# Patient Record
Sex: Female | Born: 1980 | Race: Black or African American | Hispanic: No | Marital: Married | Smoking: Current some day smoker
Health system: Southern US, Community
[De-identification: ages and names within clinical notes are randomized; demographics above are authoritative.]

## PROBLEM LIST (undated history)

## (undated) ENCOUNTER — Inpatient Hospital Stay (HOSPITAL_COMMUNITY): Payer: Self-pay

## (undated) DIAGNOSIS — F32A Depression, unspecified: Secondary | ICD-10-CM

## (undated) DIAGNOSIS — E0590 Thyrotoxicosis, unspecified without thyrotoxic crisis or storm: Secondary | ICD-10-CM

## (undated) DIAGNOSIS — D649 Anemia, unspecified: Secondary | ICD-10-CM

## (undated) DIAGNOSIS — E079 Disorder of thyroid, unspecified: Secondary | ICD-10-CM

## (undated) DIAGNOSIS — F329 Major depressive disorder, single episode, unspecified: Secondary | ICD-10-CM

## (undated) DIAGNOSIS — R519 Headache, unspecified: Secondary | ICD-10-CM

## (undated) DIAGNOSIS — E039 Hypothyroidism, unspecified: Secondary | ICD-10-CM

## (undated) DIAGNOSIS — F4310 Post-traumatic stress disorder, unspecified: Secondary | ICD-10-CM

## (undated) DIAGNOSIS — E119 Type 2 diabetes mellitus without complications: Secondary | ICD-10-CM

## (undated) DIAGNOSIS — Z87442 Personal history of urinary calculi: Secondary | ICD-10-CM

## (undated) DIAGNOSIS — O24419 Gestational diabetes mellitus in pregnancy, unspecified control: Secondary | ICD-10-CM

## (undated) HISTORY — PX: HAND SURGERY: SHX662

## (undated) HISTORY — DX: Post-traumatic stress disorder, unspecified: F43.10

## (undated) HISTORY — PX: OOPHORECTOMY: SHX86

## (undated) HISTORY — PX: DILATION AND CURETTAGE OF UTERUS: SHX78

## (undated) HISTORY — PX: SALPINGECTOMY: SHX328

---

## 2015-07-20 ENCOUNTER — Encounter (HOSPITAL_COMMUNITY): Payer: Self-pay

## 2015-07-20 ENCOUNTER — Emergency Department (HOSPITAL_COMMUNITY): Payer: Self-pay

## 2015-07-20 DIAGNOSIS — R110 Nausea: Secondary | ICD-10-CM | POA: Insufficient documentation

## 2015-07-20 DIAGNOSIS — Z8639 Personal history of other endocrine, nutritional and metabolic disease: Secondary | ICD-10-CM | POA: Insufficient documentation

## 2015-07-20 DIAGNOSIS — R0789 Other chest pain: Secondary | ICD-10-CM | POA: Insufficient documentation

## 2015-07-20 DIAGNOSIS — F17200 Nicotine dependence, unspecified, uncomplicated: Secondary | ICD-10-CM | POA: Insufficient documentation

## 2015-07-20 DIAGNOSIS — Z862 Personal history of diseases of the blood and blood-forming organs and certain disorders involving the immune mechanism: Secondary | ICD-10-CM | POA: Insufficient documentation

## 2015-07-20 LAB — BASIC METABOLIC PANEL
Anion gap: 5 (ref 5–15)
BUN: 5 mg/dL — AB (ref 6–20)
CHLORIDE: 112 mmol/L — AB (ref 101–111)
CO2: 25 mmol/L (ref 22–32)
Calcium: 11.2 mg/dL — ABNORMAL HIGH (ref 8.9–10.3)
Creatinine, Ser: 0.85 mg/dL (ref 0.44–1.00)
GFR calc Af Amer: >60 mL/min (ref >60)
GFR calc non Af Amer: >60 mL/min (ref >60)
GLUCOSE: 111 mg/dL — AB (ref 65–99)
POTASSIUM: 3.7 mmol/L (ref 3.5–5.1)
Sodium: 142 mmol/L (ref 135–145)

## 2015-07-20 LAB — CBC
HEMATOCRIT: 35.4 % — AB (ref 36.0–46.0)
HEMOGLOBIN: 11.8 g/dL — AB (ref 12.0–15.0)
MCH: 31.1 pg (ref 26.0–34.0)
MCHC: 33.3 g/dL (ref 30.0–36.0)
MCV: 93.2 fL (ref 78.0–100.0)
Platelets: 275 10*3/uL (ref 150–400)
RBC: 3.8 MIL/uL — ABNORMAL LOW (ref 3.87–5.11)
RDW: 12.5 % (ref 11.5–15.5)
WBC: 6.8 10*3/uL (ref 4.0–10.5)

## 2015-07-20 LAB — I-STAT TROPONIN, ED: Troponin i, poc: 0 ng/mL (ref 0.00–0.08)

## 2015-07-20 NOTE — ED Notes (Signed)
Pt here for central chest pain onset tonight 6pm while she was at work "packing personal items"; pain radiates to back and abdomen; pain worse with movement. Pt endorses nausea but denies vomiting. Denies SOB.

## 2015-07-20 NOTE — ED Notes (Signed)
Called for vital sign reassessment. No answer.

## 2015-07-21 ENCOUNTER — Emergency Department (HOSPITAL_COMMUNITY)
Admission: EM | Admit: 2015-07-21 | Discharge: 2015-07-21 | Disposition: A | Discharge status: Home / Self Care | Payer: Self-pay | Attending: Emergency Medicine | Admitting: Emergency Medicine

## 2015-07-21 DIAGNOSIS — M791 Myalgia, unspecified site: Secondary | ICD-10-CM

## 2015-07-21 DIAGNOSIS — R0789 Other chest pain: Secondary | ICD-10-CM

## 2015-07-21 HISTORY — DX: Anemia, unspecified: D64.9

## 2015-07-21 HISTORY — DX: Disorder of thyroid, unspecified: E07.9

## 2015-07-21 HISTORY — DX: Thyrotoxicosis, unspecified without thyrotoxic crisis or storm: E05.90

## 2015-07-21 MED ORDER — CYCLOBENZAPRINE HCL 5 MG PO TABS: 5.0000 mg | ORAL_TABLET | Freq: Three times a day (TID) | ORAL | Status: DC | PRN

## 2015-07-21 MED ORDER — CYCLOBENZAPRINE HCL 10 MG PO TABS
5.0000 mg | ORAL_TABLET | Freq: Once | ORAL | Status: AC
  Administered 2015-07-21: 5 mg via ORAL
  Filled 2015-07-21: qty 1

## 2015-07-21 MED ORDER — NAPROXEN 500 MG PO TABS: ORAL_TABLET | ORAL | Status: DC

## 2015-07-21 MED ORDER — KETOROLAC TROMETHAMINE 30 MG/ML IJ SOLN
30.0000 mg | Freq: Once | INTRAMUSCULAR | Status: AC
  Administered 2015-07-21: 30 mg via INTRAVENOUS
  Filled 2015-07-21: qty 1

## 2015-07-21 NOTE — Discharge Instructions (Signed)
Use ice and heat to the sore places. Take the medications as prescribed. You need to try to get a local doctor.  Recheck if you get a fever, vomiting or seem worse.     Emergency Department Resource Guide 1) Find a Doctor and Pay Out of Pocket Although you won't have to find out who is covered by your insurance plan, it is a good idea to ask around and get recommendations. You will then need to call the office and see if the doctor you have chosen will accept you as a new patient and what types of options they offer for patients who are self-pay. Some doctors offer discounts or will set up payment plans for their patients who do not have insurance, but you will need to ask so you aren't surprised when you get to your appointment.  2) Contact Your Local Health Department Not all health departments have doctors that can see patients for sick visits, but many do, so it is worth a call to see if yours does. If you don't know where your local health department is, you can check in your phone book. The CDC also has a tool to help you locate your state's health department, and many state websites also have listings of all of their local health departments.  3) Find a [facility] If your illness is not likely to be very severe or complicated, you may want to try a walk in clinic. These are popping up all over the country in pharmacies, drugstores, and shopping centers. They're usually staffed by nurse practitioners or physician assistants that have been trained to treat common illnesses and complaints. They're usually fairly quick and inexpensive. However, if you have serious medical issues or chronic medical problems, these are probably not your best option.  No Primary Care Doctor: - Call Health Connect at  [phone] - they can help you locate a primary care doctor that  accepts your insurance, provides certain services, etc. - Physician Referral Service- [phone]  Chronic Pain  Problems: Organization         Address  Phone   Notes  [facility]  [phone] Patients need to be referred by their primary care doctor.   Medication Assistance: Organization         Address  Phone   Notes  [facility] Medication [facility] [address] [place], [place] [address] [phone] --Must be a resident of [facility] -- Must have NO insurance coverage whatsoever (no Medicaid/ Medicare, etc.) -- The pt. MUST have a primary care doctor that directs their care regularly and follows them in the community   [facility]  [phone]   [employer]  [phone]    Agencies that provide inexpensive medical care: Organization         Address  Phone   Notes  [place]  [phone]   [facility]    [phone]   [facility] [place], [place] [address] [phone]   [place] [address] [place] [phone]   [facility]anned Parenthood    [phone]   [facility]    [phone]   [place] and [address], [place] Phone:  [phone], Fax:  [phone] Hours of Operation:  9 am - 6 pm, M-F.  Also accepts Medicaid/Medicare and self-pay.  [facility]  [address], [place] Phone: [phone], Fax: [phone]. Hours of Operation:  8:30 am - 5:30 pm, M-F.  Also accepts Medicaid and self-pay.  [facility] [address] [place] Phone: [phone]   [place], [place], [place] [phone], Ext. [phone] Mondays & Thursdays: 7-9 AM.  First 15 patients are seen on a first come, first serve basis.    [place] Providers:  Organization         Address  Phone   Notes  [facility] [address], [place] [phone] Also  accepts self-pay patients.  [facility] [address], [place]  [phone]   [place], [address], [place] [phone]   [facility] [address] [place] [phone]   Lucianne Lei [address], [place]   [phone] Only accepts [place] Access [place] patients after they have their name applied to their card.   Self-Pay (no insurance) in [facility]:  Organization         Address  Phone   Notes  Sickle Cell Patients, [facility] [place] [phone]   [facility] [place] [phone]   [facility] [place]  [place], [place], [place] [phone]   [facility]/Dr. Osei-Bonsu  [address], [place] or [address], [place] [phone] Phone number for both [place] and [place] locations is the same.  [facility] [address] [place] [phone]   [facility] [address] [place] or [address] [phone]   [facility] [address] [place] [phone], phone; [phone], fax Sees patients 1st and 3rd Saturday of every month.  Must not qualify for public or private insurance (i.e. Medicaid, Medicare, [place] Health Choice, Veterans' Benefits)  Household income should be no more than 200% of the poverty level The clinic cannot treat you if you are pregnant or think you are pregnant  Sexually transmitted diseases are not treated at the clinic.    Dental Care: Organization         Address  Phone  Notes  [facility] Department of [facility] [phone] Accepts children up to age 64 who are enrolled in [place] or [place]; pregnant  women with a Medicaid card; and children who have applied for Medicaid or [place] Health Choice, but were declined, whose parents can pay a reduced fee at time of service.  [facility] Department of [facility]  [address], [place] [phone] Accepts children up to age 74 who are enrolled in [place] or [place]; pregnant women with a Medicaid card; and children who have applied for Medicaid or [place] Health Choice, but were declined, whose parents can pay a reduced fee at time of service.  [place] [facility]  [place] [phone] Patients are seen by appointment only. Walk-ins are not accepted. [place] will see patients 14 years of age and older. Monday - Tuesday (8am-5pm) Most Wednesdays (8:30-5pm) $30 per visit, cash only  [place] [facility]  [address], [facility] [phone] Patients are seen by appointment only. Walk-ins are not accepted. [place] will see patients 26 years of age and older. One Wednesday Evening (Monthly: Volunteer Based).  $30 per visit, cash only  [place]  [phone] for adults; Children under age 34, call Graduate Pediatric Dentistry at [phone]. Children aged 82-14, please call [phone] to request a pediatric application.  Dental services are provided in all areas of dental care including fillings, crowns and bridges, complete and partial dentures, implants, gum treatment, root canals, and extractions. Preventive care is also provided. Treatment is provided to both adults and children. Patients are selected via a lottery and there is often a waiting list.   [facility] [address], [place]  [phone] [email or web address]   [facility] [address] [place], [place] [phone], Ext. [phone] Second and Fourth Thursday of each month, opens at 6:30 AM; Clinic ends at 9 AM.  Patients are  seen on a first-come first-served basis, and a limited number are seen during each clinic.   [facility]  [address] [place], [place] [phone]   Eligibility Requirements You must have lived in [place], [place], or [place] counties for at least the last three months.   You cannot be eligible for state or federal sponsored [employer], including [employer], [place], or [employer].   You generally cannot be eligible for healthcare insurance through your employer.    How to apply: Eligibility screenings are held every Tuesday and Wednesday afternoon from 1:00 pm until 4:00 pm. You do not need an appointment for the interview!  [facility] [address] [place], [place]   [place]  [place]  [place]  [phone]

## 2015-07-21 NOTE — ED Provider Notes (Signed)
CSN: [id number]     Arrival date & time 07/20/15  2006 History  By signing my name below, I, Randa Evens, attest that this documentation has been prepared under the direction and in the presence of Rolland Porter, MD at [place] [facility]. Electronically Signed: Randa Evens, ED Scribe. 07/21/2015. 2:15 AM.    Chief Complaint  Patient presents with  . Chest Pain    The history is provided by the patient. No language interpreter was used.   HPI Comments: Theresa Mccoy is a 35 y.o. female who presents to the Emergency Department complaining of constant sharp CP onset the evening of the 13th at 6:10PM that began after eating. She states she ate french fries with jalapeno peppers which is something she's eaten before although she states she normally does not eat during her break at work which is what she did tonight. Pt states that the pain radiates straight to her back and down into her abdomen and involves her whole abdomen. She states that any type of movement make the pain worse. She states that rest provides some relief. Pt reports some nausea. Pt denies any medications PTA. She denies vomiting, diarrhea, constipation, SOB or cough. She reports Hx of similar pain when having a miscarriage. She denies chance of pregnancy today. Patient is G4P2A2 (1 ectopic, one miscarriage). LMP around the first week of January. Pt reports smoking 1 pack of cigarettes each week.   Pt states that she she was prescribed, Remeron, lasix and tylenol # 5 but has not been complaint with the medications with them since October 2016. 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 She states the Lasix with for swelling. She states his medications were prescribed by her physician prior to moving to [facility] in May.  PCP none  Past Medical History  Diagnosis Date  . Thyroid disease   . Hyperthyroidism   . Anemia    Past Surgical History  Procedure Laterality Date  . Cesarean section     . Hand surgery Left    No family history on file. Social History  Substance Use Topics  . Smoking status: Current Every Day Smoker  . Smokeless tobacco: None  . Alcohol Use: Yes  employed Patient smokes 1 pack per week  OB History    No data available      Review of Systems  Respiratory: Negative for cough and shortness of breath.   Cardiovascular: Positive for chest pain.  Gastrointestinal: Positive for nausea. Negative for vomiting, diarrhea and constipation.  All other systems reviewed and are negative.    Allergies  Codeine  Home Medications   Prior to Admission medications   Medication Sig Start Date End Date Taking? Authorizing Provider  cyclobenzaprine (FLEXERIL) 5 MG tablet Take 1 tablet (5 mg total) by mouth 3 (three) times daily as needed for muscle spasms. 07/21/15   Rolland Porter, MD  naproxen (NAPROSYN) 500 MG tablet Take 1 po BID with food prn pain 07/21/15   Rolland Porter, MD   ED Triage Vitals  Enc Vitals Group     BP 07/20/15 2018 148/108 mmHg     Pulse Rate 07/20/15 2018 69     Resp 07/20/15 2018 16     Temp 07/20/15 2018 98.1 F (36.7 C)     Temp Source 07/20/15 2018 Oral     SpO2 07/20/15 2018 99 %     Weight --      Height --  Head Cir --      Peak Flow --      Pain Score 07/20/15 2014 10     Pain Loc --      Pain Edu? --      Excl. in [place]? --      Vital signs normal except for diastolic hypertension    Physical Exam  Constitutional: She is oriented to person, place, and time. She appears well-developed and well-nourished.  Non-toxic appearance. She does not appear ill. No distress.  HENT:  Head: Normocephalic and atraumatic.  Right Ear: External ear normal.  Left Ear: External ear normal.  Nose: Nose normal. No mucosal edema or rhinorrhea.  Mouth/Throat: Oropharynx is clear and moist and mucous membranes are normal. No dental abscesses or uvula swelling.  Eyes: Conjunctivae and EOM are normal. Pupils are equal, round, and reactive to  light.  Neck: Normal range of motion and full passive range of motion without pain. Neck supple.  Cardiovascular: Normal rate, regular rhythm and normal heart sounds.  Exam reveals no gallop and no friction rub.   No murmur heard. Pulmonary/Chest: Effort normal and breath sounds normal. No respiratory distress. She has no wheezes. She has no rhonchi. She has no rales. She exhibits no tenderness and no crepitus.  Abdominal: Soft. Normal appearance and bowel sounds are normal. She exhibits no distension. There is no tenderness. There is no rebound and no guarding.  Musculoskeletal: Normal range of motion. She exhibits no edema or tenderness.  Moves all extremities well.   Neurological: She is alert and oriented to person, place, and time. She has normal strength. No cranial nerve deficit.  Skin: Skin is warm, dry and intact. No rash noted. No erythema. No pallor.  Psychiatric: She has a normal mood and affect. Her speech is normal and behavior is normal. Her mood appears not anxious.  Nursing note and vitals reviewed.   ED Course  Procedures (including critical care time)  Medications  ketorolac (TORADOL) 30 MG/ML injection 30 mg (30 mg Intravenous Given 07/21/15 0228)  cyclobenzaprine (FLEXERIL) tablet 5 mg (5 mg Oral Given 07/21/15 0228)    DIAGNOSTIC STUDIES: Oxygen Saturation is 99% on RA, normal by my interpretation.    COORDINATION OF CARE: 2:14 AM-Discussed treatment plan with pt at bedside and pt agreed to plan.   Patient was given IV Toradol and oral Flexeril for her pain. She slept during the rest of her ED visit.  I waking the patient at time of discharge. She has no pain to palpation of her abdomen. Her chest is mildly tender. She states she only has pain when she moves. She has no pain when she's lying still.  Labs Review Results for orders placed or performed during the hospital encounter of [id number]  Basic metabolic panel  Result Value Ref Range   Sodium 142 135 - 145  mmol/L   Potassium 3.7 3.5 - 5.1 mmol/L   Chloride 112 (H) 101 - 111 mmol/L   CO2 25 22 - 32 mmol/L   Glucose, Bld 111 (H) 65 - 99 mg/dL   BUN 5 (L) 6 - 20 mg/dL   Creatinine, Ser 0.85 0.44 - 1.00 mg/dL   Calcium 11.2 (H) 8.9 - 10.3 mg/dL   GFR calc non Af Amer >60 >60 mL/min   GFR calc Af Amer >60 >60 mL/min   Anion gap 5 5 - 15  CBC  Result Value Ref Range   WBC 6.8 4.0 - 10.5 K/uL   RBC 3.80 (L)  3.87 - 5.11 MIL/uL   Hemoglobin 11.8 (L) 12.0 - 15.0 g/dL   HCT 35.4 (L) 36.0 - 46.0 %   MCV 93.2 78.0 - 100.0 fL   MCH 31.1 26.0 - 34.0 pg   MCHC 33.3 30.0 - 36.0 g/dL   RDW 12.5 11.5 - 15.5 %   Platelets 275 150 - 400 K/uL  I-stat troponin, ED (not at [facility], [facility])  Result Value Ref Range   Troponin i, poc 0.00 0.00 - 0.08 ng/mL   Comment 3           Laboratory interpretation all normal except mild anemia     Imaging Review Dg Chest 2 View  07/20/2015  CLINICAL DATA:  Chest pain and nausea EXAM: CHEST  2 VIEW COMPARISON:  None. FINDINGS: Normal mediastinum and cardiac silhouette. Normal pulmonary vasculature. No evidence of effusion, infiltrate, or pneumothorax. No acute bony abnormality. IMPRESSION: Normal chest radiograph Electronically Signed   By: Suzy Bouchard M.D.   On: 07/20/2015 21:22   I have personally reviewed and evaluated these images and lab results as part of my medical decision-making.   EKG Interpretation   Date/Time:  Friday July 20 2015 20:19:28 EST Ventricular Rate:  70 PR Interval:  150 QRS Duration: 80 QT Interval:  368 QTC Calculation: 397 R Axis:   66 Text Interpretation:  Normal sinus rhythm with sinus arrhythmia Normal ECG  No old tracing to compare Confirmed by Kemi Gell  MD-I, Abdulkarim Eberlin ([id number]) on  07/21/2015 2:16:12 AM      MDM   Final diagnoses:  Muscle pain  Musculoskeletal chest pain    Plan discharge  Rolland Porter, MD, FACEP   I personally performed the services described in this documentation, which was scribed in my presence. The  recorded information has been reviewed and considered.  Rolland Porter, MD, Barbette Or, MD 07/21/15 [phone]

## 2015-07-21 NOTE — ED Notes (Signed)
The pt reports no pain at present

## 2015-07-21 NOTE — ED Notes (Signed)
Pt sleeping. 

## 2015-07-21 NOTE — ED Notes (Signed)
Pt still sleeping...

## 2016-11-09 ENCOUNTER — Encounter (HOSPITAL_COMMUNITY): Payer: Self-pay

## 2016-11-09 ENCOUNTER — Emergency Department (HOSPITAL_COMMUNITY)
Admission: EM | Admit: 2016-11-09 | Discharge: 2016-11-09 | Disposition: A | Discharge status: Home / Self Care | Payer: Self-pay | Attending: Emergency Medicine | Admitting: Emergency Medicine

## 2016-11-09 DIAGNOSIS — F17210 Nicotine dependence, cigarettes, uncomplicated: Secondary | ICD-10-CM | POA: Insufficient documentation

## 2016-11-09 DIAGNOSIS — Z79899 Other long term (current) drug therapy: Secondary | ICD-10-CM | POA: Insufficient documentation

## 2016-11-09 DIAGNOSIS — R21 Rash and other nonspecific skin eruption: Secondary | ICD-10-CM | POA: Insufficient documentation

## 2016-11-09 LAB — POC URINE PREG, ED: Preg Test, Ur: NEGATIVE

## 2016-11-09 MED ORDER — DIPHENHYDRAMINE HCL 25 MG PO CAPS
50.0000 mg | ORAL_CAPSULE | Freq: Once | ORAL | Status: AC
  Administered 2016-11-09: 50 mg via ORAL
  Filled 2016-11-09: qty 2

## 2016-11-09 MED ORDER — FLUCONAZOLE 100 MG PO TABS
300.0000 mg | ORAL_TABLET | Freq: Once | ORAL | Status: AC
  Administered 2016-11-09: 300 mg via ORAL
  Filled 2016-11-09: qty 3

## 2016-11-09 MED ORDER — FLUCONAZOLE 200 MG PO TABS: 300.0000 mg | ORAL_TABLET | Freq: Once | ORAL | 0 refills | Status: AC

## 2016-11-09 MED ORDER — IBUPROFEN 400 MG PO TABS
600.0000 mg | ORAL_TABLET | Freq: Once | ORAL | Status: AC
Start: 2016-11-09 — End: 2016-11-09
  Administered 2016-11-09: 600 mg via ORAL
  Filled 2016-11-09: qty 1

## 2016-11-09 NOTE — ED Triage Notes (Signed)
Onset 5 months abd, leg, fet pain.  Legs will lock up sometimes while pt is walking.  Onset "years" widespread itchy rash, brown spots and when pt scratches skin will peel.

## 2016-11-09 NOTE — ED Notes (Signed)
Patient urinated right before preg test was ordered.  Drinking water at this time.

## 2016-11-10 NOTE — ED Provider Notes (Signed)
[facility] DEPT Provider Note   CSN: [id number] Arrival date & time: 11/09/16  9449     History   Chief Complaint Chief Complaint  Patient presents with  . generalized pain  . Rash    HPI Theresa Mccoy is a 36 y.o. female.  The history is provided by the patient.  Rash   This is a chronic problem. Episode onset: worsening over the past year. The problem has been gradually worsening. The problem is associated with nothing. There has been no fever. The rash is present on the torso (legs and arms). The patient is experiencing no pain. The pain has been constant since onset. Associated symptoms include itching. She has tried steriods for the symptoms. The treatment provided no relief.    Past Medical History:  Diagnosis Date  . Anemia   . Hyperthyroidism   . Thyroid disease     There are no active problems to display for this patient.   Past Surgical History:  Procedure Laterality Date  . CESAREAN SECTION    . HAND SURGERY Left     OB History    No data available       Home Medications    Prior to Admission medications   Medication Sig Start Date End Date Taking? Authorizing Provider  acetaminophen (TYLENOL) 500 MG tablet Take 500 mg by mouth 2 (two) times daily.   Yes [provider]  atenolol (TENORMIN) 50 MG tablet Take 50 mg by mouth daily.   Yes [provider]  Calcium Polycarbophil (FIBER) 625 MG TABS Take 625 mg by mouth daily.   Yes [provider]  loratadine (CLARITIN) 10 MG tablet Take 10 mg by mouth daily.   Yes [provider]  meloxicam (MOBIC) 7.5 MG tablet Take 7.5 mg by mouth 2 (two) times daily.   Yes [provider]  senna (SENOKOT) 8.6 MG TABS tablet Take 1 tablet by mouth daily.   Yes [provider]  cyclobenzaprine (FLEXERIL) 5 MG tablet Take 1 tablet (5 mg total) by mouth 3 (three) times daily as needed for muscle spasms. Patient not taking: Reported on 11/09/2016 07/21/15   Rolland Porter, MD  fluconazole (DIFLUCAN) 200 MG tablet Take 1.5 tablets (300 mg total) by mouth once. Take 1 week after first dose if not improved 11/16/16 11/16/16  Heriberto Antigua, MD  naproxen (NAPROSYN) 500 MG tablet Take 1 po BID with food prn pain Patient not taking: Reported on 11/09/2016 07/21/15   Rolland Porter, MD    Family History History reviewed. No pertinent family history.  Social History Social History  Substance Use Topics  . Smoking status: Current Every Day Smoker    Packs/day: 0.10    Types: Cigarettes  . Smokeless tobacco: Never Used  . 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  HENT: Negative.   Respiratory: Negative for cough and shortness of breath.   Cardiovascular: Negative for chest pain and leg swelling.  Gastrointestinal: Negative for diarrhea, nausea and vomiting.       Diffuse abdominal pain, leg pain, and feet pain for about 5 months  Genitourinary: Negative.   Musculoskeletal: Positive for back pain.  Skin: Positive for itching and rash.  Neurological: Negative.   All other systems reviewed and are negative.    Physical Exam Updated Vital Signs BP (!) 145/95   Pulse 67  Temp 98.7 F (37.1 C) (Oral)   Resp (!) 23   LMP 10/12/2016   SpO2 100%   Physical Exam  Constitutional: She is oriented to person, place, and time. She appears well-developed and well-nourished. No distress.  HENT:  Head: Normocephalic and atraumatic.  Eyes: Conjunctivae and EOM are normal.  Neck: Neck supple.  Cardiovascular: Normal rate and regular rhythm.   No murmur heard. Pulmonary/Chest: Effort normal and breath sounds normal. No respiratory distress.  Abdominal: Soft. She exhibits no distension. There is no tenderness. There is no rebound and no guarding.  Musculoskeletal: Normal range of motion. She exhibits no edema.  Neurological: She is alert and oriented to  person, place, and time. No sensory deficit. Coordination normal.  Skin: Skin is warm and dry. Rash (diffuse rash with some peeling and color change patches) noted.  Psychiatric: She has a normal mood and affect.  Nursing note and vitals reviewed.    ED Treatments / Results  Labs (all labs ordered are listed, but only abnormal results are displayed) Labs Reviewed  POC URINE PREG, ED    EKG  EKG Interpretation None       Radiology No results found.  Procedures Procedures (including critical care time)  Medications Ordered in ED Medications  ibuprofen (ADVIL,MOTRIN) tablet 600 mg (600 mg Oral Given 11/09/16 2029)  diphenhydrAMINE (BENADRYL) capsule 50 mg (50 mg Oral Given 11/09/16 2029)  fluconazole (DIFLUCAN) tablet 300 mg (300 mg Oral Given 11/09/16 2029)     Initial Impression / Assessment and Plan / ED Course  I have reviewed the triage vital signs and the nursing notes.  Pertinent labs & imaging results that were available during my care of the patient were reviewed by me and considered in my medical decision making (see chart for details).     Patient is a 36 year old female with no significant past medical history presents with skin burning, itching, rash that has been worsening over the past year as well as generalized pain. Further history and exam as above with reassuring vital signs and rash diffusely which appears to be consistent with tinea versicolor. No significant traumatic findings or signs of trauma. Pregnancy test negative. Given a dose of fluconazole here so symptomatic management.  Patient stable for discharge home.  I have reviewed all results with the patient. Advised to f/u with pcp for further management. Will rx fluconazole for 1 week if symptoms not improved. Patient agrees to stated plan. All questions answered. Advised to call or return to have any questions, new symptoms, change in symptoms, or symptoms that they do not understand.   Final Clinical  Impressions(s) / ED Diagnoses   Final diagnoses:  Rash    New Prescriptions Discharge Medication List as of 11/09/2016  9:19 PM    START taking these medications   Details  fluconazole (DIFLUCAN) 200 MG tablet Take 1.5 tablets (300 mg total) by mouth once. Take 1 week after first dose if not improved, Starting Sun 11/16/2016, Print         Heriberto Antigua, MD 11/10/16 1603    Elnora Morrison, MD 11/16/16 [phone]

## 2016-12-08 ENCOUNTER — Inpatient Hospital Stay (INDEPENDENT_AMBULATORY_CARE_PROVIDER_SITE_OTHER): Payer: Self-pay | Admitting: Physician Assistant

## 2017-01-08 ENCOUNTER — Emergency Department (HOSPITAL_COMMUNITY)
Admission: EM | Admit: 2017-01-08 | Discharge: 2017-01-08 | Disposition: A | Discharge status: Home / Self Care | Payer: Medicaid Other | Attending: Emergency Medicine | Admitting: Emergency Medicine

## 2017-01-08 DIAGNOSIS — R110 Nausea: Secondary | ICD-10-CM | POA: Insufficient documentation

## 2017-01-08 DIAGNOSIS — E039 Hypothyroidism, unspecified: Secondary | ICD-10-CM | POA: Diagnosis not present

## 2017-01-08 DIAGNOSIS — N941 Unspecified dyspareunia: Secondary | ICD-10-CM | POA: Insufficient documentation

## 2017-01-08 DIAGNOSIS — Z79899 Other long term (current) drug therapy: Secondary | ICD-10-CM | POA: Diagnosis not present

## 2017-01-08 DIAGNOSIS — R109 Unspecified abdominal pain: Secondary | ICD-10-CM | POA: Insufficient documentation

## 2017-01-08 DIAGNOSIS — F17210 Nicotine dependence, cigarettes, uncomplicated: Secondary | ICD-10-CM | POA: Insufficient documentation

## 2017-01-08 DIAGNOSIS — R200 Anesthesia of skin: Secondary | ICD-10-CM | POA: Insufficient documentation

## 2017-01-08 DIAGNOSIS — I10 Essential (primary) hypertension: Secondary | ICD-10-CM | POA: Diagnosis not present

## 2017-01-08 DIAGNOSIS — G8929 Other chronic pain: Secondary | ICD-10-CM | POA: Diagnosis not present

## 2017-01-08 DIAGNOSIS — M545 Low back pain: Secondary | ICD-10-CM | POA: Diagnosis present

## 2017-01-08 LAB — COMPREHENSIVE METABOLIC PANEL
ALT: 10 U/L — ABNORMAL LOW (ref 14–54)
ANION GAP: 4 — AB (ref 5–15)
AST: 14 U/L — ABNORMAL LOW (ref 15–41)
Albumin: 4.6 g/dL (ref 3.5–5.0)
Alkaline Phosphatase: 88 U/L (ref 38–126)
BUN: 8 mg/dL (ref 6–20)
CALCIUM: 11.5 mg/dL — AB (ref 8.9–10.3)
CO2: 22 mmol/L (ref 22–32)
Chloride: 110 mmol/L (ref 101–111)
Creatinine, Ser: 0.7 mg/dL (ref 0.44–1.00)
Glucose, Bld: 102 mg/dL — ABNORMAL HIGH (ref 65–99)
POTASSIUM: 3.7 mmol/L (ref 3.5–5.1)
Sodium: 136 mmol/L (ref 135–145)
TOTAL PROTEIN: 7.7 g/dL (ref 6.5–8.1)
Total Bilirubin: 0.4 mg/dL (ref 0.3–1.2)

## 2017-01-08 LAB — URINALYSIS, ROUTINE W REFLEX MICROSCOPIC
BILIRUBIN URINE: NEGATIVE
GLUCOSE, UA: NEGATIVE mg/dL
Ketones, ur: NEGATIVE mg/dL
LEUKOCYTES UA: NEGATIVE
NITRITE: NEGATIVE
PROTEIN: NEGATIVE mg/dL
Specific Gravity, Urine: 1.01 (ref 1.005–1.030)
pH: 6 (ref 5.0–8.0)

## 2017-01-08 LAB — CBC
HEMATOCRIT: 36.9 % (ref 36.0–46.0)
HEMOGLOBIN: 12.6 g/dL (ref 12.0–15.0)
MCH: 31.3 pg (ref 26.0–34.0)
MCHC: 34.1 g/dL (ref 30.0–36.0)
MCV: 91.6 fL (ref 78.0–100.0)
Platelets: 271 10*3/uL (ref 150–400)
RBC: 4.03 MIL/uL (ref 3.87–5.11)
RDW: 12.5 % (ref 11.5–15.5)
WBC: 10.2 10*3/uL (ref 4.0–10.5)

## 2017-01-08 LAB — WET PREP, GENITAL
CLUE CELLS WET PREP: NONE SEEN
SPERM: NONE SEEN
Trich, Wet Prep: NONE SEEN
WBC WET PREP: NONE SEEN
Yeast Wet Prep HPF POC: NONE SEEN

## 2017-01-08 LAB — I-STAT BETA HCG BLOOD, ED (MC, WL, AP ONLY): I-stat hCG, quantitative: <5 m[IU]/mL (ref <5)

## 2017-01-08 LAB — LIPASE, BLOOD: Lipase: 22 U/L (ref 11–51)

## 2017-01-08 NOTE — ED Provider Notes (Addendum)
[facility] DEPT Provider Note   CSN: [id number] Arrival date & time: 01/08/17  1155     History   Chief Complaint Chief Complaint  Patient presents with  . Abdominal Pain  . Back Pain    HPI Theresa Mccoy is a 36 y.o. female.Complaining of lower abdominal pain for 5 months, unchanged today. Pain is nonradiating. Also complains of low back pain. No treatment prior to coming here. Nothing makes symptoms better or worse. She reports irregular menses. Her last missed her period was "pink" she denies fever denies change in appetite. Nothing makes symptoms better or worse. No treatment prior to coming here. Denies nausea or vomiting. Other associated symptoms include tingling in her feet when she stands for long time  HPI  Past Medical History:  Diagnosis Date  . Anemia   . Hyperthyroidism   . Thyroid disease   Hypertension Acid reflux There are no active problems to display for this patient.   Past Surgical History:  Procedure Laterality Date  . CESAREAN SECTION    . HAND SURGERY Left     OB History    No data available       Home Medications    Prior to Admission medications   Medication Sig Start Date End Date Taking? Authorizing Provider  loratadine (CLARITIN) 10 MG tablet Take 10 mg by mouth daily.   Yes [provider]  atenolol (TENORMIN) 50 MG tablet Take 50 mg by mouth daily.    [provider]  cyclobenzaprine (FLEXERIL) 5 MG tablet Take 1 tablet (5 mg total) by mouth 3 (three) times daily as needed for muscle spasms. Patient not taking: Reported on 11/09/2016 07/21/15   Rolland Porter, MD  meloxicam (MOBIC) 7.5 MG tablet Take 7.5 mg by mouth 2 (two) times daily.    [provider]    Family History No family history on file.  Social History Social History  Substance Use Topics  . Smoking status: Current Every Day Smoker    Packs/day: 0.10    Types: Cigarettes  . Smokeless tobacco: Never Used  . Alcohol use No     Comment:  sober since 02-2016     Allergies   Codeine   Review of Systems Review of Systems  Constitutional: Negative.   HENT: Negative.   Respiratory: Negative.   Cardiovascular: Negative.   Gastrointestinal: Positive for abdominal pain.  Genitourinary: Positive for dyspareunia. Negative for vaginal discharge.  Musculoskeletal: Positive for back pain.  Skin: Negative.   Neurological: Negative.          parasthsias in feet  Psychiatric/Behavioral: Negative.   All other systems reviewed and are negative.    Physical Exam Updated Vital Signs BP 112/81 (BP Location: Right Arm)   Pulse 74   Temp 98.1 F (36.7 C) (Oral)   Resp 16   LMP 12/31/2016   SpO2 100%   Physical Exam  Constitutional: She appears well-developed and well-nourished.  HENT:  Head: Normocephalic and atraumatic.  Eyes: Conjunctivae are normal. Pupils are equal, round, and reactive to light.  Neck: Neck supple. No tracheal deviation present. No thyromegaly present.  Cardiovascular: Normal rate and regular rhythm.   No murmur heard. Pulmonary/Chest: Effort normal and breath sounds normal.  Abdominal: Soft. Bowel sounds are normal. She exhibits no distension. There is no tenderness.  Genitourinary:  Genitourinary Comments: No external lesion no discharge in vault Pelvic exam no cervical motion tenderness. Cervical os closed. No adnexal tenderness or masses. She is mildly tenderat uterine fundus  Musculoskeletal: Normal range of motion. She exhibits no edema or tenderness.  Neurological: She is alert. Coordination normal.  Skin: Skin is warm and dry. No rash noted.  Psychiatric: She has a normal mood and affect.  Nursing note and vitals reviewed.    ED Treatments / Results  Labs (all labs ordered are listed, but only abnormal results are displayed) Labs Reviewed  COMPREHENSIVE METABOLIC PANEL - Abnormal; Notable for the following:       Result Value   Glucose, Bld 102 (*)    Calcium 11.5 (*)    AST 14 (*)     ALT 10 (*)    Anion gap 4 (*)    All other components within normal limits  URINALYSIS, ROUTINE W REFLEX MICROSCOPIC - Abnormal; Notable for the following:    Hgb urine dipstick SMALL (*)    Bacteria, UA FEW (*)    Squamous Epithelial / LPF 0-5 (*)    All other components within normal limits  WET PREP, GENITAL  LIPASE, BLOOD  CBC  RPR  HIV ANTIBODY (ROUTINE TESTING)  I-STAT BETA HCG BLOOD, ED ([facility], [facility], [facility] ONLY)  GC/CHLAMYDIA PROBE AMP ([facility]) NOT AT [facility]    EKG  EKG Interpretation None      Results for orders placed or performed during the hospital encounter of 01/08/17  Wet prep, genital  Result Value Ref Range   Yeast Wet Prep HPF POC NONE SEEN NONE SEEN   Trich, Wet Prep NONE SEEN NONE SEEN   Clue Cells Wet Prep HPF POC NONE SEEN NONE SEEN   WBC, Wet Prep HPF POC NONE SEEN NONE SEEN   Sperm NONE SEEN   Lipase, blood  Result Value Ref Range   Lipase 22 11 - 51 U/L  Comprehensive metabolic panel  Result Value Ref Range   Sodium 136 135 - 145 mmol/L   Potassium 3.7 3.5 - 5.1 mmol/L   Chloride 110 101 - 111 mmol/L   CO2 22 22 - 32 mmol/L   Glucose, Bld 102 (H) 65 - 99 mg/dL   BUN 8 6 - 20 mg/dL   Creatinine, Ser 0.70 0.44 - 1.00 mg/dL   Calcium 11.5 (H) 8.9 - 10.3 mg/dL   Total Protein 7.7 6.5 - 8.1 g/dL   Albumin 4.6 3.5 - 5.0 g/dL   AST 14 (L) 15 - 41 U/L   ALT 10 (L) 14 - 54 U/L   Alkaline Phosphatase 88 38 - 126 U/L   Total Bilirubin 0.4 0.3 - 1.2 mg/dL   GFR calc non Af Amer >60 >60 mL/min   GFR calc Af Amer >60 >60 mL/min   Anion gap 4 (L) 5 - 15  CBC  Result Value Ref Range   WBC 10.2 4.0 - 10.5 K/uL   RBC 4.03 3.87 - 5.11 MIL/uL   Hemoglobin 12.6 12.0 - 15.0 g/dL   HCT 36.9 36.0 - 46.0 %   MCV 91.6 78.0 - 100.0 fL   MCH 31.3 26.0 - 34.0 pg   MCHC 34.1 30.0 - 36.0 g/dL   RDW 12.5 11.5 - 15.5 %   Platelets 271 150 - 400 K/uL  Urinalysis, Routine w reflex microscopic  Result Value Ref Range   Color, Urine YELLOW YELLOW   APPearance CLEAR  CLEAR   Specific Gravity, Urine 1.010 1.005 - 1.030   pH 6.0 5.0 - 8.0   Glucose, UA NEGATIVE NEGATIVE mg/dL   Hgb urine dipstick SMALL (A) NEGATIVE   Bilirubin Urine NEGATIVE NEGATIVE  Ketones, ur NEGATIVE NEGATIVE mg/dL   Protein, ur NEGATIVE NEGATIVE mg/dL   Nitrite NEGATIVE NEGATIVE   Leukocytes, UA NEGATIVE NEGATIVE   RBC / HPF 0-5 0 - 5 RBC/hpf   WBC, UA 0-5 0 - 5 WBC/hpf   Bacteria, UA FEW (A) NONE SEEN   Squamous Epithelial / LPF 0-5 (A) NONE SEEN   Mucous PRESENT   I-Stat beta hCG blood, ED  Result Value Ref Range   I-stat hCG, quantitative <5.0 <5 mIU/mL   Comment 3           No results found.  Radiology No results found.  Procedures Procedures (including critical care time)  Medications Ordered in ED Medications - No data to display   Initial Impression / Assessment and Plan / ED Course  I have reviewed the triage vital signs and the nursing notes.  Pertinent labs & imaging results that were available during my care of the patient were reviewed by me and considered in my medical decision making (see chart for details).     415  Resting comfortably. No distress. Plan Tylenol or Advil for pain.STD panel pending Referral women's outpatient clinic. Safe sex encouraged Final Clinical Impressions(s) / ED Diagnoses  Diagnosis #1 chronic abdominal pain #2 chronic back pain Final diagnoses:  None    New Prescriptions New Prescriptions   No medications on file     Orlie Dakin, MD 01/08/17 1635    Orlie Dakin, MD 01/08/17 [phone]

## 2017-01-08 NOTE — ED Triage Notes (Signed)
Pt c/o diffuse abdominal discomfort, low back pain, intermitted nausea x 1 month, not improving. Pt adds feet get numb and tingly when pt stands for long period of time x 5 months.  Pt had unusually light period with mild light pink bleeding in June, menstrual period prior to that in April, lasted 1 day. Irregular periods since August 2017 Last normal menstrual period August 2017, pt's normal menstruation involves heavy bleeding with clots, lasting 10 days, occurring every 14 days.

## 2017-01-08 NOTE — Discharge Instructions (Signed)
Call the [facility] to schedule the next available appointment. Take Tylenol or Advil for pain. They will be able to check you for cancer, and help determine the cause of your abnormal periods. Use a condom each time that you have sex. You have been tested for HIV and other sexually transmitted diseases. You will be called if those tests are abnormal

## 2017-01-09 LAB — RPR: RPR Ser Ql: NONREACTIVE

## 2017-01-09 LAB — GC/CHLAMYDIA PROBE AMP (~~LOC~~) NOT AT ARMC
Chlamydia: NEGATIVE
NEISSERIA GONORRHEA: NEGATIVE

## 2017-01-09 LAB — HIV ANTIBODY (ROUTINE TESTING W REFLEX): HIV Screen 4th Generation wRfx: NONREACTIVE

## 2017-01-28 ENCOUNTER — Inpatient Hospital Stay (INDEPENDENT_AMBULATORY_CARE_PROVIDER_SITE_OTHER): Payer: Self-pay | Admitting: Physician Assistant

## 2017-02-02 ENCOUNTER — Encounter (INDEPENDENT_AMBULATORY_CARE_PROVIDER_SITE_OTHER): Payer: Self-pay | Admitting: Internal Medicine

## 2017-02-02 ENCOUNTER — Ambulatory Visit (INDEPENDENT_AMBULATORY_CARE_PROVIDER_SITE_OTHER): Payer: Self-pay | Admitting: Internal Medicine

## 2017-02-02 VITALS — BP 130/86 | HR 68 | Temp 98.2°F | Ht 60.63 in | Wt 155.2 lb

## 2017-02-02 DIAGNOSIS — N342 Other urethritis: Secondary | ICD-10-CM

## 2017-02-02 DIAGNOSIS — I10 Essential (primary) hypertension: Secondary | ICD-10-CM

## 2017-02-02 DIAGNOSIS — R1084 Generalized abdominal pain: Secondary | ICD-10-CM | POA: Insufficient documentation

## 2017-02-02 DIAGNOSIS — R252 Cramp and spasm: Secondary | ICD-10-CM | POA: Insufficient documentation

## 2017-02-02 DIAGNOSIS — F3162 Bipolar disorder, current episode mixed, moderate: Secondary | ICD-10-CM

## 2017-02-02 DIAGNOSIS — F411 Generalized anxiety disorder: Secondary | ICD-10-CM

## 2017-02-02 HISTORY — DX: Essential (primary) hypertension: I10

## 2017-02-02 LAB — POCT GLYCOSYLATED HEMOGLOBIN (HGB A1C): HEMOGLOBIN A1C: 5.3

## 2017-02-02 MED ORDER — MIRTAZAPINE 30 MG PO TABS: 30.0000 mg | ORAL_TABLET | Freq: Every day | ORAL | 3 refills | Status: DC

## 2017-02-02 MED ORDER — CIPROFLOXACIN HCL 500 MG PO TABS: 500.0000 mg | ORAL_TABLET | Freq: Two times a day (BID) | ORAL | 0 refills | Status: DC

## 2017-02-02 MED ORDER — CYCLOBENZAPRINE HCL 5 MG PO TABS: 5.0000 mg | ORAL_TABLET | Freq: Three times a day (TID) | ORAL | 2 refills | Status: DC | PRN

## 2017-02-02 MED ORDER — CYCLOBENZAPRINE HCL 5 MG PO TABS
5.0000 mg | ORAL_TABLET | Freq: Three times a day (TID) | ORAL | 2 refills | Status: DC | PRN
Start: 2017-02-02 — End: 2017-08-07

## 2017-02-02 MED ORDER — HYDROXYZINE HCL 25 MG PO TABS: 25.0000 mg | ORAL_TABLET | Freq: Three times a day (TID) | ORAL | 3 refills | Status: DC | PRN

## 2017-02-02 MED FILL — ?MIRTAZAPINE 30 MG TABLET: 30 | 30 days supply | Qty: 30 | Fill #0

## 2017-02-02 MED FILL — CIPROFLOXACIN HCL 500 MG TA: 500 | 7 days supply | Qty: 14 | Fill #0

## 2017-02-02 MED FILL — hydrOXYzine HCL 25 MG TABS: 25 | 30 days supply | Qty: 90 | Fill #0

## 2017-02-02 MED FILL — CYCLOBENZAPRINE 5 MG TABLET: 5 | 10 days supply | Qty: 30 | Fill #0

## 2017-02-02 NOTE — Progress Notes (Signed)
Theresa Mccoy, is a 36 y.o. female  JEH:[id number]  [id number]:[id number]  DOB - [birth year]  CC:  Chief Complaint  Patient presents with  . Hospitalization Follow-up    abdominal pain       HPI: Theresa Mccoy is a 36 y.o. female here today to establish medical care. She has history of anxiety and depression. She used to live in [place], recently relocated to [place], yet to establish care with psychiatrist or mental health dept here. She claims she use to see multiple medical specialists in [place] but we have no record at the moment. Patient does not know what medications she takes and has none at this time. She was seen in the ED recently for lower abdominal pain which she said was ongoing for about 5 months. She also complained of ongoing back pain. She claims her abdomen gets big and hard when she stands up but soft when lying down. She has no problem eating, she has no change in bowel habit, she agrees to some change in urine color and odor, but denies dysuria or frequency. Denies nausea or vomiting. She claims her depression is better now but she is anxious most of the time, no panic attack. She denies any suicidal ideation or thoughts. Patient has No headache, No chest pain, No new weakness tingling or numbness, No Cough - SOB.   Allergies  Allergen Reactions  . Codeine     "it makes me not be able to go to the bathroom on my own"   Past Medical History:  Diagnosis Date  . Anemia   . Hyperthyroidism   . Thyroid disease    Current Outpatient Prescriptions on File Prior to Visit  Medication Sig Dispense Refill  . atenolol (TENORMIN) 50 MG tablet Take 50 mg by mouth daily.    Marland Kitchen loratadine (CLARITIN) 10 MG tablet Take 10 mg by mouth daily.    . meloxicam (MOBIC) 7.5 MG tablet Take 7.5 mg by mouth 2 (two) times daily.     No current facility-administered medications on file prior to visit.    No family history on file. Social History   Social History  . Marital status:  Single    Spouse name: N/A  . Number of children: N/A  . Years of education: N/A   Occupational History  . Not on file.   Social History Main Topics  . Smoking status: Current Every Day Smoker    Packs/day: 0.10    Types: Cigarettes  . Smokeless tobacco: Never Used  . Alcohol use No     Comment: sober since 02-2016  . Drug use: Yes    Types: Marijuana  . Sexual activity: No   Other Topics Concern  . Not on file   Social History Narrative  . No narrative on file    Review of Systems: Constitutional: Negative for fever, chills, diaphoresis, activity change, appetite change and fatigue. HENT: Negative for ear pain, nosebleeds, congestion, facial swelling, rhinorrhea, neck pain, neck stiffness and ear discharge.  Eyes: Negative for pain, discharge, redness, itching and visual disturbance. Respiratory: Negative for cough, choking, chest tightness, shortness of breath, wheezing and stridor.  Cardiovascular: Negative for chest pain, palpitations and leg swelling. Gastrointestinal: Negative for abdominal distention. Genitourinary: Negative for dysuria, urgency, frequency, hematuria, flank pain, decreased urine volume, difficulty urinating and dyspareunia.  Musculoskeletal: Negative for back pain, joint swelling, arthralgia and gait problem. Neurological: Negative for dizziness, tremors, seizures, syncope, facial asymmetry, speech difficulty, weakness, light-headedness, numbness and headaches.  Hematological:  Negative for adenopathy. Does not bruise/bleed easily. Psychiatric/Behavioral: Negative for hallucinations, behavioral problems, confusion, dysphoric mood, decreased concentration and agitation.    Objective:   Vitals:   02/02/17 1404  BP: 130/86  Pulse: 68  Temp: 98.2 F (36.8 C)    Physical Exam: Constitutional: Patient appears well-developed and well-nourished. No distress. Poor Dentition HENT: Normocephalic, atraumatic, External right and left ear normal. Oropharynx  is clear and moist.  Eyes: Conjunctivae and EOM are normal. PERRLA, no scleral icterus. Neck: Normal ROM. Neck supple. No JVD. No tracheal deviation. No thyromegaly. CVS: RRR, S1/S2 +, no murmurs, no gallops, no carotid bruit.  Pulmonary: Effort and breath sounds normal, no stridor, rhonchi, wheezes, rales.  Abdominal: Soft. BS +, no distension, tenderness, rebound or guarding.  Musculoskeletal: Normal range of motion. No edema and no tenderness.  Lymphadenopathy: No lymphadenopathy noted, cervical, inguinal or axillary Neuro: Alert. Normal reflexes, muscle tone coordination. No cranial nerve deficit. Skin: Skin is warm and dry. No rash noted. Not diaphoretic. No erythema. No pallor. Psychiatric: Normal mood and affect. Behavior, judgment, thought content normal.  Lab Results  Component Value Date   WBC 10.2 01/08/2017   HGB 12.6 01/08/2017   HCT 36.9 01/08/2017   MCV 91.6 01/08/2017   PLT 271 01/08/2017   Lab Results  Component Value Date   CREATININE 0.70 01/08/2017   BUN 8 01/08/2017   NA 136 01/08/2017   K 3.7 01/08/2017   CL 110 01/08/2017   CO2 22 01/08/2017    No results found for: HGBA1C Lipid Panel  No results found for: CHOL, TRIG, HDL, CHOLHDL, VLDL, LDLCALC      Assessment and plan:   1. Muscle cramps  - TSH - cyclobenzaprine (FLEXERIL) 5 MG tablet; Take 1 tablet (5 mg total) by mouth 3 (three) times daily as needed for muscle spasms.  Dispense: 30 tablet; Refill: 2  2. Bipolar disorder, current episode mixed, moderate (HCC)  - TSH - Urinalysis, Complete - VITAMIN D 25 Hydroxy (Vit-D Deficiency, Fractures) - T4, Free Start - mirtazapine (REMERON) 30 MG tablet; Take 1 tablet (30 mg total) by mouth at bedtime.  Dispense: 30 tablet; Refill: 3  3. Generalized abdominal pain  - TSH - Urinalysis, Complete Start - hydrOXYzine (ATARAX/VISTARIL) 25 MG tablet; Take 1 tablet (25 mg total) by mouth 3 (three) times daily as needed.  Dispense: 90 tablet; Refill:  3  4. Generalized anxiety disorder  - TSH - POCT glycosylated hemoglobin (Hb A1C)  5. Essential hypertension  We have discussed target BP range and blood pressure goal. I have advised patient to check BP regularly and to call us back or report to clinic if the numbers are consistently higher than 140/90. We discussed the importance of compliance with medical therapy and DASH diet recommended, consequences of uncontrolled hypertension discussed.  - continue current BP medications  5. Infective Urethritis  - Ciprofloxacin 500 mg PO BID x 5 Days  Return in about 4 weeks (around 03/02/2017) for Depression and Anxiety, Abdominal Pain.  The patient was given clear instructions to go to ER or return to medical center if symptoms don't improve, worsen or new problems develop. The patient verbalized understanding. The patient was told to call to get lab results if they haven't heard anything in the next week.     This note has been created with Surveyor, quantity. Any transcriptional errors are unintentional.    Broderick Fonseca, MD, MHA, FACP, [place], [place]  [place], [place]   02/02/2017, 3:07 PM

## 2017-02-02 NOTE — Patient Instructions (Signed)
DASH Eating Plan DASH stands for "Dietary Approaches to Stop Hypertension." The DASH eating plan is a healthy eating plan that has been shown to reduce high blood pressure (hypertension). It may also reduce your risk for type 2 diabetes, heart disease, and stroke. The DASH eating plan may also help with weight loss. What are tips for following this plan? General guidelines  Avoid eating more than 2,300 mg (milligrams) of salt (sodium) a day. If you have hypertension, you may need to reduce your sodium intake to 1,500 mg a day.  Limit alcohol intake to no more than 1 drink a day for nonpregnant women and 2 drinks a day for men. One drink equals 12 oz of beer, 5 oz of wine, or 1 oz of hard liquor.  Work with your health care provider to maintain a healthy body weight or to lose weight. Ask what an ideal weight is for you.  Get at least 30 minutes of exercise that causes your heart to beat faster (aerobic exercise) most days of the week. Activities may include walking, swimming, or biking.  Work with your health care provider or diet and nutrition specialist (dietitian) to adjust your eating plan to your individual calorie needs. Reading food labels  Check food labels for the amount of sodium per serving. Choose foods with less than 5 percent of the Daily Value of sodium. Generally, foods with less than 300 mg of sodium per serving fit into this eating plan.  To find whole grains, look for the word "whole" as the first word in the ingredient list. Shopping  Buy products labeled as "low-sodium" or "no salt added."  Buy fresh foods. Avoid canned foods and premade or frozen meals. Cooking  Avoid adding salt when cooking. Use salt-free seasonings or herbs instead of table salt or sea salt. Check with your health care provider or pharmacist before using salt substitutes.  Do not fry foods. Cook foods using healthy methods such as baking, boiling, grilling, and broiling instead.  Cook with  heart-healthy oils, such as olive, canola, soybean, or sunflower oil. Meal planning   Eat a balanced diet that includes: ? 5 or more servings of fruits and vegetables each day. At each meal, try to fill half of your plate with fruits and vegetables. ? Up to 6-8 servings of whole grains each day. ? Less than 6 oz of lean meat, poultry, or fish each day. A 3-oz serving of meat is about the same size as a deck of cards. One egg equals 1 oz. ? 2 servings of low-fat dairy each day. ? A serving of nuts, seeds, or beans 5 times each week. ? Heart-healthy fats. Healthy fats called Omega-3 fatty acids are found in foods such as flaxseeds and coldwater fish, like sardines, salmon, and mackerel.  Limit how much you eat of the following: ? Canned or prepackaged foods. ? Food that is high in trans fat, such as fried foods. ? Food that is high in saturated fat, such as fatty meat. ? Sweets, desserts, sugary drinks, and other foods with added sugar. ? Full-fat dairy products.  Do not salt foods before eating.  Try to eat at least 2 vegetarian meals each week.  Eat more home-cooked food and less restaurant, buffet, and fast food.  When eating at a restaurant, ask that your food be prepared with less salt or no salt, if possible. What foods are recommended? The items listed may not be a complete list. Talk with your dietitian about what   dietary choices are best for you. Grains Whole-grain or whole-wheat bread. Whole-grain or whole-wheat pasta. Brown rice. Oatmeal. Quinoa. Bulgur. Whole-grain and low-sodium cereals. Pita bread. Low-fat, low-sodium crackers. Whole-wheat flour tortillas. Vegetables Fresh or frozen vegetables (raw, steamed, roasted, or grilled). Low-sodium or reduced-sodium tomato and vegetable juice. Low-sodium or reduced-sodium tomato sauce and tomato paste. Low-sodium or reduced-sodium canned vegetables. Fruits All fresh, dried, or frozen fruit. Canned fruit in natural juice (without  added sugar). Meat and other protein foods Skinless chicken or turkey. Ground chicken or turkey. Pork with fat trimmed off. Fish and seafood. Egg whites. Dried beans, peas, or lentils. Unsalted nuts, nut butters, and seeds. Unsalted canned beans. Lean cuts of beef with fat trimmed off. Low-sodium, lean deli meat. Dairy Low-fat (1%) or fat-free (skim) milk. Fat-free, low-fat, or reduced-fat cheeses. Nonfat, low-sodium ricotta or cottage cheese. Low-fat or nonfat yogurt. Low-fat, low-sodium cheese. Fats and oils Soft margarine without trans fats. Vegetable oil. Low-fat, reduced-fat, or light mayonnaise and salad dressings (reduced-sodium). Canola, safflower, olive, soybean, and sunflower oils. Avocado. Seasoning and other foods Herbs. Spices. Seasoning mixes without salt. Unsalted popcorn and pretzels. Fat-free sweets. What foods are not recommended? The items listed may not be a complete list. Talk with your dietitian about what dietary choices are best for you. Grains Baked goods made with fat, such as croissants, muffins, or some breads. Dry pasta or rice meal packs. Vegetables Creamed or fried vegetables. Vegetables in a cheese sauce. Regular canned vegetables (not low-sodium or reduced-sodium). Regular canned tomato sauce and paste (not low-sodium or reduced-sodium). Regular tomato and vegetable juice (not low-sodium or reduced-sodium). Pickles. Olives. Fruits Canned fruit in a light or heavy syrup. Fried fruit. Fruit in cream or butter sauce. Meat and other protein foods Fatty cuts of meat. Ribs. Fried meat. Bacon. Sausage. Bologna and other processed lunch meats. Salami. Fatback. Hotdogs. Bratwurst. Salted nuts and seeds. Canned beans with added salt. Canned or smoked fish. Whole eggs or egg yolks. Chicken or turkey with skin. Dairy Whole or 2% milk, cream, and half-and-half. Whole or full-fat cream cheese. Whole-fat or sweetened yogurt. Full-fat cheese. Nondairy creamers. Whipped toppings.  Processed cheese and cheese spreads. Fats and oils Butter. Stick margarine. Lard. Shortening. Ghee. Bacon fat. Tropical oils, such as coconut, palm kernel, or palm oil. Seasoning and other foods Salted popcorn and pretzels. Onion salt, garlic salt, seasoned salt, table salt, and sea salt. Worcestershire sauce. Tartar sauce. Barbecue sauce. Teriyaki sauce. Soy sauce, including reduced-sodium. Steak sauce. Canned and packaged gravies. Fish sauce. Oyster sauce. Cocktail sauce. Horseradish that you find on the shelf. Ketchup. Mustard. Meat flavorings and tenderizers. Bouillon cubes. Hot sauce and Tabasco sauce. Premade or packaged marinades. Premade or packaged taco seasonings. Relishes. Regular salad dressings. Where to find more information:  National Heart, Lung, and Blood Institute: www.nhlbi.nih.gov  American Heart Association: www.heart.org Summary  The DASH eating plan is a healthy eating plan that has been shown to reduce high blood pressure (hypertension). It may also reduce your risk for type 2 diabetes, heart disease, and stroke.  With the DASH eating plan, you should limit salt (sodium) intake to 2,300 mg a day. If you have hypertension, you may need to reduce your sodium intake to 1,500 mg a day.  When on the DASH eating plan, aim to eat more fresh fruits and vegetables, whole grains, lean proteins, low-fat dairy, and heart-healthy fats.  Work with your health care provider or diet and nutrition specialist (dietitian) to adjust your eating plan to your individual   calorie needs. This information is not intended to replace advice given to you by your health care provider. Make sure you discuss any questions you have with your health care provider. Document Released: 06/12/2011 Document Revised: 06/16/2016 Document Reviewed: 06/16/2016 Elsevier Interactive Patient Education  2017 [employer] Hypertension Hypertension, commonly called high blood pressure, is when the force of blood  pumping through the arteries is too strong. The arteries are the blood vessels that carry blood from the heart throughout the body. Hypertension forces the heart to work harder to pump blood and may cause arteries to become narrow or stiff. Having untreated or uncontrolled hypertension can cause heart attacks, strokes, kidney disease, and other problems. A blood pressure reading consists of a higher number over a lower number. Ideally, your blood pressure should be below 120/80. The first ("top") number is called the systolic pressure. It is a measure of the pressure in your arteries as your heart beats. The second ("bottom") number is called the diastolic pressure. It is a measure of the pressure in your arteries as the heart relaxes. What are the causes? The cause of this condition is not known. What increases the risk? Some risk factors for high blood pressure are under your control. Others are not. Factors you can change  Smoking.  Having type 2 diabetes mellitus, high cholesterol, or both.  Not getting enough exercise or physical activity.  Being overweight.  Having too much fat, sugar, calories, or salt (sodium) in your diet.  Drinking too much alcohol. Factors that are difficult or impossible to change  Having chronic kidney disease.  Having a family history of high blood pressure.  Age. Risk increases with age.  Race. You may be at higher risk if you are African-American.  Gender. Men are at higher risk than women before age 45. After age 65, women are at higher risk than men.  Having obstructive sleep apnea.  Stress. What are the signs or symptoms? Extremely high blood pressure (hypertensive crisis) may cause:  Headache.  Anxiety.  Shortness of breath.  Nosebleed.  Nausea and vomiting.  Severe chest pain.  Jerky movements you cannot control (seizures).  How is this diagnosed? This condition is diagnosed by measuring your blood pressure while you are  seated, with your arm resting on a surface. The cuff of the blood pressure monitor will be placed directly against the skin of your upper arm at the level of your heart. It should be measured at least twice using the same arm. Certain conditions can cause a difference in blood pressure between your right and left arms. Certain factors can cause blood pressure readings to be lower or higher than normal (elevated) for a short period of time:  When your blood pressure is higher when you are in a health care provider's office than when you are at home, this is called white coat hypertension. Most people with this condition do not need medicines.  When your blood pressure is higher at home than when you are in a health care provider's office, this is called masked hypertension. Most people with this condition may need medicines to control blood pressure.  If you have a high blood pressure reading during one visit or you have normal blood pressure with other risk factors:  You may be asked to return on a different day to have your blood pressure checked again.  You may be asked to monitor your blood pressure at home for 1 week or longer.  If you are diagnosed   with hypertension, you may have other blood or imaging tests to help your health care provider understand your overall risk for other conditions. How is this treated? This condition is treated by making healthy lifestyle changes, such as eating healthy foods, exercising more, and reducing your alcohol intake. Your health care provider may prescribe medicine if lifestyle changes are not enough to get your blood pressure under control, and if:  Your systolic blood pressure is above 130.  Your diastolic blood pressure is above 80.  Your personal target blood pressure may vary depending on your medical conditions, your age, and other factors. Follow these instructions at home: Eating and drinking  Eat a diet that is high in fiber and potassium,  and low in sodium, added sugar, and fat. An example eating plan is called the DASH (Dietary Approaches to Stop Hypertension) diet. To eat this way: ? Eat plenty of fresh fruits and vegetables. Try to fill half of your plate at each meal with fruits and vegetables. ? Eat whole grains, such as whole wheat pasta, brown rice, or whole grain bread. Fill about one quarter of your plate with whole grains. ? Eat or drink low-fat dairy products, such as skim milk or low-fat yogurt. ? Avoid fatty cuts of meat, processed or cured meats, and poultry with skin. Fill about one quarter of your plate with lean proteins, such as fish, chicken without skin, beans, eggs, and tofu. ? Avoid premade and processed foods. These tend to be higher in sodium, added sugar, and fat.  Reduce your daily sodium intake. Most people with hypertension should eat less than 1,500 mg of sodium a day.  Limit alcohol intake to no more than 1 drink a day for nonpregnant women and 2 drinks a day for men. One drink equals 12 oz of beer, 5 oz of wine, or 1 oz of hard liquor. Lifestyle  Work with your health care provider to maintain a healthy body weight or to lose weight. Ask what an ideal weight is for you.  Get at least 30 minutes of exercise that causes your heart to beat faster (aerobic exercise) most days of the week. Activities may include walking, swimming, or biking.  Include exercise to strengthen your muscles (resistance exercise), such as pilates or lifting weights, as part of your weekly exercise routine. Try to do these types of exercises for 30 minutes at least 3 days a week.  Do not use any products that contain nicotine or tobacco, such as cigarettes and e-cigarettes. If you need help quitting, ask your health care provider.  Monitor your blood pressure at home as told by your health care provider.  Keep all follow-up visits as told by your health care provider. This is important. Medicines  Take over-the-counter and  prescription medicines only as told by your health care provider. Follow directions carefully. Blood pressure medicines must be taken as prescribed.  Do not skip doses of blood pressure medicine. Doing this puts you at risk for problems and can make the medicine less effective.  Ask your health care provider about side effects or reactions to medicines that you should watch for. Contact a health care provider if:  You think you are having a reaction to a medicine you are taking.  You have headaches that keep coming back (recurring).  You feel dizzy.  You have swelling in your ankles.  You have trouble with your vision. Get help right away if:  You develop a severe headache or confusion.  You   have unusual weakness or numbness.  You feel faint.  You have severe pain in your chest or abdomen.  You vomit repeatedly.  You have trouble breathing. Summary  Hypertension is when the force of blood pumping through your arteries is too strong. If this condition is not controlled, it may put you at risk for serious complications.  Your personal target blood pressure may vary depending on your medical conditions, your age, and other factors. For most people, a normal blood pressure is less than 120/80.  Hypertension is treated with lifestyle changes, medicines, or a combination of both. Lifestyle changes include weight loss, eating a healthy, low-sodium diet, exercising more, and limiting alcohol. This information is not intended to replace advice given to you by your health care provider. Make sure you discuss any questions you have with your health care provider. Document Released: 06/23/2005 Document Revised: 05/21/2016 Document Reviewed: 05/21/2016 Elsevier Interactive Patient Education  2018 [employer]. Bipolar 1 Disorder Bipolar 1 disorder is a mental health disorder in which a person has episodes of emotional highs (mania), and may also have episodes of emotional lows  (depression) in addition to highs. Bipolar 1 disorder is different from other bipolar disorders because it involves extreme manic episodes. These episodes last at least one week or involve symptoms that are so severe that hospitalization is needed to keep the person safe. What increases the risk? The cause of this condition is not known. However, certain factors make you more likely to have bipolar disorder, such as:  Having a family member with the disorder.  An imbalance of certain chemicals in the brain (neurotransmitters).  Stress, such as illness, financial problems, or a death.  Certain conditions that affect the brain or spinal cord (neurologic conditions).  Brain injury (trauma).  Having another mental health disorder, such as: ? Obsessive compulsive disorder. ? Schizophrenia.  What are the signs or symptoms? Symptoms of mania include:  Very high self-esteem or self-confidence.  Decreased need for sleep.  Unusual talkativeness or feeling a need to keep talking. Speech may be very fast. It may seem like you cannot stop talking.  Racing thoughts or constant talking, with quick shifts between topics that may or may not be related (flight of ideas).  Decreased ability to focus or concentrate.  Increased purposeful activity, such as work, studies, or social activity.  Increased nonproductive activity. This could be pacing, squirming and fidgeting, or finger and toe tapping.  Impulsive behavior and poor judgment. This may result in high-risk activities, such as having unprotected sex or spending a lot of money.  Symptoms of depression include:  Feeling sad, hopeless, or helpless.  Frequent or uncontrollable crying.  Lack of feeling or caring about anything.  Sleeping too much.  Moving more slowly than usual.  Not being able to enjoy things you used to enjoy.  Wanting to be alone all the time.  Feeling guilty or worthless.  Lack of energy or  motivation.  Trouble concentrating or remembering.  Trouble making decisions.  Increased appetite.  Thoughts of death, or the desire to harm yourself.  Sometimes, you may have a mixed mood. This means having symptoms of depression and mania. Stress can make symptoms worse. How is this diagnosed? To diagnose bipolar disorder, your health care provider may ask about your:  Emotional episodes.  Medical history.  Alcohol and drug use. This includes prescription medicines. Certain medical conditions and substances can cause symptoms that seem like bipolar disorder (secondary bipolar disorder).  How is this  treated? Bipolar disorder is a long-term (chronic) illness. It is best controlled with ongoing (continuous) treatment rather than treatment only when symptoms occur. Treatment may include:  Medicine. Medicine can be prescribed by a provider who specializes in treating mental disorders (psychiatrist). ? Medicines called mood stabilizers are usually prescribed. ? If symptoms occur even while taking a mood stabilizer, other medicines may be added.  Psychotherapy. Some forms of talk therapy, such as cognitive-behavioral therapy (CBT), can provide support, education, and guidance.  Coping methods, such as journaling or relaxation exercises. These may include: ? Yoga. ? Meditation. ? Deep breathing.  Lifestyle changes, such as: ? Limiting alcohol and drug use. ? Exercising regularly. ? Getting plenty of sleep. ? Making healthy eating choices.  A combination of medicine, talk therapy, and coping methods is best. A procedure in which electricity is applied to the brain through the scalp (electroconvulsive therapy) may be used in cases of severe mania when medicine and psychotherapy work too slowly or do not work. Follow these instructions at home: Activity   Return to your normal activities as told by your health care provider.  Find activities that you enjoy, and make time to do  them.  Exercise regularly as told by your health care provider. Lifestyle  Limit alcohol intake to no more than 1 drink a day for nonpregnant women and 2 drinks a day for men. One drink equals 12 oz of beer, 5 oz of wine, or 1 oz of hard liquor.  Follow a set schedule for eating and sleeping.  Eat a balanced diet that includes fresh fruits and vegetables, whole grains, low-fat dairy, and lean meat.  Get 7-8 hours of sleep each night. General instructions  Take over-the-counter and prescription medicines only as told by your health care provider.  Think about joining a support group. Your health care provider may be able to recommend a support group.  Talk with your family and loved ones about your treatment goals and how they can help.  Keep all follow-up visits as told by your health care provider. This is important. Where to find more information: For more information about bipolar disorder, visit the following websites:  [employer] on Mental Illness: www.nami.[place]: https://carter.com/  Contact a health care provider if:  Your symptoms get worse.  You have side effects from your medicine, and they get worse.  You have trouble sleeping.  You have trouble doing daily activities.  You feel unsafe in your surroundings.  You are dealing with substance abuse. Get help right away if:  You have new symptoms.  You have thoughts about harming yourself.  You self-harm. This information is not intended to replace advice given to you by your health care provider. Make sure you discuss any questions you have with your health care provider. Document Released: 09/29/2000 Document Revised: 02/17/2016 Document Reviewed: 02/21/2016 Elsevier Interactive Patient Education  2018 [place]. Generalized Anxiety Disorder, Adult Generalized anxiety disorder (GAD) is a mental health disorder. People with this condition constantly worry  about everyday events. Unlike normal anxiety, worry related to GAD is not triggered by a specific event. These worries also do not fade or get better with time. GAD interferes with life functions, including relationships, work, and school. GAD can vary from mild to severe. People with severe GAD can have intense waves of anxiety with physical symptoms (panic attacks). What are the causes? The exact cause of GAD is not known. What increases the risk? This  condition is more likely to develop in:  Women.  People who have a family history of anxiety disorders.  People who are very shy.  People who experience very stressful life events, such as the death of a loved one.  People who have a very stressful family environment.  What are the signs or symptoms? People with GAD often worry excessively about many things in their lives, such as their health and family. They may also be overly concerned about:  Doing well at work.  Being on time.  Natural disasters.  Friendships.  Physical symptoms of GAD include:  Fatigue.  Muscle tension or having muscle twitches.  Trembling or feeling shaky.  Being easily startled.  Feeling like your heart is pounding or racing.  Feeling out of breath or like you cannot take a deep breath.  Having trouble falling asleep or staying asleep.  Sweating.  Nausea, diarrhea, or irritable bowel syndrome (IBS).  Headaches.  Trouble concentrating or remembering facts.  Restlessness.  Irritability.  How is this diagnosed? Your health care provider can diagnose GAD based on your symptoms and medical history. You will also have a physical exam. The health care provider will ask specific questions about your symptoms, including how severe they are, when they started, and if they come and go. Your health care provider may ask you about your use of alcohol or drugs, including prescription medicines. Your health care provider may refer you to a mental  health specialist for further evaluation. Your health care provider will do a thorough examination and may perform additional tests to rule out other possible causes of your symptoms. To be diagnosed with GAD, a person must have anxiety that:  Is out of his or her control.  Affects several different aspects of his or her life, such as work and relationships.  Causes distress that makes him or her unable to take part in normal activities.  Includes at least three physical symptoms of GAD, such as restlessness, fatigue, trouble concentrating, irritability, muscle tension, or sleep problems.  Before your health care provider can confirm a diagnosis of GAD, these symptoms must be present more days than they are not, and they must last for six months or longer. How is this treated? The following therapies are usually used to treat GAD:  Medicine. Antidepressant medicine is usually prescribed for long-term daily control. Antianxiety medicines may be added in severe cases, especially when panic attacks occur.  Talk therapy (psychotherapy). Certain types of talk therapy can be helpful in treating GAD by providing support, education, and guidance. Options include: ? Cognitive behavioral therapy (CBT). People learn coping skills and techniques to ease their anxiety. They learn to identify unrealistic or negative thoughts and behaviors and to replace them with positive ones. ? Acceptance and commitment therapy (ACT). This treatment teaches people how to be mindful as a way to cope with unwanted thoughts and feelings. ? Biofeedback. This process trains you to manage your body's response (physiological response) through breathing techniques and relaxation methods. You will work with a therapist while machines are used to monitor your physical symptoms.  Stress management techniques. These include yoga, meditation, and exercise.  A mental health specialist can help determine which treatment is best for  you. Some people see improvement with one type of therapy. However, other people require a combination of therapies. Follow these instructions at home:  Take over-the-counter and prescription medicines only as told by your health care provider.  Try to maintain a normal routine.  Try to anticipate stressful situations and allow extra time to manage them.  Practice any stress management or self-calming techniques as taught by your health care provider.  Do not punish yourself for setbacks or for not making progress.  Try to recognize your accomplishments, even if they are small.  Keep all follow-up visits as told by your health care provider. This is important. Contact a health care provider if:  Your symptoms do not get better.  Your symptoms get worse.  You have signs of depression, such as: ? A persistently sad, cranky, or irritable mood. ? Loss of enjoyment in activities that used to bring you joy. ? Change in weight or eating. ? Changes in sleeping habits. ? Avoiding friends or family members. ? Loss of energy for normal tasks. ? Feelings of guilt or worthlessness. Get help right away if:  You have serious thoughts about hurting yourself or others. If you ever feel like you may hurt yourself or others, or have thoughts about taking your own life, get help right away. You can go to your nearest emergency department or call:  Your local emergency services (911 in the [place]  A suicide crisis helpline, such as the [facility] at 754-273-0600. This is open 24 hours a day.  Summary  Generalized anxiety disorder (GAD) is a mental health disorder that involves worry that is not triggered by a specific event.  People with GAD often worry excessively about many things in their lives, such as their health and family.  GAD may cause physical symptoms such as restlessness, trouble concentrating, sleep problems, frequent sweating, nausea, diarrhea,  headaches, and trembling or muscle twitching.  A mental health specialist can help determine which treatment is best for you. Some people see improvement with one type of therapy. However, other people require a combination of therapies. This information is not intended to replace advice given to you by your health care provider. Make sure you discuss any questions you have with your health care provider. Document Released: 10/18/2012 Document Revised: 05/13/2016 Document Reviewed: 05/13/2016 Elsevier Interactive Patient Education  [employer].

## 2017-02-03 ENCOUNTER — Other Ambulatory Visit: Payer: Self-pay | Admitting: Internal Medicine

## 2017-02-03 LAB — TSH: TSH: 0.483 u[IU]/mL (ref 0.450–4.500)

## 2017-02-03 LAB — URINALYSIS, COMPLETE
Bilirubin, UA: NEGATIVE
Glucose, UA: NEGATIVE
Leukocytes, UA: NEGATIVE
NITRITE UA: NEGATIVE
PH UA: 6 (ref 5.0–7.5)
Protein, UA: NEGATIVE
RBC, UA: NEGATIVE
Specific Gravity, UA: 1.017 (ref 1.005–1.030)
Urobilinogen, Ur: 0.2 mg/dL (ref 0.2–1.0)

## 2017-02-03 LAB — VITAMIN D 25 HYDROXY (VIT D DEFICIENCY, FRACTURES): VIT D 25 HYDROXY: 13.8 ng/mL — AB (ref 30.0–100.0)

## 2017-02-03 LAB — MICROSCOPIC EXAMINATION: Casts: NONE SEEN /lpf

## 2017-02-03 LAB — T4, FREE: FREE T4: 1.02 ng/dL (ref 0.82–1.77)

## 2017-02-03 MED ORDER — VITAMIN D (ERGOCALCIFEROL) 1.25 MG (50000 UNIT) PO CAPS: 50000.0000 [IU] | ORAL_CAPSULE | ORAL | 3 refills | Status: DC

## 2017-02-03 MED FILL — VIT D2 1.25 MG (50,000 UNIT: 1.25 MG | 84 days supply | Qty: 12 | Fill #0

## 2017-02-04 LAB — URINE CULTURE

## 2017-02-16 MED FILL — CYCLOBENZAPRINE 5 MG TABLET: 5 | 10 days supply | Qty: 30 | Fill #1

## 2017-02-18 ENCOUNTER — Other Ambulatory Visit (INDEPENDENT_AMBULATORY_CARE_PROVIDER_SITE_OTHER): Payer: Self-pay | Admitting: Internal Medicine

## 2017-02-18 DIAGNOSIS — N342 Other urethritis: Secondary | ICD-10-CM

## 2017-03-02 ENCOUNTER — Ambulatory Visit (INDEPENDENT_AMBULATORY_CARE_PROVIDER_SITE_OTHER): Payer: Self-pay | Admitting: Physician Assistant

## 2017-03-02 ENCOUNTER — Encounter (INDEPENDENT_AMBULATORY_CARE_PROVIDER_SITE_OTHER): Payer: Self-pay | Admitting: Physician Assistant

## 2017-03-02 VITALS — BP 136/96 | HR 87 | Temp 98.1°F | Wt 164.0 lb

## 2017-03-02 DIAGNOSIS — E559 Vitamin D deficiency, unspecified: Secondary | ICD-10-CM

## 2017-03-02 DIAGNOSIS — F319 Bipolar disorder, unspecified: Secondary | ICD-10-CM

## 2017-03-02 DIAGNOSIS — F3130 Bipolar disorder, current episode depressed, mild or moderate severity, unspecified: Secondary | ICD-10-CM

## 2017-03-02 DIAGNOSIS — F411 Generalized anxiety disorder: Secondary | ICD-10-CM

## 2017-03-02 DIAGNOSIS — R1084 Generalized abdominal pain: Secondary | ICD-10-CM

## 2017-03-02 MED ORDER — RANITIDINE HCL 150 MG PO TABS: 150.0000 mg | ORAL_TABLET | Freq: Two times a day (BID) | ORAL | 0 refills | Status: DC

## 2017-03-02 MED ORDER — VITAMIN D (ERGOCALCIFEROL) 1.25 MG (50000 UNIT) PO CAPS: 50000.0000 [IU] | ORAL_CAPSULE | ORAL | 1 refills | Status: DC

## 2017-03-02 MED ORDER — RISPERIDONE 0.5 MG PO TABS: 0.5000 mg | ORAL_TABLET | Freq: Every day | ORAL | 2 refills | Status: DC

## 2017-03-02 MED ORDER — HYDROXYZINE HCL 25 MG PO TABS: 25.0000 mg | ORAL_TABLET | Freq: Three times a day (TID) | ORAL | 3 refills | Status: DC | PRN

## 2017-03-02 MED FILL — raNITIdine HCL 150 MG TABS: 150 | 30 days supply | Qty: 60 | Fill #0

## 2017-03-02 MED FILL — risperiDONE 0.5 MG TABS: 0.5 | 30 days supply | Qty: 30 | Fill #0

## 2017-03-02 MED FILL — hydrOXYzine HCL 25 MG TABS: 25 | 30 days supply | Qty: 90 | Fill #0

## 2017-03-02 NOTE — Progress Notes (Signed)
Subjective:  Patient ID: Theresa Mccoy, female    DOB: [birth year]  Age: 36 y.o. MRN: [id number]  CC: f/u depression  HPI Viha Kriegel is a 36 y.o. female with a medical history of HTN, anemia, hyperthyroidism, bipolar disorder, and anxiety presents to f/u on anxiety and bipolar disorder. Says hydroxyzine and mirtazapine have been helpful with reducing anxiety. However, treatment has not been successful with depressive symptoms. Had a psychiatrist prescribe medicine for bipolar disorder in 2015 but she never filled medication. Has not returned for psychiatric care because she "did not like" her mental health providers. Pt reports she is mainly depressed and has never been manic.     Complains of occasional epigastric pain that becomes generalized to all the quadrants. Took Prilosec with no relief of symptoms. Previous labs noncontributory to diagnosis. Does not endorse any other symptoms.       Outpatient Medications Prior to Visit  Medication Sig Dispense Refill  . cyclobenzaprine (FLEXERIL) 5 MG tablet Take 1 tablet (5 mg total) by mouth 3 (three) times daily as needed for muscle spasms. 30 tablet 2  . hydrOXYzine (ATARAX/VISTARIL) 25 MG tablet Take 1 tablet (25 mg total) by mouth 3 (three) times daily as needed. 90 tablet 3  . loratadine (CLARITIN) 10 MG tablet Take 10 mg by mouth daily.    . meloxicam (MOBIC) 7.5 MG tablet Take 7.5 mg by mouth 2 (two) times daily.    . mirtazapine (REMERON) 30 MG tablet Take 1 tablet (30 mg total) by mouth at bedtime. 30 tablet 3  . atenolol (TENORMIN) 50 MG tablet Take 50 mg by mouth daily.    . Vitamin D, Ergocalciferol, (DRISDOL) 50000 units CAPS capsule Take 1 capsule (50,000 Units total) by mouth every 7 (seven) days. (Patient not taking: Reported on 03/02/2017) 12 capsule 3  . ciprofloxacin (CIPRO) 500 MG tablet Take 1 tablet (500 mg total) by mouth 2 (two) times daily. 14 tablet 0   No facility-administered medications prior to visit.       ROS Review of Systems  Constitutional: Negative for chills, fever and malaise/fatigue.  Eyes: Negative for blurred vision.  Respiratory: Negative for shortness of breath.   Cardiovascular: Negative for chest pain and palpitations.  Gastrointestinal: Negative for abdominal pain and nausea.  Genitourinary: Negative for dysuria and hematuria.  Musculoskeletal: Negative for joint pain and myalgias.  Skin: Negative for rash.  Neurological: Negative for tingling and headaches.  Psychiatric/Behavioral: Negative for depression. The patient is not nervous/anxious.     Objective:  BP (!) 136/96 (BP Location: Right Arm, Patient Position: Sitting, Cuff Size: Normal)   Pulse 87   Temp 98.1 F (36.7 C) (Oral)   Wt 164 lb (74.4 kg)   LMP 03/01/2017 (Exact Date)   SpO2 96%   BMI 31.37 kg/m   BP/Weight 03/02/2017 10/13/8117 07/11/7827  Systolic BP 562 130 865  Diastolic BP 96 86 81  Wt. (Lbs) 164 155.2 -  BMI 31.37 29.68 -      Physical Exam  Constitutional: She is oriented to person, place, and time.  Well developed, well nourished, NAD, polite  HENT:  Head: Normocephalic and atraumatic.  Eyes: No scleral icterus.  Neck: Normal range of motion. Neck supple. No thyromegaly present.  Cardiovascular: Normal rate, regular rhythm and normal heart sounds.   Pulmonary/Chest: Effort normal and breath sounds normal.  Abdominal: Soft. Bowel sounds are normal. There is no tenderness (mild TTP in all quadrants, moreso in the RUQ ).  Musculoskeletal: She exhibits  no edema.  Neurological: She is alert and oriented to person, place, and time. No cranial nerve deficit. Coordination normal.  Skin: Skin is warm and dry. No rash noted. No erythema. No pallor.  Psychiatric: She has a normal mood and affect. Her behavior is normal. Thought content normal.  Vitals reviewed.    Assessment & Plan:   1. Bipolar depression ([place]) - Begin risperiDONE (RISPERDAL) 0.5 MG tablet; Take 1 tablet (0.5 mg  total) by mouth at bedtime.  Dispense: 30 tablet; Refill: 2  2. Generalized anxiety disorder - Refill hydrOXYzine (ATARAX/VISTARIL) 25 MG tablet; Take 1 tablet (25 mg total) by mouth 3 (three) times daily as needed.  Dispense: 90 tablet; Refill: 3 - Stop Mirtazapine  3. Generalized abdominal pain - H. pylori antibody, IgG - Comprehensive metabolic panel - Begin ranitidine (ZANTAC) 150 MG tablet; Take 1 tablet (150 mg total) by mouth 2 (two) times daily.  Dispense: 60 tablet; Refill: 0  4. Hypercalcemia - PTH, intact and calcium   Meds ordered this encounter  Medications  . hydrOXYzine (ATARAX/VISTARIL) 25 MG tablet    Sig: Take 1 tablet (25 mg total) by mouth 3 (three) times daily as needed.    Dispense:  90 tablet    Refill:  3    Order Specific Question:   Supervising Provider    Answer:   Tresa Garter [id number]  . risperiDONE (RISPERDAL) 0.5 MG tablet    Sig: Take 1 tablet (0.5 mg total) by mouth at bedtime.    Dispense:  30 tablet    Refill:  2    Order Specific Question:   Supervising Provider    Answer:   Tresa Garter [id number]  . ranitidine (ZANTAC) 150 MG tablet    Sig: Take 1 tablet (150 mg total) by mouth 2 (two) times daily.    Dispense:  60 tablet    Refill:  0    Order Specific Question:   Supervising Provider    Answer:   Tresa Garter [id number]    Follow-up: Return in about 4 weeks (around 03/30/2017) for bipolar and anxiety.   Clent Demark PA

## 2017-03-02 NOTE — Patient Instructions (Signed)

## 2017-03-03 ENCOUNTER — Telehealth (HOSPITAL_COMMUNITY): Payer: Self-pay

## 2017-03-03 LAB — COMPREHENSIVE METABOLIC PANEL
A/G RATIO: 1.5 (ref 1.2–2.2)
ALT: 9 IU/L (ref 0–32)
AST: 9 IU/L (ref 0–40)
Albumin: 4 g/dL (ref 3.5–5.5)
Alkaline Phosphatase: 92 IU/L (ref 39–117)
BILIRUBIN TOTAL: 0.2 mg/dL (ref 0.0–1.2)
BUN / CREAT RATIO: 11 (ref 9–23)
BUN: 9 mg/dL (ref 6–20)
CHLORIDE: 111 mmol/L — AB (ref 96–106)
CO2: 22 mmol/L (ref 20–29)
Calcium: 11.4 mg/dL — ABNORMAL HIGH (ref 8.7–10.2)
Creatinine, Ser: 0.84 mg/dL (ref 0.57–1.00)
GFR, EST AFRICAN AMERICAN: 103 mL/min/{1.73_m2} (ref >59)
GFR, EST NON AFRICAN AMERICAN: 90 mL/min/{1.73_m2} (ref >59)
GLUCOSE: 90 mg/dL (ref 65–99)
Globulin, Total: 2.7 g/dL (ref 1.5–4.5)
POTASSIUM: 4.1 mmol/L (ref 3.5–5.2)
Sodium: 143 mmol/L (ref 134–144)
Total Protein: 6.7 g/dL (ref 6.0–8.5)

## 2017-03-03 LAB — H. PYLORI ANTIBODY, IGG

## 2017-03-03 LAB — PTH, INTACT AND CALCIUM

## 2017-03-04 ENCOUNTER — Encounter (INDEPENDENT_AMBULATORY_CARE_PROVIDER_SITE_OTHER): Payer: Self-pay

## 2017-03-06 ENCOUNTER — Other Ambulatory Visit (INDEPENDENT_AMBULATORY_CARE_PROVIDER_SITE_OTHER): Payer: Self-pay | Admitting: Internal Medicine

## 2017-03-06 DIAGNOSIS — R252 Cramp and spasm: Secondary | ICD-10-CM

## 2017-03-09 ENCOUNTER — Encounter (HOSPITAL_COMMUNITY): Payer: Self-pay | Admitting: Emergency Medicine

## 2017-03-09 ENCOUNTER — Ambulatory Visit (HOSPITAL_COMMUNITY)
Admission: EM | Admit: 2017-03-09 | Discharge: 2017-03-09 | Disposition: A | Discharge status: Home / Self Care | Payer: PRIVATE HEALTH INSURANCE | Attending: Internal Medicine | Admitting: Internal Medicine

## 2017-03-09 DIAGNOSIS — R1084 Generalized abdominal pain: Secondary | ICD-10-CM

## 2017-03-09 DIAGNOSIS — Z3202 Encounter for pregnancy test, result negative: Secondary | ICD-10-CM

## 2017-03-09 LAB — POCT URINALYSIS DIP (DEVICE)
Bilirubin Urine: NEGATIVE
Glucose, UA: NEGATIVE mg/dL
KETONES UR: NEGATIVE mg/dL
Leukocytes, UA: NEGATIVE
Nitrite: NEGATIVE
PH: 7 (ref 5.0–8.0)
PROTEIN: NEGATIVE mg/dL
UROBILINOGEN UA: 0.2 mg/dL (ref 0.0–1.0)

## 2017-03-09 LAB — POCT PREGNANCY, URINE: Preg Test, Ur: NEGATIVE

## 2017-03-09 NOTE — ED Provider Notes (Signed)
Theresa Mccoy    CSN: [id number] Arrival date & time: 03/09/17  1647     History   Chief Complaint Chief Complaint  Patient presents with  . Possible Pregnancy  . Palpitations    HPI Theresa Mccoy is a 36 y.o. female.   36 year old female with history of bipolar disorder, generalized anxiety disorder, hypertension, comes in for a possible pregnancy. Patient states she can "feel something moving" and thinks she is pregnant, these symptoms started 1 week ago. She has taken pregnancy tests at home with negative tests. LMP 03/01/2017. She states she self discontinued risperidone due to palpitations, and concerns for harming baby. She states palpitation has resolved since stopping risperidone. States she already experiences urinary frequency, and does not experience any changes to that. Denies dysuria, hematuria. Last bowel movement 4 days ago, without straining. She states she feels pain that is moving to different areas, including abdomen, breasts, lower back. Denies any aggravating and alleviating factors.      Past Medical History:  Diagnosis Date  . Anemia   . Hyperthyroidism   . Thyroid disease     Patient Active Problem List   Diagnosis Date Noted  . Muscle cramps 02/02/2017  . Bipolar disorder, current episode mixed, moderate ([place]) 02/02/2017  . Generalized abdominal pain 02/02/2017  . Generalized anxiety disorder 02/02/2017  . Essential hypertension 02/02/2017    Past Surgical History:  Procedure Laterality Date  . CESAREAN SECTION    . HAND SURGERY Left     OB History    No data available       Home Medications    Prior to Admission medications   Medication Sig Start Date End Date Taking? Authorizing Provider  cyclobenzaprine (FLEXERIL) 5 MG tablet Take 1 tablet (5 mg total) by mouth 3 (three) times daily as needed for muscle spasms. 02/02/17  Yes Tresa Garter, MD  hydrOXYzine (ATARAX/VISTARIL) 25 MG tablet Take 1 tablet (25 mg total) by  mouth 3 (three) times daily as needed. 03/02/17  Yes Clent Demark, PA-C  loratadine (CLARITIN) 10 MG tablet Take 10 mg by mouth daily.   Yes [provider]  meloxicam (MOBIC) 7.5 MG tablet Take 7.5 mg by mouth 2 (two) times daily.   Yes [provider]  mirtazapine (REMERON) 30 MG tablet Take 1 tablet (30 mg total) by mouth at bedtime. 02/02/17  Yes Tresa Garter, MD  ranitidine (ZANTAC) 150 MG tablet Take 1 tablet (150 mg total) by mouth 2 (two) times daily. 03/02/17  Yes Clent Demark, PA-C  Vitamin D, Ergocalciferol, (DRISDOL) 50000 units CAPS capsule Take 1 capsule (50,000 Units total) by mouth every 7 (seven) days. 03/02/17  Yes Clent Demark, PA-C  atenolol (TENORMIN) 50 MG tablet Take 50 mg by mouth daily.    [provider]  risperiDONE (RISPERDAL) 0.5 MG tablet Take 1 tablet (0.5 mg total) by mouth at bedtime. 03/02/17   Clent Demark, PA-C    Family History History reviewed. No pertinent family history.  Social History Social History  Substance Use Topics  . Smoking status: Current Every Day Smoker    Packs/day: 0.10    Types: Cigarettes  . Smokeless tobacco: Never Used  . 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     Physical Exam Triage Vital Signs ED  Triage Vitals  Enc Vitals Group     BP 03/09/17 1739 125/82     Pulse Rate 03/09/17 1739 (!) 107     Resp --      Temp 03/09/17 1739 98.8 F (37.1 C)     Temp Source 03/09/17 1739 Oral     SpO2 03/09/17 1739 99 %     Weight --      Height --      Head Circumference --      Peak Flow --      Pain Score 03/09/17 1741 0     Pain Loc --      Pain Edu? --      Excl. in [place]? --    No data found.   Updated Vital Signs BP 125/82 (BP Location: Left Arm)   Pulse (!) 107   Temp 98.8 F (37.1 C) (Oral)   LMP 03/01/2017   SpO2 99%   Visual  Acuity Right Eye Distance:   Left Eye Distance:   Bilateral Distance:    Right Eye Near:   Left Eye Near:    Bilateral Near:     Physical Exam  Constitutional: She is oriented to person, place, and time. She appears well-developed and well-nourished. No distress.  HENT:  Head: Normocephalic and atraumatic.  Eyes: Pupils are equal, round, and reactive to light. Conjunctivae are normal.  Cardiovascular: Normal rate, regular rhythm and normal heart sounds.  Exam reveals no gallop and no friction rub.   No murmur heard. Pulmonary/Chest: Effort normal and breath sounds normal. She has no wheezes. She has no rales.  Abdominal: Soft. Bowel sounds are normal. She exhibits no mass. There is tenderness (generalized.). There is no rebound, no guarding and no CVA tenderness.  Neurological: She is alert and oriented to person, place, and time.  Skin: Skin is warm and dry.  Psychiatric: She has a normal mood and affect. Her behavior is normal. Judgment normal.     UC Treatments / Results  Labs (all labs ordered are listed, but only abnormal results are displayed) Labs Reviewed  POCT URINALYSIS DIP (DEVICE) - Abnormal; Notable for the following:       Result Value   Hgb urine dipstick TRACE (*)    All other components within normal limits  POCT PREGNANCY, URINE    EKG  EKG Interpretation None       Radiology No results found.  Procedures Procedures (including critical care time)  Medications Ordered in UC Medications - No data to display   Initial Impression / Assessment and Plan / UC Course  I have reviewed the triage vital signs and the nursing notes.  Pertinent labs & imaging results that were available during my care of the patient were reviewed by me and considered in my medical decision making (see chart for details).    Discussed urine results with patient, negative pregnancy test. Discussed possible movement caused by constipation given last BM 4 days ago. Patient  stating "I know I'm pregnant", and that "I have had four kids even though they told me I couldn't get pregnant". Patient left prior to finishing discussion, she is in stable condition.   Final Clinical Impressions(s) / UC Diagnoses   Final diagnoses:  Generalized abdominal pain    New Prescriptions Discharge Medication List as of 03/09/2017  6:28 PM         Ok Edwards, PA-C 03/09/17 1834

## 2017-03-09 NOTE — ED Triage Notes (Signed)
Pt states she is having movement in her right and left abdomen, and right and left lower back.  Pt also reports palpitations with her medications.  She states since she was put on new medication she does not feel the heart racing anymore.  Pt took pregnancy test at home last week and it was negative.

## 2017-03-12 ENCOUNTER — Encounter (INDEPENDENT_AMBULATORY_CARE_PROVIDER_SITE_OTHER): Payer: Self-pay | Admitting: Physician Assistant

## 2017-03-12 ENCOUNTER — Ambulatory Visit (INDEPENDENT_AMBULATORY_CARE_PROVIDER_SITE_OTHER): Payer: Self-pay | Admitting: Physician Assistant

## 2017-03-12 VITALS — BP 127/83 | HR 84 | Temp 98.3°F | Resp 18 | Ht 64.0 in | Wt 165.0 lb

## 2017-03-12 DIAGNOSIS — Z3200 Encounter for pregnancy test, result unknown: Secondary | ICD-10-CM

## 2017-03-12 NOTE — Progress Notes (Signed)
Subjective:  Patient ID: Theresa Mccoy, female    DOB: Dec 12, 1980  Age: 36 y.o. MRN: [id number]  CC: possible pregnancy  HPI  Theresa Mccoy is a 36 y.o. female with a medical history of HTN, anemia, hyperthyroidism, bipolar disorder, and anxiety presents to f/u on anxiety and bipolar disorder. She was started on risperidone 0.5 mg qhs on 03/02/17, hydroxyzine 25 mg TID PRN. Took risperidone until she felt something moving in her uterus. Went to the ED to have testing done and was found to have a negative urine HCG. ED provider reported patient stating "I know I'm pregnant", and that "I have had four kids even though they told me I couldn't get pregnant".  Patient elaborates further that two pregnancies had negative urine HCG and positive serum HCG. Patient also endorses nausea, vomiting x1 episode, and breast tenderness. Says family has felt the baby kicking.    Has not heard from psychiatry referral yet. Upon investigation, patient had been called twice. However, patient states she has not receiving any messages. Number on file is reported as correct.    Outpatient Medications Prior to Visit  Medication Sig Dispense Refill  . atenolol (TENORMIN) 50 MG tablet Take 50 mg by mouth daily.    . cyclobenzaprine (FLEXERIL) 5 MG tablet Take 1 tablet (5 mg total) by mouth 3 (three) times daily as needed for muscle spasms. 30 tablet 2  . hydrOXYzine (ATARAX/VISTARIL) 25 MG tablet Take 1 tablet (25 mg total) by mouth 3 (three) times daily as needed. 90 tablet 3  . loratadine (CLARITIN) 10 MG tablet Take 10 mg by mouth daily.    . meloxicam (MOBIC) 7.5 MG tablet Take 7.5 mg by mouth 2 (two) times daily.    . mirtazapine (REMERON) 30 MG tablet Take 1 tablet (30 mg total) by mouth at bedtime. 30 tablet 3  . ranitidine (ZANTAC) 150 MG tablet Take 1 tablet (150 mg total) by mouth 2 (two) times daily. 60 tablet 0  . risperiDONE (RISPERDAL) 0.5 MG tablet Take 1 tablet (0.5 mg total) by mouth at bedtime. 30  tablet 2  . Vitamin D, Ergocalciferol, (DRISDOL) 50000 units CAPS capsule Take 1 capsule (50,000 Units total) by mouth every 7 (seven) days. 12 capsule 1   No facility-administered medications prior to visit.      ROS Review of Systems  Constitutional: Negative for chills, fever and malaise/fatigue.  Eyes: Negative for blurred vision.  Respiratory: Negative for shortness of breath.   Cardiovascular: Negative for chest pain and palpitations.  Gastrointestinal: Positive for nausea and vomiting. Negative for abdominal pain.  Genitourinary: Negative for dysuria and hematuria.  Musculoskeletal: Negative for joint pain and myalgias.  Skin: Negative for rash.  Neurological: Negative for tingling and headaches.  Psychiatric/Behavioral: Negative for depression. The patient is not nervous/anxious.     Objective:  BP 127/83 (BP Location: Left Arm, Patient Position: Sitting, Cuff Size: Normal)   Pulse 84   Temp 98.3 F (36.8 C) (Oral)   Resp 18   Ht 5\' 4"  (1.626 m)   Wt 165 lb (74.8 kg)   LMP 03/01/2017 Comment: 2017 full period  SpO2 99%   BMI 28.32 kg/m   BP/Weight 03/12/2017 03/09/2017 7/74/1287  Systolic BP 867 672 094  Diastolic BP 83 82 96  Wt. (Lbs) 165 - 164  BMI 28.32 - 31.37      Physical Exam  Constitutional: She is oriented to person, place, and time.  Well developed, centrally obese, NAD, polite  HENT:  Head: Normocephalic and atraumatic.  Eyes: No scleral icterus.  Neck: Normal range of motion. Neck supple. No thyromegaly present.  Cardiovascular: Normal rate, regular rhythm and normal heart sounds.   Pulmonary/Chest: Effort normal and breath sounds normal.  Abdominal: Soft. Bowel sounds are normal. There is no tenderness.  No "kicking" felt  Musculoskeletal: She exhibits no edema.  Neurological: She is alert and oriented to person, place, and time. No cranial nerve deficit. Coordination normal.  Skin: Skin is warm and dry. No rash noted. No erythema. No pallor.   Psychiatric: She has a normal mood and affect. Her behavior is normal. Thought content normal.  Vitals reviewed.    Assessment & Plan:   1. Possible pregnancy - Beta HCG, Quant - Stop Risperidone, Mirtazapine, and Hydroxyzine - Will refer to OB/GYN based on results.     Follow-up: Return in about 4 weeks (around 04/09/2017), or if symptoms worsen or fail to improve.   Clent Demark PA

## 2017-03-12 NOTE — Patient Instructions (Signed)
Human Chorionic Gonadotropin Test °Human chorionic gonadotropin (hCG) is a hormone produced during pregnancy by the cells that form the placenta. The placenta is the organ that grows inside your womb (uterus) to nourish a developing baby. When you are pregnant, hCG starts to appear in your blood about 11 days after conception. It continues to go up for the first 8-11 weeks of pregnancy. °Your hCG level can be measured with several different types of tests. You may have: °· A urine test. °? hCG is eliminated from your body by your kidneys, so a urine test is one way to check for this hormone. °? A urine test only shows whether there is hCG in your urine. It does not measure how much. °? You may have a urine test to find out whether you are pregnant. °? A home pregnancy test detects whether there is hCG in your urine. °· A qualitative blood test. °? Like the urine test, this blood test only shows whether there is hCG in your blood. It does not measure how much. °? You may have this type of blood test to find out whether you are pregnant. °· A quantitative blood test. °? This type of blood test measures the amount of hCG in your blood. °? You may have this type of test to diagnose an abnormal pregnancy or determine whether you are at risk of, or have had, a failed pregnancy (miscarriage). ° °How do I prepare for this test? °For the urine test: °· Limit your fluid intake before the urine test as directed by your health care provider. °· Collect the sample the first time you urinate in the morning. °· Let your health care provider know if you have blood in your urine. This may interfere with the test result. ° °Some medicines may interfere with the urine and blood tests. Let your health care provider know about all the medicines you are taking. No additional preparation is required for the blood test. °What do the results mean? °It is your responsibility to obtain your test results. Ask the lab or department performing  the test when and how you will get your results. Talk to your health care provider if you have any questions about your test results. °The results of the hCG urine test and the qualitative hCG blood test are either positive or negative. The results of the quantitative hCG blood test are reported as a number. hCG is measured in international units per liter (IU/L). °Meaning of Negative Test Results °A negative result on a urine or qualitative blood test could mean that you are not pregnant. It could also mean the test was done too early to detect hCG. If you still have other signs of pregnancy, the test should be repeated. °Meaning of Positive Test Results °A positive result on the urine or qualitative blood tests means you are most likely pregnant. Your health care provider may confirm your pregnancy with an imaging study of the inside of your uterus at 5-6 weeks (ultrasound). °Range of Normal Values °Ranges for normal values for the quantitative hCG blood test may vary among different labs and hospitals. You should always check with your health care provider after having lab work or other tests done to discuss whether your values are considered within normal limits. °· Less than 5 IU/L means it is most likely you are not pregnant. °· Greater than 25 IU/L means it is most likely you are pregnant. ° °Meaning of Results Outside Normal Value Ranges °If your hCG   level on the quantitative test is not what would be expected, you may have the test again. It may also be important for your health care provider to know whether your hCG level goes up or down over time. Common causes of results outside the normal range include: °· Being pregnant with twins (hCG level is higher than expected). °· Having an ectopic pregnancy (hCG rises more slowly than expected). °· Miscarriage (hCG level falls). °· Abnormal growths in the womb (hCG level is higher than expected). ° °Talk with your health care provider to discuss your results,  treatment options, and if necessary, the need for more tests. Talk with your health care provider if you have any questions about your results. °This information is not intended to replace advice given to you by your health care provider. Make sure you discuss any questions you have with your health care provider. °Document Released: 07/25/2004 Document Revised: 02/27/2016 Document Reviewed: 09/27/2013 °Elsevier Interactive Patient Education © 2018 [employer] ° °

## 2017-03-12 NOTE — Progress Notes (Signed)
/198  Subjective:  Patient ID: Theresa Mccoy, female    DOB: 1981/02/20  Age: 36 y.o. MRN: [id number]  CC: No chief complaint on file.   HPI A ED Discharged    03/09/2017 (53 minutes) [facility]    Treatment team  Providers   Generalized abdominal pain  Clinical impression   Possible Pregnancy, Palpitations  Chief complaint   ED Provider Notes  Arturo Morton (Physician Assistant Certified) . [facility]  Cosign Needed  Expand All Collapse All    [facility]    CSN: [id number] Arrival date & time: 03/09/17  1647     History              Chief Complaint    Chief Complaint  Patient presents with  . Possible Pregnancy  . Palpitations    HPI Theresa Mccoy is a 36 y.o. female.  36 year old female with history of bipolar disorder, generalized anxiety disorder, hypertension, comes in for a possible pregnancy. Patient states she can "feel something moving" and thinks she is pregnant, these symptoms started 1 week ago. She has taken pregnancy tests at home with negative tests. LMP 03/01/2017. She states she self discontinued risperidone due to palpitations, and concerns for harming baby. She states palpitation has resolved since stopping risperidone. States she already experiences urinary frequency, and does not experience any changes to that. Denies dysuria, hematuria. Last bowel movement 4 days ago, without straining. She states she feels pain that is moving to different areas, including abdomen, breasts, lower back. Denies any aggravating and alleviating factors.        Past Medical History:  Diagnosis Date  . Anemia   . Hyperthyroidism   . Thyroid disease         Patient Active Problem List   Diagnosis Date Noted  . Muscle cramps 02/02/2017  . Bipolar disorder, current episode mixed, moderate ([place]) 02/02/2017  . Generalized abdominal pain 02/02/2017  . Generalized anxiety disorder  02/02/2017  . Essential hypertension 02/02/2017         Past Surgical History:  Procedure Laterality Date  . CESAREAN SECTION    . HAND SURGERY Left        OB History    No data available       Home Medications            Prior to Admission medications   Medication Sig Start Date End Date Taking? Authorizing Provider  cyclobenzaprine (FLEXERIL) 5 MG tablet Take 1 tablet (5 mg total) by mouth 3 (three) times daily as needed for muscle spasms. 02/02/17  Yes Tresa Garter, MD  hydrOXYzine (ATARAX/VISTARIL) 25 MG tablet Take 1 tablet (25 mg total) by mouth 3 (three) times daily as needed. 03/02/17  Yes Clent Demark, PA-C  loratadine (CLARITIN) 10 MG tablet Take 10 mg by mouth daily.   Yes [provider]  meloxicam (MOBIC) 7.5 MG tablet Take 7.5 mg by mouth 2 (two) times daily.   Yes [provider]  mirtazapine (REMERON) 30 MG tablet Take 1 tablet (30 mg total) by mouth at bedtime. 02/02/17  Yes Tresa Garter, MD  ranitidine (ZANTAC) 150 MG tablet Take 1 tablet (150 mg total) by mouth 2 (two) times daily. 03/02/17  Yes Clent Demark, PA-C  Vitamin D, Ergocalciferol, (DRISDOL) 50000 units CAPS capsule Take 1 capsule (50,000 Units total) by mouth every 7 (seven) days. 03/02/17  Yes Clent Demark, PA-C  atenolol (  TENORMIN) 50 MG tablet Take 50 mg by mouth daily.    [provider]  risperiDONE (RISPERDAL) 0.5 MG tablet Take 1 tablet (0.5 mg total) by mouth at bedtime. 03/02/17   Clent Demark, PA-C    Family History History reviewed. No pertinent family history.  Social History       Social History  Substance Use Topics  . Smoking status: Current Every Day Smoker    Packs/day: 0.10    Types: Cigarettes  . Smokeless tobacco: Never Used  . Alcohol use No     Comment: sober since 02-2016     Allergies           Codeine   Review of Systems Review of Systems  Reason unable to  perform ROS: See HPI as above.     Physical Exam Triage Vital Signs     ED Triage Vitals  Enc Vitals Group     BP 03/09/17 1739 125/82     Pulse Rate 03/09/17 1739 (!) 107     Resp --      Temp 03/09/17 1739 98.8 F (37.1 C)     Temp Source 03/09/17 1739 Oral     SpO2 03/09/17 1739 99 %     Weight --      Height --      Head Circumference --      Peak Flow --      Pain Score 03/09/17 1741 0     Pain Loc --      Pain Edu? --      Excl. in [place]? --    No data found.   Updated Vital Signs BP 125/82 (BP Location: Left Arm)   Pulse (!) 107   Temp 98.8 F (37.1 C) (Oral)   LMP 03/01/2017   SpO2 99%   Visual Acuity Right Eye Distance:      Left Eye Distance:         Bilateral Distance:          Right Eye Near:            Left Eye Near:               Bilateral Near:                Physical Exam  Constitutional: She is oriented to person, place, and time. She appears well-developed and well-nourished. No distress.  HENT:  Head: Normocephalic and atraumatic.  Eyes: Pupils are equal, round, and reactive to light. Conjunctivae are normal.  Cardiovascular: Normal rate, regular rhythm and normal heart sounds.  Exam reveals no gallop and no friction rub.   No murmur heard. Pulmonary/Chest: Effort normal and breath sounds normal. She has no wheezes. She has no rales.  Abdominal: Soft. Bowel sounds are normal. She exhibits no mass. There is tenderness (generalized.). There is no rebound, no guarding and no CVA tenderness.  Neurological: She is alert and oriented to person, place, and time.  Skin: Skin is warm and dry.  Psychiatric: She has a normal mood and affect. Her behavior is normal. Judgment normal.     UC Treatments / Results  Labs (all labs ordered are listed, but only abnormal results are displayed)      Labs Reviewed  POCT URINALYSIS DIP (DEVICE) - Abnormal; Notable for the following:       Result Value    Hgb urine dipstick TRACE (*)      All other components within normal limits  POCT PREGNANCY,  URINE    EKG      EKG Interpretation None       Radiology ImagingResults(Last48hours)  No results found.    Procedures Procedures (including critical care time)  Medications Ordered in UC Medications - No data to display   Initial Impression / Assessment and Plan / UC Course  I have reviewed the triage vital signs and the nursing notes.  Pertinent labs & imaging results that were available during my care of the patient were reviewed by me and considered in my medical decision making (see chart for details).  Discussed urine results with patient, negative pregnancy test. Discussed possible movement caused by constipation given last BM 4 days ago. Patient stating "I know I'm pregnant", and that "I have had four kids even though they told me I couldn't get pregnant". Patient left prior to finishing discussion, she is in stable condition.   Final Clinical Impressions(s) / UC Diagnoses   Final diagnoses:  Generalized abdominal pain    New Prescriptions Discharge Medication List as of 03/09/2017  6:28 PM         Ok Edwards, PA-C 03/09/17 1834     Other Notes    All notes  Additional Orders and Documentation      Results        Meds        Orders        Flowsheets     Encounter Info:   History,   Allergies,   Detailed Report     Media   Electronic signature on 03/09/2017 5:10 PM   Clinical Impressions      Generalized abdominal pain  Disposition      Discharge   Medication Changes     None    Medication List at Discharge  Care Timeline   6269  Arrived  1756  POCT urinalysis dip (device)   1800  Pregnancy, urine POC  1828  Discharged     Laboratory analysis     Outpatient Medications Prior to Visit  Medication Sig Dispense Refill  . atenolol (TENORMIN) 50 MG tablet Take 50 mg by mouth daily.    . cyclobenzaprine (FLEXERIL) 5 MG  tablet Take 1 tablet (5 mg total) by mouth 3 (three) times daily as needed for muscle spasms. 30 tablet 2  . hydrOXYzine (ATARAX/VISTARIL) 25 MG tablet Take 1 tablet (25 mg total) by mouth 3 (three) times daily as needed. 90 tablet 3  . loratadine (CLARITIN) 10 MG tablet Take 10 mg by mouth daily.    . meloxicam (MOBIC) 7.5 MG tablet Take 7.5 mg by mouth 2 (two) times daily.    . mirtazapine (REMERON) 30 MG tablet Take 1 tablet (30 mg total) by mouth at bedtime. 30 tablet 3  . ranitidine (ZANTAC) 150 MG tablet Take 1 tablet (150 mg total) by mouth 2 (two) times daily. 60 tablet 0  . risperiDONE (RISPERDAL) 0.5 MG tablet Take 1 tablet (0.5 mg total) by mouth at bedtime. 30 tablet 2  . Vitamin D, Ergocalciferol, (DRISDOL) 50000 units CAPS capsule Take 1 capsule (50,000 Units total) by mouth every 7 (seven) days. 12 capsule 1   No facility-administered medications prior to visit.      ROS ROS  Objective:  LMP 03/01/2017   BP/Weight 03/09/2017 03/02/2017 4/85/4627  Systolic BP 035 009 381  Diastolic BP 82 96 86  Wt. (Lbs) - 164 155.2  BMI - 31.37 29.68      Physical Exam   Assessment &  Plan:

## 2017-03-13 LAB — BETA HCG QUANT (REF LAB): hCG Quant: <1 m[IU]/mL

## 2017-03-31 ENCOUNTER — Other Ambulatory Visit (INDEPENDENT_AMBULATORY_CARE_PROVIDER_SITE_OTHER): Payer: Self-pay | Admitting: Physician Assistant

## 2017-03-31 ENCOUNTER — Encounter (INDEPENDENT_AMBULATORY_CARE_PROVIDER_SITE_OTHER): Payer: Self-pay | Admitting: Physician Assistant

## 2017-03-31 ENCOUNTER — Ambulatory Visit (INDEPENDENT_AMBULATORY_CARE_PROVIDER_SITE_OTHER): Payer: PRIVATE HEALTH INSURANCE | Admitting: Physician Assistant

## 2017-03-31 VITALS — BP 114/83 | HR 86 | Temp 98.2°F | Wt 159.6 lb

## 2017-03-31 DIAGNOSIS — F319 Bipolar disorder, unspecified: Secondary | ICD-10-CM

## 2017-03-31 DIAGNOSIS — Z3200 Encounter for pregnancy test, result unknown: Secondary | ICD-10-CM | POA: Diagnosis not present

## 2017-03-31 NOTE — Patient Instructions (Signed)
Bipolar 1 Disorder Bipolar 1 disorder is a mental health disorder in which a person has episodes of emotional highs (mania), and may also have episodes of emotional lows (depression) in addition to highs. Bipolar 1 disorder is different from other bipolar disorders because it involves extreme manic episodes. These episodes last at least one week or involve symptoms that are so severe that hospitalization is needed to keep the person safe. What increases the risk? The cause of this condition is not known. However, certain factors make you more likely to have bipolar disorder, such as:  Having a family member with the disorder.  An imbalance of certain chemicals in the brain (neurotransmitters).  Stress, such as illness, financial problems, or a death.  Certain conditions that affect the brain or spinal cord (neurologic conditions).  Brain injury (trauma).  Having another mental health disorder, such as: ? Obsessive compulsive disorder. ? Schizophrenia.  What are the signs or symptoms? Symptoms of mania include:  Very high self-esteem or self-confidence.  Decreased need for sleep.  Unusual talkativeness or feeling a need to keep talking. Speech may be very fast. It may seem like you cannot stop talking.  Racing thoughts or constant talking, with quick shifts between topics that may or may not be related (flight of ideas).  Decreased ability to focus or concentrate.  Increased purposeful activity, such as work, studies, or social activity.  Increased nonproductive activity. This could be pacing, squirming and fidgeting, or finger and toe tapping.  Impulsive behavior and poor judgment. This may result in high-risk activities, such as having unprotected sex or spending a lot of money.  Symptoms of depression include:  Feeling sad, hopeless, or helpless.  Frequent or uncontrollable crying.  Lack of feeling or caring about anything.  Sleeping too much.  Moving more slowly  than usual.  Not being able to enjoy things you used to enjoy.  Wanting to be alone all the time.  Feeling guilty or worthless.  Lack of energy or motivation.  Trouble concentrating or remembering.  Trouble making decisions.  Increased appetite.  Thoughts of death, or the desire to harm yourself.  Sometimes, you may have a mixed mood. This means having symptoms of depression and mania. Stress can make symptoms worse. How is this diagnosed? To diagnose bipolar disorder, your health care provider may ask about your:  Emotional episodes.  Medical history.  Alcohol and drug use. This includes prescription medicines. Certain medical conditions and substances can cause symptoms that seem like bipolar disorder (secondary bipolar disorder).  How is this treated? Bipolar disorder is a long-term (chronic) illness. It is best controlled with ongoing (continuous) treatment rather than treatment only when symptoms occur. Treatment may include:  Medicine. Medicine can be prescribed by a provider who specializes in treating mental disorders (psychiatrist). ? Medicines called mood stabilizers are usually prescribed. ? If symptoms occur even while taking a mood stabilizer, other medicines may be added.  Psychotherapy. Some forms of talk therapy, such as cognitive-behavioral therapy (CBT), can provide support, education, and guidance.  Coping methods, such as journaling or relaxation exercises. These may include: ? Yoga. ? Meditation. ? Deep breathing.  Lifestyle changes, such as: ? Limiting alcohol and drug use. ? Exercising regularly. ? Getting plenty of sleep. ? Making healthy eating choices.  A combination of medicine, talk therapy, and coping methods is best. A procedure in which electricity is applied to the brain through the scalp (electroconvulsive therapy) may be used in cases of severe mania when medicine   and psychotherapy work too slowly or do not work. Follow these  instructions at home: Activity   Return to your normal activities as told by your health care provider.  Find activities that you enjoy, and make time to do them.  Exercise regularly as told by your health care provider. Lifestyle  Limit alcohol intake to no more than 1 drink a day for nonpregnant women and 2 drinks a day for men. One drink equals 12 oz of beer, 5 oz of wine, or 1 oz of hard liquor.  Follow a set schedule for eating and sleeping.  Eat a balanced diet that includes fresh fruits and vegetables, whole grains, low-fat dairy, and lean meat.  Get 7-8 hours of sleep each night. General instructions  Take over-the-counter and prescription medicines only as told by your health care provider.  Think about joining a support group. Your health care provider may be able to recommend a support group.  Talk with your family and loved ones about your treatment goals and how they can help.  Keep all follow-up visits as told by your health care provider. This is important. Where to find more information: For more information about bipolar disorder, visit the following websites:  National Alliance on Mental Illness: www.nami.org  U.S. National Institute of Mental Health: www.nimh.nih.gov  Contact a health care provider if:  Your symptoms get worse.  You have side effects from your medicine, and they get worse.  You have trouble sleeping.  You have trouble doing daily activities.  You feel unsafe in your surroundings.  You are dealing with substance abuse. Get help right away if:  You have new symptoms.  You have thoughts about harming yourself.  You self-harm. This information is not intended to replace advice given to you by your health care provider. Make sure you discuss any questions you have with your health care provider. Document Released: 09/29/2000 Document Revised: 02/17/2016 Document Reviewed: 02/21/2016 Elsevier Interactive Patient Education  2018  Elsevier Inc.  

## 2017-03-31 NOTE — Progress Notes (Signed)
Subjective:  Patient ID: Theresa Mccoy, female    DOB: [birth year]  Age: 36 y.o. MRN: [id number]  CC: f/u bipolar and anxiety  HPI  [place] a 36 y.o.femalewith a medical history of HTN, anemia, hyperthyroidism, bipolar disorder, and anxiety presents to f/u on anxiety and bipolar disorder. Not taking medications because pt is convinced that she is pregnant. Says the fetuses are moving and that there are four fetuses. Celesta Gentile also agrees to feeling the fetuses move. Patient has been referred to OB/GYN but patient has made an appointment elsewhere. Will be seen by OB/GYN in the last week of October. No other complaints or symptoms to report.    Outpatient Medications Prior to Visit  Medication Sig Dispense Refill  . loratadine (CLARITIN) 10 MG tablet Take 10 mg by mouth daily.    . meloxicam (MOBIC) 7.5 MG tablet Take 7.5 mg by mouth 2 (two) times daily.    . ranitidine (ZANTAC) 150 MG tablet Take 1 tablet (150 mg total) by mouth 2 (two) times daily. 60 tablet 0  . atenolol (TENORMIN) 50 MG tablet Take 50 mg by mouth daily.    . cyclobenzaprine (FLEXERIL) 5 MG tablet Take 1 tablet (5 mg total) by mouth 3 (three) times daily as needed for muscle spasms. (Patient not taking: Reported on 03/31/2017) 30 tablet 2  . hydrOXYzine (ATARAX/VISTARIL) 25 MG tablet Take 1 tablet (25 mg total) by mouth 3 (three) times daily as needed. (Patient not taking: Reported on 03/31/2017) 90 tablet 3  . mirtazapine (REMERON) 30 MG tablet Take 1 tablet (30 mg total) by mouth at bedtime. (Patient not taking: Reported on 03/31/2017) 30 tablet 3  . risperiDONE (RISPERDAL) 0.5 MG tablet Take 1 tablet (0.5 mg total) by mouth at bedtime. (Patient not taking: Reported on 03/31/2017) 30 tablet 2  . Vitamin D, Ergocalciferol, (DRISDOL) 50000 units CAPS capsule Take 1 capsule (50,000 Units total) by mouth every 7 (seven) days. (Patient not taking: Reported on 03/31/2017) 12 capsule 1   No facility-administered medications  prior to visit.      ROS Review of Systems  Constitutional: Negative for chills, fever and malaise/fatigue.  Eyes: Negative for blurred vision.  Respiratory: Negative for shortness of breath.   Cardiovascular: Negative for chest pain and palpitations.  Gastrointestinal: Negative for abdominal pain and nausea.  Genitourinary: Negative for dysuria and hematuria.  Musculoskeletal: Negative for joint pain and myalgias.  Skin: Negative for rash.  Neurological: Negative for tingling and headaches.  Psychiatric/Behavioral: Negative for depression. The patient is not nervous/anxious.     Objective:  BP 114/83 (BP Location: Right Arm, Patient Position: Sitting, Cuff Size: Large)   Pulse 86   Temp 98.2 F (36.8 C) (Oral)   Wt 159 lb 9.6 oz (72.4 kg)   LMP 03/27/2017 (Exact Date) Comment: 2017 full period  SpO2 96%   BMI 27.40 kg/m   BP/Weight 03/31/2017 0/0/9381 02/06/9936  Systolic BP 169 678 938  Diastolic BP 83 83 82  Wt. (Lbs) 159.6 165 -  BMI 27.4 28.32 -      Physical Exam  Constitutional: She is oriented to person, place, and time.  Well developed, well nourished, NAD, polite  HENT:  Head: Normocephalic and atraumatic.  Eyes: No scleral icterus.  Neck: Normal range of motion. Neck supple. No thyromegaly present.  Cardiovascular: Normal rate, regular rhythm and normal heart sounds.   Pulmonary/Chest: Effort normal and breath sounds normal.  Abdominal: Soft. Bowel sounds are normal. There is no tenderness.  Musculoskeletal:  She exhibits no edema.  Neurological: She is alert and oriented to person, place, and time.  Skin: Skin is warm and dry. No rash noted. No erythema. No pallor.  Psychiatric: She has a normal mood and affect. Her behavior is normal. Thought content normal.  Vitals reviewed.    Assessment & Plan:    1. Bipolar affective disorder, remission status unspecified ([place]) - Not taking medications because she believes she is pregnant. - uHCG negative on  03/09/17 and bHCG negative on 03/12/17. - US OB Transvaginal; Future - US OB Comp Less 14 weeks  2. Possible pregnancy - uHCG negative on 03/09/17 and bHCG negative on 03/12/17. - US OB Transvaginal; Future - US OB Comp Less 14 weeks - I have placed my hand were the "baby" was "kicking" several times during the exam. Patient stated babies stopped kicking when I placed my hand on pt's abdomen.    Follow-up: Return if symptoms worsen or fail to improve.   Clent Demark PA

## 2017-04-07 ENCOUNTER — Ambulatory Visit (HOSPITAL_COMMUNITY)
Admission: RE | Admit: 2017-04-07 | Discharge: 2017-04-07 | Disposition: A | Discharge status: Home / Self Care | Payer: PRIVATE HEALTH INSURANCE | Source: Ambulatory Visit | Attending: Physician Assistant | Admitting: Physician Assistant

## 2017-04-07 ENCOUNTER — Telehealth (INDEPENDENT_AMBULATORY_CARE_PROVIDER_SITE_OTHER): Payer: Self-pay

## 2017-04-07 ENCOUNTER — Other Ambulatory Visit (INDEPENDENT_AMBULATORY_CARE_PROVIDER_SITE_OTHER): Payer: Self-pay | Admitting: Physician Assistant

## 2017-04-07 DIAGNOSIS — F319 Bipolar disorder, unspecified: Secondary | ICD-10-CM | POA: Insufficient documentation

## 2017-04-07 DIAGNOSIS — N83202 Unspecified ovarian cyst, left side: Secondary | ICD-10-CM | POA: Insufficient documentation

## 2017-04-07 DIAGNOSIS — Z3200 Encounter for pregnancy test, result unknown: Secondary | ICD-10-CM

## 2017-04-07 NOTE — Telephone Encounter (Signed)
Patient aware of results but is not happy because the results do not explain the movement she is feeling. Assured patient that the movement is not coming from a fetus, but have no other solid explanation as to what is causing her to feel movement. Patient expressed understanding of results. Nat Christen, CMA

## 2017-04-07 NOTE — Telephone Encounter (Signed)
-----   Message from Clent Demark, PA-C sent at 04/07/2017  4:54 PM EDT ----- 4 cm benign appearing hemorrhagic cyst of the left ovary. No evidence of hemoperitoneum (blood collecting in the abdomen) or other significant abnormality.

## 2017-04-30 ENCOUNTER — Encounter (HOSPITAL_COMMUNITY): Payer: Self-pay | Admitting: Emergency Medicine

## 2017-04-30 ENCOUNTER — Emergency Department (HOSPITAL_COMMUNITY)
Admission: EM | Admit: 2017-04-30 | Discharge: 2017-05-01 | Disposition: A | Discharge status: Home / Self Care | Payer: PRIVATE HEALTH INSURANCE | Attending: Emergency Medicine | Admitting: Emergency Medicine

## 2017-04-30 DIAGNOSIS — F17210 Nicotine dependence, cigarettes, uncomplicated: Secondary | ICD-10-CM | POA: Diagnosis not present

## 2017-04-30 DIAGNOSIS — Z79899 Other long term (current) drug therapy: Secondary | ICD-10-CM | POA: Insufficient documentation

## 2017-04-30 DIAGNOSIS — E349 Endocrine disorder, unspecified: Secondary | ICD-10-CM | POA: Diagnosis not present

## 2017-04-30 DIAGNOSIS — R05 Cough: Secondary | ICD-10-CM

## 2017-04-30 DIAGNOSIS — I10 Essential (primary) hypertension: Secondary | ICD-10-CM | POA: Insufficient documentation

## 2017-04-30 DIAGNOSIS — R059 Cough, unspecified: Secondary | ICD-10-CM

## 2017-04-30 NOTE — ED Triage Notes (Signed)
Pt reports coughing and having pain in back and head for the last 2 days pt reports having productive cough. Pt also reporting vomiting.

## 2017-05-01 ENCOUNTER — Encounter (INDEPENDENT_AMBULATORY_CARE_PROVIDER_SITE_OTHER): Payer: Self-pay | Admitting: Physician Assistant

## 2017-05-01 ENCOUNTER — Emergency Department (HOSPITAL_COMMUNITY): Payer: PRIVATE HEALTH INSURANCE

## 2017-05-01 LAB — I-STAT BETA HCG BLOOD, ED (MC, WL, AP ONLY): I-stat hCG, quantitative: 7.7 m[IU]/mL — ABNORMAL HIGH (ref <5)

## 2017-05-01 LAB — I-STAT TROPONIN, ED: TROPONIN I, POC: 0 ng/mL (ref 0.00–0.08)

## 2017-05-01 NOTE — ED Notes (Signed)
Pt refused xrays

## 2017-05-01 NOTE — ED Notes (Signed)
Pt asleep in room.

## 2017-05-01 NOTE — ED Provider Notes (Signed)
[facility] DEPT Provider Note   CSN: [id number] Arrival date & time: 04/30/17  2156     History   Chief Complaint Chief Complaint  Patient presents with  . Cough  . Back Pain  . Emesis    HPI Theresa Mccoy is a 36 y.o. female.  Patient presents to the emergency department with multiple complaints. She reports cough with production of mucus. She has had sinus congestion with frontal headache but no sore throat and no fever. Symptoms started over the last couple of days. She also reports generalized abdominal pain, back pain and chest pain for "months" and "I feel movement". When asked to clarify she states she feels she is pregnant and feels the baby moving inside her. She has had negative pregnancy tests at home and with primary care. She reports she does not believe these tests because they were negative until the 6th or 7th month of her previous pregnancies. She is a Actor. No vaginal bleeding or discharge. She has nausea with one episode vomiting yesterday.    The history is provided by the patient. No language interpreter was used.  Cough  Associated symptoms include chest pain and headaches (Frontal). Pertinent negatives include no chills, no sore throat and no shortness of breath.  Back Pain   Associated symptoms include chest pain, headaches (Frontal) and abdominal pain. Pertinent negatives include no fever, no dysuria and no weakness.  Emesis   Associated symptoms include abdominal pain, cough and headaches (Frontal). Pertinent negatives include no chills and no fever.    Past Medical History:  Diagnosis Date  . Anemia   . Hyperthyroidism   . Thyroid disease     Patient Active Problem List   Diagnosis Date Noted  . Muscle cramps 02/02/2017  . Bipolar disorder, current episode mixed, moderate ([place]) 02/02/2017  . Generalized abdominal pain 02/02/2017  . Generalized anxiety disorder 02/02/2017  . Essential hypertension 02/02/2017     Past Surgical History:  Procedure Laterality Date  . CESAREAN SECTION    . HAND SURGERY Left     OB History    No data available       Home Medications    Prior to Admission medications   Medication Sig Start Date End Date Taking? Authorizing Provider  atenolol (TENORMIN) 50 MG tablet Take 50 mg by mouth daily.    [provider]  cyclobenzaprine (FLEXERIL) 5 MG tablet Take 1 tablet (5 mg total) by mouth 3 (three) times daily as needed for muscle spasms. Patient not taking: Reported on 03/31/2017 02/02/17   Tresa Garter, MD  hydrOXYzine (ATARAX/VISTARIL) 25 MG tablet Take 1 tablet (25 mg total) by mouth 3 (three) times daily as needed. Patient not taking: Reported on 03/31/2017 03/02/17   Clent Demark, PA-C  loratadine (CLARITIN) 10 MG tablet Take 10 mg by mouth daily.    [provider]  meloxicam (MOBIC) 7.5 MG tablet Take 7.5 mg by mouth 2 (two) times daily.    [provider]  mirtazapine (REMERON) 30 MG tablet Take 1 tablet (30 mg total) by mouth at bedtime. Patient not taking: Reported on 03/31/2017 02/02/17   Tresa Garter, MD  ranitidine (ZANTAC) 150 MG tablet Take 1 tablet (150 mg total) by mouth 2 (two) times daily. 03/02/17   Clent Demark, PA-C  risperiDONE (RISPERDAL) 0.5 MG tablet Take 1 tablet (0.5 mg total) by mouth at bedtime. Patient not taking: Reported on 03/31/2017 03/02/17   Clent Demark, PA-C  Vitamin D, Ergocalciferol, (DRISDOL) 50000 units CAPS capsule Take 1 capsule (50,000 Units total) by mouth every 7 (seven) days. Patient not taking: Reported on 03/31/2017 03/02/17   Clent Demark, PA-C    Family History History reviewed. No pertinent family history.  Social History Social History  Substance Use Topics  . Smoking status: Current Every Day Smoker    Packs/day: 0.25    Types: Cigarettes  . Smokeless tobacco: Never Used  . 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  HENT: Positive for congestion and sinus pain. Negative for sore throat.   Respiratory: Positive for cough. Negative for shortness of breath.   Cardiovascular: Positive for chest pain.  Gastrointestinal: Positive for abdominal pain, nausea and vomiting.  Genitourinary: Negative.  Negative for dysuria, vaginal bleeding and vaginal discharge.  Musculoskeletal: Positive for back pain.  Skin: Negative.   Neurological: Positive for headaches (Frontal). Negative for syncope and weakness.     Physical Exam Updated Vital Signs BP 100/86 (BP Location: Left Arm)   Pulse 71   Temp 98.3 F (36.8 C) (Oral)   Resp 16   Ht 5' (1.524 m)   Wt 76.7 kg (169 lb)   LMP 04/22/2017   SpO2 100%   BMI 33.01 kg/m   Physical Exam  Constitutional: She is oriented to person, place, and time. She appears well-developed and well-nourished.  HENT:  Head: Normocephalic.  Nose: Nose normal.  Mouth/Throat: Oropharynx is clear and moist.  Neck: Normal range of motion. Neck supple.  Cardiovascular: Normal rate and regular rhythm.   No murmur heard. Pulmonary/Chest: Effort normal and breath sounds normal. She has no wheezes. She has no rales.  Abdominal: Soft. Bowel sounds are normal. She exhibits no distension. There is no tenderness. There is no rebound and no guarding.  Musculoskeletal: Normal range of motion. She exhibits no edema.  Neurological: She is alert and oriented to person, place, and time.  Skin: Skin is warm and dry. No rash noted.  Psychiatric: She has a normal mood and affect.     ED Treatments / Results  Labs (all labs ordered are listed, but only abnormal results are displayed) Labs Reviewed  I-STAT BETA HCG BLOOD, ED ([facility], [facility], [facility] ONLY)    EKG  EKG Interpretation None       Radiology No results found.  Procedures Procedures (including critical care time)  Medications  Ordered in ED Medications - No data to display   Initial Impression / Assessment and Plan / ED Course  I have reviewed the triage vital signs and the nursing notes.  Pertinent labs & imaging results that were available during my care of the patient were reviewed by me and considered in my medical decision making (see chart for details).     Patient presents with chronic chest, back and abdominal pain, unchanged today.   She is convinced she is pregnant despite multiple negative tests because she feels movement like when she was carrying her children.  She is requesting a pregnancy test which was ordered. No vaginal symptoms and abdominal exam is benign. No further work up needed here.  Cough will be evaluated with CXR which is pending.   3:05 - On follow up as to CXR being done, I was informed the patient declined/refused the x-ray.   3:50 - On recheck, the patient is  sleeping soundly. VSS. I-stat beta Hcg 7.7. This was discussed with husband and follow up for further testing was encouraged to determine its accuracy. She can be discharged home.   Final Clinical Impressions(s) / ED Diagnoses   Final diagnoses:  None   1. Cough 2. Positive Hcg  New Prescriptions New Prescriptions   No medications on file     Charlann Lange, Hershal Coria 05/01/17 [place], Delice Bison, DO 05/01/17 [phone]

## 2017-05-01 NOTE — ED Notes (Signed)
Pt is c/o headache, back ache, abd pain   Pt states pain worse today

## 2017-05-01 NOTE — Discharge Instructions (Signed)
Follow up with your doctor for recheck of elevated Hcg test to determine if this represents a positive pregnancy test. You can use plain saline nasal sprays for relief of draining sinuses that is likely causing your cough.

## 2017-05-05 ENCOUNTER — Other Ambulatory Visit: Payer: Self-pay | Admitting: Obstetrics and Gynecology

## 2017-05-05 ENCOUNTER — Other Ambulatory Visit (HOSPITAL_COMMUNITY)
Admission: RE | Admit: 2017-05-05 | Discharge: 2017-05-05 | Disposition: A | Discharge status: Home / Self Care | Payer: PRIVATE HEALTH INSURANCE | Source: Ambulatory Visit | Attending: Obstetrics and Gynecology | Admitting: Obstetrics and Gynecology

## 2017-05-05 DIAGNOSIS — Z124 Encounter for screening for malignant neoplasm of cervix: Secondary | ICD-10-CM | POA: Insufficient documentation

## 2017-05-06 LAB — CYTOLOGY - PAP
DIAGNOSIS: NEGATIVE
HPV: NOT DETECTED

## 2017-08-06 ENCOUNTER — Encounter (HOSPITAL_COMMUNITY): Payer: Self-pay | Admitting: Emergency Medicine

## 2017-08-06 ENCOUNTER — Other Ambulatory Visit: Payer: Self-pay

## 2017-08-06 DIAGNOSIS — J019 Acute sinusitis, unspecified: Secondary | ICD-10-CM | POA: Insufficient documentation

## 2017-08-06 DIAGNOSIS — J069 Acute upper respiratory infection, unspecified: Secondary | ICD-10-CM | POA: Insufficient documentation

## 2017-08-06 DIAGNOSIS — I10 Essential (primary) hypertension: Secondary | ICD-10-CM | POA: Diagnosis not present

## 2017-08-06 DIAGNOSIS — F17210 Nicotine dependence, cigarettes, uncomplicated: Secondary | ICD-10-CM | POA: Insufficient documentation

## 2017-08-06 DIAGNOSIS — E039 Hypothyroidism, unspecified: Secondary | ICD-10-CM | POA: Diagnosis not present

## 2017-08-06 DIAGNOSIS — J029 Acute pharyngitis, unspecified: Secondary | ICD-10-CM | POA: Diagnosis present

## 2017-08-06 NOTE — ED Triage Notes (Signed)
Pt arriving from home with cold symptoms x3 weeks. Pt reports productive cough with yellow sputum. Pt reports she has not taken any medications at home.

## 2017-08-07 ENCOUNTER — Emergency Department (HOSPITAL_COMMUNITY): Payer: Medicaid Other

## 2017-08-07 ENCOUNTER — Emergency Department (HOSPITAL_COMMUNITY)
Admission: EM | Admit: 2017-08-07 | Discharge: 2017-08-07 | Disposition: A | Discharge status: Home / Self Care | Payer: Medicaid Other | Attending: Emergency Medicine | Admitting: Emergency Medicine

## 2017-08-07 DIAGNOSIS — R05 Cough: Secondary | ICD-10-CM

## 2017-08-07 DIAGNOSIS — J069 Acute upper respiratory infection, unspecified: Secondary | ICD-10-CM

## 2017-08-07 DIAGNOSIS — R059 Cough, unspecified: Secondary | ICD-10-CM

## 2017-08-07 DIAGNOSIS — J0100 Acute maxillary sinusitis, unspecified: Secondary | ICD-10-CM

## 2017-08-07 MED ORDER — AEROCHAMBER PLUS W/MASK MISC: 2 refills | Status: DC

## 2017-08-07 MED ORDER — AMOXICILLIN-POT CLAVULANATE 875-125 MG PO TABS: 1.0000 | ORAL_TABLET | Freq: Two times a day (BID) | ORAL | 0 refills | Status: DC

## 2017-08-07 MED ORDER — ALBUTEROL SULFATE HFA 108 (90 BASE) MCG/ACT IN AERS: 2.0000 | INHALATION_SPRAY | RESPIRATORY_TRACT | 3 refills | Status: DC | PRN

## 2017-08-07 NOTE — ED Notes (Signed)
Pt called x2 for room placement

## 2017-08-07 NOTE — ED Notes (Signed)
Bed: [facility] Expected date:  Expected time:  Means of arrival:  Comments: 

## 2017-08-07 NOTE — ED Provider Notes (Signed)
[facility] DEPT Provider Note   CSN: [id number] Arrival date & time: 08/06/17  2156     History   Chief Complaint Chief Complaint  Patient presents with  . Sore Throat  . Cough    HPI Theresa Mccoy is a 37 y.o. female with a hx of anemia, thyroid disease presents to the Emergency Department complaining of gradual, persistent, progressively worsening URI symptoms onset approximately 10 days ago. Associated symptoms include congestion, rhinorrhea, postnasal drip, sore throat, cough.  Patient reports in the last 3 days she has developed a severe frontal headache and sinus tenderness.  She reports that the sputum from her productive cough has changed from clear to brown.  She denies fevers or chills, neck pain or neck stiffness, chest pain, shortness of breath, abdominal pain, nausea, vomiting, diarrhea, weakness, dizziness, syncope.  No treatments prior to arrival.  Nothing seems to make her symptoms better or worse.  The history is provided by the patient and medical records. No language interpreter was used.    Past Medical History:  Diagnosis Date  . Anemia   . Hyperthyroidism   . Thyroid disease     Patient Active Problem List   Diagnosis Date Noted  . Muscle cramps 02/02/2017  . Bipolar disorder, current episode mixed, moderate ([place]) 02/02/2017  . Generalized abdominal pain 02/02/2017  . Generalized anxiety disorder 02/02/2017  . Essential hypertension 02/02/2017    Past Surgical History:  Procedure Laterality Date  . CESAREAN SECTION    . HAND SURGERY Left     OB History    No data available       Home Medications    Prior to Admission medications   Medication Sig Start Date End Date Taking? Authorizing Provider  albuterol (PROVENTIL HFA;VENTOLIN HFA) 108 (90 Base) MCG/ACT inhaler Inhale 2 puffs into the lungs every 4 (four) hours as needed for wheezing or shortness of breath. 08/07/17   Wells Gerdeman, Jarrett Soho, PA-C    amoxicillin-clavulanate (AUGMENTIN) 875-125 MG tablet Take 1 tablet by mouth 2 (two) times daily. One po bid x 7 days 08/07/17   Hartlyn Reigel, Jarrett Soho, PA-C  Spacer/Aero-Holding Chambers (AEROCHAMBER PLUS WITH MASK) inhaler Use as instructed 08/07/17   Sharlee Rufino, Jarrett Soho, PA-C    Family History No family history on file.  Social History Social History   Tobacco Use  . Smoking status: Current Every Day Smoker    Packs/day: 0.25    Types: Cigarettes  . Smokeless tobacco: Never Used  Substance Use Topics  . 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 Drug use: Yes    Types: Marijuana     Allergies   Codeine   Review of Systems Review of Systems  Constitutional: Positive for fatigue. Negative for appetite change, chills and fever.  HENT: Positive for congestion, ear pain, postnasal drip, rhinorrhea, sinus pressure, sinus pain and sore throat. Negative for ear discharge and mouth sores.   Eyes: Negative for visual disturbance.  Respiratory: Positive for cough. Negative for chest tightness, shortness of breath, wheezing and stridor.   Cardiovascular: Negative for chest pain, palpitations and leg swelling.  Gastrointestinal: Negative for abdominal pain, diarrhea, nausea and vomiting.  Genitourinary: Negative for dysuria, frequency, hematuria and urgency.  Musculoskeletal: Negative for arthralgias, back pain, myalgias and neck stiffness.  Skin: Negative for rash.  Neurological: Positive for headaches. Negative for syncope, light-headedness and numbness.  Hematological: Negative for adenopathy.  Psychiatric/Behavioral: The patient is not nervous/anxious.   All other systems reviewed and  are negative.    Physical Exam Updated Vital Signs BP (!) 115/92 (BP Location: Left Arm)   Pulse 95   Temp 99.6 F (37.6 C) (Oral)   Resp 20   Ht 5' (1.524 m)   Wt 68 kg (150 lb)   LMP 08/04/2017   SpO2 98%   BMI 29.29 kg/m   Physical Exam  Constitutional: She appears  well-developed and well-nourished. No distress.  HENT:  Head: Normocephalic and atraumatic.  Right Ear: Tympanic membrane, external ear and ear canal normal.  Left Ear: Tympanic membrane, external ear and ear canal normal.  Nose: Mucosal edema and rhinorrhea present. No epistaxis. Right sinus exhibits maxillary sinus tenderness and frontal sinus tenderness. Left sinus exhibits maxillary sinus tenderness and frontal sinus tenderness.  Mouth/Throat: Uvula is midline and mucous membranes are normal. Mucous membranes are not pale and not cyanotic. No oropharyngeal exudate, posterior oropharyngeal edema, posterior oropharyngeal erythema or tonsillar abscesses.  Eyes: Conjunctivae are normal. Pupils are equal, round, and reactive to light.  Neck: Normal range of motion and full passive range of motion without pain.  Cardiovascular: Normal rate and intact distal pulses.  Pulmonary/Chest: Effort normal. No stridor. She has decreased breath sounds.  Generally coarse and slightly diminished but equal breath sounds without focal wheezes, rhonchi, rales  Abdominal: Soft. There is no tenderness.  Musculoskeletal: Normal range of motion.  Lymphadenopathy:    She has no cervical adenopathy.  Neurological: She is alert.  Skin: Skin is warm and dry. No rash noted. She is not diaphoretic.  Psychiatric: She has a normal mood and affect.  Nursing note and vitals reviewed.    ED Treatments / Results   Radiology Dg Chest 2 View  Result Date: 08/07/2017 CLINICAL DATA:  37 y/o F; cough, chest tightness, sinus pain for 1 week. EXAM: CHEST  2 VIEW COMPARISON:  07/20/2015 chest radiograph FINDINGS: Stable heart size and mediastinal contours are within normal limits. Both lungs are clear. The visualized skeletal structures are unremarkable. IMPRESSION: No active cardiopulmonary disease. Electronically Signed   By: Kristine Garbe M.D.   On: 08/07/2017 05:53    Procedures Procedures (including critical  care time)  Medications Ordered in ED Medications - No data to display   Initial Impression / Assessment and Plan / ED Course  I have reviewed the triage vital signs and the nursing notes.  Pertinent labs & imaging results that were available during my care of the patient were reviewed by me and considered in my medical decision making (see chart for details).  Clinical Course as of Aug 07 602  Fri Aug 07, 2017  0559 No acute abnormalities including no evidence of pneumonia, pneumothorax or pulmonary edema.  I have personally evaluated the images. DG Chest 2 View [HM]    Clinical Course User Index [HM] Deeana Atwater, Jarrett Soho, [place]    Patient complaining of symptoms of sinusitis.    Symptoms have been present for greater than 10 days with purulent nasal discharge and maxillary sinus pain.  Concern for acute bacterial rhinosinusitis.  Patient discharged with Augmentin.  Patient is also a smoker.  Will give albuterol for coarse breath sounds and cough.  Instructions given for warm saline nasal wash and recommendations for follow-up with primary care physician.     Final Clinical Impressions(s) / ED Diagnoses   Final diagnoses:  Viral upper respiratory tract infection  Cough  Acute non-recurrent maxillary sinusitis    ED Discharge Orders        Ordered  albuterol (PROVENTIL HFA;VENTOLIN HFA) 108 (90 Base) MCG/ACT inhaler  Every 4 hours PRN     08/07/17 0601    Spacer/Aero-Holding Chambers (AEROCHAMBER PLUS WITH MASK) inhaler     08/07/17 0601    amoxicillin-clavulanate (AUGMENTIN) 875-125 MG tablet  2 times daily     08/07/17 0601       Carmeron Heady, Gwenlyn Perking 08/07/17 5053    Molpus, Jenny Reichmann, MD 08/07/17 [phone]

## 2017-08-07 NOTE — Discharge Instructions (Signed)
1. Medications: Albuterol, Augmentin, usual home medications 2. Treatment: rest, drink plenty of fluids, take tylenol or ibuprofen for fever control 3. Follow Up: Please followup with your primary doctor in 3 days for discussion of your diagnoses and further evaluation after today's visit; if you do not have a primary care doctor use the resource guide provided to find one; Return to the ER for high fevers, difficulty breathing or other concerning symptoms

## 2018-02-17 ENCOUNTER — Inpatient Hospital Stay (HOSPITAL_COMMUNITY)
Admission: AD | Admit: 2018-02-17 | Discharge: 2018-02-18 | Disposition: A | Discharge status: Home / Self Care | Payer: Medicaid Other | Source: Ambulatory Visit | Attending: Obstetrics & Gynecology | Admitting: Obstetrics & Gynecology

## 2018-02-17 DIAGNOSIS — F17210 Nicotine dependence, cigarettes, uncomplicated: Secondary | ICD-10-CM | POA: Insufficient documentation

## 2018-02-17 DIAGNOSIS — O3680X0 Pregnancy with inconclusive fetal viability, not applicable or unspecified: Secondary | ICD-10-CM

## 2018-02-17 DIAGNOSIS — O209 Hemorrhage in early pregnancy, unspecified: Secondary | ICD-10-CM | POA: Insufficient documentation

## 2018-02-17 DIAGNOSIS — R109 Unspecified abdominal pain: Secondary | ICD-10-CM | POA: Insufficient documentation

## 2018-02-17 DIAGNOSIS — O4690 Antepartum hemorrhage, unspecified, unspecified trimester: Secondary | ICD-10-CM

## 2018-02-17 DIAGNOSIS — Z3A01 Less than 8 weeks gestation of pregnancy: Secondary | ICD-10-CM | POA: Insufficient documentation

## 2018-02-17 DIAGNOSIS — O99331 Smoking (tobacco) complicating pregnancy, first trimester: Secondary | ICD-10-CM | POA: Insufficient documentation

## 2018-02-17 DIAGNOSIS — O26891 Other specified pregnancy related conditions, first trimester: Secondary | ICD-10-CM

## 2018-02-17 HISTORY — DX: Depression, unspecified: F32.A

## 2018-02-17 HISTORY — DX: Major depressive disorder, single episode, unspecified: F32.9

## 2018-02-18 ENCOUNTER — Encounter (HOSPITAL_COMMUNITY): Payer: Self-pay | Admitting: *Deleted

## 2018-02-18 ENCOUNTER — Other Ambulatory Visit: Payer: Self-pay

## 2018-02-18 ENCOUNTER — Inpatient Hospital Stay (HOSPITAL_COMMUNITY): Payer: Medicaid Other

## 2018-02-18 DIAGNOSIS — R109 Unspecified abdominal pain: Secondary | ICD-10-CM

## 2018-02-18 DIAGNOSIS — O4691 Antepartum hemorrhage, unspecified, first trimester: Secondary | ICD-10-CM

## 2018-02-18 DIAGNOSIS — F17210 Nicotine dependence, cigarettes, uncomplicated: Secondary | ICD-10-CM | POA: Diagnosis not present

## 2018-02-18 DIAGNOSIS — O209 Hemorrhage in early pregnancy, unspecified: Secondary | ICD-10-CM | POA: Diagnosis not present

## 2018-02-18 DIAGNOSIS — Z3A01 Less than 8 weeks gestation of pregnancy: Secondary | ICD-10-CM | POA: Diagnosis not present

## 2018-02-18 DIAGNOSIS — O26891 Other specified pregnancy related conditions, first trimester: Secondary | ICD-10-CM

## 2018-02-18 DIAGNOSIS — O99331 Smoking (tobacco) complicating pregnancy, first trimester: Secondary | ICD-10-CM | POA: Diagnosis not present

## 2018-02-18 LAB — CBC
HCT: 34 % — ABNORMAL LOW (ref 36.0–46.0)
Hemoglobin: 11.7 g/dL — ABNORMAL LOW (ref 12.0–15.0)
MCH: 32.8 pg (ref 26.0–34.0)
MCHC: 34.4 g/dL (ref 30.0–36.0)
MCV: 95.2 fL (ref 78.0–100.0)
Platelets: 223 10*3/uL (ref 150–400)
RBC: 3.57 MIL/uL — ABNORMAL LOW (ref 3.87–5.11)
RDW: 11.8 % (ref 11.5–15.5)
WBC: 8.4 10*3/uL (ref 4.0–10.5)

## 2018-02-18 LAB — URINALYSIS, ROUTINE W REFLEX MICROSCOPIC
Bilirubin Urine: NEGATIVE
Glucose, UA: NEGATIVE mg/dL
Ketones, ur: 20 mg/dL — AB
Leukocytes, UA: NEGATIVE
Nitrite: NEGATIVE
Protein, ur: 30 mg/dL — AB
Specific Gravity, Urine: 1.028 (ref 1.005–1.030)
pH: 5 (ref 5.0–8.0)

## 2018-02-18 LAB — WET PREP, GENITAL
Sperm: NONE SEEN
Trich, Wet Prep: NONE SEEN
Yeast Wet Prep HPF POC: NONE SEEN

## 2018-02-18 LAB — ABO/RH: ABO/RH(D): O POS

## 2018-02-18 LAB — HCG, QUANTITATIVE, PREGNANCY: hCG, Beta Chain, Quant, S: 38 m[IU]/mL — ABNORMAL HIGH (ref <5)

## 2018-02-18 LAB — POCT PREGNANCY, URINE: PREG TEST UR: POSITIVE — AB

## 2018-02-18 LAB — GC/CHLAMYDIA PROBE AMP (~~LOC~~) NOT AT ARMC
Chlamydia: NEGATIVE
Neisseria Gonorrhea: NEGATIVE

## 2018-02-18 NOTE — MAU Provider Note (Signed)
Chief Complaint: Abdominal Pain   First Provider Initiated Contact with Patient 02/18/18 0036      SUBJECTIVE HPI: Theresa Mccoy is a 37 y.o. [id number] at [redacted]w[redacted]d by LMP who presents to maternity admissions via EMS reporting abdominal pain and vaginal bleeding. She reports abdominal pain that started yesterday, describes the abdominal pain as intermittent cramping. She reports abdominal cramping radiates to her back- rates pain 5/10- has not taken any medication for pain. She reports abdominal pain is associated with vaginal bleeding that started last night. She describes the vaginal bleeding as pink spotting- she reports vaginal bleeding is resolved at this time. She denies vaginal itching/burning, urinary symptoms, h/a, dizziness, n/v, or fever/chills.  She reports unprotected IC but does not remember date of IC. Patient currently lives in hotel with boyfriend.   Past Medical History:  Diagnosis Date  . Anemia   . Depression   . Hyperthyroidism   . Thyroid disease    Past Surgical History:  Procedure Laterality Date  . CESAREAN SECTION    . HAND SURGERY Left   . SALPINGECTOMY     Social History   Socioeconomic History  . Marital status: Single    Spouse name: Not on file  . Number of children: Not on file  . Years of education: Not on file  . Highest education level: Not on file  Occupational History  . Not on file  Social Needs  . Financial resource strain: Not on file  . Food insecurity:    Worry: Not on file    Inability: Not on file  . Transportation needs:    Medical: Not on file    Non-medical: Not on file  Tobacco Use  . Smoking status: Current Every Day Smoker    Packs/day: 0.25    Types: Cigarettes  . Smokeless tobacco: Never Used  Substance and Sexual Activity  . 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 Drug use: Yes    Types: Marijuana  . Sexual activity: Never  Lifestyle  . Physical activity:    Days per week: Not on file    Minutes per session:  Not on file  . Stress: Not on file  Relationships  . Social connections:    Talks on phone: Not on file    Gets together: Not on file    Attends religious service: Not on file    Active member of club or organization: Not on file    Attends meetings of clubs or organizations: Not on file    Relationship status: Not on file  . Intimate partner violence:    Fear of current or ex partner: Not on file    Emotionally abused: Not on file    Physically abused: Not on file    Forced sexual activity: Not on file  Other Topics Concern  . Not on file  Social History Narrative  . Not on file   No current facility-administered medications on file prior to encounter.    Current Outpatient Medications on File Prior to Encounter  Medication Sig Dispense Refill  . albuterol (PROVENTIL HFA;VENTOLIN HFA) 108 (90 Base) MCG/ACT inhaler Inhale 2 puffs into the lungs every 4 (four) hours as needed for wheezing or shortness of breath. 1 Inhaler 3  . amoxicillin-clavulanate (AUGMENTIN) 875-125 MG tablet Take 1 tablet by mouth 2 (two) times daily. One po bid x 7 days 14 tablet 0  . Spacer/Aero-Holding Chambers (AEROCHAMBER PLUS WITH MASK) inhaler Use as instructed 1 each  2   Allergies  Allergen Reactions  . Codeine     "it makes me not be able to go to the bathroom on my own"    ROS:  Review of Systems  Constitutional: Negative.   Respiratory: Negative.   Cardiovascular: Negative.   Gastrointestinal: Positive for abdominal pain. Negative for constipation, diarrhea, nausea and vomiting.  Genitourinary: Positive for vaginal bleeding. Negative for difficulty urinating, dysuria, frequency, pelvic pain and urgency.   I have reviewed patient's Past Medical Hx, Surgical Hx, Family Hx, Social Hx, medications and allergies.   Physical Exam   Patient Vitals for the past 24 hrs:  BP Temp Temp src Pulse Resp SpO2 Height Weight  02/18/18 0424 (!) 96/58 98.4 F (36.9 C) Oral 60 18 100 % - -  02/18/18 0009  113/77 97.9 F (36.6 C) Oral 79 16 100 % 5' (1.524 m) 57.6 kg   Constitutional: well-nourished female in no acute distress.  Cardiovascular: normal rate Respiratory: normal effort GI: Abd soft, non-tender. Pos BS x 4 MS: Extremities nontender, no edema, normal ROM Neurologic: Alert and oriented x 4.  GU: Neg CVAT.  PELVIC EXAM: Cervix pink, visually closed, without lesion, scant pink discharge noted with no odor, vaginal walls and external genitalia normal Bimanual exam: Cervix 0/long/high, firm, anterior, neg CMT, uterus nontender, nonenlarged, adnexa without tenderness, enlargement, or mass  LAB RESULTS Results for orders placed or performed during the hospital encounter of 02/17/18 (from the past 24 hour(s))  Urinalysis, Routine w reflex microscopic     Status: Abnormal   Collection Time: 02/18/18 12:27 AM  Result Value Ref Range   Color, Urine AMBER (A) YELLOW   APPearance HAZY (A) CLEAR   Specific Gravity, Urine 1.028 1.005 - 1.030   pH 5.0 5.0 - 8.0   Glucose, UA NEGATIVE NEGATIVE mg/dL   Hgb urine dipstick SMALL (A) NEGATIVE   Bilirubin Urine NEGATIVE NEGATIVE   Ketones, ur 20 (A) NEGATIVE mg/dL   Protein, ur 30 (A) NEGATIVE mg/dL   Nitrite NEGATIVE NEGATIVE   Leukocytes, UA NEGATIVE NEGATIVE   RBC / HPF 0-5 0 - 5 RBC/hpf   WBC, UA 11-20 0 - 5 WBC/hpf   Bacteria, UA RARE (A) NONE SEEN   Squamous Epithelial / LPF 0-5 0 - 5   Mucus PRESENT    Ca Oxalate Crys, UA PRESENT   Pregnancy, urine POC     Status: Abnormal   Collection Time: 02/18/18 12:33 AM  Result Value Ref Range   Preg Test, Ur POSITIVE (A) NEGATIVE  Wet prep, genital     Status: Abnormal   Collection Time: 02/18/18 12:47 AM  Result Value Ref Range   Yeast Wet Prep HPF POC NONE SEEN NONE SEEN   Trich, Wet Prep NONE SEEN NONE SEEN   Clue Cells Wet Prep HPF POC PRESENT (A) NONE SEEN   WBC, Wet Prep HPF POC MODERATE (A) NONE SEEN   Sperm NONE SEEN   CBC     Status: Abnormal   Collection Time: 02/18/18   1:14 AM  Result Value Ref Range   WBC 8.4 4.0 - 10.5 K/uL   RBC 3.57 (L) 3.87 - 5.11 MIL/uL   Hemoglobin 11.7 (L) 12.0 - 15.0 g/dL   HCT 34.0 (L) 36.0 - 46.0 %   MCV 95.2 78.0 - 100.0 fL   MCH 32.8 26.0 - 34.0 pg   MCHC 34.4 30.0 - 36.0 g/dL   RDW 11.8 11.5 - 15.5 %   Platelets 223 150 -  400 K/uL  ABO/Rh     Status: None   Collection Time: 02/18/18  1:14 AM  Result Value Ref Range   ABO/RH(D)      O POS Performed at [facility], [address], [place], [place] [address]   hCG, quantitative, pregnancy     Status: Abnormal   Collection Time: 02/18/18  1:14 AM  Result Value Ref Range   hCG, Beta Chain, Quant, S 38 (H) <5 mIU/mL    --/--/O POS Performed at [facility], [address] [place], [place] [address]  [phone])  IMAGING US Ob Less Than 14 Weeks With Ob Transvaginal  Result Date: 02/18/2018 CLINICAL DATA:  Vaginal bleeding since yesterday. Gestational age by last menstrual period 3 weeks and 6 days. Beta hCG 38. EXAM: OBSTETRIC <14 WK [place] AND TRANSVAGINAL OB US TECHNIQUE: Both transabdominal and transvaginal ultrasound examinations were performed for complete evaluation of the gestation as well as the maternal uterus, adnexal regions, and pelvic cul-de-sac. Transvaginal technique was performed to assess early pregnancy. COMPARISON:  None. FINDINGS: Intrauterine gestation not present.  Al sac: Present. Yolk sac:  Not present. Embryo:  Not present. Cardiac Activity: Not applicable. Subchorionic hemorrhage:  None visualized. Maternal uterus/adnexae: RIGHT ovary not sonographically identified. Intra-ovarian LEFT corpus luteal cysts measuring to 16 mm. IMPRESSION: 1. Pregnancy of unknown anatomic location (no intrauterine gestational sac or adnexal mass identified, however RIGHT ovary not sonographically identified). Differential diagnosis includes recent spontaneous abortion, IUP too early to visualize, and non-visualized ectopic pregnancy. Recommend correlation with  serial beta-hCG levels, and follow up US if warranted clinically. Please note, with beta HCG of less than 3,000, pregnancy may not be sonographically evident. Electronically Signed   By: Elon Alas M.D.   On: 02/18/2018 02:55    MAU Management/MDM: Orders Placed This Encounter  Procedures  . Wet prep, genital  . US OB LESS THAN 14 WEEKS WITH OB TRANSVAGINAL  . Urinalysis, Routine w reflex microscopic  . CBC  . hCG, quantitative, pregnancy  . Pregnancy, urine POC  . ABO/Rh  . Discharge patient Discharge disposition: 01-Home or Self Care; Discharge patient date: 02/18/2018   Wet prep- positive for clue cells otherwise negative  CBC- WNL  GC/C- pending  [place] and lab results reviewed with patient. Educated on ectopic pregnancy and precautions. Discussed importance of returning to MAU on Saturday morning for repeat hCG. Patient verbalizes understanding. She reports she will find someone to bring her.  Patient was able to be discharged with a taxi voucher. Pt stable at time of discharge.    ASSESSMENT 1. Pregnancy of unknown anatomic location   2. Abdominal pain during pregnancy in first trimester   3. Vaginal bleeding during pregnancy     PLAN Discharge home Return to MAU on Saturday at 11am for repeat hCG  Discussed reasons to return to MAU prior to lab work   [place]. Go on 02/20/2018.   Why:  Return to MAU on Saturday AM at [facility] for repeat lab work  Contact information: [address] [id number] [place] [phone]          Allergies as of 02/18/2018      Reactions   Codeine    "it makes me not be able to go to the bathroom on my own"      Medication List    STOP taking these medications   amoxicillin-clavulanate 875-125 MG tablet Commonly known as:  AUGMENTIN     TAKE these medications   aerochamber plus with mask inhaler Use as instructed    albuterol 108 (90 Base) MCG/ACT inhaler Commonly known as:  PROVENTIL HFA;VENTOLIN HFA Inhale 2 puffs into the lungs every 4 (four) hours as needed for wheezing or shortness of breath.       Darrol Poke  Certified Nurse-Midwife 02/18/2018  3:04 AM

## 2018-02-18 NOTE — MAU Note (Signed)
Pt reports pain in her abdomen and back that started yesterday. Pt describes the pain as cramping. Pt had some pink bleeding this evening. LMP 01/22/2018. Pt took a pregnancy test yesterday morning that had a faint line.

## 2018-04-11 ENCOUNTER — Emergency Department (HOSPITAL_COMMUNITY): Payer: Medicaid Other

## 2018-04-11 ENCOUNTER — Encounter (HOSPITAL_COMMUNITY): Payer: Self-pay | Admitting: Emergency Medicine

## 2018-04-11 ENCOUNTER — Emergency Department (HOSPITAL_COMMUNITY)
Admission: EM | Admit: 2018-04-11 | Discharge: 2018-04-12 | Disposition: A | Discharge status: Home / Self Care | Payer: Medicaid Other | Attending: Emergency Medicine | Admitting: Emergency Medicine

## 2018-04-11 DIAGNOSIS — N760 Acute vaginitis: Secondary | ICD-10-CM | POA: Insufficient documentation

## 2018-04-11 DIAGNOSIS — R1030 Lower abdominal pain, unspecified: Secondary | ICD-10-CM

## 2018-04-11 DIAGNOSIS — N390 Urinary tract infection, site not specified: Secondary | ICD-10-CM | POA: Diagnosis not present

## 2018-04-11 DIAGNOSIS — B9689 Other specified bacterial agents as the cause of diseases classified elsewhere: Secondary | ICD-10-CM | POA: Insufficient documentation

## 2018-04-11 DIAGNOSIS — R1032 Left lower quadrant pain: Secondary | ICD-10-CM | POA: Diagnosis not present

## 2018-04-11 DIAGNOSIS — R102 Pelvic and perineal pain: Secondary | ICD-10-CM

## 2018-04-11 DIAGNOSIS — F17210 Nicotine dependence, cigarettes, uncomplicated: Secondary | ICD-10-CM | POA: Diagnosis not present

## 2018-04-11 DIAGNOSIS — Z79899 Other long term (current) drug therapy: Secondary | ICD-10-CM | POA: Insufficient documentation

## 2018-04-11 LAB — COMPREHENSIVE METABOLIC PANEL
ALT: 15 U/L (ref 0–44)
ANION GAP: 3 — AB (ref 5–15)
AST: 17 U/L (ref 15–41)
Albumin: 4.1 g/dL (ref 3.5–5.0)
Alkaline Phosphatase: 85 U/L (ref 38–126)
BUN: 10 mg/dL (ref 6–20)
CHLORIDE: 108 mmol/L (ref 98–111)
CO2: 24 mmol/L (ref 22–32)
Calcium: 11.9 mg/dL — ABNORMAL HIGH (ref 8.9–10.3)
Creatinine, Ser: 0.81 mg/dL (ref 0.44–1.00)
GFR calc Af Amer: >60 mL/min (ref >60)
GFR calc non Af Amer: >60 mL/min (ref >60)
Glucose, Bld: 103 mg/dL — ABNORMAL HIGH (ref 70–99)
POTASSIUM: 3.5 mmol/L (ref 3.5–5.1)
Sodium: 135 mmol/L (ref 135–145)
Total Bilirubin: 0.7 mg/dL (ref 0.3–1.2)
Total Protein: 6.9 g/dL (ref 6.5–8.1)

## 2018-04-11 LAB — CBC
HEMATOCRIT: 35.6 % — AB (ref 36.0–46.0)
HEMOGLOBIN: 11.6 g/dL — AB (ref 12.0–15.0)
MCH: 31.3 pg (ref 26.0–34.0)
MCHC: 32.6 g/dL (ref 30.0–36.0)
MCV: 96 fL (ref 78.0–100.0)
Platelets: 267 10*3/uL (ref 150–400)
RBC: 3.71 MIL/uL — AB (ref 3.87–5.11)
RDW: 11.9 % (ref 11.5–15.5)
WBC: 6.3 10*3/uL (ref 4.0–10.5)

## 2018-04-11 LAB — WET PREP, GENITAL
SPERM: NONE SEEN
Trich, Wet Prep: NONE SEEN
Yeast Wet Prep HPF POC: NONE SEEN

## 2018-04-11 LAB — DIFFERENTIAL
ABS IMMATURE GRANULOCYTES: 0 10*3/uL (ref 0.0–0.1)
Basophils Absolute: 0 10*3/uL (ref 0.0–0.1)
Basophils Relative: 1 %
Eosinophils Absolute: 0.2 10*3/uL (ref 0.0–0.7)
Eosinophils Relative: 3 %
IMMATURE GRANULOCYTES: 0 %
Lymphocytes Relative: 47 %
Lymphs Abs: 2.9 10*3/uL (ref 0.7–4.0)
MONOS PCT: 8 %
Monocytes Absolute: 0.5 10*3/uL (ref 0.1–1.0)
NEUTROS ABS: 2.6 10*3/uL (ref 1.7–7.7)
Neutrophils Relative %: 41 %

## 2018-04-11 LAB — LIPASE, BLOOD: LIPASE: 27 U/L (ref 11–51)

## 2018-04-11 LAB — HCG, QUANTITATIVE, PREGNANCY: hCG, Beta Chain, Quant, S: <1 m[IU]/mL (ref <5)

## 2018-04-11 MED ORDER — METRONIDAZOLE 500 MG PO TABS: 500.0000 mg | ORAL_TABLET | Freq: Two times a day (BID) | ORAL | 0 refills | Status: DC

## 2018-04-11 MED ORDER — IBUPROFEN 400 MG PO TABS
600.0000 mg | ORAL_TABLET | Freq: Once | ORAL | Status: AC
  Administered 2018-04-11: 600 mg via ORAL
  Filled 2018-04-11: qty 1

## 2018-04-11 NOTE — ED Notes (Signed)
Pt is approx [redacted] weeks pregnant, EDD 10/29/2018.

## 2018-04-11 NOTE — Discharge Instructions (Addendum)
°  There is evidence of bacterial vaginosis on the wet prep today.  Please fill the prescription for Flagyl and take this medication, as directed, until finished.  There is also evidence for a urinary tract infection on your urinalysis.  Fill the prescription for Cipro you have been given today.  Follow-up with OB/GYN for any further management of pelvic pain or vaginal bleeding. Return to the ED for worsening pain, fever, persistent vomiting, or any other major concerns.

## 2018-04-11 NOTE — ED Provider Notes (Signed)
[facility] EMERGENCY DEPARTMENT Provider Note   CSN: [id number] Arrival date & time: 04/11/18  1950     History   Chief Complaint Chief Complaint  Patient presents with  . Abdominal Pain    HPI Theresa Mccoy is a 37 y.o. female.  HPI   Theresa Mccoy is a 37 y.o. female, with a history of hyperthyroidism and anemia, presenting to the ED with abdominal pain for the last 3 days.  Pain is across the lower abdomen, cramping, 8/10.  Was intermittent for the last 2 days, constant today.  Combined with vaginal bleeding.  Has changed the pad 3 times over the last 24 hours.  She cannot tell me when her last normal menstrual cycle was. She states, "This might be my period. I don't know. Maybe it's period cramps."  Patient presented to [facility] August 15 with abdominal cramping and vaginal bleeding.  She had a hCG of 38.  Ultrasound performed at that time was read as pregnancy of unknown anatomical location.  Patient was told to return in 2 days for repeat hCG, but patient failed to do so.  Denies fever, N/V/C/D, syncope, abdominal distention, abnormal vaginal discharge, trauma, chest pain, shortness of breath, urinary complaints, or any other complaints.      Past Medical History:  Diagnosis Date  . Anemia   . Depression   . Hyperthyroidism   . Thyroid disease     Patient Active Problem List   Diagnosis Date Noted  . Muscle cramps 02/02/2017  . Bipolar disorder, current episode mixed, moderate ([place]) 02/02/2017  . Generalized abdominal pain 02/02/2017  . Generalized anxiety disorder 02/02/2017  . Essential hypertension 02/02/2017    Past Surgical History:  Procedure Laterality Date  . CESAREAN SECTION    . HAND SURGERY Left   . SALPINGECTOMY       OB History    Gravida  5   Para  2   Term  2   Preterm      AB  2   Living  2     SAB  1   TAB      Ectopic  1   Multiple      Live Births               Home  Medications    Prior to Admission medications   Medication Sig Start Date End Date Taking? Authorizing Provider  Prenatal Vit-Fe Fumarate-FA (PRENATAL MULTIVITAMIN) TABS tablet Take 1 tablet by mouth daily at 12 noon.   Yes [provider]  albuterol (PROVENTIL HFA;VENTOLIN HFA) 108 (90 Base) MCG/ACT inhaler Inhale 2 puffs into the lungs every 4 (four) hours as needed for wheezing or shortness of breath. Patient not taking: Reported on 04/11/2018 08/07/17   Muthersbaugh, Jarrett Soho, PA-C  metroNIDAZOLE (FLAGYL) 500 MG tablet Take 1 tablet (500 mg total) by mouth 2 (two) times daily. 04/11/18   Joy, Helane Gunther, PA-C  Spacer/Aero-Holding Chambers (AEROCHAMBER PLUS WITH MASK) inhaler Use as instructed 08/07/17   Muthersbaugh, Jarrett Soho, PA-C    Family History No family history on file.  Social History Social History   Tobacco Use  . Smoking status: Current Every Day Smoker    Packs/day: 0.25    Types: Cigarettes  . Smokeless tobacco: Never Used  Substance Use Topics  . 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 Drug use: Yes    Types: Marijuana     Allergies   Codeine  Review of Systems Review of Systems  Constitutional: Negative for chills, diaphoresis and fever.  Respiratory: Negative for shortness of breath.   Cardiovascular: Negative for chest pain.  Gastrointestinal: Positive for abdominal pain. Negative for blood in stool, constipation, diarrhea, nausea and vomiting.  Genitourinary: Positive for vaginal bleeding. Negative for dysuria and vaginal discharge.  Musculoskeletal: Negative for back pain.  All other systems reviewed and are negative.    Physical Exam Updated Vital Signs BP 109/76 (BP Location: Right Arm)   Pulse 98   Temp 99.1 F (37.3 C) (Oral)   Resp 16   Ht 5' (1.524 m)   Wt 57.6 kg   LMP 01/22/2018 (Exact Date)   SpO2 100%   BMI 24.80 kg/m   Physical Exam  Constitutional: She appears well-developed and well-nourished. No distress.  HENT:    Head: Normocephalic and atraumatic.  Eyes: Conjunctivae are normal.  Neck: Neck supple.  Cardiovascular: Normal rate, regular rhythm, normal heart sounds and intact distal pulses.  Pulmonary/Chest: Effort normal and breath sounds normal. No respiratory distress.  Abdominal: Soft. There is tenderness. There is no guarding.    Abdominal exam is inconsistent.  Sometimes patient states she has pain, other times when the lower abdomen is palpated, she continues to talk with no reaction.  Genitourinary:  Genitourinary Comments: External genitalia normal Vagina with discharge - scant bright red blood Cervix  Normal - cervical os appears closed negative for cervical motion tenderness Adnexa palpated, no masses, positive for tenderness noted on the right "maybe it hurts." Tenderness in this region is questionable at most. Bladder palpated negative for tenderness Uterus palpated no masses, negative for tenderness  No inguinal lymphadenopathy. Otherwise normal female genitalia. RN, Crystal, served as Producer, television/film/video during exam.  Musculoskeletal: She exhibits no edema.  Lymphadenopathy:    She has no cervical adenopathy.  Neurological: She is alert.  Skin: Skin is warm and dry. She is not diaphoretic.  Psychiatric: She has a normal mood and affect. Her behavior is normal.  Nursing note and vitals reviewed.    ED Treatments / Results  Labs (all labs ordered are listed, but only abnormal results are displayed) Labs Reviewed  WET PREP, GENITAL - Abnormal; Notable for the following components:      Result Value   Clue Cells Wet Prep HPF POC PRESENT (*)    WBC, Wet Prep HPF POC FEW (*)    All other components within normal limits  COMPREHENSIVE METABOLIC PANEL - Abnormal; Notable for the following components:   Glucose, Bld 103 (*)    Calcium 11.9 (*)    Anion gap 3 (*)    All other components within normal limits  CBC - Abnormal; Notable for the following components:   RBC 3.71 (*)     Hemoglobin 11.6 (*)    HCT 35.6 (*)    All other components within normal limits  LIPASE, BLOOD  HCG, QUANTITATIVE, PREGNANCY  DIFFERENTIAL  URINALYSIS, ROUTINE W REFLEX MICROSCOPIC  GC/CHLAMYDIA PROBE AMP ([place]) NOT AT [facility]    EKG None  Radiology No results found.  Procedures Pelvic exam Date/Time: 04/11/2018 9:32 PM Performed by: Lorayne Bender, PA-C Authorized by: Lorayne Bender, PA-C  Consent: Verbal consent obtained. Risks and benefits: risks, benefits and alternatives were discussed Consent given by: patient Patient identity confirmed: verbally with patient Local anesthesia used: no  Anesthesia: Local anesthesia used: no  Sedation: Patient sedated: no  Patient tolerance: Patient tolerated the procedure well with no immediate complications    (  including critical care time)  Medications Ordered in ED Medications  ibuprofen (ADVIL,MOTRIN) tablet 600 mg (600 mg Oral Given 04/11/18 2230)     Initial Impression / Assessment and Plan / ED Course  I have reviewed the triage vital signs and the nursing notes.  Pertinent labs & imaging results that were available during my care of the patient were reviewed by me and considered in my medical decision making (see chart for details).       Patient is nontoxic appearing, afebrile, not tachycardic, not tachypneic, not hypotensive, maintains excellent SPO2 on room air, and is in no apparent distress.  My suspicion for surgical abdomen, including appendicitis, is quite low.  Serial exams are inconsistent with one another, she has no leukocytosis, she turns over in the bed without noted hesitation or difficulty.  End of shift patient care handoff report given to Trish Mage, MD. Plan: Pelvic ultrasound pending.  Review results, repeat abdominal exam, and if benign, discharge.   Final Clinical Impressions(s) / ED Diagnoses   Final diagnoses:  Lower abdominal pain  BV (bacterial vaginosis)    ED Discharge Orders           Ordered    metroNIDAZOLE (FLAGYL) 500 MG tablet  2 times daily     04/11/18 2229           Layla Maw 04/11/18 2242    Valarie Merino, MD 04/14/18 [phone]

## 2018-04-11 NOTE — ED Triage Notes (Signed)
Pt reports lower abd pain X3 days. Cramping in nature, 8/10. Denies N/V/D.

## 2018-04-12 LAB — URINALYSIS, ROUTINE W REFLEX MICROSCOPIC
BILIRUBIN URINE: NEGATIVE
Glucose, UA: NEGATIVE mg/dL
Ketones, ur: NEGATIVE mg/dL
NITRITE: POSITIVE — AB
Protein, ur: 100 mg/dL — AB
RBC / HPF: >50 RBC/hpf — ABNORMAL HIGH (ref 0–5)
Specific Gravity, Urine: 1.016 (ref 1.005–1.030)
pH: 6 (ref 5.0–8.0)

## 2018-04-12 LAB — GC/CHLAMYDIA PROBE AMP (~~LOC~~) NOT AT ARMC
CHLAMYDIA, DNA PROBE: NEGATIVE
Neisseria Gonorrhea: NEGATIVE

## 2018-04-12 MED ORDER — CIPROFLOXACIN HCL 500 MG PO TABS: 500.0000 mg | ORAL_TABLET | Freq: Two times a day (BID) | ORAL | 0 refills | Status: DC

## 2018-04-12 MED ORDER — METRONIDAZOLE 500 MG PO TABS: 500.0000 mg | ORAL_TABLET | Freq: Two times a day (BID) | ORAL | 0 refills | Status: DC

## 2018-08-04 DIAGNOSIS — R7303 Prediabetes: Secondary | ICD-10-CM | POA: Diagnosis not present

## 2018-10-06 ENCOUNTER — Emergency Department (HOSPITAL_COMMUNITY)
Admission: EM | Admit: 2018-10-06 | Discharge: 2018-10-06 | Disposition: A | Discharge status: Home / Self Care | Payer: Medicaid Other | Attending: Emergency Medicine | Admitting: Emergency Medicine

## 2018-10-06 ENCOUNTER — Inpatient Hospital Stay (HOSPITAL_COMMUNITY)
Admission: AD | Admit: 2018-10-06 | Discharge: 2018-10-11 | DRG: 885 | Disposition: A | Discharge status: Home / Self Care | Payer: Medicaid Other | Attending: Psychiatry | Admitting: Psychiatry

## 2018-10-06 ENCOUNTER — Encounter (HOSPITAL_COMMUNITY): Payer: Self-pay

## 2018-10-06 ENCOUNTER — Other Ambulatory Visit: Payer: Self-pay | Admitting: Registered Nurse

## 2018-10-06 ENCOUNTER — Other Ambulatory Visit: Payer: Self-pay

## 2018-10-06 ENCOUNTER — Encounter (HOSPITAL_COMMUNITY): Payer: Self-pay | Admitting: *Deleted

## 2018-10-06 DIAGNOSIS — E0590 Thyrotoxicosis, unspecified without thyrotoxic crisis or storm: Secondary | ICD-10-CM | POA: Diagnosis present

## 2018-10-06 DIAGNOSIS — Z79899 Other long term (current) drug therapy: Secondary | ICD-10-CM | POA: Diagnosis not present

## 2018-10-06 DIAGNOSIS — Z62810 Personal history of physical and sexual abuse in childhood: Secondary | ICD-10-CM | POA: Diagnosis present

## 2018-10-06 DIAGNOSIS — F22 Delusional disorders: Secondary | ICD-10-CM

## 2018-10-06 DIAGNOSIS — Z915 Personal history of self-harm: Secondary | ICD-10-CM | POA: Diagnosis not present

## 2018-10-06 DIAGNOSIS — M797 Fibromyalgia: Secondary | ICD-10-CM | POA: Diagnosis present

## 2018-10-06 DIAGNOSIS — Z3202 Encounter for pregnancy test, result negative: Secondary | ICD-10-CM | POA: Diagnosis present

## 2018-10-06 DIAGNOSIS — F333 Major depressive disorder, recurrent, severe with psychotic symptoms: Principal | ICD-10-CM | POA: Diagnosis present

## 2018-10-06 DIAGNOSIS — Z62811 Personal history of psychological abuse in childhood: Secondary | ICD-10-CM | POA: Diagnosis present

## 2018-10-06 DIAGNOSIS — R45851 Suicidal ideations: Secondary | ICD-10-CM | POA: Diagnosis present

## 2018-10-06 DIAGNOSIS — I10 Essential (primary) hypertension: Secondary | ICD-10-CM | POA: Insufficient documentation

## 2018-10-06 DIAGNOSIS — F17210 Nicotine dependence, cigarettes, uncomplicated: Secondary | ICD-10-CM | POA: Diagnosis present

## 2018-10-06 DIAGNOSIS — F4310 Post-traumatic stress disorder, unspecified: Secondary | ICD-10-CM | POA: Diagnosis present

## 2018-10-06 DIAGNOSIS — F332 Major depressive disorder, recurrent severe without psychotic features: Secondary | ICD-10-CM | POA: Insufficient documentation

## 2018-10-06 LAB — CBC WITH DIFFERENTIAL/PLATELET
Abs Immature Granulocytes: 0.03 10*3/uL (ref 0.00–0.07)
Basophils Absolute: 0 10*3/uL (ref 0.0–0.1)
Basophils Relative: 0 %
Eosinophils Absolute: 0 10*3/uL (ref 0.0–0.5)
Eosinophils Relative: 0 %
HCT: 40.1 % (ref 36.0–46.0)
Hemoglobin: 13.5 g/dL (ref 12.0–15.0)
Immature Granulocytes: 0 %
Lymphocytes Relative: 15 %
Lymphs Abs: 1.5 10*3/uL (ref 0.7–4.0)
MCH: 32.1 pg (ref 26.0–34.0)
MCHC: 33.7 g/dL (ref 30.0–36.0)
MCV: 95.5 fL (ref 80.0–100.0)
Monocytes Absolute: 0.4 10*3/uL (ref 0.1–1.0)
Monocytes Relative: 4 %
Neutro Abs: 7.7 10*3/uL (ref 1.7–7.7)
Neutrophils Relative %: 81 %
Platelets: 231 10*3/uL (ref 150–400)
RBC: 4.2 MIL/uL (ref 3.87–5.11)
RDW: 11.9 % (ref 11.5–15.5)
WBC: 9.7 10*3/uL (ref 4.0–10.5)
nRBC: 0 % (ref 0.0–0.2)

## 2018-10-06 LAB — URINALYSIS, ROUTINE W REFLEX MICROSCOPIC
Bilirubin Urine: NEGATIVE
Glucose, UA: NEGATIVE mg/dL
Ketones, ur: NEGATIVE mg/dL
Leukocytes,Ua: NEGATIVE
Nitrite: NEGATIVE
Protein, ur: NEGATIVE mg/dL
Specific Gravity, Urine: 1.009 (ref 1.005–1.030)
pH: 7 (ref 5.0–8.0)

## 2018-10-06 LAB — COMPREHENSIVE METABOLIC PANEL
ALT: 19 U/L (ref 0–44)
AST: 21 U/L (ref 15–41)
Albumin: 3.8 g/dL (ref 3.5–5.0)
Alkaline Phosphatase: 81 U/L (ref 38–126)
Anion gap: 6 (ref 5–15)
BUN: 9 mg/dL (ref 6–20)
CO2: 22 mmol/L (ref 22–32)
Calcium: 11.8 mg/dL — ABNORMAL HIGH (ref 8.9–10.3)
Chloride: 110 mmol/L (ref 98–111)
Creatinine, Ser: 0.82 mg/dL (ref 0.44–1.00)
GFR calc Af Amer: >60 mL/min (ref >60)
GFR calc non Af Amer: >60 mL/min (ref >60)
Glucose, Bld: 119 mg/dL — ABNORMAL HIGH (ref 70–99)
Potassium: 3.7 mmol/L (ref 3.5–5.1)
Sodium: 138 mmol/L (ref 135–145)
Total Bilirubin: 0.7 mg/dL (ref 0.3–1.2)
Total Protein: 6.5 g/dL (ref 6.5–8.1)

## 2018-10-06 LAB — RAPID URINE DRUG SCREEN, HOSP PERFORMED
Amphetamines: NOT DETECTED
Barbiturates: NOT DETECTED
Benzodiazepines: NOT DETECTED
Cocaine: NOT DETECTED
Opiates: NOT DETECTED
Tetrahydrocannabinol: POSITIVE — AB

## 2018-10-06 LAB — LIPASE, BLOOD: Lipase: 31 U/L (ref 11–51)

## 2018-10-06 LAB — I-STAT BETA HCG BLOOD, ED (MC, WL, AP ONLY): I-stat hCG, quantitative: <5 m[IU]/mL (ref <5)

## 2018-10-06 MED ORDER — SODIUM CHLORIDE 0.9 % IV BOLUS
1000.0000 mL | Freq: Once | INTRAVENOUS | Status: AC
  Administered 2018-10-06: 1000 mL via INTRAVENOUS

## 2018-10-06 MED ORDER — ZIPRASIDONE MESYLATE 20 MG IM SOLR
10.0000 mg | INTRAMUSCULAR | Status: DC | PRN
  Administered 2018-10-06: 10 mg via INTRAMUSCULAR
  Filled 2018-10-06: qty 20

## 2018-10-06 MED ORDER — OLANZAPINE 5 MG PO TBDP
5.0000 mg | ORAL_TABLET | Freq: Three times a day (TID) | ORAL | Status: DC | PRN
  Administered 2018-10-08: 5 mg via ORAL
  Filled 2018-10-06: qty 1

## 2018-10-06 MED ORDER — LORAZEPAM 1 MG PO TABS
1.0000 mg | ORAL_TABLET | ORAL | Status: AC | PRN
  Administered 2018-10-09: 10:00:00 1 mg via ORAL
  Filled 2018-10-06: qty 1

## 2018-10-06 MED ORDER — STERILE WATER FOR INJECTION IJ SOLN
INTRAMUSCULAR | Status: AC
  Administered 2018-10-06: 11:00:00
  Filled 2018-10-06: qty 10

## 2018-10-06 MED ORDER — ZIPRASIDONE MESYLATE 20 MG IM SOLR: 10.0000 mg | INTRAMUSCULAR | Status: DC | PRN

## 2018-10-06 NOTE — ED Notes (Signed)
Pt kept saying to tech "I just don't know when ill go off. When I go off I go off. I cant control it." Pt also kept saying she feels very weak. Pt stated, "usually I can make a fist and I cant even do that, i'm just weak."

## 2018-10-06 NOTE — ED Provider Notes (Signed)
Patient has been accepted to behavioral health for admission.   Theresa Rasmussen, MD 10/06/18 1228

## 2018-10-06 NOTE — Progress Notes (Signed)
Pt accepted to [facility], Bed 508-1  Shuvon Rankin, NP is the accepting provider.  Johnn Hai, MD is the attending provider.  Call report to [facility]  ED notified.   Pt is IVC .  Pt may be transported by [employer] Pt scheduled  to arrive at [place] 14:00  Areatha Keas. Judi Cong, MSW, [place] Disposition Clinical Social Work [phone] (cell) [phone] (office)
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
## 2018-10-06 NOTE — ED Triage Notes (Signed)
Pt arrived via [facility]; pt with c/o abd pain, HA, n/v/d x 1 hr; pt states that she is 36 wks preg; pt has anxiety, PTSD, thyroid issues, balance issues; 114/70;84; 16; 98.6

## 2018-10-06 NOTE — Progress Notes (Signed)
Theresa Mccoy is a 38 year old female pt admitted on involuntary basis. On admission, she is very adamant about how she does not need to be here and spoke about how she is gonna kill the bitch that put me here. She spoke about how she was having abdominal pain and back pain and was feeling weak and went to the hospital because of this. She also reports that she is pregnant and gets irritated when told that she's not. She spoke about how she has been pregnant seven times and knows what it's like to be pregnant and she reports that she feels that way currently. She reports that she is not on any medications currently. She does endorse marijuana usage but denies any other substance use. She reports that she lives with her boyfriend and reports that she will go back once she is discharged. Qunisha was able to complete admission without incident, she was escorted to the unit, oriented to the milieu and safety maintained.

## 2018-10-06 NOTE — ED Notes (Signed)
Resting comfortably, watching TV

## 2018-10-06 NOTE — ED Notes (Signed)
Sleeping at present,

## 2018-10-06 NOTE — ED Notes (Signed)
Pt changed into maroon scrubs. Belongings placed in bags. Security wanded pt.

## 2018-10-06 NOTE — Tx Team (Signed)
Initial Treatment Plan 10/06/2018 5:26 PM Theresa Mccoy [id number]    PATIENT STRESSORS: Health problems   PATIENT STRENGTHS: Ability for insight Average or above average intelligence Capable of independent living   PATIENT IDENTIFIED PROBLEMS: Delusional "I'm gonna kill the bitch that put me here" "I'm pregnant, I know I'm pregnant and they keep telling me I'm not"                     DISCHARGE CRITERIA:  Ability to meet basic life and health needs Improved stabilization in mood, thinking, and/or behavior Verbal commitment to aftercare and medication compliance  PRELIMINARY DISCHARGE PLAN: Attend aftercare/continuing care group Return to previous living arrangement  PATIENT/FAMILY INVOLVEMENT: This treatment plan has been presented to and reviewed with the patient, [name], and/or family member, .  The patient and family have been given the opportunity to ask questions and make suggestions.  Takeyah Wieman, [place], [place] 10/06/2018, 5:26 PM

## 2018-10-06 NOTE — ED Provider Notes (Signed)
[facility] EMERGENCY DEPARTMENT Provider Note   CSN: [id number] Arrival date & time: 10/06/18  0354    History   Chief Complaint Chief Complaint  Patient presents with  . Abdominal Pain    N/V/D x 1 hr;  36wks preg    HPI Barrett Holthaus is a 38 y.o. female.     Patient presents to the emergency department with chief complaint of Mccoy, Theresa Mccoy, Theresa Mccoy, and diarrhea that started approximately 1 hour ago.  Patient reports that she is [redacted] weeks pregnant.  She states that she has not seen by anyone, because no one believes that she is pregnant.  She denies any fever, chills, chest pain, shortness of breath, or cough.  Denies any known sick contacts.  Denies any medications prior to arrival.  The history is provided by the patient. No language interpreter was used.    Past Medical History:  Diagnosis Date  . Anemia   . Depression   . Hyperthyroidism   . Thyroid disease     Patient Active Problem List   Diagnosis Date Noted  . Muscle cramps 02/02/2017  . Bipolar disorder, current episode mixed, moderate ([place]) 02/02/2017  . Generalized abdominal pain 02/02/2017  . Generalized anxiety disorder 02/02/2017  . Essential hypertension 02/02/2017    Past Surgical History:  Procedure Laterality Date  . CESAREAN SECTION    . HAND SURGERY Left   . SALPINGECTOMY       OB History    Gravida  5   Para  2   Term  2   Preterm      AB  2   Living  2     SAB  1   TAB      Ectopic  1   Multiple      Live Births               Home Medications    Prior to Admission medications   Medication Sig Start Date End Date Taking? Authorizing Provider  albuterol (PROVENTIL HFA;VENTOLIN HFA) 108 (90 Base) MCG/ACT inhaler Inhale 2 puffs into the lungs every 4 (four) hours as needed for wheezing or shortness of breath. Patient not taking: Reported on 04/11/2018 08/07/17   Muthersbaugh, Jarrett Soho, PA-C  ciprofloxacin (CIPRO) 500 MG tablet Take 1 tablet (500  mg total) by mouth 2 (two) times daily. One po bid x 7 days 04/12/18   Veryl Speak, MD  metroNIDAZOLE (FLAGYL) 500 MG tablet Take 1 tablet (500 mg total) by mouth 2 (two) times daily. 04/12/18   Veryl Speak, MD  Prenatal Vit-Fe Fumarate-FA (PRENATAL MULTIVITAMIN) TABS tablet Take 1 tablet by mouth daily.     [provider]  Spacer/Aero-Holding Chambers (AEROCHAMBER PLUS WITH MASK) inhaler Use as instructed Patient not taking: Reported on 04/11/2018 08/07/17   Muthersbaugh, Jarrett Soho, PA-C    Family History History reviewed. No pertinent family history.  Social History Social History   Tobacco Use  . Smoking status: Current Every Day Smoker    Packs/day: 0.25    Types: Cigarettes  . Smokeless tobacco: Never Used  Substance Use Topics  . 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 Drug use: Yes    Types: Marijuana     Allergies   Codeine   Review of Systems Review of Systems  All other systems reviewed and are negative.    Physical Exam Updated Vital Signs BP (!) 125/93 (BP Location: Right Arm)   Pulse 72  Temp 97.8 F (36.6 C) (Oral)   Resp 18   Ht 5' (1.524 m)   Wt 68.9 kg   LMP 01/22/2018 (Exact Date)   SpO2 100%   Breastfeeding Unknown   BMI 29.69 kg/m   Physical Exam Vitals signs and nursing note reviewed.  Constitutional:      Appearance: She is well-developed.  HENT:     Head: Normocephalic and atraumatic.  Eyes:     Conjunctiva/sclera: Conjunctivae normal.     Pupils: Pupils are equal, round, and reactive to light.  Neck:     Musculoskeletal: Normal range of motion and neck supple.  Cardiovascular:     Rate and Rhythm: Normal rate and regular rhythm.     Heart sounds: No murmur. No friction rub. No gallop.   Pulmonary:     Effort: Pulmonary effort is normal. No respiratory distress.     Breath sounds: Normal breath sounds. No wheezing or rales.  Chest:     Chest wall: No tenderness.  Abdominal:     General: Bowel sounds are  normal. There is no distension.     Palpations: Abdomen is soft. There is no mass.     Tenderness: There is no abdominal tenderness. There is no guarding or rebound.     Comments: Abdomen is soft and nontender  Musculoskeletal: Normal range of motion.        General: No tenderness.  Skin:    General: Skin is warm and dry.  Neurological:     Mental Status: She is alert and oriented to person, place, and time.  Psychiatric:        Behavior: Behavior normal.        Thought Content: Thought content normal.        Judgment: Judgment normal.      ED Treatments / Results  Labs (all labs ordered are listed, but only abnormal results are displayed) Labs Reviewed  URINE CULTURE  CBC WITH DIFFERENTIAL/PLATELET  COMPREHENSIVE METABOLIC PANEL  URINALYSIS, ROUTINE W REFLEX MICROSCOPIC  LIPASE, BLOOD  RAPID URINE DRUG SCREEN, HOSP PERFORMED  I-STAT BETA HCG BLOOD, ED ([facility], [facility], [facility] ONLY)    EKG None  Radiology No results found.  Procedures Procedures (including critical care time)  Medications Ordered in ED Medications  sodium chloride 0.9 % bolus 1,000 mL (has no administration in time range)     Initial Impression / Assessment and Plan / ED Course  I have reviewed the triage vital signs and the nursing notes.  Pertinent labs & imaging results that were available during my care of the patient were reviewed by me and considered in my medical decision making (see chart for details).        Patient with complaints of Theresa Mccoy, Theresa Mccoy, diarrhea and Mccoy x1 hour.  She reports that she is 36 weeks.  It is noted that the patient had a positive hCG in August 2019, but there was no confirmed IUP.  She was advised to follow-up with [facility] for repeat hCG 2 days later, but never did so.  Today, abdomen is nongravid, nontender.  Will check labs and reassess.  HCG negative.    Patient persists in her delusions that she is pregnant.    Patient signed out to [facility], [place],  who will continue care.   Final Clinical Impressions(s) / ED Diagnoses   Final diagnoses:  None    ED Discharge Orders    None       Montine Circle, PA-C 10/06/18 6283  Ezequiel Essex, MD 10/06/18 [phone]

## 2018-10-06 NOTE — ED Notes (Signed)
Pt refused.

## 2018-10-07 DIAGNOSIS — F333 Major depressive disorder, recurrent, severe with psychotic symptoms: Principal | ICD-10-CM

## 2018-10-07 LAB — URINE CULTURE

## 2018-10-07 MED ORDER — HYDROXYZINE HCL 50 MG PO TABS
50.0000 mg | ORAL_TABLET | Freq: Once | ORAL | Status: AC
  Administered 2018-10-07: 50 mg via ORAL
  Filled 2018-10-07 (×2): qty 1

## 2018-10-07 MED ORDER — RISPERIDONE 2 MG PO TABS
2.0000 mg | ORAL_TABLET | Freq: Two times a day (BID) | ORAL | Status: DC
  Administered 2018-10-07 – 2018-10-08 (×2): 2 mg via ORAL
  Filled 2018-10-07 (×4): qty 1

## 2018-10-07 MED ORDER — TEMAZEPAM 15 MG PO CAPS
30.0000 mg | ORAL_CAPSULE | Freq: Every day | ORAL | Status: DC
  Administered 2018-10-08 – 2018-10-10 (×3): 30 mg via ORAL
  Filled 2018-10-07 (×3): qty 2

## 2018-10-07 MED ORDER — GABAPENTIN 300 MG PO CAPS
300.0000 mg | ORAL_CAPSULE | Freq: Three times a day (TID) | ORAL | Status: DC
  Administered 2018-10-07 – 2018-10-11 (×12): 300 mg via ORAL
  Filled 2018-10-07 (×17): qty 1

## 2018-10-07 MED ORDER — BENZTROPINE MESYLATE 0.5 MG PO TABS
0.5000 mg | ORAL_TABLET | Freq: Two times a day (BID) | ORAL | Status: DC
  Administered 2018-10-07 – 2018-10-11 (×8): 0.5 mg via ORAL
  Filled 2018-10-07 (×12): qty 1
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
## 2018-10-07 NOTE — BHH Group Notes (Signed)
[place] LCSW Group Therapy Note  Date/Time: 10/07/18, 1315  Type of Therapy/Topic:  Group Therapy:  Balance in Life  Participation Level:  Did not attend  Description of Group:    This group will address the concept of balance and how it feels and looks when one is unbalanced. Patients will be encouraged to process areas in their lives that are out of balance, and identify reasons for remaining unbalanced. Facilitators will guide patients utilizing problem- solving interventions to address and correct the stressor making their life unbalanced. Understanding and applying boundaries will be explored and addressed for obtaining  and maintaining a balanced life. Patients will be encouraged to explore ways to assertively make their unbalanced needs known to significant others in their lives, using other group members and facilitator for support and feedback.  Therapeutic Goals: 1. Patient will identify two or more emotions or situations they have that consume much of in their lives. 2. Patient will identify signs/triggers that life has become out of balance:  3. Patient will identify two ways to set boundaries in order to achieve balance in their lives:  4. Patient will demonstrate ability to communicate their needs through discussion and/or role plays  Summary of Patient Progress:          Therapeutic Modalities:   Cognitive Behavioral Therapy Solution-Focused Therapy Assertiveness Training  Lurline Idol, LCSW

## 2018-10-07 NOTE — BHH Suicide Risk Assessment (Signed)
[facility] Admission Suicide Risk Assessment   Nursing information obtained from:  Patient Demographic factors:  Low socioeconomic status Current Mental Status:  NA Loss Factors:  Financial problems / change in socioeconomic status Historical Factors:  Family history of mental illness or substance abuse Risk Reduction Factors:  Living with another person, especially a relative, Positive coping skills or problem solving skills  Total Time spent with patient: 45 minutes Principal Problem: Delusions of pregnancy Diagnosis:  Active Problems:   MDD (major depressive disorder), recurrent, severe, with psychosis ([place])  Subjective Data: Patient focused on her delusional believes believes she is going into labor this morning not reassured by negative test  Continued Clinical Symptoms:  Alcohol Use Disorder Identification Test Final Score (AUDIT): 0 The "Alcohol Use Disorders Identification Test", Guidelines for Use in Primary Care, Second Edition.  World Pharmacologist [facility]). Score between 0-7:  no or low risk or alcohol related problems. Score between 8-15:  moderate risk of alcohol related problems. Score between 16-19:  high risk of alcohol related problems. Score 20 or above:  warrants further diagnostic evaluation for alcohol dependence and treatment.   CLINICAL FACTORS:   Schizophrenia:   Paranoid or undifferentiated type   COGNITIVE FEATURES THAT CONTRIBUTE TO RISK:  Loss of executive function    SUICIDE RISK:   Minimal: No identifiable suicidal ideation.  Patients presenting with no risk factors but with morbid ruminations; may be classified as minimal risk based on the severity of the depressive symptoms  PLAN OF CARE: Admit for stabilization  I certify that inpatient services furnished can reasonably be expected to improve the patient's condition.   Johnn Hai, MD 10/07/2018, 12:57 PM

## 2018-10-07 NOTE — Progress Notes (Signed)
Recreation Therapy Notes  INPATIENT RECREATION THERAPY ASSESSMENT  Patient Details Name: Theresa Mccoy MRN: [id number] DOB: 07/27/1980 Today's Date: 10/07/2018       Information Obtained From: Patient  Able to Participate in Assessment/Interview: Yes  Patient Presentation: Alert  Reason for Admission (Per Patient): Other (Comments)(Pt stated she came to the hospital for a headache and abdominal pains.)  Patient Stressors: Other (Comment)(Not working)  Radiographer, therapeutic:   Building control surveyor, Write, TV, Aggression, Music, Deep Breathing, Substance Abuse, Impulsivity, Talk, Avoidance  Leisure Interests (2+):  Individual - Other (Comment), Individual - Phone, Music - Listen(Watch cartoons)  Frequency of Recreation/Participation: Other (Comment)(Daily)  Awareness of Community Resources:  Yes  Community Resources:  Engineer, drilling, [employer]  Current Use: No  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, 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

## 2018-10-07 NOTE — Progress Notes (Signed)
Recreation Therapy Notes  Date: 4.2.20 Time:  1000 Location: 500 Hall Dayroom  Group Topic: Communication, Team Building, Problem Solving  Goal Area(s) Addresses:  Patient will effectively work with peer towards shared goal.  Patient will identify skills used to make activity successful.  Patient will identify how skills used during activity can be used to reach post d/c goals.   Intervention: STEM Activity  Activity: Geophysicist/field seismologist. In teams patients were given 12 plastic drinking straws and a length of masking tape. Using the materials provided patients were asked to build a landing pad to catch a golf ball dropped from approximately 6 feet in the air.   Education: Education officer, community, Discharge Planning   Education Outcome: Acknowledges education/In group clarification offered/Needs additional education.   Clinical Observations/Feedback:  Pt did not attend group.    Victorino Sparrow, LRT/CTRS         Victorino Sparrow A 10/07/2018 11:26 AM

## 2018-10-07 NOTE — Progress Notes (Signed)
D:  Alishah was in her room most of the evening asleep in her bed.  She did not get up for evening wrap up group.  She did get up later because she stated she was hearing loud "footsteps and someone slamming the doors loudly upstairs."  Reassured her that there is no upstairs, staff had not been hearing those things and that we are regularly doing rounds for safety.  She was asked if those sounds were in her head and she stated "I don't know, it's crazy."  She denied visual hallucinations.  She denied SI/HI.  She continued to be focused on getting prenatal vitamins ordered and being pregnant.  She declined needing anything for sleep stating "I don't like to take things for sleep."  She was provided snack and she promptly returned to her room.  She is currently resting with her eyes closed and appears to be asleep. A:  1:1 with RN for support and encouragement.  Medications as ordered.  Q 15 minute checks maintained for safety.  Encouraged participation in group and unit activities.   R:  Betzy remains safe on the unit.  We will continue to monitor the progress towards her goals.

## 2018-10-07 NOTE — Progress Notes (Signed)
D:  Theresa Mccoy was in bed asleep at the beginning of the shift. She did get up early in the morning.  She was paranoid and irritable.  She was upset that she wasn't able to call her fiance at 2am.  She denied SI/HI or A/V hallucinations.  She did complain of itching and Vistaril one time order obtained.  She declined any other medication at that time.  She is currently resting with her eyes closed and appears to be asleep. A:  1:1 with RN for support and encouragement.  Medications as ordered.  Q 15 minute checks maintained for safety.  Encouraged participation in group and unit activities.   R:  Senetra remains safe on the unit.  We will continue to monitor the progress towards her goals.

## 2018-10-07 NOTE — BHH Group Notes (Signed)
[place] Group Notes:  (Nursing/MHT/Case Management/Adjunct)  Date:  10/07/2018  Time:  4:00 PM  Type of Therapy:  Nurse Education  Participation Level:  Active  Participation Quality:  Appropriate and Attentive  Affect:  Appropriate  Cognitive:  Alert and Appropriate  Insight:  Appropriate  Engagement in Group:  Engaged and Improving  Modes of Intervention:  Discussion, Education and Exploration  Summary of Progress/Problems:  pt's discussed coping mechanisms and ways to utilize these skills when they leave.  Otelia Limes Baylynn Shifflett 10/07/2018, 5:32 PM

## 2018-10-07 NOTE — Progress Notes (Signed)
Psychoeducational Group Note  Date:  10/07/2018 Time:  2102  Group Topic/Focus:  Wrap-Up Group:   The focus of this group is to help patients review their daily goal of treatment and discuss progress on daily workbooks.  Participation Level: Did Not Attend  Participation Quality:  Not Applicable  Affect:  Not Applicable  Cognitive:  Not Applicable  Insight:  Not Applicable  Engagement in Group: Not Applicable  Additional Comments:  Patient did not attend group this evening due to being asleep.   Remi Lopata S 10/07/2018, 9:02 PM

## 2018-10-08 MED ORDER — RISPERIDONE 2 MG PO TABS
2.0000 mg | ORAL_TABLET | Freq: Every day | ORAL | Status: DC
  Administered 2018-10-08 – 2018-10-11 (×4): 2 mg via ORAL
  Filled 2018-10-08 (×6): qty 1

## 2018-10-08 MED ORDER — RISPERIDONE 2 MG PO TABS
4.0000 mg | ORAL_TABLET | Freq: Every day | ORAL | Status: DC
  Administered 2018-10-09 – 2018-10-10 (×2): 4 mg via ORAL
  Filled 2018-10-08 (×4): qty 2

## 2018-10-08 NOTE — Tx Team (Signed)
Interdisciplinary Treatment and Diagnostic Plan Update  10/08/2018 Time of Session: 8[address] Pecola Haxton MRN: [id number]  Principal Diagnosis: <principal problem not specified>  Secondary Diagnoses: Active Problems:   MDD (major depressive disorder), recurrent, severe, with psychosis ([place])   Current Medications:  Current Facility-Administered Medications  Medication Dose Route Frequency Provider Last Rate Last Dose  . benztropine (COGENTIN) tablet 0.5 mg  0.5 mg Oral BID Johnn Hai, MD   0.5 mg at 10/08/18 0914  . gabapentin (NEURONTIN) capsule 300 mg  300 mg Oral TID Johnn Hai, MD   300 mg at 10/08/18 1156  . OLANZapine zydis (ZYPREXA) disintegrating tablet 5 mg  5 mg Oral Q8H PRN Cobos, Myer Peer, MD   5 mg at 10/08/18 0915   And  . LORazepam (ATIVAN) tablet 1 mg  1 mg Oral PRN Cobos, Myer Peer, MD       And  . ziprasidone (GEODON) injection 10 mg  10 mg Intramuscular PRN Cobos, Myer Peer, MD      . risperiDONE (RISPERDAL) tablet 2 mg  2 mg Oral Daily Johnn Hai, MD   2 mg at 10/08/18 1155  . [START ON 10/09/2018] risperiDONE (RISPERDAL) tablet 4 mg  4 mg Oral QHS Johnn Hai, MD      . temazepam (RESTORIL) capsule 30 mg  30 mg Oral QHS Johnn Hai, MD       PTA Medications: Medications Prior to Admission  Medication Sig Dispense Refill Last Dose  . albuterol (PROVENTIL HFA;VENTOLIN HFA) 108 (90 Base) MCG/ACT inhaler Inhale 2 puffs into the lungs every 4 (four) hours as needed for wheezing or shortness of breath. (Patient not taking: Reported on 04/11/2018) 1 Inhaler 3 Not Taking at Unknown time  . ciprofloxacin (CIPRO) 500 MG tablet Take 1 tablet (500 mg total) by mouth 2 (two) times daily. One po bid x 7 days (Patient not taking: Reported on 10/06/2018) 14 tablet 0 Not Taking at Unknown time  . metroNIDAZOLE (FLAGYL) 500 MG tablet Take 1 tablet (500 mg total) by mouth 2 (two) times daily. (Patient not taking: Reported on 10/06/2018) 14 tablet 0 Not Taking at Unknown time  .  Prenatal Vit-Fe Fumarate-FA (PRENATAL MULTIVITAMIN) TABS tablet Take 1 tablet by mouth daily.    10/05/2018 at Unknown time  . Spacer/Aero-Holding Chambers (AEROCHAMBER PLUS WITH MASK) inhaler Use as instructed (Patient not taking: Reported on 04/11/2018) 1 each 2 Not Taking at Unknown time    Patient Stressors: Health problems  Patient Strengths: Ability for insight Average or above average intelligence Capable of independent living  Treatment Modalities: Medication Management, Group therapy, Case management,  1 to 1 session with clinician, Psychoeducation, Recreational therapy.   Physician Treatment Plan for Primary Diagnosis: <principal problem not specified> Long Term Goal(s): Improvement in symptoms so as ready for discharge Improvement in symptoms so as ready for discharge   Short Term Goals: Ability to identify changes in lifestyle to reduce recurrence of condition will improve Ability to identify triggers associated with substance abuse/mental health issues will improve  Medication Management: Evaluate patient's response, side effects, and tolerance of medication regimen.  Therapeutic Interventions: 1 to 1 sessions, Unit Group sessions and Medication administration.  Evaluation of Outcomes: Not Met  Physician Treatment Plan for Secondary Diagnosis: Active Problems:   MDD (major depressive disorder), recurrent, severe, with psychosis ([place])  Long Term Goal(s): Improvement in symptoms so as ready for discharge Improvement in symptoms so as ready for discharge   Short Term Goals: Ability to identify changes in lifestyle  to reduce recurrence of condition will improve Ability to identify triggers associated with substance abuse/mental health issues will improve     Medication Management: Evaluate patient's response, side effects, and tolerance of medication regimen.  Therapeutic Interventions: 1 to 1 sessions, Unit Group sessions and Medication administration.  Evaluation of  Outcomes: Not Met   RN Treatment Plan for Primary Diagnosis: <principal problem not specified> Long Term Goal(s): Knowledge of disease and therapeutic regimen to maintain health will improve  Short Term Goals: Ability to identify and develop effective coping behaviors will improve and Compliance with prescribed medications will improve  Medication Management: RN will administer medications as ordered by provider, will assess and evaluate patient's response and provide education to patient for prescribed medication. RN will report any adverse and/or side effects to prescribing provider.  Therapeutic Interventions: 1 on 1 counseling sessions, Psychoeducation, Medication administration, Evaluate responses to treatment, Monitor vital signs and CBGs as ordered, Perform/monitor CIWA, COWS, AIMS and Fall Risk screenings as ordered, Perform wound care treatments as ordered.  Evaluation of Outcomes: Not Met   LCSW Treatment Plan for Primary Diagnosis: <principal problem not specified> Long Term Goal(s): Safe transition to appropriate next level of care at discharge, Engage patient in therapeutic group addressing interpersonal concerns.  Short Term Goals: Engage patient in aftercare planning with referrals and resources, Increase social support and Increase skills for wellness and recovery  Therapeutic Interventions: Assess for all discharge needs, 1 to 1 time with Social worker, Explore available resources and support systems, Assess for adequacy in community support network, Educate family and significant other(s) on suicide prevention, Complete Psychosocial Assessment, Interpersonal group therapy.  Evaluation of Outcomes: Not Met   Progress in Treatment: Attending groups: Yes. Participating in groups: No. Taking medication as prescribed: Yes. Toleration medication: Yes. Family/Significant other contact made: No, will contact:  fiancee Patient understands diagnosis: No. Discussing patient  identified problems/goals with staff: Yes. Medical problems stabilized or resolved: Yes. Denies suicidal/homicidal ideation: Yes. Issues/concerns per patient self-inventory: No. Other: none  New problem(s) identified: No, Describe:  none  New Short Term/Long Term Goal(s):  Patient Goals:  "Just discharge-no goals"  Discharge Plan or Barriers:   Reason for Continuation of Hospitalization: Delusions  Medication stabilization  Estimated Length of Stay: 3-5 days    Attendees: Patient: 10/08/2018 2:16 PM  Physician:  10/08/2018 2:16 PM  Nursing:  10/08/2018 2:16 PM  RN Care Manager: 10/08/2018 2:16 PM  Social Worker:  10/08/2018 2:16 PM Attendees: Patient: 10/08/2018   Physician: Dr. Jake Samples, MD 10/08/2018   Nursing: Boyce Medici, RN 10/08/2018   RN Care Manager: 10/08/2018   Social Worker: Lurline Idol, LCSW 10/08/2018   Recreational Therapist:  10/08/2018   Other:  10/08/2018   Other:  10/08/2018   Other: 10/08/2018     Recreational Therapist:  10/08/2018 2:16 PM  Other:  10/08/2018 2:16 PM  Other:  10/08/2018 2:16 PM  Other: 10/08/2018 2:16 PM    Scribe for Treatment Team: Joanne Chars, LCSW 10/08/2018 2:16 PM

## 2018-10-08 NOTE — Progress Notes (Signed)
Adult Psychoeducational Group Note  Date:  10/08/2018 Time:  8:49 PM  Group Topic/Focus:  Wrap-Up Group:   The focus of this group is to help patients review their daily goal of treatment and discuss progress on daily workbooks.  Participation Level:  Active  Participation Quality:  Appropriate  Affect:  Appropriate  Cognitive:  Oriented  Insight: Appropriate  Engagement in Group:  Engaged  Modes of Intervention:  Socialization  Additional Comments:  Patient attended and participated in group tonight. She reports that the day was OK. She did not do anything. She went to group, eat, and talk to family members  Debe Coder 10/08/2018, 8:49 PM

## 2018-10-08 NOTE — BHH Group Notes (Signed)
Date: 10/08/18, 1330  Type of Therapy and Topic: Chaplain group, "Hope is.." Chaplain engaged group in discussion about hope and what it looks like in each patients life and current situation.  Participation level:none  Modes of Intervention: Discussion, Education and Socialization  Summary of Progress/Problems:Pt came to group but curled up in a chair and slept through the entire session.     Joanne Chars, [place] LCSW Group Therapy Note

## 2018-10-08 NOTE — B[facility] Group Notes (Signed)
The focus of this group is to help patients establish daily goals to achieve during treatment and discuss how the patient can incorporate goal setting into their daily lives to aide in recovery.  Pt did not attend Goals group.

## 2018-10-08 NOTE — Progress Notes (Signed)
Writer spoke with patient 1:1 and she reported having had a good day. She reported that she ran out of her medicines and couldn't get them refilled. She reported that she was in a bad place and when police came they threw all of her medicines away. She has been up in the dayroom on the phone. She continues to state that she is pregnant. Safety maintained on unit with 15 min checks.

## 2018-10-08 NOTE — Plan of Care (Signed)
  Problem: Activity: Goal: Interest or engagement in activities will improve Outcome: Progressing Goal: Sleeping patterns will improve Outcome: Progressing   

## 2018-10-08 NOTE — Progress Notes (Signed)
Psychoeducational Group Note  Date:  10/08/2018 Time:  1650  Group Topic/Focus:  Recovery Goals:   The focus of this group is to identify appropriate goals for recovery and establish a plan to achieve them.  Participation Level: Did Not Attend  Participation Quality:  Not Applicable  Affect:  Not Applicable  Cognitive:  Not Applicable  Insight:  Not Applicable  Engagement in Group: Not Applicable  Additional Comments:  Pt was sitting in the day room at the start of the group session but left the day room as the group got underway.  Marilea Gwynne E 10/08/2018, 5:02 PM

## 2018-10-08 NOTE — BHH Suicide Risk Assessment (Signed)
[place] INPATIENT:  Family/Significant Other Suicide Prevention Education  Suicide Prevention Education:  Education Completed; Freddy Finner, [phone], has been identified by the patient as the family member/significant other with whom the patient will be residing, and identified as the person(s) who will aid the patient in the event of a mental health crisis (suicidal ideations/suicide attempt).  With written consent from the patient, the family member/significant other has been provided the following suicide prevention education, prior to the and/or following the discharge of the patient.  The suicide prevention education provided includes the following:  Suicide risk factors  Suicide prevention and interventions  National Suicide Hotline telephone number  [facility] assessment telephone number  [facility] Emergency Assistance [place] and/or Residential Mobile Crisis Unit telephone number  Request made of family/significant other to:  Remove weapons (e.g., guns, rifles, knives), all items previously/currently identified as safety concern.  No guns in the home, per fiancee.   Remove drugs/medications (over-the-counter, prescriptions, illicit drugs), all items previously/currently identified as a safety concern.  The family member/significant other verbalizes understanding of the suicide prevention education information provided.  The family member/significant other agrees to remove the items of safety concern listed above.  Celesta Gentile reports that with both of pt's current children, pt was told that she was not pregnant and then around 6 months, suddenly there is a baby.  Pt has been a little stressed lately but otherwise has been acting normally.  Husband wanted to go to the ED with pt prior to this admission but was not allowed to ride in the ambulance.  He thinks he could have prevented this admission.    Joanne Chars, LCSW 10/08/2018, 4:10  PM

## 2018-10-08 NOTE — Progress Notes (Signed)
[facility] MD Progress Note  10/08/2018 11:13 AM Theresa Mccoy  MRN:  [id number] Subjective:   Patient generally staying in bed, this is hospital day #2 she has assistant delusions that she is pregnant, and when I asked her she feels reassured that her pregnancy test is negative and show her the results she states "no" and continues to insist she is pregnant. On the phone with relatives she complains that we are "not doing anything for her" using a lot of profanity and arguing with family members but then becomes quickly contained in mood and behavior after the phone call. Poor insight and continued and persistent delusions and I believe these delusions a pregnancy may in fact be treatment resistant over time. Principal Problem: Chronic psychosis/treatment resistance Diagnosis: Active Problems:   MDD (major depressive disorder), recurrent, severe, with psychosis ([place])  Total Time spent with patient: 20 minutes  Past Medical History:  Past Medical History:  Diagnosis Date  . Anemia   . Depression   . Hyperthyroidism   . Thyroid disease     Past Surgical History:  Procedure Laterality Date  . CESAREAN SECTION    . HAND SURGERY Left   . SALPINGECTOMY     Family History: History reviewed. No pertinent family history.  Social History:  Social History   Substance and Sexual Activity  Alcohol Use No   Comment: sober since 02-2016     Social History   Substance and Sexual Activity  Drug Use Yes  . Types: Marijuana    Social History   Socioeconomic History  . Marital status: Single    Spouse name: Not on file  . Number of children: 2  . Years of education: Not on file  . Highest education level: Not on file  Occupational History  . Occupation: Unemployed  Social Needs  . Financial resource strain: Not on file  . Food insecurity:    Worry: Not on file    Inability: Not on file  . Transportation needs:    Medical: Not on file    Non-medical: Not on file  Tobacco Use  .  Smoking status: Current Every Day Smoker    Packs/day: 0.25    Types: Cigarettes  . Smokeless tobacco: Never Used  Substance and Sexual Activity  . 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 Drug use: Yes    Types: Marijuana  . Sexual activity: Yes  Lifestyle  . Physical activity:    Days per week: Not on file    Minutes per session: Not on file  . Stress: Not on file  Relationships  . Social connections:    Talks on phone: Not on file    Gets together: Not on file    Attends religious service: Not on file    Active member of club or organization: Not on file    Attends meetings of clubs or organizations: Not on file    Relationship status: Not on file  Other Topics Concern  . Not on file  Social History Narrative   Pt stated that she lives in [place] with her fiance and two children, that she recently moved from [place], and also that she is pregnant.  She also stated that she is unemployed.   Additional Social History:                         Sleep: Good  Appetite:  Good  Current Medications: Current Facility-Administered Medications  Medication Dose Route Frequency Provider Last Rate Last Dose  . benztropine (COGENTIN) tablet 0.5 mg  0.5 mg Oral BID Johnn Hai, MD   0.5 mg at 10/08/18 0914  . gabapentin (NEURONTIN) capsule 300 mg  300 mg Oral TID Johnn Hai, MD   300 mg at 10/08/18 0914  . OLANZapine zydis (ZYPREXA) disintegrating tablet 5 mg  5 mg Oral Q8H PRN Cobos, Myer Peer, MD   5 mg at 10/08/18 0915   And  . LORazepam (ATIVAN) tablet 1 mg  1 mg Oral PRN Cobos, Myer Peer, MD       And  . ziprasidone (GEODON) injection 10 mg  10 mg Intramuscular PRN Cobos, Myer Peer, MD      . risperiDONE (RISPERDAL) tablet 2 mg  2 mg Oral BID Johnn Hai, MD   2 mg at 10/08/18 0914  . temazepam (RESTORIL) capsule 30 mg  30 mg Oral QHS Johnn Hai, MD        Lab Results: No results found for this or any previous visit (from the past 48  hour(s)).  Blood Alcohol level:  No results found for: [facility]  Metabolic Disorder Labs: Lab Results  Component Value Date   HGBA1C 5.3 02/02/2017   No results found for: PROLACTIN No results found for: CHOL, TRIG, HDL, CHOLHDL, VLDL, LDLCALC  Physical Findings: AIMS: Facial and Oral Movements Muscles of Facial Expression: None, normal Lips and Perioral Area: None, normal Jaw: None, normal Tongue: None, normal,Extremity Movements Upper (arms, wrists, hands, fingers): None, normal Lower (legs, knees, ankles, toes): None, normal, Trunk Movements Neck, shoulders, hips: None, normal, Overall Severity Severity of abnormal movements (highest score from questions above): None, normal Incapacitation due to abnormal movements: None, normal Patient's awareness of abnormal movements (rate only patient's report): No Awareness, Dental Status Current problems with teeth and/or dentures?: No Does patient usually wear dentures?: No  CIWA:    COWS:     Musculoskeletal: Strength & Muscle Tone: within normal limits Gait & Station: normal Patient leans: N/A  Psychiatric Specialty Exam: Physical Exam  ROS  Blood pressure (!) 127/98, pulse (!) 107, temperature 98.1 F (36.7 C), temperature source Oral, resp. rate 16, height 5' (1.524 m), weight 55.8 kg, last menstrual period 01/22/2018, unknown if currently breastfeeding.Body mass index is 24.02 kg/m.  General Appearance: Casual  Eye Contact:  Minimal  Speech:  Normal Rate  Volume:  Increased  Mood:  Angry and Irritable  Affect:  Appropriate  Thought Process:  Irrelevant  Orientation:  Full (Time, Place, and Person)  Thought Content:  Delusions and Tangential  Suicidal Thoughts:  No  Homicidal Thoughts:  No  Memory:  Immediate;   Fair  Judgement:  Impaired  Insight:  Lacking  Psychomotor Activity:  Decreased  Concentration:  Concentration: Fair  Recall:  [employer] of Knowledge:  Fair  Language:  Fair  Akathisia:  Negative  Handed:   Right  AIMS (if indicated):     Assets:  Physical Health Resilience  ADL's:  Intact  Cognition:  WNL  Sleep:  Number of Hours: 4.75     Treatment Plan Summary: Daily contact with patient to assess and evaluate symptoms and progress in treatment, Medication management and Plan Continue current meds and precautions but escalate antipsychotic as tolerated discussed clozapine the patient would not commit to blood draws.  Johnn Hai, MD 10/08/2018, 11:13 AM

## 2018-10-08 NOTE — Plan of Care (Signed)
  Problem: Activity: Goal: Interest or engagement in activities will improve 10/08/2018 1608 by Harriet Masson, RN Outcome: Progressing   Problem: Coping: Goal: Ability to verbalize frustrations and anger appropriately will improve 10/08/2018 1608 by Harriet Masson, RN Outcome: Progressing  D: Pt alert and oriented on the unit. Pt denies SI/HI, A/VH. Pt's affect was flat when she interacted with RN staff. Pt denied any pain and participated during unit groups and activities. Pt is pleasant and cooperative. A: Education, support and encouragement provided, q15 minute safety checks remain in effect. Medications administered per MD orders. R: No reactions/side effects to medicine noted. Pt denies any concerns at this time, and verbally contracts for safety. Pt ambulating on the unit with no issues. Pt remains safe on the unit.

## 2018-10-09 NOTE — Plan of Care (Signed)
D: Patient presents delusional, anxious. She is somatically preoccupied and believes she has two heart beats. She also believes that she is pregnant, but Upreg negative. She slept well and declined medication to help with sleep. Her appetite is fair, energy normal and concentration good. She rates her depression 2/10, hopelessness 9/10, and anxiety 3/10. She denies withdrawal symptoms. Patient denies SI/HI/AVH. She has pain in her arms 9/10. Offered APAP, but patient declined.  A: Patient checked q15 min, and checks reviewed. Reviewed medication changes with patient and educated on side effects. Educated patient on importance of attending group therapy sessions and educated on several coping skills. Encouarged participation in milieu through recreation therapy and attending meals with peers. Support and encouragement provided. Fluids offered. R: Patient receptive to education on medications, and is medication compliant. Patient contracts for safety on the unit. Goal: "getting out of here is my only goal"  Problem: Education: Goal: Emotional status will improve Outcome: Not Progressing Goal: Mental status will improve Outcome: Not Progressing   Problem: Activity: Goal: Interest or engagement in activities will improve Outcome: Not Progressing Goal: Sleeping patterns will improve Outcome: Not Progressing

## 2018-10-09 NOTE — Progress Notes (Signed)
Writer walked to patients room and observed her sitting in the bathroom, lights off and heat on high. Writer thought she was using the restroom and she replied " no I'm just sitting in here." Writer asked that she come to nursing station. She reports having had a good day, attended groups and ate her meals. She reported talking to her boyfriend today and how she misses him. She also talked about her three babies that she is now pregnant with and how her hormones are dead because her babies make her tired.She is still delusional and becomes agitated if trying to explain to her that she is not. Safety maintained on unit with 15 min checks.

## 2018-10-09 NOTE — [facility] Group Notes (Signed)
  [facility] LCSW Group Therapy Note  Date/Time:  10/09/2018 11:15AM-12:00PM  Type of Therapy and Topic:  Group Therapy:  Feelings About Hospitalization  Participation Level:  Active   Description of Group This process group involved patients discussing their feelings related to being hospitalized, as well as the benefits they see to being in the hospital.  These feelings and benefits were itemized.  The group then brainstormed specific ways in which they could seek those same benefits when they discharge and return home.  A special emphasis was placed on the current pandemic issue and the need for social distancing in the midst of taking care of one's needs.  Therapeutic Goals 1. Patient will identify and describe positive and negative feelings related to hospitalization 2. Patient will verbalize benefits of hospitalization to themselves personally 3. Patients will brainstorm together ways they can obtain similar benefits in the outpatient setting, identify barriers to wellness and possible solutions  Summary of Patient Progress:  The patient expressed her primary feelings about being hospitalized are confused and negative.  She complained throughout group about the manner of her hospitalization from another location, saying nothing was explained to her, and she was "lied to multiple times and the nurse made me break down."  She said she does not feel safe, is "worse than I am" and is aggravated her fiance cannot visit her.  She expressed concern about riding the bus home because her fiance is the one who knows the bus system.  CSW told her that fiance can be in the lobby when she discharges and can accompany her home to ease the transition.  This made her happy.    Therapeutic Modalities Cognitive Behavioral Therapy Motivational Interviewing    Selmer Dominion, LCSW 10/09/2018, 5:30 PM

## 2018-10-09 NOTE — [facility] Group Notes (Signed)
[facility] Group Notes:  (Nursing/MHT/Case Management/Adjunct)  Date:  10/09/2018  Time:  4:00 PM  Type of Therapy:  Nurse Education  Participation Level:  Active  Participation Quality:  Appropriate and Attentive  Affect:  Appropriate  Cognitive:  Alert and Appropriate  Insight:  Appropriate  Engagement in Group:  Engaged  Modes of Intervention:  Discussion, Education and Exploration  Summary of Progress/Problems: pt's discussed anger and coping mechanisms to utilize in their plan of care.    Otelia Limes Scarlet Abad 10/09/2018, 5:38 PM

## 2018-10-09 NOTE — Progress Notes (Addendum)
[facility] MD Progress Note  10/09/2018 11:22 AM Theresa Mccoy  MRN:  [id number]  Evaluation: Theresa Mccoy observed sitting in day room.  Patient attended daily group sessions with active and engaged participation.  Reports dizziness and unsteady gait due to her pregnancy.  Patient reports some nausea after eating a peanut butter ad jelly after lunch.  she denies suicidal or homicidal ideations.  Denies auditory or visual hallucinations.  Chart review patient presented with delusions of pregnancy.  States she is missing her fianc and her roommate.  Reports taking and tolerating medications well.  Reports a good appetite.  States she is resting well throughout the night.  Support encouragement reassurance was provided.  History: Theresa Mccoy is 38 years of age and she suffers from the specific delusion that she is actually pregnant this is also in the context of being reassured repeatedly that her pregnancy test is negative, and in the context of a depressive disorder with recent suicidal thinking.  Her suicidal thinking is not involving any plans at the present time but she is able to contract for safety here.  Principal Problem: Chronic psychosis/treatment resistance Diagnosis: Active Problems:   MDD (major depressive disorder), recurrent, severe, with psychosis ([place])  Total Time spent with patient: 20 minutes  Past Medical History:  Past Medical History:  Diagnosis Date  . Anemia   . Depression   . Hyperthyroidism   . Thyroid disease     Past Surgical History:  Procedure Laterality Date  . CESAREAN SECTION    . HAND SURGERY Left   . SALPINGECTOMY     Family History: History reviewed. No pertinent family history.  Social History:  Social History   Substance and Sexual Activity  Alcohol Use No   Comment: sober since 02-2016     Social History   Substance and Sexual Activity  Drug Use Yes  . Types: Marijuana    Social History   Socioeconomic History  . Marital status: Single    Spouse  name: Not on file  . Number of children: 2  . Years of education: Not on file  . Highest education level: Not on file  Occupational History  . Occupation: Unemployed  Social Needs  . Financial resource strain: Not on file  . Food insecurity:    Worry: Not on file    Inability: Not on file  . Transportation needs:    Medical: Not on file    Non-medical: Not on file  Tobacco Use  . Smoking status: Current Every Day Smoker    Packs/day: 0.25    Types: Cigarettes  . Smokeless tobacco: Never Used  Substance and Sexual Activity  . 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 Drug use: Yes    Types: Marijuana  . Sexual activity: Yes  Lifestyle  . Physical activity:    Days per week: Not on file    Minutes per session: Not on file  . Stress: Not on file  Relationships  . Social connections:    Talks on phone: Not on file    Gets together: Not on file    Attends religious service: Not on file    Active member of club or organization: Not on file    Attends meetings of clubs or organizations: Not on file    Relationship status: Not on file  Other Topics Concern  . Not on file  Social History Narrative   Pt stated that she lives in [place] with her fiance and  two children, that she recently moved from [place], and also that she is pregnant.  She also stated that she is unemployed.   Additional Social History:                         Sleep: Good  Appetite:  Good  Current Medications: Current Facility-Administered Medications  Medication Dose Route Frequency Provider Last Rate Last Dose  . benztropine (COGENTIN) tablet 0.5 mg  0.5 mg Oral BID Johnn Hai, MD   0.5 mg at 10/09/18 0825  . gabapentin (NEURONTIN) capsule 300 mg  300 mg Oral TID Johnn Hai, MD   300 mg at 10/09/18 0825  . OLANZapine zydis (ZYPREXA) disintegrating tablet 5 mg  5 mg Oral Q8H PRN , Myer Peer, MD   5 mg at 10/08/18 0915   And  . ziprasidone (GEODON) injection 10 mg  10  mg Intramuscular PRN , Myer Peer, MD      . risperiDONE (RISPERDAL) tablet 2 mg  2 mg Oral Daily Johnn Hai, MD   2 mg at 10/09/18 0825  . risperiDONE (RISPERDAL) tablet 4 mg  4 mg Oral QHS Johnn Hai, MD      . temazepam (RESTORIL) capsule 30 mg  30 mg Oral QHS Johnn Hai, MD   30 mg at 10/08/18 2201    Lab Results: No results found for this or any previous visit (from the past 36 hour(s)).  Blood Alcohol level:  No results found for: [facility]  Metabolic Disorder Labs: Lab Results  Component Value Date   HGBA1C 5.3 02/02/2017   No results found for: PROLACTIN No results found for: CHOL, TRIG, HDL, CHOLHDL, VLDL, LDLCALC  Physical Findings: AIMS: Facial and Oral Movements Muscles of Facial Expression: None, normal Lips and Perioral Area: None, normal Jaw: None, normal Tongue: None, normal,Extremity Movements Upper (arms, wrists, hands, fingers): None, normal Lower (legs, knees, ankles, toes): None, normal, Trunk Movements Neck, shoulders, hips: None, normal, Overall Severity Severity of abnormal movements (highest score from questions above): None, normal Incapacitation due to abnormal movements: None, normal Patient's awareness of abnormal movements (rate only patient's report): No Awareness, Dental Status Current problems with teeth and/or dentures?: No Does patient usually wear dentures?: No  CIWA:  CIWA-Ar Total: 0 COWS:  COWS Total Score: 0  Musculoskeletal: Strength & Muscle Tone: within normal limits Gait & Station: normal Patient leans: N/A  Psychiatric Specialty Exam: Physical Exam  Vitals reviewed. Cardiovascular: Normal rate.  Neurological: She is alert.  Psychiatric: She has a normal mood and affect. Her behavior is normal.    Review of Systems  Psychiatric/Behavioral: Negative for depression. The patient is not nervous/anxious (stable ).   All other systems reviewed and are negative.   Blood pressure (!) 129/98, pulse (!) 128, temperature 98.1  F (36.7 C), temperature source Oral, resp. rate 16, height 5' (1.524 m), weight 55.8 kg, last menstrual period 01/22/2018, unknown if currently breastfeeding.Body mass index is 24.02 kg/m.  General Appearance: Casual  Eye Contact:  Minimal  Speech:  Normal Rate  Volume:  Normal  Mood:  Angry and Irritable  Affect:  Appropriate  Thought Process:  Irrelevant  Orientation:  Full (Time, Place, and Person)  Thought Content:  Delusions and Tangential  Suicidal Thoughts:  No  Homicidal Thoughts:  No  Memory:  Immediate;   Fair  Judgement:  Impaired  Insight:  Lacking  Psychomotor Activity:  Decreased  Concentration:  Concentration: Fair  Recall:  [employer]  of Knowledge:  Fair  Language:  Fair  Akathisia:  Negative  Handed:  Right  AIMS (if indicated):     Assets:  Communication Skills Desire for Improvement Physical Health Resilience  ADL's:  Intact  Cognition:  WNL  Sleep:  Number of Hours: 2.75     Treatment Plan Summary: Daily contact with patient to assess and evaluate symptoms and progress in treatment and Medication management   Continue with current treatment plan on 10/09/2018 as listed below except for noted   Delusional:  Continue Cogentin 0.5 mg p.o. twice daily Continue Resporal 2 mg p.o. daily and 4 mg p.o. nightly Continue Restoril 30 mg p.o. nightly  See chart for agitation protocol CSW to continue working on discharge disposition Patient encouraged to continue participating within the therapeutic milieu  Derrill [place], NP 10/09/2018, 11:22 AM   Attest to NP Progress Note

## 2018-10-10 MED ORDER — HYDROCERIN EX CREA
TOPICAL_CREAM | Freq: Two times a day (BID) | CUTANEOUS | Status: DC
  Administered 2018-10-10 – 2018-10-11 (×2): via TOPICAL
  Filled 2018-10-10 (×2): qty 113

## 2018-10-10 MED ORDER — DIPHENHYDRAMINE HCL 25 MG PO CAPS
25.0000 mg | ORAL_CAPSULE | Freq: Three times a day (TID) | ORAL | Status: DC | PRN
  Administered 2018-10-10: 19:00:00 25 mg via ORAL
  Filled 2018-10-10: qty 1

## 2018-10-10 NOTE — [facility] Group Notes (Signed)
[facility] LCSW Group Therapy Note  Date/Time:  10/10/2018  11:00AM-12:00PM  Type of Therapy and Topic:  Group Therapy:  Music and Mood  Participation Level:  Active   Description of Group: In this process group, members listened to a variety of genres of music and identified that different types of music evoke different responses.  Patients were encouraged to identify music that was soothing for them and music that was energizing for them.  Patients discussed how this knowledge can help with wellness and recovery in various ways including managing depression and anxiety as well as encouraging healthy sleep habits.    Therapeutic Goals: 1. Patients will explore the impact of different varieties of music on mood 2. Patients will verbalize the thoughts they have when listening to different types of music 3. Patients will identify music that is soothing to them as well as music that is energizing to them 4. Patients will discuss how to use this knowledge to assist in maintaining wellness and recovery 5. Patients will explore the use of music as a coping skill  Summary of Patient Progress:  At the beginning of group, patient expressed that she felt good and relaxed.  She participated fully throughout group, often on topic and sometimes muttering to herself throughout a song despite the fact that others were trying to listen to the music.  She made attempts to answer the question of how different songs made her feel, although her thought processes were at times disorganized and this was difficult.  At the end of group she said she felt "better."  Therapeutic Modalities: Solution Focused Brief Therapy Activity   Selmer Dominion, LCSW

## 2018-10-10 NOTE — Plan of Care (Signed)
D: Patient presents bizarre, confused. She came to the medication window and while receiving her meds, she began to urinate on the floor. She continues to believe that she is pregnant, now with triplets. Her speech is disorganized. She believes that she does not need to be here. Patient denies SI/HI/AVH. She slept well, and declined medication to help. Her appetite is fair, energy high and concentration poor. She rates her depression 8/10, hopelessness 9/10 and anxiety 5/10. She has numerous somatic complaints.  A: Patient checked q15 min, and checks reviewed. Reviewed medication changes with patient and educated on side effects. Educated patient on importance of attending group therapy sessions and educated on several coping skills. Encouarged participation in milieu through recreation therapy and attending meals with peers. Support and encouragement provided. Fluids offered. R: Patient receptive to education on medications, and is medication compliant. Patient contracts for safety on the unit. Her goal is "getting home."   Problem: Education: Goal: Emotional status will improve Outcome: Not Progressing Goal: Mental status will improve Outcome: Not Progressing   Problem: Activity: Goal: Interest or engagement in activities will improve Outcome: Not Progressing Goal: Sleeping patterns will improve Outcome: Not Progressing

## 2018-10-10 NOTE — Progress Notes (Addendum)
[facility] MD Progress Note  10/10/2018 10:40 AM Theresa Mccoy  MRN:  [id number]  Evaluation: [place] observed standing at nurses station completing daily self inventory sheet.  Continues to report delusions and confusion with current pregnancy.  Was reported patient urinated on herself during medication past.    Patient reports a weak bladder related to her third pregnancy states she is 34 weeks and reported " I peed on myself with my other 2 children." states her children are 55 and 68 years old. Theresa Mccoy is  denying suicidal or homicidal ideations.  Denies auditory or  visual hallucinations.  Reports paranoia and suspicious activity of staff members during daily checks.  Reports taking medications as prescribed without medication side effects.  Reports a good appetite. Patient reported her dizziness has resolved continues to report concerns with unsteady gait due to pregnancy.  States she is rested "okay" throughout the night, states she hears people above her while she is trying to rest at night.  Support encouragement reassurance was provided.  History: Theresa Mccoy is 38 years of age and she suffers from the specific delusion that she is actually pregnant this is also in the context of being reassured repeatedly that her pregnancy test is negative, and in the context of a depressive disorder with recent suicidal thinking.  Her suicidal thinking is not involving any plans at the present time but she is able to contract for safety here.  Principal Problem: Chronic psychosis/treatment resistance Diagnosis: Active Problems:   MDD (major depressive disorder), recurrent, severe, with psychosis ([place])  Total Time spent with patient: 20 minutes  Past Medical History:  Past Medical History:  Diagnosis Date  . Anemia   . Depression   . Hyperthyroidism   . Thyroid disease     Past Surgical History:  Procedure Laterality Date  . CESAREAN SECTION    . HAND SURGERY Left   . SALPINGECTOMY     Family  History: History reviewed. No pertinent family history.  Social History:  Social History   Substance and Sexual Activity  Alcohol Use No   Comment: sober since 02-2016     Social History   Substance and Sexual Activity  Drug Use Yes  . Types: Marijuana    Social History   Socioeconomic History  . Marital status: Single    Spouse name: Not on file  . Number of children: 2  . Years of education: Not on file  . Highest education level: Not on file  Occupational History  . Occupation: Unemployed  Social Needs  . Financial resource strain: Not on file  . Food insecurity:    Worry: Not on file    Inability: Not on file  . Transportation needs:    Medical: Not on file    Non-medical: Not on file  Tobacco Use  . Smoking status: Current Every Day Smoker    Packs/day: 0.25    Types: Cigarettes  . Smokeless tobacco: Never Used  Substance and Sexual Activity  . 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 Drug use: Yes    Types: Marijuana  . Sexual activity: Yes  Lifestyle  . Physical activity:    Days per week: Not on file    Minutes per session: Not on file  . Stress: Not on file  Relationships  . Social connections:    Talks on phone: Not on file    Gets together: Not on file    Attends religious service: Not on file  Active member of club or organization: Not on file    Attends meetings of clubs or organizations: Not on file    Relationship status: Not on file  Other Topics Concern  . Not on file  Social History Narrative   Pt stated that she lives in [place] with her fiance and two children, that she recently moved from [place], and also that she is pregnant.  She also stated that she is unemployed.   Additional Social History:                         Sleep: Good  Appetite:  Good  Current Medications: Current Facility-Administered Medications  Medication Dose Route Frequency Provider Last Rate Last Dose  . benztropine (COGENTIN)  tablet 0.5 mg  0.5 mg Oral BID Johnn Hai, MD   0.5 mg at 10/10/18 4193  . gabapentin (NEURONTIN) capsule 300 mg  300 mg Oral TID Johnn Hai, MD   300 mg at 10/10/18 7902  . OLANZapine zydis (ZYPREXA) disintegrating tablet 5 mg  5 mg Oral Q8H PRN Cobos, Myer Peer, MD   5 mg at 10/08/18 0915   And  . ziprasidone (GEODON) injection 10 mg  10 mg Intramuscular PRN Cobos, Myer Peer, MD      . risperiDONE (RISPERDAL) tablet 2 mg  2 mg Oral Daily Johnn Hai, MD   2 mg at 10/10/18 4097  . risperiDONE (RISPERDAL) tablet 4 mg  4 mg Oral QHS Johnn Hai, MD   4 mg at 10/09/18 2125  . temazepam (RESTORIL) capsule 30 mg  30 mg Oral QHS Johnn Hai, MD   30 mg at 10/09/18 2126    Lab Results: No results found for this or any previous visit (from the past 3 hour(s)).  Blood Alcohol level:  No results found for: [facility]  Metabolic Disorder Labs: Lab Results  Component Value Date   HGBA1C 5.3 02/02/2017   No results found for: PROLACTIN No results found for: CHOL, TRIG, HDL, CHOLHDL, VLDL, LDLCALC  Physical Findings: AIMS: Facial and Oral Movements Muscles of Facial Expression: None, normal Lips and Perioral Area: None, normal Jaw: None, normal Tongue: None, normal,Extremity Movements Upper (arms, wrists, hands, fingers): None, normal Lower (legs, knees, ankles, toes): None, normal, Trunk Movements Neck, shoulders, hips: None, normal, Overall Severity Severity of abnormal movements (highest score from questions above): None, normal Incapacitation due to abnormal movements: None, normal Patient's awareness of abnormal movements (rate only patient's report): No Awareness, Dental Status Current problems with teeth and/or dentures?: No Does patient usually wear dentures?: No  CIWA:  CIWA-Ar Total: 0 COWS:  COWS Total Score: 0  Musculoskeletal: Strength & Muscle Tone: within normal limits Gait & Station: slow and deliberate wide-based gait Patient leans: N/A  Psychiatric Specialty  Exam: Physical Exam  Vitals reviewed. Cardiovascular: Normal rate.  Neurological: She is alert.  Psychiatric: She has a normal mood and affect. Her behavior is normal.    Review of Systems  Psychiatric/Behavioral: Negative for depression. The patient is not nervous/anxious (stable ).   All other systems reviewed and are negative.   Blood pressure (!) 122/93, pulse (!) 140, temperature 98.8 F (37.1 C), temperature source Oral, resp. rate 16, height 5' (1.524 m), weight 55.8 kg, last menstrual period 01/22/2018, unknown if currently breastfeeding.Body mass index is 24.02 kg/m.  General Appearance: Casual  Eye Contact:  Minimal  Speech:  Normal Rate  Volume:  Normal  Mood:  Angry and Irritable  Affect:  Appropriate  Thought Process:  Irrelevant  Orientation:  Full (Time, Place, and Person)  Thought Content:  Delusions and Tangential  Suicidal Thoughts:  No  Homicidal Thoughts:  No  Memory:  Immediate;   Fair  Judgement:  Impaired  Insight:  Lacking  Psychomotor Activity:  Decreased  Concentration:  Concentration: Fair  Recall:  [employer] of Knowledge:  Fair  Language:  Fair  Akathisia:  Negative  Handed:  Right  AIMS (if indicated):     Assets:  Communication Skills Desire for Improvement Physical Health Resilience  ADL's:  Intact  Cognition:  WNL  Sleep:  Number of Hours: 2.75     Treatment Plan Summary: Daily contact with patient to assess and evaluate symptoms and progress in treatment and Medication management   Continue with current treatment plan on 10/10/2018 as listed below except for noted  Delusional:  Continue Cogentin 0.5 mg p.o. twice daily Continue Risperdal 2 mg p.o. daily and Risperdal  4 mg p.o. nightly Continue Restoril 30 mg p.o. nightly  See chart for agitation protocol CSW to continue working on discharge disposition Patient encouraged to continue participating within the therapeutic milieu  Derrill Center, NP 10/10/2018, 10:40 AM     4/5 /2020 at 5,20 PM Patient reports itching/ rash on back and forearms . Examined with female RN Public librarian) .  Skin appears dry and there are marks associated with scratching herself on back.  No macular or papular lesions noted, no blistering , does have slight  redness on forearms .  No mucosal involvement. No systemic symptoms or fever .  Will start Benadryl PRN for itching as needed and Eucerin Lotion PRN for dry skin.  Attest to NP Progress Note   F Cobos MD

## 2018-10-11 MED ORDER — GABAPENTIN 300 MG PO CAPS: 300.0000 mg | ORAL_CAPSULE | Freq: Three times a day (TID) | ORAL | 1 refills | Status: DC

## 2018-10-11 MED ORDER — RISPERIDONE MICROSPHERES ER 50 MG IM SRER
50.0000 mg | INTRAMUSCULAR | Status: DC
  Administered 2018-10-11: 12:00:00 50 mg via INTRAMUSCULAR
  Filled 2018-10-11: qty 2

## 2018-10-11 MED ORDER — RISPERIDONE 2 MG PO TABS: ORAL_TABLET | ORAL | 2 refills | Status: DC

## 2018-10-11 MED ORDER — BENZTROPINE MESYLATE 0.5 MG PO TABS: 0.5000 mg | ORAL_TABLET | Freq: Two times a day (BID) | ORAL | 1 refills | Status: DC

## 2018-10-11 MED ORDER — RISPERIDONE MICROSPHERES ER 50 MG IM SRER: 50.0000 mg | INTRAMUSCULAR | 11 refills | Status: DC

## 2018-10-11 MED FILL — risperiDONE 2 MG TABS: 2 | 30 days supply | Qty: 90 | Fill #0

## 2018-10-11 NOTE — Progress Notes (Signed)
Pt discharged to lobby. Pt was stable and appreciative at that time. All papers and prescriptions were given and valuables returned. Verbal understanding expressed. Denies SI/HI and A/VH. Pt given opportunity to express concerns and ask questions.  

## 2018-10-11 NOTE — Plan of Care (Signed)
Pt attended one group session.  Pt is being discharged.   Victorino Sparrow, LRT/CTRS

## 2018-10-11 NOTE — [facility] Suicide Risk Assessment (Signed)
[facility] Discharge Suicide Risk Assessment   Principal Problem: Exacerbation in psychotic disorder Discharge Diagnoses: Active Problems:   MDD (major depressive disorder), recurrent, severe, with psychosis ([place])   Total Time spent with patient: 45 minutes Lesions appear to be treatment resistant at baseline but no thoughts of harming self or others and contracting fully and understands the implications of contracting  Mental Status Per Nursing Assessment::   On Admission:  NA  Demographic Factors:  Low socioeconomic status  Loss Factors: NA  Historical Factors: Impulsivity  Risk Reduction Factors:   Responsible for children under 38 years of age  Continued Clinical Symptoms:  Schizophrenia:   Paranoid or undifferentiated type  Cognitive Features That Contribute To Risk:  Closed-mindedness    Suicide Risk:  Minimal: No identifiable suicidal ideation.  Patients presenting with no risk factors but with morbid ruminations; may be classified as minimal risk based on the severity of the depressive symptoms  Follow-up Information    [facility] Follow up on 10/13/2018.   Why:  Telephonic hospital follow up appointment is Wednesday, 4/8 at 9:00a.  At this time, your appointment will be conducted over the telephone. The provider will contact you.  Contact information: [address] [place] [address] [phone]           Plan Of Care/Follow-up recommendations:  Activity:  full  Deidra Spease, MD 10/11/2018, 10:35 AM

## 2018-10-11 NOTE — Progress Notes (Signed)
  [facility] Adult Case Management Discharge Plan :  Will you be returning to the same living situation after discharge:  Yes,  with fiancee At discharge, do you have transportation home?: Yes,  fiancee Do you have the ability to pay for your medications: Yes,  medicaid  Release of information consent forms completed and in the chart;  Patient's signature needed at discharge.  Patient to Follow up at: Follow-up Information    [facility] Follow up on 10/13/2018.   Why:  Telephonic hospital follow up appointment is Wednesday, 4/8 at 9:00a.  At this time, your appointment will be conducted over the telephone. The provider will contact you.  Contact information: [address] [place] [address] [phone]           Next level of care provider has access to [place] and Suicide Prevention discussed: Yes,  with fiancee  Have you used any form of tobacco in the last 30 days? (Cigarettes, Smokeless Tobacco, Cigars, and/or Pipes): Yes  Has patient been referred to the Quitline?: Patient refused referral  Patient has been referred for addiction treatment: Yes, Drue Stager, [place] 10/11/2018, 9:46 AM

## 2018-10-11 NOTE — Progress Notes (Signed)
Patient continues to ruminate about her being pregnant and reports she is due to deliver on 4/25. Writer asked if she did a pregnancy test done at home and she reported that she did and it was positive and her doctor gave her a note saying she was pregnant. She reports that is the reason she stopped taking her medicines because it will harm the baby. She was compliant with her medications. Safety maintained with 15 min checks.

## 2018-10-11 NOTE — Progress Notes (Signed)
Recreation Therapy Notes  Date: 4.6.20 Time: 1000 Location: [address] Dayroom  Group Topic: Wellness  Goal Area(s) Addresses:  Patient will define components of whole wellness. Patient will verbalize benefit of whole wellness.  Behavioral Response: Engaged  Intervention:  Music  Activity: Exercise.    Education: Wellness, Dentist.   Education Outcome: Acknowledges education/In group clarification offered/Needs additional education.   Clinical Observations/Feedback:  Pt was smiling and bright during group activity. Pt tried to keep up as best she could.  Pt was appropriate and able to remain on task.    Victorino Sparrow, LRT/CTRS         Ria Comment, [place] Lafontaine A 10/11/2018 11:00 AM
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
## 2018-10-11 NOTE — Progress Notes (Signed)
Recreation Therapy Notes  INPATIENT RECREATION TR PLAN  Patient Details Name: Theresa Mccoy MRN: [id number] DOB: [birth year] Today's Date: 10/11/2018  Rec Therapy Plan Is patient appropriate for Therapeutic Recreation?: Yes Treatment times per week: about 3 days Estimated Length of Stay: 5-7 days TR Treatment/Interventions: Group participation (Comment)  Discharge Criteria Pt will be discharged from therapy if:: Discharged Treatment plan/goals/alternatives discussed and agreed upon by:: Patient/family  Discharge Summary Short term goals set: See patient care plan Short term goals met: Not met Progress toward goals comments: Groups attended Which groups?: Wellness Reason goals not met: Pt attended one group session. Therapeutic equipment acquired: N/A Reason patient discharged from therapy: Discharge from hospital Pt/family agrees with progress & goals achieved: Yes Date patient discharged from therapy: 10/11/18   Victorino Sparrow, LRT/CTRS  Ria Comment, Zaquan Duffner A 10/11/2018, 11:31 AM

## 2018-10-11 NOTE — Progress Notes (Signed)
Pt did not attend wrap-up group   

## 2018-10-14 ENCOUNTER — Encounter (HOSPITAL_COMMUNITY): Payer: Self-pay | Admitting: *Deleted

## 2018-10-14 ENCOUNTER — Emergency Department (HOSPITAL_COMMUNITY)
Admission: EM | Admit: 2018-10-14 | Discharge: 2018-10-14 | Disposition: A | Discharge status: Home / Self Care | Payer: Medicaid Other | Attending: Emergency Medicine | Admitting: Emergency Medicine

## 2018-10-14 ENCOUNTER — Other Ambulatory Visit: Payer: Self-pay

## 2018-10-14 DIAGNOSIS — F17210 Nicotine dependence, cigarettes, uncomplicated: Secondary | ICD-10-CM | POA: Diagnosis not present

## 2018-10-14 DIAGNOSIS — R070 Pain in throat: Secondary | ICD-10-CM | POA: Diagnosis present

## 2018-10-14 DIAGNOSIS — F333 Major depressive disorder, recurrent, severe with psychotic symptoms: Secondary | ICD-10-CM | POA: Insufficient documentation

## 2018-10-14 DIAGNOSIS — B370 Candidal stomatitis: Secondary | ICD-10-CM

## 2018-10-14 DIAGNOSIS — I10 Essential (primary) hypertension: Secondary | ICD-10-CM | POA: Insufficient documentation

## 2018-10-14 DIAGNOSIS — Z79899 Other long term (current) drug therapy: Secondary | ICD-10-CM | POA: Diagnosis not present

## 2018-10-14 MED ORDER — NYSTATIN 100000 UNIT/ML MT SUSP: 500000.0000 [IU] | Freq: Four times a day (QID) | OROMUCOSAL | 0 refills | Status: DC

## 2018-10-14 NOTE — ED Provider Notes (Signed)
[facility] DEPT Provider Note   CSN: [id number] Arrival date & time: 10/14/18  1903    History   Chief Complaint Chief Complaint  Patient presents with   multiple complaints    HPI Theresa Mccoy is a 38 y.o. female.     Patient is a 38 year old female with a history of depression, bipolar disease, delusions of being pregnant who presents today with several complaints.  She states that she is had 2 days of a sore throat, nasal congestion but no fevers.  She states she has terrible allergies and takes an over-the-counter allergy medication but she is concerned about her throat pain.  She states she gets a lot of sore throats from her allergies.  She has not had cough or shortness of breath.   The history is provided by the patient.    Past Medical History:  Diagnosis Date   Anemia    Depression    Hyperthyroidism    Thyroid disease     Patient Active Problem List   Diagnosis Date Noted   MDD (major depressive disorder), recurrent, severe, with psychosis ([place]) 10/06/2018   Muscle cramps 02/02/2017   Bipolar disorder, current episode mixed, moderate ([place]) 02/02/2017   Generalized abdominal pain 02/02/2017   Generalized anxiety disorder 02/02/2017   Essential hypertension 02/02/2017    Past Surgical History:  Procedure Laterality Date   CESAREAN SECTION     HAND SURGERY Left    SALPINGECTOMY       OB History    Gravida  5   Para  2   Term  2   Preterm      AB  2   Living  2     SAB  1   TAB      Ectopic  1   Multiple      Live Births               Home Medications    Prior to Admission medications   Medication Sig Start Date End Date Taking? Authorizing Provider  albuterol (PROVENTIL HFA;VENTOLIN HFA) 108 (90 Base) MCG/ACT inhaler Inhale 2 puffs into the lungs every 4 (four) hours as needed for wheezing or shortness of breath. Patient not taking: Reported on 04/11/2018 08/07/17   Muthersbaugh,  Jarrett Soho, PA-C  benztropine (COGENTIN) 0.5 MG tablet Take 1 tablet (0.5 mg total) by mouth 2 (two) times daily. 10/11/18   Johnn Hai, MD  ciprofloxacin (CIPRO) 500 MG tablet Take 1 tablet (500 mg total) by mouth 2 (two) times daily. One po bid x 7 days Patient not taking: Reported on 10/06/2018 04/12/18   Veryl Speak, MD  gabapentin (NEURONTIN) 300 MG capsule Take 1 capsule (300 mg total) by mouth 3 (three) times daily. 10/11/18   Johnn Hai, MD  nystatin (MYCOSTATIN) 100000 UNIT/ML suspension Take 5 mLs (500,000 Units total) by mouth 4 (four) times daily. 10/14/18   Blanchie Dessert, MD  Prenatal Vit-Fe Fumarate-FA (PRENATAL MULTIVITAMIN) TABS tablet Take 1 tablet by mouth daily.     [provider]  risperiDONE (RISPERDAL) 2 MG tablet 1 in am 2 at hs 10/11/18   Johnn Hai, MD  risperiDONE microspheres (RISPERDAL CONSTA) 50 MG injection Inject 2 mLs (50 mg total) into the muscle every 21 ( twenty-one) days. Due 4/27 10/11/18   Johnn Hai, MD  Spacer/Aero-Holding Chambers (AEROCHAMBER PLUS WITH MASK) inhaler Use as instructed Patient not taking: Reported on 04/11/2018 08/07/17   Muthersbaugh, Jarrett Soho, PA-C    Family History No  family history on file.  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       Pt states that she is [redacted] weeks pregnant and needs an [place].    Musculoskeletal:       She gets tingling and numbness in the feet which is helped with gabapentin.  She says they are sending her the medicine in the mail soon.  Neurological: Negative for weakness.  All other systems reviewed and are negative.    Physical Exam Updated Vital Signs BP  129/84 (BP Location: Left Arm)    Pulse 88    Temp 98.7 F (37.1 C) (Oral)    Resp 16    Ht 5' (1.524 m)    Wt 68.9 kg    LMP 01/22/2018 (Exact Date)    SpO2 99%    BMI 29.69 kg/m   Physical Exam Vitals signs and nursing note reviewed.  Constitutional:      General: She is not in acute distress.    Appearance: She is well-developed.  HENT:     Head: Normocephalic and atraumatic.     Nose: Mucosal edema and congestion present.     Right Turbinates: Not enlarged.     Left Turbinates: Not enlarged.     Mouth/Throat:     Mouth: Mucous membranes are dry.     Tongue: Lesions present.     Pharynx: Uvula midline. Posterior oropharyngeal erythema present. No oropharyngeal exudate or uvula swelling.     Tonsils: No tonsillar exudate.     Comments: Poor dentition.  White plaques over the tongue with erythematous base.  Erythema of the pharynx without exudate Eyes:     Pupils: Pupils are equal, round, and reactive to light.  Cardiovascular:     Rate and Rhythm: Normal rate and regular rhythm.     Heart sounds: Normal heart sounds. No murmur. No friction rub.  Pulmonary:     Effort: Pulmonary effort is normal.     Breath sounds: Normal breath sounds. No wheezing or rales.  Abdominal:     General: Bowel sounds are normal. There is no distension.     Palpations: Abdomen is soft.     Tenderness: There is no abdominal tenderness. There is no guarding or rebound.  Musculoskeletal: Normal range of motion.        General: No tenderness.     Comments: No edema  Skin:    General: Skin is warm and dry.     Findings: No rash.  Neurological:     Mental Status: She is alert and oriented to person, place, and time.     Cranial Nerves: No cranial nerve deficit.  Psychiatric:        Behavior: Behavior normal.        Thought Content: Thought content is delusional.      ED Treatments / Results  Labs (all labs ordered are listed, but only abnormal results are displayed) Labs Reviewed - No data to  display  EKG None  Radiology No results found.  Procedures Procedures (including critical care time)  Medications Ordered in  ED Medications - No data to display   Initial Impression / Assessment and Plan / ED Course  I have reviewed the triage vital signs and the nursing notes.  Pertinent labs & imaging results that were available during my care of the patient were reviewed by me and considered in my medical decision making (see chart for details).       Patient is a 38 year old female with a history of mental illness and delusion of being pregnant who was recently admitted to behavioral health on 10/06/2018 and discharged on 10/11/2018.  Today she is presenting with multiple complaints.  Initially stating she has had throat pain for the last few days.  She attributes it to her allergies however on exam she appears to have some mild thrush which could also be causing some of the discomfort but also has signs of allergies.  There are no signs today concerning for strep pharyngitis. Patient states that she is not taking any medications right now but spoke with someone on the phone today who will be mailing her her psychiatric medications and gabapentin.  She had lab work done on 10/06/2018 which showed a blood sugar of 119, normal hemoglobin and chronically elevated calcium.  Low suspicion that thrush today is related to diabetes.  Patient states that she is [redacted] weeks pregnant and needs ultrasound.  There is no evidence of pregnancy at this time.  Last hCG done within the week was negative.  Patient was prescribed nystatin mouthwash and discharged home.   Final Clinical Impressions(s) / ED Diagnoses   Final diagnoses:  Thrush, oral    ED Discharge Orders         Ordered    nystatin (MYCOSTATIN) 100000 UNIT/ML suspension  4 times daily     10/14/18 1939           Blanchie Dessert, MD 10/14/18 1950

## 2018-10-14 NOTE — ED Triage Notes (Signed)
Pt seen @ [facility] 10/06/18.  Pt stated "they shipped me to a mental health hospital.  I'm pregnant.  My body doesn't produce hormones, my throat hurts and my legs, they're numb.  I called [facility] back in Feb but they wouldn't give me an appt.  I need an [place] to check the baby.  I'm [redacted] weeks pregnant."

## 2018-10-14 NOTE — Discharge Instructions (Signed)
Continue to take your allergy medication.  Use the liquid and swish and swallow it for the tongue and throat pain.

## 2018-11-01 ENCOUNTER — Other Ambulatory Visit: Payer: Self-pay

## 2018-11-01 ENCOUNTER — Emergency Department (HOSPITAL_COMMUNITY): Payer: Medicaid Other

## 2018-11-01 ENCOUNTER — Emergency Department (HOSPITAL_COMMUNITY)
Admission: EM | Admit: 2018-11-01 | Discharge: 2018-11-01 | Disposition: A | Discharge status: Home / Self Care | Payer: Medicaid Other | Attending: Emergency Medicine | Admitting: Emergency Medicine

## 2018-11-01 ENCOUNTER — Encounter (HOSPITAL_COMMUNITY): Payer: Self-pay | Admitting: Radiology

## 2018-11-01 DIAGNOSIS — R109 Unspecified abdominal pain: Secondary | ICD-10-CM | POA: Diagnosis present

## 2018-11-01 DIAGNOSIS — Z79899 Other long term (current) drug therapy: Secondary | ICD-10-CM | POA: Insufficient documentation

## 2018-11-01 DIAGNOSIS — N10 Acute tubulo-interstitial nephritis: Secondary | ICD-10-CM | POA: Insufficient documentation

## 2018-11-01 DIAGNOSIS — F17210 Nicotine dependence, cigarettes, uncomplicated: Secondary | ICD-10-CM | POA: Insufficient documentation

## 2018-11-01 DIAGNOSIS — I10 Essential (primary) hypertension: Secondary | ICD-10-CM | POA: Insufficient documentation

## 2018-11-01 DIAGNOSIS — N12 Tubulo-interstitial nephritis, not specified as acute or chronic: Secondary | ICD-10-CM

## 2018-11-01 DIAGNOSIS — N3000 Acute cystitis without hematuria: Secondary | ICD-10-CM | POA: Diagnosis not present

## 2018-11-01 LAB — URINALYSIS, ROUTINE W REFLEX MICROSCOPIC
Bilirubin Urine: NEGATIVE
Glucose, UA: NEGATIVE mg/dL
Ketones, ur: NEGATIVE mg/dL
Nitrite: POSITIVE — AB
Protein, ur: NEGATIVE mg/dL
Specific Gravity, Urine: 1.014 (ref 1.005–1.030)
pH: 6 (ref 5.0–8.0)

## 2018-11-01 LAB — CBC WITH DIFFERENTIAL/PLATELET
Abs Immature Granulocytes: 0.01 10*3/uL (ref 0.00–0.07)
Basophils Absolute: 0 10*3/uL (ref 0.0–0.1)
Basophils Relative: 1 %
Eosinophils Absolute: 0.2 10*3/uL (ref 0.0–0.5)
Eosinophils Relative: 3 %
HCT: 37.7 % (ref 36.0–46.0)
Hemoglobin: 12.4 g/dL (ref 12.0–15.0)
Immature Granulocytes: 0 %
Lymphocytes Relative: 37 %
Lymphs Abs: 2.2 10*3/uL (ref 0.7–4.0)
MCH: 32 pg (ref 26.0–34.0)
MCHC: 32.9 g/dL (ref 30.0–36.0)
MCV: 97.4 fL (ref 80.0–100.0)
Monocytes Absolute: 0.6 10*3/uL (ref 0.1–1.0)
Monocytes Relative: 10 %
Neutro Abs: 2.9 10*3/uL (ref 1.7–7.7)
Neutrophils Relative %: 49 %
Platelets: 242 10*3/uL (ref 150–400)
RBC: 3.87 MIL/uL (ref 3.87–5.11)
RDW: 11.9 % (ref 11.5–15.5)
WBC: 5.9 10*3/uL (ref 4.0–10.5)
nRBC: 0 % (ref 0.0–0.2)

## 2018-11-01 LAB — COMPREHENSIVE METABOLIC PANEL
ALT: 16 U/L (ref 0–44)
AST: 14 U/L — ABNORMAL LOW (ref 15–41)
Albumin: 3.7 g/dL (ref 3.5–5.0)
Alkaline Phosphatase: 87 U/L (ref 38–126)
Anion gap: 3 — ABNORMAL LOW (ref 5–15)
BUN: 8 mg/dL (ref 6–20)
CO2: 26 mmol/L (ref 22–32)
Calcium: 11.7 mg/dL — ABNORMAL HIGH (ref 8.9–10.3)
Chloride: 110 mmol/L (ref 98–111)
Creatinine, Ser: 0.82 mg/dL (ref 0.44–1.00)
GFR calc Af Amer: >60 mL/min (ref >60)
GFR calc non Af Amer: >60 mL/min (ref >60)
Glucose, Bld: 97 mg/dL (ref 70–99)
Potassium: 4 mmol/L (ref 3.5–5.1)
Sodium: 139 mmol/L (ref 135–145)
Total Bilirubin: 0.5 mg/dL (ref 0.3–1.2)
Total Protein: 6.6 g/dL (ref 6.5–8.1)

## 2018-11-01 LAB — I-STAT BETA HCG BLOOD, ED (MC, WL, AP ONLY): I-stat hCG, quantitative: <5 m[IU]/mL (ref <5)

## 2018-11-01 LAB — POC URINE PREG, ED: Preg Test, Ur: NEGATIVE

## 2018-11-01 LAB — LIPASE, BLOOD: Lipase: 29 U/L (ref 11–51)

## 2018-11-01 MED ORDER — ACETAMINOPHEN 500 MG PO TABS
1000.0000 mg | ORAL_TABLET | Freq: Once | ORAL | Status: AC
  Administered 2018-11-01: 1000 mg via ORAL
  Filled 2018-11-01: qty 2

## 2018-11-01 MED ORDER — CEPHALEXIN 500 MG PO CAPS: 500.0000 mg | ORAL_CAPSULE | Freq: Four times a day (QID) | ORAL | 0 refills | Status: AC

## 2018-11-01 NOTE — ED Notes (Addendum)
On assessment pt holding stomach, c/o abdominal pain over past 3 weeks, informs this nurse she is "pregnant with 3 babies, but they have not came to the world yet" Pt reports she is [redacted] weeks pregnant.  Denies N/V/D.
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
## 2018-11-01 NOTE — ED Notes (Signed)
Bed: HW80 Expected date:  Expected time:  Means of arrival:  Comments: EMS abd

## 2018-11-01 NOTE — ED Triage Notes (Signed)
Pt brought in by EMS. Per EMS pt reporting generalized abdominal paid and [redacted] weeks pregnant. Pt reporting abdominal pain for 2 weeks.  No N/V/D, no painful urination. Per EMS- roommates at home stating pt is not pregnant and has hx of mental health.

## 2018-11-01 NOTE — Discharge Instructions (Signed)
You were seen in the ED for abdominal and back pain, blood in urine.   You have a bladder and early kidney infection.   Your pregnancy urine and blood tests are negative. CT did not show any babies in your uterus. You are not pregnant.  Take antibiotics as prescribed for 10 days. Tylenol or motrin for pain.   Return for fever greater than 100, vomiting, worsening pain.

## 2018-11-01 NOTE — ED Notes (Signed)
Patient transported to CT scan . 

## 2018-11-01 NOTE — ED Notes (Signed)
Pt d/c home per MD order. Discharge summary reviewed with pt, pt verbalizes understanding. Ambulatory off unit.  

## 2018-11-03 LAB — URINE CULTURE: Culture: >=100000 — AB

## 2018-11-04 ENCOUNTER — Telehealth: Payer: Self-pay | Admitting: *Deleted

## 2018-11-04 IMAGING — US US OB < 14 WEEKS - US OB TV
1 series · 15 of 28 positions shown · non-contrast
Comparison: None.

CLINICAL DATA: Vaginal bleeding since yesterday. Gestational age by
last menstrual period 3 weeks and 6 days. Beta hCG 38.

EXAM:
OBSTETRIC <14 WK US AND TRANSVAGINAL OB US
TECHNIQUE: Both transabdominal and transvaginal ultrasound examinations were
performed for complete evaluation of the gestation as well as the
maternal uterus, adnexal regions, and pelvic cul-de-sac.
Transvaginal technique was performed to assess early pregnancy.

[Series 1: us ob < 14 weeks - us ob tv · 15 of 35 slices shown]
[im 1/35]
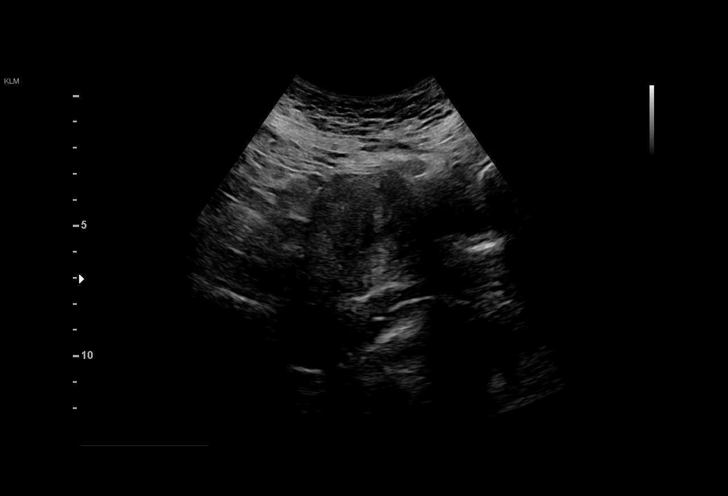
[im 3/35]
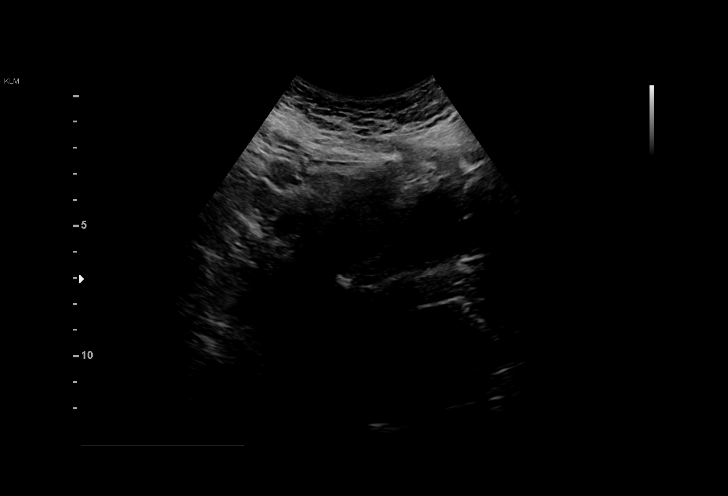
[im 6/35]
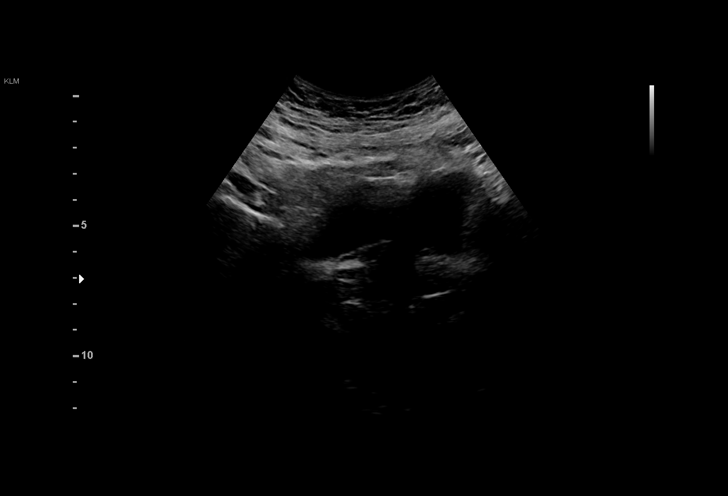
[im 8/35]
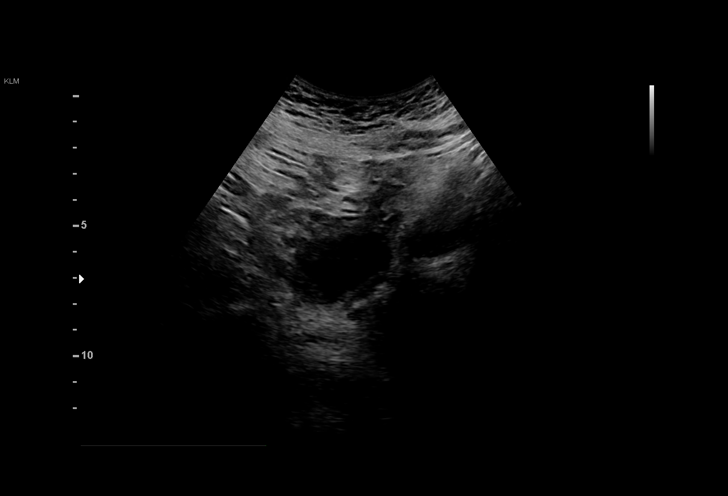
[im 11/35]
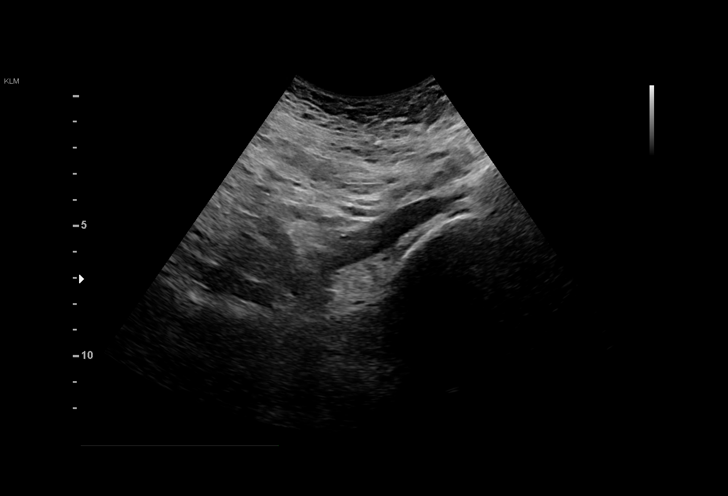
[im 13/35]
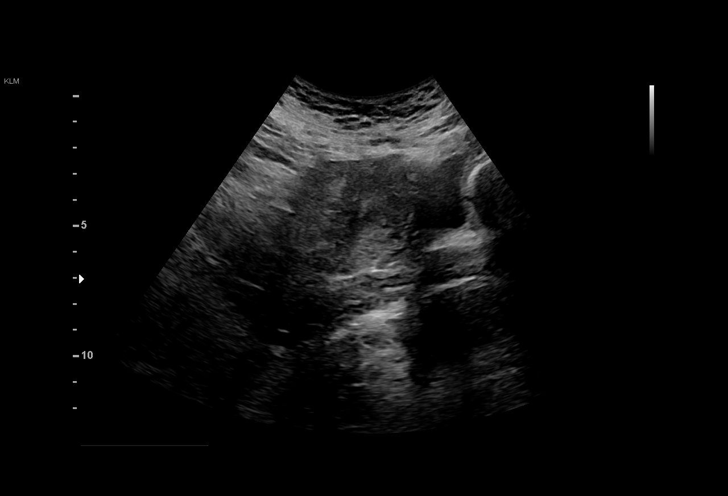
[im 16/35]
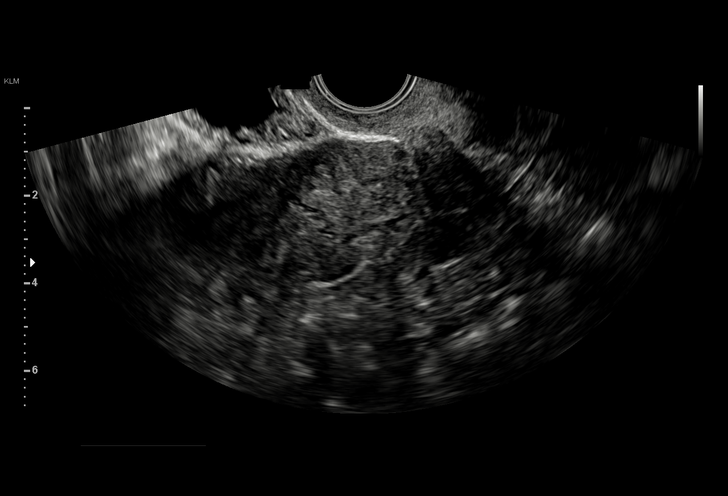
[im 18/35]
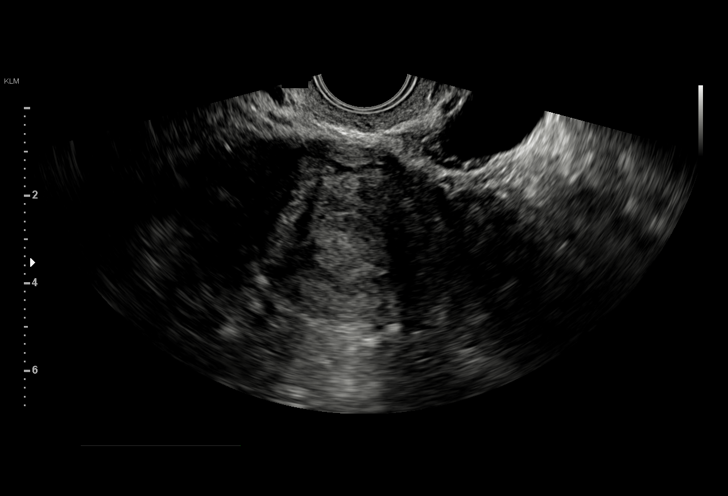
[im 19/35]
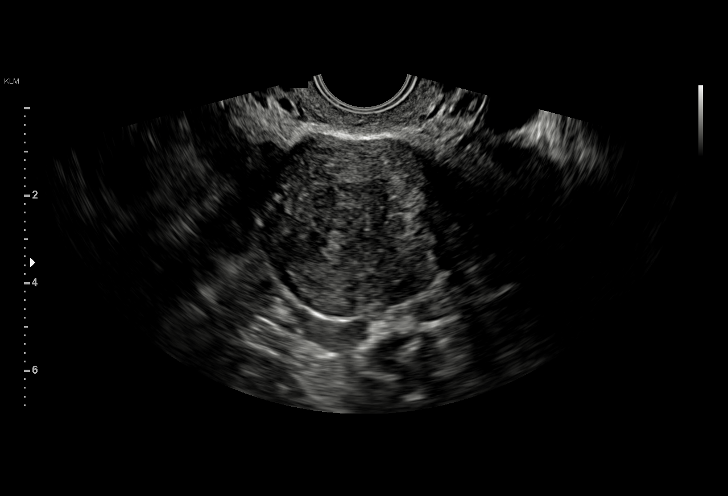
[im 22/35]
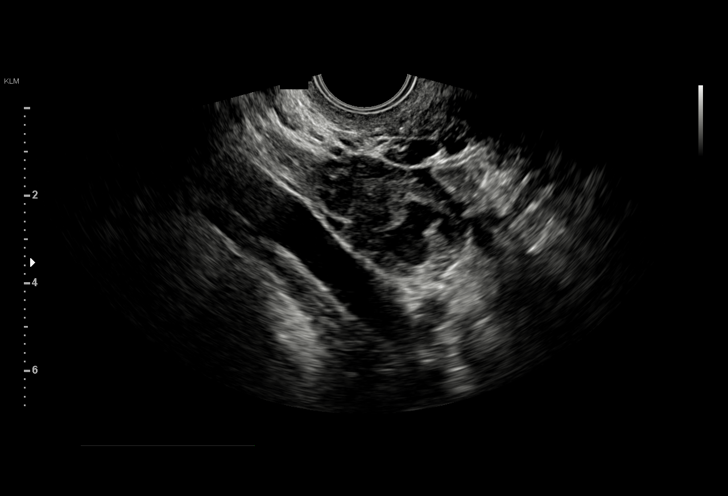
[im 24/35]
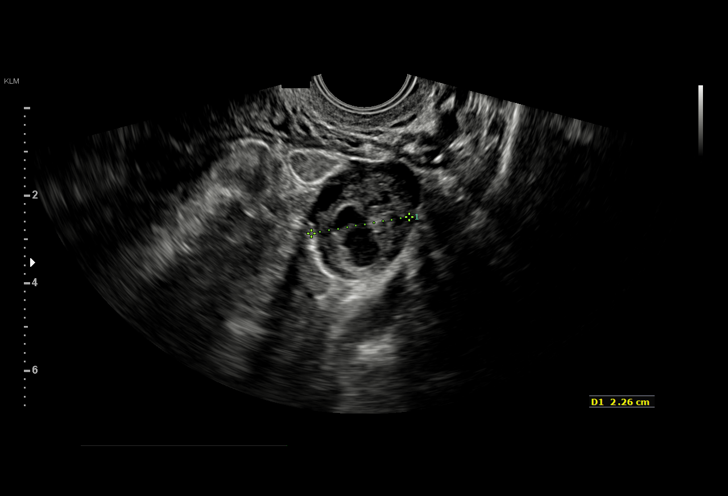
[im 27/35]
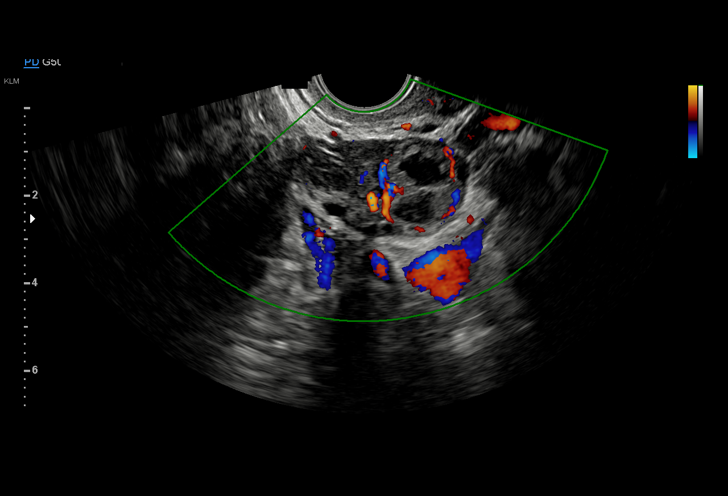
[im 29/35]
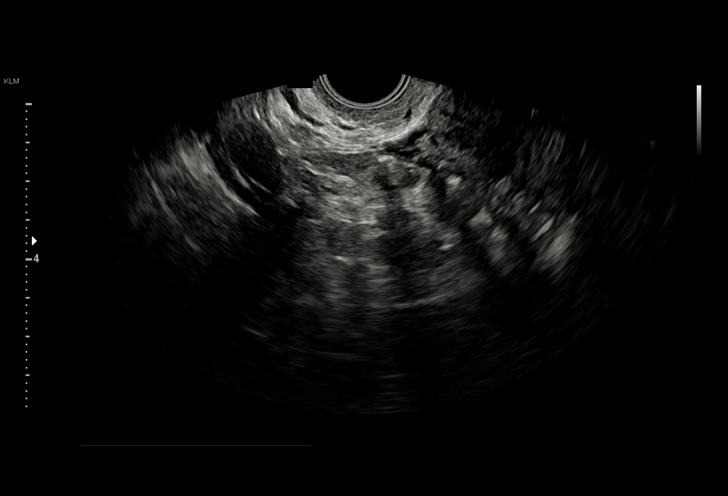
[im 32/35]
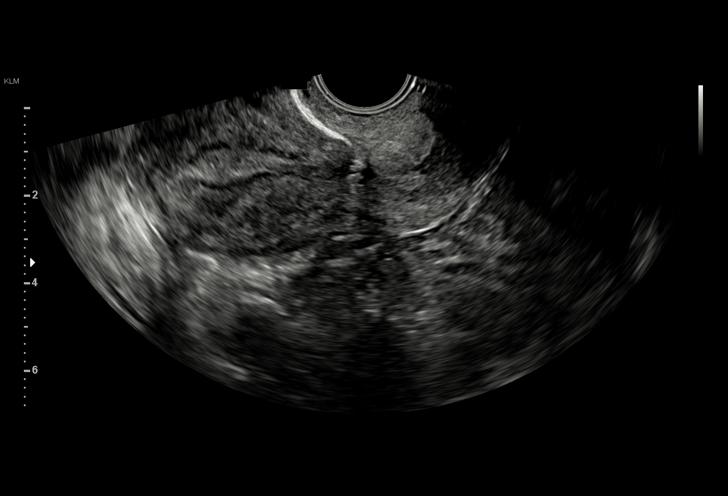
[im 35/35]
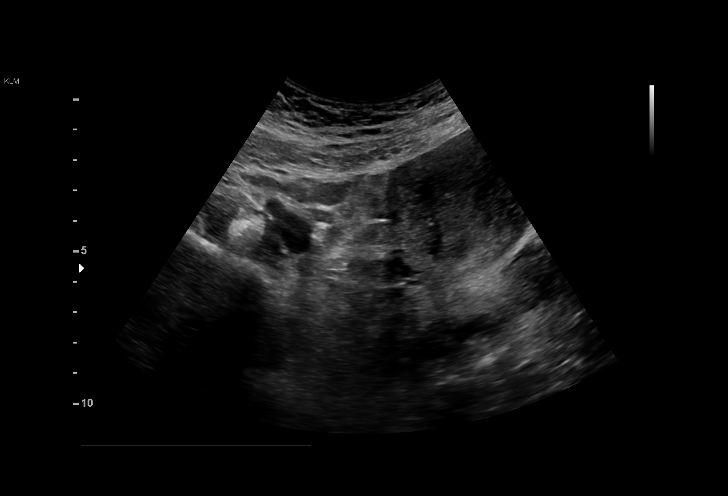

[15 of 28 positions shown; findings below may reference images not displayed]

FINDINGS: Intrauterine gestation not present.  Al sac: Present.

Yolk sac:  Not present.

Embryo:  Not present.

Cardiac Activity: Not applicable.

Subchorionic hemorrhage:  None visualized.

Maternal uterus/adnexae: RIGHT ovary not sonographically identified.
Intra-ovarian LEFT corpus luteal cysts measuring to 16 mm.
IMPRESSION: 1. Pregnancy of unknown anatomic location (no intrauterine
gestational sac or adnexal mass identified, however RIGHT ovary not
sonographically identified). Differential diagnosis includes recent
spontaneous abortion, IUP too early to visualize, and non-visualized
ectopic pregnancy. Recommend correlation with serial beta-hCG
levels, and follow up US if warranted clinically. Please note, with
evident.

## 2018-11-04 NOTE — Telephone Encounter (Signed)
Post ED Visit - Positive Culture Follow-up  Culture report reviewed by antimicrobial stewardship pharmacist: [place] Team []  Elenor Quinones, Pharm.D. []  Heide Guile, Pharm.D., BCPS AQ-ID []  Parks Neptune, Pharm.D., BCPS []  Alycia Rossetti, Pharm.D., BCPS []  [place], [place].D., BCPS, AAHIVP []  Legrand Como, Pharm.D., BCPS, AAHIVP []  Salome Arnt, PharmD, BCPS []  Johnnette Gourd, PharmD, BCPS []  Hughes Better, PharmD, BCPS []  Leeroy Cha, PharmD []  Laqueta Linden, PharmD, BCPS []  Albertina Parr, PharmD  [facility] Team []  Leodis Sias, PharmD []  Lindell Spar, PharmD []  Royetta Asal, PharmD []  Graylin Shiver, Rph []  Rema Fendt) Glennon Mac, PharmD []  Arlyn Dunning, PharmD [x]  Netta Cedars, PharmD []  Dia Sitter, PharmD []  Leone Haven, PharmD []  Gretta Arab, PharmD []  Theodis Shove, PharmD []  Peggyann Juba, PharmD []  Reuel Boom, PharmD   Positive urine culture Treated with Cephalexin, organism sensitive to the same and no further patient follow-up is required at this time.  Harlon Flor [facility] 11/04/2018, 12:35 PM

## 2019-04-11 ENCOUNTER — Ambulatory Visit (INDEPENDENT_AMBULATORY_CARE_PROVIDER_SITE_OTHER): Payer: Medicaid Other | Admitting: *Deleted

## 2019-04-11 ENCOUNTER — Other Ambulatory Visit: Payer: Self-pay

## 2019-04-11 VITALS — BP 115/79 | HR 99 | Temp 98.5°F | Ht 60.0 in | Wt 122.0 lb

## 2019-04-11 DIAGNOSIS — Z3201 Encounter for pregnancy test, result positive: Secondary | ICD-10-CM

## 2019-04-11 DIAGNOSIS — Z3200 Encounter for pregnancy test, result unknown: Secondary | ICD-10-CM

## 2019-04-11 LAB — POCT URINE PREGNANCY: Preg Test, Ur: POSITIVE — AB

## 2019-04-11 NOTE — Progress Notes (Signed)
   Ms. Stairs presents today for UPT. She has no unusual complaints. LMP: 03/01/2019???    OBJECTIVE: Appears well, in no apparent distress.  OB History    Gravida  6   Para  2   Term  2   Preterm      AB  2   Living  2     SAB  1   TAB      Ectopic  1   Multiple      Live Births             Home UPT Result: Positive In-Office UPT result: Positive I have reviewed the patient's medical, obstetrical, social, and family histories, and medications.   ASSESSMENT: Positive pregnancy test  PLAN Prenatal care to be completed at: [facility]  Derl Barrow, RN

## 2019-04-18 ENCOUNTER — Telehealth: Payer: Self-pay | Admitting: General Practice

## 2019-04-18 NOTE — Telephone Encounter (Signed)
Left message for patient to give our office a call back in regards to [place] appt scheduled for 05/16/2019 at 1:30pm.

## 2019-05-03 ENCOUNTER — Encounter: Payer: Self-pay | Admitting: General Practice

## 2019-05-16 ENCOUNTER — Telehealth: Payer: Self-pay | Admitting: General Practice

## 2019-05-16 NOTE — Telephone Encounter (Signed)
Pt was scheduled for [facility] Intake visit via phone.  Due to RN not available, pt is rescheduled for tomorrow, 05/17/2019 at 10:50am.  Left message for pt to give our office a call back to confirm appt.

## 2019-05-17 ENCOUNTER — Ambulatory Visit (INDEPENDENT_AMBULATORY_CARE_PROVIDER_SITE_OTHER): Payer: Medicaid Other | Admitting: *Deleted

## 2019-05-17 ENCOUNTER — Other Ambulatory Visit: Payer: Self-pay

## 2019-05-17 DIAGNOSIS — O0990 Supervision of high risk pregnancy, unspecified, unspecified trimester: Secondary | ICD-10-CM | POA: Insufficient documentation

## 2019-05-17 DIAGNOSIS — O0991 Supervision of high risk pregnancy, unspecified, first trimester: Secondary | ICD-10-CM

## 2019-05-17 MED ORDER — BLOOD PRESSURE MONITOR AUTOMAT DEVI: 1.0000 | Freq: Every day | 0 refills | Status: DC

## 2019-05-17 MED ORDER — PRENATAL MULTIVITAMIN CH: 1.0000 | ORAL_TABLET | Freq: Every day | ORAL | 12 refills | Status: DC

## 2019-05-17 NOTE — Progress Notes (Signed)
    Virtual Visit via Telephone Note  I connected with Theresa Mccoy on 05/17/19 at 10:50 AM EST by telephone and verified that I am speaking with the correct person using two identifiers.  Location: Patient: Theresa Mccoy MRN: [id number] Provider: Derl Barrow, RN   I discussed the limitations, risks, security and privacy concerns of performing an evaluation and management service by telephone and the availability of in person appointments. I also discussed with the patient that there may be a patient responsible charge related to this service. The patient expressed understanding and agreed to proceed.   History of Present Illness: PRENATAL INTAKE SUMMARY  [name] presents today New OB Nurse Interview.  OB History    Gravida  5   Para  2   Term  2   Preterm      AB  2   Living  2     SAB  2   TAB      Ectopic  0   Multiple      Live Births  2          I have reviewed the patient's medical, obstetrical, social, and family histories, medications, and available lab results.  SUBJECTIVE She has no unusual complaints   Observations/Objective: Initial nurse interview for history/labs (New OB)  EDD: 12/06/2019 by [facility] GA: [redacted]w[redacted]d G5P2022 FHT: non face to face interview  GENERAL APPEARANCE: non face to face interview  Assessment and Plan: High Risk Pregnancy to due history of HTN, Hyperthyroidism and mental health issues Prenatal care-[facility] Labs/Physical to be completed at next visit with provider Rx for PNV sent to pharmacy Rx for Blood pressure cuff sent to pharmacy  Follow Up Instructions:   I discussed the assessment and treatment plan with the patient. The patient was provided an opportunity to ask questions and all were answered. The patient agreed with the plan and demonstrated an understanding of the instructions.   The patient was advised to call back or seek an in-person evaluation if the symptoms worsen or if the condition fails to improve  as anticipated.  I provided 20 minutes of non-face-to-face time during this encounter.   Derl Barrow, RN

## 2019-06-09 ENCOUNTER — Other Ambulatory Visit: Payer: Self-pay

## 2019-06-09 ENCOUNTER — Encounter: Payer: Self-pay | Admitting: Obstetrics and Gynecology

## 2019-06-09 ENCOUNTER — Ambulatory Visit (INDEPENDENT_AMBULATORY_CARE_PROVIDER_SITE_OTHER): Payer: Medicaid Other | Admitting: Obstetrics and Gynecology

## 2019-06-09 VITALS — BP 116/81 | HR 101 | Temp 98.4°F | Wt 126.4 lb

## 2019-06-09 DIAGNOSIS — Z362 Encounter for other antenatal screening follow-up: Secondary | ICD-10-CM | POA: Diagnosis not present

## 2019-06-09 DIAGNOSIS — F3162 Bipolar disorder, current episode mixed, moderate: Secondary | ICD-10-CM | POA: Diagnosis not present

## 2019-06-09 DIAGNOSIS — F411 Generalized anxiety disorder: Secondary | ICD-10-CM

## 2019-06-09 DIAGNOSIS — Z3202 Encounter for pregnancy test, result negative: Secondary | ICD-10-CM | POA: Diagnosis not present

## 2019-06-09 LAB — POCT URINE PREGNANCY: Preg Test, Ur: NEGATIVE

## 2019-06-09 NOTE — Progress Notes (Deleted)
poct

## 2019-06-09 NOTE — Progress Notes (Signed)
  GYNECOLOGY PROGRESS NOTE  History:  [name] is a 38 y.o. [id number] presents to [facility] office today for NOB visit. She reports lower back pain and spotting last week, but none this week.  She denies h/a, dizziness, shortness of breath, n/v, or fever/chills.    The following portions of the patient's history were reviewed and updated as appropriate: allergies, current medications, past family history, past medical history, past social history, past surgical history and problem list. Last pap smear in 2018 was normal.  Review of Systems:  Pertinent items are noted in HPI.   Objective:  Physical Exam Blood pressure 116/81, pulse (!) 101, temperature 98.4 F (36.9 C), weight 126 lb 6.4 oz (57.3 kg), last menstrual period 03/01/2019. VS reviewed, nursing note reviewed,  Constitutional: well developed, well nourished, no distress HEENT: normocephalic CV: normal rate Pulm/chest wall: normal effort Breast Exam: deferred Abdomen: soft Neuro: alert and oriented x 3 Skin: warm, dry Psych: affect normal Pelvic exam: Deferred due to inability to auscultate FHTs by RN and CNM  Informal Bedside Ultrasound Patient informed that the ultrasound is considered a limited OB ultrasound and is not intended to be a complete ultrasound exam.  Patient also informed that the ultrasound is not being completed with the intent of assessing for fetal or placental anomalies or any pelvic abnormalities.  Explained that the purpose of today's ultrasound is to assess for viability. NO FETUS visualized on ultrasound.   Assessment & Plan:  1. Bipolar disorder, current episode mixed, moderate ([place]) - Patient trying to get scheduled for [facility] since before she thought she got pregnant -- "unable to get anyone on the phone"  2. Generalized anxiety disorder - Trying to get scheduled with [facility]  3. Pregnancy examination or test, negative result - POCT urine pregnancy - Informed of negative  pregnancy test and nothing seen on ultrasound. Patient very upset stating, "I don't know why this keeps happening! This is the 4th or 5th time this has happened to me. I find out I'm pregnant and when I go to the doctor, I'm not pregnant anymore. I'm done with this! I can't handle this mentally anymore!" - Offered HCG -- declined stating "that is just going to show the same thing, but not tell me why I'm not pregnant when I was before!"  No follow-up appointment scheduled  Laury Deep, CNM 2:39 PM

## 2019-07-01 ENCOUNTER — Inpatient Hospital Stay (HOSPITAL_COMMUNITY)
Admission: AD | Admit: 2019-07-01 | Discharge: 2019-07-18 | DRG: 329 | Disposition: A | Discharge status: Home Health | Payer: Medicaid Other | Attending: Surgery | Admitting: Surgery

## 2019-07-01 ENCOUNTER — Other Ambulatory Visit: Payer: Self-pay

## 2019-07-01 ENCOUNTER — Encounter (HOSPITAL_COMMUNITY): Payer: Self-pay | Admitting: Family Medicine

## 2019-07-01 DIAGNOSIS — O2340 Unspecified infection of urinary tract in pregnancy, unspecified trimester: Secondary | ICD-10-CM

## 2019-07-01 DIAGNOSIS — K651 Peritoneal abscess: Secondary | ICD-10-CM | POA: Diagnosis not present

## 2019-07-01 DIAGNOSIS — N179 Acute kidney failure, unspecified: Secondary | ICD-10-CM | POA: Diagnosis not present

## 2019-07-01 DIAGNOSIS — K658 Other peritonitis: Secondary | ICD-10-CM | POA: Diagnosis not present

## 2019-07-01 DIAGNOSIS — Z8719 Personal history of other diseases of the digestive system: Secondary | ICD-10-CM | POA: Diagnosis present

## 2019-07-01 DIAGNOSIS — E861 Hypovolemia: Secondary | ICD-10-CM | POA: Diagnosis not present

## 2019-07-01 DIAGNOSIS — K55029 Acute infarction of small intestine, extent unspecified: Principal | ICD-10-CM | POA: Diagnosis present

## 2019-07-01 DIAGNOSIS — Z885 Allergy status to narcotic agent status: Secondary | ICD-10-CM

## 2019-07-01 DIAGNOSIS — E039 Hypothyroidism, unspecified: Secondary | ICD-10-CM | POA: Diagnosis present

## 2019-07-01 DIAGNOSIS — F3161 Bipolar disorder, current episode mixed, mild: Secondary | ICD-10-CM | POA: Diagnosis present

## 2019-07-01 DIAGNOSIS — R52 Pain, unspecified: Secondary | ICD-10-CM

## 2019-07-01 DIAGNOSIS — B9620 Unspecified Escherichia coli [E. coli] as the cause of diseases classified elsewhere: Secondary | ICD-10-CM | POA: Diagnosis present

## 2019-07-01 DIAGNOSIS — Z20822 Contact with and (suspected) exposure to covid-19: Secondary | ICD-10-CM | POA: Diagnosis present

## 2019-07-01 DIAGNOSIS — E8809 Other disorders of plasma-protein metabolism, not elsewhere classified: Secondary | ICD-10-CM | POA: Diagnosis not present

## 2019-07-01 DIAGNOSIS — N390 Urinary tract infection, site not specified: Secondary | ICD-10-CM

## 2019-07-01 DIAGNOSIS — K56609 Unspecified intestinal obstruction, unspecified as to partial versus complete obstruction: Secondary | ICD-10-CM

## 2019-07-01 DIAGNOSIS — B360 Pityriasis versicolor: Secondary | ICD-10-CM | POA: Diagnosis present

## 2019-07-01 DIAGNOSIS — E876 Hypokalemia: Secondary | ICD-10-CM | POA: Diagnosis not present

## 2019-07-01 DIAGNOSIS — N3001 Acute cystitis with hematuria: Secondary | ICD-10-CM

## 2019-07-01 DIAGNOSIS — Z789 Other specified health status: Secondary | ICD-10-CM

## 2019-07-01 DIAGNOSIS — Z0189 Encounter for other specified special examinations: Secondary | ICD-10-CM

## 2019-07-01 DIAGNOSIS — F4310 Post-traumatic stress disorder, unspecified: Secondary | ICD-10-CM | POA: Diagnosis present

## 2019-07-01 DIAGNOSIS — F3162 Bipolar disorder, current episode mixed, moderate: Secondary | ICD-10-CM | POA: Diagnosis present

## 2019-07-01 DIAGNOSIS — R109 Unspecified abdominal pain: Secondary | ICD-10-CM

## 2019-07-01 DIAGNOSIS — N739 Female pelvic inflammatory disease, unspecified: Secondary | ICD-10-CM

## 2019-07-01 DIAGNOSIS — F17210 Nicotine dependence, cigarettes, uncomplicated: Secondary | ICD-10-CM | POA: Diagnosis present

## 2019-07-01 DIAGNOSIS — D351 Benign neoplasm of parathyroid gland: Secondary | ICD-10-CM | POA: Diagnosis present

## 2019-07-01 DIAGNOSIS — I10 Essential (primary) hypertension: Secondary | ICD-10-CM | POA: Diagnosis present

## 2019-07-01 DIAGNOSIS — D62 Acute posthemorrhagic anemia: Secondary | ICD-10-CM | POA: Diagnosis not present

## 2019-07-01 DIAGNOSIS — D638 Anemia in other chronic diseases classified elsewhere: Secondary | ICD-10-CM | POA: Diagnosis present

## 2019-07-01 DIAGNOSIS — E210 Primary hyperparathyroidism: Secondary | ICD-10-CM | POA: Diagnosis present

## 2019-07-01 DIAGNOSIS — K631 Perforation of intestine (nontraumatic): Secondary | ICD-10-CM | POA: Diagnosis not present

## 2019-07-01 DIAGNOSIS — Z4659 Encounter for fitting and adjustment of other gastrointestinal appliance and device: Secondary | ICD-10-CM

## 2019-07-01 DIAGNOSIS — K55021 Focal (segmental) acute infarction of small intestine: Secondary | ICD-10-CM | POA: Diagnosis not present

## 2019-07-01 LAB — URINALYSIS, ROUTINE W REFLEX MICROSCOPIC
Bilirubin Urine: NEGATIVE
Glucose, UA: NEGATIVE mg/dL
Hgb urine dipstick: NEGATIVE
Ketones, ur: 20 mg/dL — AB
Nitrite: NEGATIVE
Protein, ur: 100 mg/dL — AB
Specific Gravity, Urine: 1.019 (ref 1.005–1.030)
WBC, UA: >50 WBC/hpf — ABNORMAL HIGH (ref 0–5)
pH: 7 (ref 5.0–8.0)

## 2019-07-01 LAB — COMPREHENSIVE METABOLIC PANEL
ALT: 19 U/L (ref 0–44)
AST: 21 U/L (ref 15–41)
Albumin: 4.4 g/dL (ref 3.5–5.0)
Alkaline Phosphatase: 97 U/L (ref 38–126)
Anion gap: 11 (ref 5–15)
BUN: 13 mg/dL (ref 6–20)
CO2: 22 mmol/L (ref 22–32)
Calcium: 12.6 mg/dL — ABNORMAL HIGH (ref 8.9–10.3)
Chloride: 107 mmol/L (ref 98–111)
Creatinine, Ser: 0.91 mg/dL (ref 0.44–1.00)
GFR calc Af Amer: >60 mL/min (ref >60)
GFR calc non Af Amer: >60 mL/min (ref >60)
Glucose, Bld: 175 mg/dL — ABNORMAL HIGH (ref 70–99)
Potassium: 4 mmol/L (ref 3.5–5.1)
Sodium: 140 mmol/L (ref 135–145)
Total Bilirubin: 0.4 mg/dL (ref 0.3–1.2)
Total Protein: 7.7 g/dL (ref 6.5–8.1)

## 2019-07-01 LAB — CBC
HCT: 49 % — ABNORMAL HIGH (ref 36.0–46.0)
Hemoglobin: 16 g/dL — ABNORMAL HIGH (ref 12.0–15.0)
MCH: 31.1 pg (ref 26.0–34.0)
MCHC: 32.7 g/dL (ref 30.0–36.0)
MCV: 95.3 fL (ref 80.0–100.0)
Platelets: 344 10*3/uL (ref 150–400)
RBC: 5.14 MIL/uL — ABNORMAL HIGH (ref 3.87–5.11)
RDW: 11.9 % (ref 11.5–15.5)
WBC: 17.2 10*3/uL — ABNORMAL HIGH (ref 4.0–10.5)
nRBC: 0 % (ref 0.0–0.2)

## 2019-07-01 LAB — I-STAT BETA HCG BLOOD, ED (MC, WL, AP ONLY): I-stat hCG, quantitative: <5 m[IU]/mL (ref <5)

## 2019-07-01 LAB — POCT PREGNANCY, URINE: Preg Test, Ur: NEGATIVE

## 2019-07-01 LAB — LIPASE, BLOOD: Lipase: 20 U/L (ref 11–51)

## 2019-07-01 MED ORDER — SODIUM CHLORIDE 0.9% FLUSH
3.0000 mL | Freq: Once | INTRAVENOUS | Status: AC
  Administered 2019-07-02: 09:00:00 3 mL via INTRAVENOUS

## 2019-07-01 NOTE — ED Triage Notes (Signed)
Pt was sent from [facility] after full work up bc of abdominal pain and bleeding. Pt is not pregnant. Pt said still hurting.

## 2019-07-01 NOTE — Progress Notes (Signed)
First Provider Initiated Contact with Patient 07/01/19 2030      S Theresa Mccoy is a 38 y.o. [phone] non-pregnant female who presents to [facility]U today with complaint of abdominal pain and light pink bleeding.   O BP 134/88 (BP Location: Right Arm)   Pulse 92   Temp 98.2 F (36.8 C) (Oral)   Resp 18   LMP 03/01/2019 (Approximate)   SpO2 99%  Physical Exam  Nursing note and vitals reviewed. Constitutional: She appears well-developed and well-nourished.  HENT:  Head: Normocephalic and atraumatic.  Eyes: Pupils are equal, round, and reactive to light. Conjunctivae are normal.  Cardiovascular: Normal rate, regular rhythm, normal heart sounds and intact distal pulses.  Respiratory: Effort normal and breath sounds normal.  GI: Soft. She exhibits no mass. There is abdominal tenderness. There is no rebound and no guarding.  Musculoskeletal:        General: Normal range of motion.     Cervical back: Normal range of motion and neck supple.  Neurological: She is alert.  Psychiatric:  Speech mumbled and affect depressed    A Non pregnant female Medical screening exam complete  P Discharge from MAU in stable condition, transferred to [facility] for further evaluation, report called to ED provider Patient may return to [facility]U as needed for pregnancy related complaints  Conny Moening L, DO 07/01/2019 8:38 PM

## 2019-07-01 NOTE — MAU Note (Signed)
Patient reports to MAU c/o abdominal pain all over. Pt reports this pain started today. Pt reports light pink bleeding 3days ago but none currently. Patient reports nerve pain all over her body.

## 2019-07-01 NOTE — MAU Note (Signed)
[facility] charge nurse notified of patient coming.

## 2019-07-02 ENCOUNTER — Inpatient Hospital Stay (HOSPITAL_COMMUNITY): Payer: Medicaid Other

## 2019-07-02 ENCOUNTER — Observation Stay (HOSPITAL_COMMUNITY): Payer: Medicaid Other

## 2019-07-02 DIAGNOSIS — F3161 Bipolar disorder, current episode mixed, mild: Secondary | ICD-10-CM | POA: Diagnosis present

## 2019-07-02 DIAGNOSIS — B360 Pityriasis versicolor: Secondary | ICD-10-CM | POA: Diagnosis present

## 2019-07-02 DIAGNOSIS — E861 Hypovolemia: Secondary | ICD-10-CM | POA: Diagnosis not present

## 2019-07-02 DIAGNOSIS — N390 Urinary tract infection, site not specified: Secondary | ICD-10-CM | POA: Diagnosis not present

## 2019-07-02 DIAGNOSIS — K565 Intestinal adhesions [bands], unspecified as to partial versus complete obstruction: Secondary | ICD-10-CM | POA: Diagnosis not present

## 2019-07-02 DIAGNOSIS — D62 Acute posthemorrhagic anemia: Secondary | ICD-10-CM | POA: Diagnosis not present

## 2019-07-02 DIAGNOSIS — N179 Acute kidney failure, unspecified: Secondary | ICD-10-CM | POA: Diagnosis not present

## 2019-07-02 DIAGNOSIS — F329 Major depressive disorder, single episode, unspecified: Secondary | ICD-10-CM

## 2019-07-02 DIAGNOSIS — K56609 Unspecified intestinal obstruction, unspecified as to partial versus complete obstruction: Secondary | ICD-10-CM | POA: Diagnosis present

## 2019-07-02 DIAGNOSIS — N3001 Acute cystitis with hematuria: Secondary | ICD-10-CM | POA: Diagnosis present

## 2019-07-02 DIAGNOSIS — K631 Perforation of intestine (nontraumatic): Secondary | ICD-10-CM | POA: Diagnosis not present

## 2019-07-02 DIAGNOSIS — D351 Benign neoplasm of parathyroid gland: Secondary | ICD-10-CM | POA: Diagnosis present

## 2019-07-02 DIAGNOSIS — Z87440 Personal history of urinary (tract) infections: Secondary | ICD-10-CM

## 2019-07-02 DIAGNOSIS — F419 Anxiety disorder, unspecified: Secondary | ICD-10-CM | POA: Diagnosis not present

## 2019-07-02 DIAGNOSIS — D649 Anemia, unspecified: Secondary | ICD-10-CM | POA: Diagnosis not present

## 2019-07-02 DIAGNOSIS — K55029 Acute infarction of small intestine, extent unspecified: Secondary | ICD-10-CM | POA: Diagnosis present

## 2019-07-02 DIAGNOSIS — F3162 Bipolar disorder, current episode mixed, moderate: Secondary | ICD-10-CM | POA: Diagnosis not present

## 2019-07-02 DIAGNOSIS — R109 Unspecified abdominal pain: Secondary | ICD-10-CM | POA: Diagnosis not present

## 2019-07-02 DIAGNOSIS — Z885 Allergy status to narcotic agent status: Secondary | ICD-10-CM | POA: Diagnosis not present

## 2019-07-02 DIAGNOSIS — E876 Hypokalemia: Secondary | ICD-10-CM | POA: Diagnosis not present

## 2019-07-02 DIAGNOSIS — B9620 Unspecified Escherichia coli [E. coli] as the cause of diseases classified elsewhere: Secondary | ICD-10-CM | POA: Diagnosis present

## 2019-07-02 DIAGNOSIS — I10 Essential (primary) hypertension: Secondary | ICD-10-CM | POA: Diagnosis present

## 2019-07-02 DIAGNOSIS — E039 Hypothyroidism, unspecified: Secondary | ICD-10-CM | POA: Diagnosis present

## 2019-07-02 DIAGNOSIS — E210 Primary hyperparathyroidism: Secondary | ICD-10-CM | POA: Diagnosis present

## 2019-07-02 DIAGNOSIS — E8809 Other disorders of plasma-protein metabolism, not elsewhere classified: Secondary | ICD-10-CM | POA: Diagnosis not present

## 2019-07-02 DIAGNOSIS — F4310 Post-traumatic stress disorder, unspecified: Secondary | ICD-10-CM | POA: Diagnosis present

## 2019-07-02 DIAGNOSIS — Z8719 Personal history of other diseases of the digestive system: Secondary | ICD-10-CM | POA: Diagnosis present

## 2019-07-02 DIAGNOSIS — D638 Anemia in other chronic diseases classified elsewhere: Secondary | ICD-10-CM | POA: Diagnosis present

## 2019-07-02 DIAGNOSIS — Z20822 Contact with and (suspected) exposure to covid-19: Secondary | ICD-10-CM | POA: Diagnosis present

## 2019-07-02 DIAGNOSIS — F17210 Nicotine dependence, cigarettes, uncomplicated: Secondary | ICD-10-CM | POA: Diagnosis present

## 2019-07-02 DIAGNOSIS — O2340 Unspecified infection of urinary tract in pregnancy, unspecified trimester: Secondary | ICD-10-CM

## 2019-07-02 DIAGNOSIS — K658 Other peritonitis: Secondary | ICD-10-CM | POA: Diagnosis not present

## 2019-07-02 DIAGNOSIS — R1084 Generalized abdominal pain: Secondary | ICD-10-CM | POA: Diagnosis present

## 2019-07-02 DIAGNOSIS — K55021 Focal (segmental) acute infarction of small intestine: Secondary | ICD-10-CM | POA: Diagnosis not present

## 2019-07-02 DIAGNOSIS — K651 Peritoneal abscess: Secondary | ICD-10-CM | POA: Diagnosis not present

## 2019-07-02 LAB — CBC
HCT: 44 % (ref 36.0–46.0)
Hemoglobin: 14.8 g/dL (ref 12.0–15.0)
MCH: 31.2 pg (ref 26.0–34.0)
MCHC: 33.6 g/dL (ref 30.0–36.0)
MCV: 92.8 fL (ref 80.0–100.0)
Platelets: 245 10*3/uL (ref 150–400)
RBC: 4.74 MIL/uL (ref 3.87–5.11)
RDW: 12.3 % (ref 11.5–15.5)
WBC: 23.5 10*3/uL — ABNORMAL HIGH (ref 4.0–10.5)
nRBC: 0 % (ref 0.0–0.2)

## 2019-07-02 LAB — CREATININE, SERUM
Creatinine, Ser: 0.86 mg/dL (ref 0.44–1.00)
GFR calc Af Amer: >60 mL/min (ref >60)
GFR calc non Af Amer: >60 mL/min (ref >60)

## 2019-07-02 LAB — HIV ANTIBODY (ROUTINE TESTING W REFLEX): HIV Screen 4th Generation wRfx: NONREACTIVE

## 2019-07-02 LAB — POC SARS CORONAVIRUS 2 AG -  ED: SARS Coronavirus 2 Ag: NEGATIVE

## 2019-07-02 MED ORDER — SODIUM CHLORIDE 0.9 % IV BOLUS
1000.0000 mL | Freq: Once | INTRAVENOUS | Status: AC
  Administered 2019-07-02: 09:00:00 1000 mL via INTRAVENOUS

## 2019-07-02 MED ORDER — SODIUM CHLORIDE 0.9 % IV SOLN
1.0000 g | Freq: Once | INTRAVENOUS | Status: AC
  Administered 2019-07-02: 09:00:00 1 g via INTRAVENOUS
  Filled 2019-07-02: qty 10

## 2019-07-02 MED ORDER — ONDANSETRON HCL 4 MG/2ML IJ SOLN
4.0000 mg | Freq: Four times a day (QID) | INTRAMUSCULAR | Status: DC | PRN
  Administered 2019-07-04 – 2019-07-06 (×2): 4 mg via INTRAVENOUS
  Filled 2019-07-02 (×2): qty 2

## 2019-07-02 MED ORDER — FENTANYL CITRATE (PF) 100 MCG/2ML IJ SOLN
50.0000 ug | Freq: Once | INTRAMUSCULAR | Status: AC
  Administered 2019-07-02: 09:00:00 50 ug via INTRAVENOUS
  Filled 2019-07-02: qty 2

## 2019-07-02 MED ORDER — SODIUM CHLORIDE 0.9 % IV SOLN
1.0000 g | Freq: Once | INTRAVENOUS | Status: AC
  Administered 2019-07-03: 09:00:00 1 g via INTRAVENOUS
  Filled 2019-07-02: qty 1

## 2019-07-02 MED ORDER — KETOROLAC TROMETHAMINE 30 MG/ML IJ SOLN
30.0000 mg | Freq: Once | INTRAMUSCULAR | Status: AC
  Administered 2019-07-02: 09:00:00 30 mg via INTRAVENOUS
  Filled 2019-07-02: qty 1

## 2019-07-02 MED ORDER — SODIUM CHLORIDE 0.9% FLUSH
3.0000 mL | Freq: Two times a day (BID) | INTRAVENOUS | Status: DC
  Administered 2019-07-06 – 2019-07-09 (×2): 3 mL via INTRAVENOUS

## 2019-07-02 MED ORDER — DIATRIZOATE MEGLUMINE & SODIUM 66-10 % PO SOLN
90.0000 mL | Freq: Once | ORAL | Status: AC
  Administered 2019-07-02: 90 mL via NASOGASTRIC
  Filled 2019-07-02: qty 90

## 2019-07-02 MED ORDER — MORPHINE SULFATE (PF) 4 MG/ML IV SOLN
4.0000 mg | Freq: Once | INTRAVENOUS | Status: AC
  Administered 2019-07-02: 11:00:00 4 mg via INTRAVENOUS
  Filled 2019-07-02: qty 1

## 2019-07-02 MED ORDER — ONDANSETRON HCL 4 MG/2ML IJ SOLN
4.0000 mg | Freq: Once | INTRAMUSCULAR | Status: AC
  Administered 2019-07-02: 4 mg via INTRAVENOUS
  Filled 2019-07-02: qty 2

## 2019-07-02 MED ORDER — KETOROLAC TROMETHAMINE 15 MG/ML IJ SOLN
15.0000 mg | Freq: Four times a day (QID) | INTRAMUSCULAR | Status: AC | PRN
  Administered 2019-07-02 – 2019-07-07 (×9): 15 mg via INTRAVENOUS
  Filled 2019-07-02 (×9): qty 1

## 2019-07-02 MED ORDER — ACETAMINOPHEN 650 MG RE SUPP: 650.0000 mg | Freq: Four times a day (QID) | RECTAL | Status: DC | PRN

## 2019-07-02 MED ORDER — SODIUM CHLORIDE 0.9 % IV BOLUS
1000.0000 mL | Freq: Once | INTRAVENOUS | Status: AC
  Administered 2019-07-02: 12:00:00 1000 mL via INTRAVENOUS

## 2019-07-02 MED ORDER — ENOXAPARIN SODIUM 40 MG/0.4ML ~~LOC~~ SOLN
40.0000 mg | SUBCUTANEOUS | Status: DC
  Administered 2019-07-02: 18:00:00 40 mg via SUBCUTANEOUS
  Filled 2019-07-02: qty 0.4

## 2019-07-02 MED ORDER — ACETAMINOPHEN 325 MG PO TABS
650.0000 mg | ORAL_TABLET | Freq: Four times a day (QID) | ORAL | Status: DC | PRN
  Administered 2019-07-02 – 2019-07-03 (×2): 650 mg via ORAL
  Filled 2019-07-02 (×2): qty 2

## 2019-07-02 MED ORDER — SODIUM CHLORIDE 0.9 % IV SOLN: INTRAVENOUS | Status: DC

## 2019-07-02 NOTE — Plan of Care (Signed)

## 2019-07-02 NOTE — H&P (Signed)
Date: 07/02/2019               Patient Name:  Theresa Mccoy MRN: [id number]  DOB: 1980-07-15 Age / Sex: 38 y.o., female   PCP: Kerin Perna, NP         Medical Service: Internal Medicine Teaching Service         Attending Physician: Dr. Gareth Morgan, MD    First Contact: Dr. Benjamine Mola Pager: [phone]  Second Contact: Dr. Myrtie Hawk Pager: [phone]       After Hours (After 5p/  First Contact Pager: [phone]  weekends / holidays): Second Contact Pager: [phone]   Chief Complaint: Abdominal pain  History of Present Illness: Patient is a 38 year old female with past medical history of anemia, depression, hypothyroidism, PTSD who presented for evaluation of abdominal pain which patient states started yesterday.  Patient states she was in her normal state of health prior to yesterday.  Patient reports that pain was a constant sharp pain all throughout her abdomen that was worse with movement.  Patient states that she had 3 episodes of nonbloody, nonbilious emesis yesterday.  Reports having had 2 C-sections, denies other surgical history.  Patient reports she had a large, solid, brown, nonbloody bowel movement yesterday.  Patient denies fever, chest pain, shortness of breath, swelling, changes in weight.  Patient reports that she thought she was pregnant, went to the [facility] but was transferred to this emergency department following negative pregnancy test. Patient reports that she had some pain with urination today but none prior, reports a history of urinary incontinence which started following birth of last child.  Meds:  Current Meds  Medication Sig  . gabapentin (NEURONTIN) 300 MG capsule Take 1 capsule (300 mg total) by mouth 3 (three) times daily.  . Prenatal Vit-Fe Fumarate-FA (PRENATAL MULTIVITAMIN) TABS tablet Take 1 tablet by mouth daily.  . risperiDONE (RISPERDAL) 2 MG tablet 1 in am 2 at hs (Patient taking differently: 2-4 mg 2 (two) times daily. 2mg  in am and 4mg   at hs)  . risperiDONE microspheres (RISPERDAL CONSTA) 50 MG injection Inject 2 mLs (50 mg total) into the muscle every 21 ( twenty-one) days. Due 4/27    Allergies: Allergies as of 07/01/2019 - Review Complete 07/01/2019  Allergen Reaction Noted  . Codeine Other (See Comments) 07/20/2015   Past Medical History:  Diagnosis Date  . Anemia   . Depression   . Hyperthyroidism   . PTSD (post-traumatic stress disorder)   . Thyroid disease    Family History: Family history unable to be obtained due to mentation  Social History:  * Denies current alcohol usage * Reports smoking 4 cigarettes per day for past 15 years * Reports marijuana usage, denies other recreational drug usage * Patient lives in [place] * Patient has PCP  Review of Systems: A complete ROS was negative except as per HPI.   Physical Exam: Blood pressure (!) 139/98, pulse (!) 116, temperature 98 F (36.7 C), temperature source Oral, resp. rate 16, SpO2 100 %, unknown if currently breastfeeding. Physical Exam  Constitutional: She is well-developed, well-nourished, and in no distress.  HENT:  Head: Normocephalic and atraumatic.  Eyes: EOM are normal. Right eye exhibits no discharge. Left eye exhibits no discharge.  Neck: No tracheal deviation present.  Cardiovascular: Normal rate and regular rhythm. Exam reveals no gallop and no friction rub.  No murmur heard. Pulmonary/Chest: Effort normal and breath sounds normal. No respiratory distress. She has no wheezes. She has no  rales.  Abdominal: Soft. She exhibits no distension. There is abdominal tenderness (Diffusely tender to light palpation). There is no rebound and no guarding.  Bowel sounds absent  Musculoskeletal:        General: No tenderness, deformity or edema. Normal range of motion.     Cervical back: Normal range of motion.  Neurological: She is alert. Coordination normal.  Skin: Skin is warm and dry. No rash noted. She is not diaphoretic. No erythema.    Psychiatric: Memory and judgment normal.   CBC Latest Ref Rng & Units 07/01/2019 11/01/2018 10/06/2018  WBC 4.0 - 10.5 K/uL 17.2(H) 5.9 9.7  Hemoglobin 12.0 - 15.0 g/dL 16.0(H) 12.4 13.5  Hematocrit 36.0 - 46.0 % 49.0(H) 37.7 40.1  Platelets 150 - 400 K/uL 344 242 231   BMP Latest Ref Rng & Units 07/01/2019 11/01/2018 10/06/2018  Glucose 70 - 99 mg/dL 175(H) 97 119(H)  BUN 6 - 20 mg/dL 13 8 9   Creatinine 0.44 - 1.00 mg/dL 0.91 0.82 0.82  BUN/Creat Ratio 9 - 23 - - -  Sodium 135 - 145 mmol/L 140 139 138  Potassium 3.5 - 5.1 mmol/L 4.0 4.0 3.7  Chloride 98 - 111 mmol/L 107 110 110  CO2 22 - 32 mmol/L 22 26 22   Calcium 8.9 - 10.3 mg/dL 12.6(H) 11.7(H) 11.8(H)    EKG: personally reviewed my interpretation is not performed  CXR: personally reviewed my interpretation is not performed  CT Renal Stone Study (07/02/2019): IMPRESSION: Multiple dilated small bowel loops in the lower abdomen and pelvis. The findings are consistent with small bowel obstruction.  Nonobstructing stone identified in the right kidney.  The patella is slightly superiorly located relative to the femoral tibial joint raising the question of superior subluxation.  Assessment & Plan by Problem: Active Problems:   Small bowel obstruction (HCC)   Urinary tract infection  Patient is a 38 year old female with past medical history of anemia, depression, hypothyroidism, PTSD who presented with one day history of abdominal pain and was found to have small bowel obstruction and a urinary tract infection.  # Small bowel obstruction: Patient with 1 day history of abdominal pain, nausea, vomiting.  Patient was evaluated by general surgery and assesses that obstruction is likely adhesive in origin given location in pelvis in setting of C-sections and possible salpingectomy. * NPO * Has received 1 L NS bolus (x2), now on 100 ml/hr NS maintenance fluids * NG tubed placed with low intermittent suction * Tylenol 650 mg Q6HR PRN  for mild pain/fever + Toradol 15 mg Q6HR PRN for severe pain * Zofran 4 mg Q6HR PRN for nausea. EKG ordered to evaluate QTc * General surgery following, we appreciate their recommendations  # Urinary tract infection: Urinalysis reveals moderate leukocytes, > 50 WBC, many bacteria and patient reports pain with urination today. Patient is afebrile, elevated white count to 17.2. Elevation may be partially secondary to dehydration. * Will continue ceftriaxone 1 g daily * Will order add-on urine culture  Diet: NPO DVT Ppx: Lovenox 40 mg daily Dispo: Admit patient to inpatient with expected length of stay greater than 2 midnights.  Signed: Jeanmarie Hubert, MD 07/02/2019, 12:40 PM  Pager: [phone]

## 2019-07-02 NOTE — Progress Notes (Addendum)
Note created in error.

## 2019-07-02 NOTE — ED Notes (Signed)
Pt transported to CT ?

## 2019-07-02 NOTE — ED Notes (Addendum)
Pt moved to Rm 54 - monitor applied. Portable x-ray being performed for NG Tube Placement.

## 2019-07-02 NOTE — ED Provider Notes (Addendum)
[facility] EMERGENCY DEPARTMENT Provider Note   CSN: [id number] Arrival date & time: 07/01/19  1956     History Chief Complaint  Patient presents with  . Abdominal Pain    Theresa Mccoy is a 38 y.o. female past medical history of anemia, depression, hypothyroidism, PTSD, delusional pregnancy who presents for evaluation of abdominal pain that has been ongoing for last 3 days.  Patient reports that she has also had some light pink bleeding that is mostly when she wipes after urination. She has not had to use a pad. Patient states that the abdomen hurts all over but occasionally will be worse in the lower abdomen.  Patient states that she has had some vomiting at home but no vomiting since being here.  She has not noted any fever.  She is not noted any diarrhea.  Her last bowel movement was about 2 days ago. She does not think she has been passing gas. She states that she went to [facility] hospital initially because she thought she was pregnant.  They did initial assessment and she was transferred over to the emergency department for further evaluation. Patient states that she still thinks she is pregnant and does not believe the negative pregnancy test. She states that this happened with her last pregnancy, it did not always show up on a pregnancy test either. She denies any chest pain, difficulty breathing.  The history is provided by the patient.       Past Medical History:  Diagnosis Date  . Anemia   . Depression   . Hyperthyroidism   . PTSD (post-traumatic stress disorder)   . Thyroid disease     Patient Active Problem List   Diagnosis Date Noted  . Supervision of high risk pregnancy, antepartum 05/17/2019  . MDD (major depressive disorder), recurrent, severe, with psychosis ([place]) 10/06/2018  . Muscle cramps 02/02/2017  . Bipolar disorder, current episode mixed, moderate ([place]) 02/02/2017  . Generalized abdominal pain 02/02/2017  . Generalized anxiety disorder  02/02/2017  . Essential hypertension 02/02/2017    Past Surgical History:  Procedure Laterality Date  . CESAREAN SECTION    . DILATION AND CURETTAGE OF UTERUS    . HAND SURGERY Left   . SALPINGECTOMY       OB History    Gravida  5   Para  2   Term  2   Preterm      AB  2   Living  2     SAB  2   TAB      Ectopic  0   Multiple      Live Births  2           History reviewed. No pertinent family history.  Social History   Tobacco Use  . Smoking status: Current Every Day Smoker    Packs/day: 0.25    Types: Cigarettes  . Smokeless tobacco: Never Used  Substance Use Topics  . 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 Drug use: Not Currently    Types: Marijuana    Home Medications Prior to Admission medications   Medication Sig Start Date End Date Taking? Authorizing Provider  gabapentin (NEURONTIN) 300 MG capsule Take 1 capsule (300 mg total) by mouth 3 (three) times daily. 10/11/18  Yes Johnn Hai, MD  Prenatal Vit-Fe Fumarate-FA (PRENATAL MULTIVITAMIN) TABS tablet Take 1 tablet by mouth daily. 05/17/19  Yes Laury Deep, CNM  risperiDONE (RISPERDAL) 2 MG tablet 1 in  am 2 at hs Patient taking differently: 2-4 mg 2 (two) times daily. 2mg  in am and 4mg  at hs 10/11/18  Yes Johnn Hai, MD  risperiDONE microspheres (RISPERDAL CONSTA) 50 MG injection Inject 2 mLs (50 mg total) into the muscle every 21 ( twenty-one) days. Due 4/27 10/11/18  Yes Johnn Hai, MD  benztropine (COGENTIN) 0.5 MG tablet Take 1 tablet (0.5 mg total) by mouth 2 (two) times daily. Patient not taking: Reported on 07/02/2019 10/11/18   Johnn Hai, MD  Blood Pressure Monitoring (BLOOD PRESSURE MONITOR AUTOMAT) DEVI 1 Device by Does not apply route daily. Automatic blood pressure cuff with regular cuff. Blood pressure to be monitored regularly at home. ICD-10 code: O63.90. 05/17/19   Donnamae Jude, MD    Allergies    Codeine  Review of Systems   Review of Systems    Constitutional: Negative for fever.  Respiratory: Negative for cough and shortness of breath.   Cardiovascular: Negative for chest pain.  Gastrointestinal: Positive for abdominal pain, nausea and vomiting.  Genitourinary: Positive for hematuria. Negative for dysuria.  Neurological: Negative for headaches.  All other systems reviewed and are negative.   Physical Exam Updated Vital Signs BP (!) 139/98 (BP Location: Left Arm)   Pulse (!) 116   Temp 98 F (36.7 C) (Oral)   Resp 16   SpO2 100%   Breastfeeding Unknown   Physical Exam Vitals and nursing note reviewed.  Constitutional:      Appearance: Normal appearance. She is well-developed.  HENT:     Head: Normocephalic and atraumatic.  Eyes:     General: Lids are normal.     Conjunctiva/sclera: Conjunctivae normal.     Pupils: Pupils are equal, round, and reactive to light.  Cardiovascular:     Rate and Rhythm: Normal rate and regular rhythm.     Pulses: Normal pulses.     Heart sounds: Normal heart sounds. No murmur. No friction rub. No gallop.   Pulmonary:     Effort: Pulmonary effort is normal.     Breath sounds: Normal breath sounds.     Comments: Lungs clear to auscultation bilaterally.  Symmetric chest rise.  No wheezing, rales, rhonchi. Abdominal:     Palpations: Abdomen is soft. Abdomen is not rigid.     Tenderness: There is generalized abdominal tenderness. There is left CVA tenderness. There is no right CVA tenderness or guarding.     Comments: Abdomen is soft, nondistended. Generalized tenderness noted throughout with no focal point. No rigidity, guarding. Mild left-sided CVA tenderness.  Musculoskeletal:        General: Normal range of motion.     Cervical back: Full passive range of motion without pain.  Skin:    General: Skin is warm and dry.     Capillary Refill: Capillary refill takes less than 2 seconds.  Neurological:     Mental Status: She is alert and oriented to person, place, and time.      Comments: A&O x 3  Psychiatric:        Speech: Speech normal.     Comments: Delusion of pregnancy but otherwise makes sense and is able to talk      ED Results / Procedures / Treatments   Labs (all labs ordered are listed, but only abnormal results are displayed) Labs Reviewed  URINALYSIS, ROUTINE W REFLEX MICROSCOPIC - Abnormal; Notable for the following components:      Result Value   Color, Urine AMBER (*)    APPearance  CLOUDY (*)    Ketones, ur 20 (*)    Protein, ur 100 (*)    Leukocytes,Ua MODERATE (*)    WBC, UA >50 (*)    Bacteria, UA MANY (*)    All other components within normal limits  COMPREHENSIVE METABOLIC PANEL - Abnormal; Notable for the following components:   Glucose, Bld 175 (*)    Calcium 12.6 (*)    All other components within normal limits  CBC - Abnormal; Notable for the following components:   WBC 17.2 (*)    RBC 5.14 (*)    Hemoglobin 16.0 (*)    HCT 49.0 (*)    All other components within normal limits  LIPASE, BLOOD  POCT PREGNANCY, URINE  I-STAT BETA HCG BLOOD, ED ([facility], [facility], [facility] ONLY)  POC SARS CORONAVIRUS 2 AG -  ED    EKG None  Radiology CT Renal Stone Study  Addendum Date: 07/02/2019   ADDENDUM REPORT: 07/02/2019 09:16 ADDENDUM: The impression should say multiple dilated small bowel loops in the lower abdomen and pelvis. Findings are consistent with small bowel obstruction. Nonobstructing stone identified in the right kidney. Electronically Signed   By: Abelardo Diesel M.D.   On: 07/02/2019 09:16   Result Date: 07/02/2019 CLINICAL DATA:  Diffuse abdomen pain EXAM: CT ABDOMEN AND PELVIS WITHOUT CONTRAST TECHNIQUE: Multidetector CT imaging of the abdomen and pelvis was performed following the standard protocol without IV contrast. COMPARISON:  November 01, 2018 FINDINGS: Lower chest: No acute abnormality. Hepatobiliary: No focal liver abnormality is seen. No gallstones, gallbladder wall thickening, or biliary dilatation. Pancreas: Unremarkable. No  pancreatic ductal dilatation or surrounding inflammatory changes. Spleen: Normal in size without focal abnormality. Adrenals/Urinary Tract: There is a 7 mm stone probably nonobstructing in 1 of the right renal calyx. There is no hydronephrosis bilaterally. The bilateral adrenal glands are normal. The bladder is normal. Stomach/Bowel: There are multiple dilated small bowel loops in the lower abdomen and pelvis. The colon is normal. The stomach is normal. The appendix is not seen but no inflammation is noted around cecum. Vascular/Lymphatic: No significant vascular findings are present. No enlarged abdominal or pelvic lymph nodes. Reproductive: Uterus and bilateral adnexa are unremarkable. Other: Ascites is identified in the abdomen. Musculoskeletal: No acute abnormality is identified. IMPRESSION: Multiple dilated small bowel loops in the lower abdomen and pelvis. The findings are consistent with small bowel obstruction. The patella is slightly superiorly located relative to the femoral tibial joint raising the question of superior subluxation. Electronically Signed: By: Abelardo Diesel M.D. On: 07/02/2019 08:41    Procedures Procedures (including critical care time)  Medications Ordered in ED Medications  sodium chloride 0.9 % bolus 1,000 mL (has no administration in time range)  diatrizoate meglumine-sodium (GASTROGRAFIN) 66-10 % solution 90 mL (has no administration in time range)  sodium chloride flush (NS) 0.9 % injection 3 mL (3 mLs Intravenous Given 07/02/19 0904)  fentaNYL (SUBLIMAZE) injection 50 mcg (50 mcg Intravenous Given 07/02/19 0903)  ondansetron (ZOFRAN) injection 4 mg (4 mg Intravenous Given 07/02/19 0900)  sodium chloride 0.9 % bolus 1,000 mL (0 mLs Intravenous Stopped 07/02/19 1117)  cefTRIAXone (ROCEPHIN) 1 g in sodium chloride 0.9 % 100 mL IVPB (0 g Intravenous Stopping Infusion hung by another clincian 07/02/19 1117)  ketorolac (TORADOL) 30 MG/ML injection 30 mg (30 mg Intravenous  Given 07/02/19 0901)  morphine 4 MG/ML injection 4 mg (4 mg Intravenous Given 07/02/19 1113)    ED Course  I have reviewed the triage vital signs  and the nursing notes.  Pertinent labs & imaging results that were available during my care of the patient were reviewed by me and considered in my medical decision making (see chart for details).    MDM Rules/Calculators/A&P                      38 year old female with possible history of PTSD, delusional pregnancy presents for evaluation of abdominal pain, vaginal bleeding. Patient thinks she is pregnant. She initially presented to [facility] where she was evaluated and found not to be pregnant. She was transferred to the emergency department for further evaluation. Patient states that despite the negative pregnancy test that was done at [facility], she is still convinced she is pregnant as her other pregnancies did not show positive pregnancy test. She reports she is still having diffuse abdominal pain. No fevers. On initial arrival, she is afebrile, nontoxic-appearing. She is slightly tachycardic but vitals otherwise stable. Labs already performed.  I-STAT beta negative. Urine pregnancy negative. UA does show small amount of protein, moderate leukocytes, pyuria, bacteria. There is some squamous epithelium so question contaminant but given findings, will plan to treat. CMP shows normal BUN and creatinine. CBC does show slight leukocytosis of 17.2. Hemoglobin is 16.0. Lipase is unremarkable. Given findings, will plan for CT renal study to ensure she does not have any kidney stone.  CT looks concerning for SBO. There is a non-obstructing stone in the right kidney. No other acute findings.   Discussed with Brooke (Gen Surg). Agrees with medical admission. They will come evaluate in the ED. Plan to hold off on NG tube.   Discussed with internal medicine. They accept patient for admission.   Discussed patient with Dr. Dema Severin (Gen Surg). He recommends bowel rest  and NG tube.   Portions of this note were generated with [employer]. Dictation errors may occur despite best attempts at proofreading.   Final Clinical Impression(s) / ED Diagnoses Final diagnoses:  Not currently pregnant  Small bowel obstruction ([place])  Acute cystitis with hematuria    Rx / DC Orders ED Discharge Orders         Ordered    Discharge patient     07/01/19 2035           Desma Mcgregor 07/02/19 1125    Volanda Napoleon, PA-C 07/02/19 1224    Gareth Morgan, MD 07/02/19 2200

## 2019-07-02 NOTE — Consult Note (Addendum)
CC: Abdominal pain  Requesting provider: Providence Lanius PA-C  HPI: Theresa Mccoy is an 38 y.o. female with hx of depression, hyperthyroidism, major depressive disorder with delusions - presented last night to [facility] hospital believing she was pregnant. She underwent workup and pregnancy ruled out and transferred over here to [facility] ED. she reports a now 3-day history of vague abdominal cramps/pain.  She reports she has had these before but they typically resolve after 1 or 2 days.  For the last 24 hours she is also had some associated nausea.  She had some emesis at home yesterday but none since she has been here.  She reports that she is intermittently passing gas-last time overnight.  Last BM was 36 hours ago.  She denies fever/chills.  PSH: C-sx - she denies any other abdominal or pelvic surgical history. Noted history of salpingectomy in her surgical history however  Past Medical History:  Diagnosis Date  . Anemia   . Depression   . Hyperthyroidism   . PTSD (post-traumatic stress disorder)   . Thyroid disease     Past Surgical History:  Procedure Laterality Date  . CESAREAN SECTION    . DILATION AND CURETTAGE OF UTERUS    . HAND SURGERY Left   . SALPINGECTOMY      History reviewed. No pertinent family history.  Social:  reports that she has been smoking cigarettes. She has been smoking about 0.25 packs per day. She has never used smokeless tobacco. She reports previous drug use. Drug: Marijuana. She reports that she does not drink alcohol.  Allergies:  Allergies  Allergen Reactions  . Codeine Other (See Comments)    constipation    Medications: I have reviewed the patient's current medications.  Results for orders placed or performed during the hospital encounter of 07/01/19 (from the past 48 hour(s))  Urinalysis, Routine w reflex microscopic     Status: Abnormal   Collection Time: 07/01/19  8:26 PM  Result Value Ref Range   Color, Urine AMBER (A) YELLOW    Comment:  BIOCHEMICALS MAY BE AFFECTED BY COLOR   APPearance CLOUDY (A) CLEAR   Specific Gravity, Urine 1.019 1.005 - 1.030   pH 7.0 5.0 - 8.0   Glucose, UA NEGATIVE NEGATIVE mg/dL   Hgb urine dipstick NEGATIVE NEGATIVE   Bilirubin Urine NEGATIVE NEGATIVE   Ketones, ur 20 (A) NEGATIVE mg/dL   Protein, ur 100 (A) NEGATIVE mg/dL   Nitrite NEGATIVE NEGATIVE   Leukocytes,Ua MODERATE (A) NEGATIVE   RBC / HPF 0-5 0 - 5 RBC/hpf   WBC, UA >50 (H) 0 - 5 WBC/hpf   Bacteria, UA MANY (A) NONE SEEN   Squamous Epithelial / LPF 6-10 0 - 5   WBC Clumps PRESENT    Mucus PRESENT    Ca Oxalate Crys, UA PRESENT     Comment: Performed at [facility], [address], [place], [place] [address]  Pregnancy, urine POC     Status: None   Collection Time: 07/01/19  8:29 PM  Result Value Ref Range   Preg Test, Ur NEGATIVE NEGATIVE    Comment:        THE SENSITIVITY OF THIS METHODOLOGY IS >24 mIU/mL   Lipase, blood     Status: None   Collection Time: 07/01/19  9:41 PM  Result Value Ref Range   Lipase 20 11 - 51 U/L    Comment: Performed at [facility], [place] [address] [place], [place] [address]  Comprehensive metabolic  panel     Status: Abnormal   Collection Time: 07/01/19  9:41 PM  Result Value Ref Range   Sodium 140 135 - 145 mmol/L   Potassium 4.0 3.5 - 5.1 mmol/L   Chloride 107 98 - 111 mmol/L   CO2 22 22 - 32 mmol/L   Glucose, Bld 175 (H) 70 - 99 mg/dL   BUN 13 6 - 20 mg/dL   Creatinine, Ser 0.91 0.44 - 1.00 mg/dL   Calcium 12.6 (H) 8.9 - 10.3 mg/dL   Total Protein 7.7 6.5 - 8.1 g/dL   Albumin 4.4 3.5 - 5.0 g/dL   AST 21 15 - 41 U/L   ALT 19 0 - 44 U/L   Alkaline Phosphatase 97 38 - 126 U/L   Total Bilirubin 0.4 0.3 - 1.2 mg/dL   GFR calc non Af Amer >60 >60 mL/min   GFR calc Af Amer >60 >60 mL/min   Anion gap 11 5 - 15    Comment: Performed at [facility], [address], [place], [place] [address]  CBC     Status: Abnormal   Collection Time: 07/01/19  9:41 PM  Result Value  Ref Range   WBC 17.2 (H) 4.0 - 10.5 K/uL   RBC 5.14 (H) 3.87 - 5.11 MIL/uL   Hemoglobin 16.0 (H) 12.0 - 15.0 g/dL   HCT 49.0 (H) 36.0 - 46.0 %   MCV 95.3 80.0 - 100.0 fL   MCH 31.1 26.0 - 34.0 pg   MCHC 32.7 30.0 - 36.0 g/dL   RDW 11.9 11.5 - 15.5 %   Platelets 344 150 - 400 K/uL   nRBC 0.0 0.0 - 0.2 %    Comment: Performed at [facility], [place] [address], [place], [place] [address]  I-Stat beta hCG blood, ED     Status: None   Collection Time: 07/01/19  9:46 PM  Result Value Ref Range   I-stat hCG, quantitative <5.0 <5 mIU/mL   Comment 3            Comment:   GEST. AGE      CONC.  (mIU/mL)   <=1 WEEK        5 - 50     2 WEEKS       50 - 500     3 WEEKS       100 - 10,000     4 WEEKS     1,000 - 30,000        FEMALE AND NON-PREGNANT FEMALE:     LESS THAN 5 mIU/mL   POC SARS Coronavirus 2 Ag-ED - Nasal Swab (BD Veritor Kit)     Status: None   Collection Time: 07/02/19  9:47 AM  Result Value Ref Range   SARS Coronavirus 2 Ag NEGATIVE NEGATIVE    Comment: (NOTE) SARS-CoV-2 antigen NOT DETECTED.  Negative results are presumptive.  Negative results do not preclude SARS-CoV-2 infection and should not be used as the sole basis for treatment or other patient management decisions, including infection  control decisions, particularly in the presence of clinical signs and  symptoms consistent with COVID-19, or in those who have been in contact with the virus.  Negative results must be combined with clinical observations, patient history, and epidemiological information. The expected result is Negative. Fact Sheet for Patients: PodPark.tn Fact Sheet for Healthcare Providers: GiftContent.is This test is not yet approved or cleared by the [place] FDA and  has been authorized for detection  and/or diagnosis of SARS-CoV-2 by FDA under an Emergency Use Authorization (EUA).  This EUA will remain in effect (meaning this  test can be used) for the duration of  the COVID-19 de claration under Section 564(b)(1) of the Act, 21 U.S.C. section 360bbb-3(b)(1), unless the authorization is terminated or revoked sooner.     CT Renal Stone Study  Addendum Date: 07/02/2019   ADDENDUM REPORT: 07/02/2019 09:16 ADDENDUM: The impression should say multiple dilated small bowel loops in the lower abdomen and pelvis. Findings are consistent with small bowel obstruction. Nonobstructing stone identified in the right kidney. Electronically Signed   By: Abelardo Diesel M.D.   On: 07/02/2019 09:16   Result Date: 07/02/2019 CLINICAL DATA:  Diffuse abdomen pain EXAM: CT ABDOMEN AND PELVIS WITHOUT CONTRAST TECHNIQUE: Multidetector CT imaging of the abdomen and pelvis was performed following the standard protocol without IV contrast. COMPARISON:  November 01, 2018 FINDINGS: Lower chest: No acute abnormality. Hepatobiliary: No focal liver abnormality is seen. No gallstones, gallbladder wall thickening, or biliary dilatation. Pancreas: Unremarkable. No pancreatic ductal dilatation or surrounding inflammatory changes. Spleen: Normal in size without focal abnormality. Adrenals/Urinary Tract: There is a 7 mm stone probably nonobstructing in 1 of the right renal calyx. There is no hydronephrosis bilaterally. The bilateral adrenal glands are normal. The bladder is normal. Stomach/Bowel: There are multiple dilated small bowel loops in the lower abdomen and pelvis. The colon is normal. The stomach is normal. The appendix is not seen but no inflammation is noted around cecum. Vascular/Lymphatic: No significant vascular findings are present. No enlarged abdominal or pelvic lymph nodes. Reproductive: Uterus and bilateral adnexa are unremarkable. Other: Ascites is identified in the abdomen. Musculoskeletal: No acute abnormality is identified. IMPRESSION: Multiple dilated small bowel loops in the lower abdomen and pelvis. The findings are consistent with small bowel  obstruction. The patella is slightly superiorly located relative to the femoral tibial joint raising the question of superior subluxation. Electronically Signed: By: Abelardo Diesel M.D. On: 07/02/2019 08:41    ROS - all of the below systems have been reviewed with the patient and positives are indicated with bold text General: chills, fever or night sweats, dry mouth Eyes: blurry vision or double vision ENT: epistaxis or sore throat Allergy/Immunology: itchy/watery eyes or nasal congestion Hematologic/Lymphatic: bleeding problems, blood clots or swollen lymph nodes Endocrine: temperature intolerance or unexpected weight changes Breast: new or changing breast lumps or nipple discharge Resp: cough, shortness of breath, or wheezing CV: chest pain or dyspnea on exertion GI: as per HPI GU: dysuria, trouble voiding, or hematuria MSK: joint pain or joint stiffness Neuro: TIA or stroke symptoms Derm: pruritus and skin lesion changes Psych: anxiety and depression  PE Blood pressure (!) 125/105, pulse (!) 117, temperature 98 F (36.7 C), temperature source Oral, resp. rate 18, SpO2 100 %, unknown if currently breastfeeding. Constitutional: NAD; conversant; no deformities; poor dentition and dry mucous membranes when she takes off her mask Eyes: Moist conjunctiva; no lid lag; anicteric; pupils equal and round Neck: Trachea midline; no thyromegaly Lungs: Normal respiratory effort; no tactile fremitus CV: HR 95, regular rhythm; no palpable thrills; no pitting edema GI: Abd soft, mildly ttp in lower quadrants; mildly distended; no rebound nor guarding; no palpable hepatosplenomegaly MSK: Lying in bed with no apparent deformity; no clubbing/cyanosis Psychiatric: Somewhat blunted affect, tangential thought process; alert and oriented x3 Lymphatic: No palpable cervical or axillary lymphadenopathy  Results for orders placed or performed during the hospital encounter of 07/01/19 (from the past  48  hour(s))  Urinalysis, Routine w reflex microscopic     Status: Abnormal   Collection Time: 07/01/19  8:26 PM  Result Value Ref Range   Color, Urine AMBER (A) YELLOW    Comment: BIOCHEMICALS MAY BE AFFECTED BY COLOR   APPearance CLOUDY (A) CLEAR   Specific Gravity, Urine 1.019 1.005 - 1.030   pH 7.0 5.0 - 8.0   Glucose, UA NEGATIVE NEGATIVE mg/dL   Hgb urine dipstick NEGATIVE NEGATIVE   Bilirubin Urine NEGATIVE NEGATIVE   Ketones, ur 20 (A) NEGATIVE mg/dL   Protein, ur 100 (A) NEGATIVE mg/dL   Nitrite NEGATIVE NEGATIVE   Leukocytes,Ua MODERATE (A) NEGATIVE   RBC / HPF 0-5 0 - 5 RBC/hpf   WBC, UA >50 (H) 0 - 5 WBC/hpf   Bacteria, UA MANY (A) NONE SEEN   Squamous Epithelial / LPF 6-10 0 - 5   WBC Clumps PRESENT    Mucus PRESENT    Ca Oxalate Crys, UA PRESENT     Comment: Performed at [facility], [address] [place], [place] [address]  Pregnancy, urine POC     Status: None   Collection Time: 07/01/19  8:29 PM  Result Value Ref Range   Preg Test, Ur NEGATIVE NEGATIVE    Comment:        THE SENSITIVITY OF THIS METHODOLOGY IS >24 mIU/mL   Lipase, blood     Status: None   Collection Time: 07/01/19  9:41 PM  Result Value Ref Range   Lipase 20 11 - 51 U/L    Comment: Performed at [facility], [place] [address], [place], [place] [address]  Comprehensive metabolic panel     Status: Abnormal   Collection Time: 07/01/19  9:41 PM  Result Value Ref Range   Sodium 140 135 - 145 mmol/L   Potassium 4.0 3.5 - 5.1 mmol/L   Chloride 107 98 - 111 mmol/L   CO2 22 22 - 32 mmol/L   Glucose, Bld 175 (H) 70 - 99 mg/dL   BUN 13 6 - 20 mg/dL   Creatinine, Ser 0.91 0.44 - 1.00 mg/dL   Calcium 12.6 (H) 8.9 - 10.3 mg/dL   Total Protein 7.7 6.5 - 8.1 g/dL   Albumin 4.4 3.5 - 5.0 g/dL   AST 21 15 - 41 U/L   ALT 19 0 - 44 U/L   Alkaline Phosphatase 97 38 - 126 U/L   Total Bilirubin 0.4 0.3 - 1.2 mg/dL   GFR calc non Af Amer >60 >60 mL/min   GFR calc Af Amer >60 >60 mL/min   Anion  gap 11 5 - 15    Comment: Performed at [facility] [address], [place], [place] [address]  CBC     Status: Abnormal   Collection Time: 07/01/19  9:41 PM  Result Value Ref Range   WBC 17.2 (H) 4.0 - 10.5 K/uL   RBC 5.14 (H) 3.87 - 5.11 MIL/uL   Hemoglobin 16.0 (H) 12.0 - 15.0 g/dL   HCT 49.0 (H) 36.0 - 46.0 %   MCV 95.3 80.0 - 100.0 fL   MCH 31.1 26.0 - 34.0 pg   MCHC 32.7 30.0 - 36.0 g/dL   RDW 11.9 11.5 - 15.5 %   Platelets 344 150 - 400 K/uL   nRBC 0.0 0.0 - 0.2 %    Comment: Performed at [facility], [place] [address] [place], [place] [address]  I-Stat beta hCG blood, ED  Status: None   Collection Time: 07/01/19  9:46 PM  Result Value Ref Range   I-stat hCG, quantitative <5.0 <5 mIU/mL   Comment 3            Comment:   GEST. AGE      CONC.  (mIU/mL)   <=1 WEEK        5 - 50     2 WEEKS       50 - 500     3 WEEKS       100 - 10,000     4 WEEKS     1,000 - 30,000        FEMALE AND NON-PREGNANT FEMALE:     LESS THAN 5 mIU/mL   POC SARS Coronavirus 2 Ag-ED - Nasal Swab (BD Veritor Kit)     Status: None   Collection Time: 07/02/19  9:47 AM  Result Value Ref Range   SARS Coronavirus 2 Ag NEGATIVE NEGATIVE    Comment: (NOTE) SARS-CoV-2 antigen NOT DETECTED.  Negative results are presumptive.  Negative results do not preclude SARS-CoV-2 infection and should not be used as the sole basis for treatment or other patient management decisions, including infection  control decisions, particularly in the presence of clinical signs and  symptoms consistent with COVID-19, or in those who have been in contact with the virus.  Negative results must be combined with clinical observations, patient history, and epidemiological information. The expected result is Negative. Fact Sheet for Patients: PodPark.tn Fact Sheet for Healthcare Providers: GiftContent.is This test is not yet approved or cleared by the [place] FDA and  has been authorized for detection and/or diagnosis of SARS-CoV-2 by FDA under an Emergency Use Authorization (EUA).  This EUA will remain in effect (meaning this test can be used) for the duration of  the COVID-19 de claration under Section 564(b)(1) of the Act, 21 U.S.C. section 360bbb-3(b)(1), unless the authorization is terminated or revoked sooner.     CT Renal Stone Study  Addendum Date: 07/02/2019   ADDENDUM REPORT: 07/02/2019 09:16 ADDENDUM: The impression should say multiple dilated small bowel loops in the lower abdomen and pelvis. Findings are consistent with small bowel obstruction. Nonobstructing stone identified in the right kidney. Electronically Signed   By: Abelardo Diesel M.D.   On: 07/02/2019 09:16   Result Date: 07/02/2019 CLINICAL DATA:  Diffuse abdomen pain EXAM: CT ABDOMEN AND PELVIS WITHOUT CONTRAST TECHNIQUE: Multidetector CT imaging of the abdomen and pelvis was performed following the standard protocol without IV contrast. COMPARISON:  November 01, 2018 FINDINGS: Lower chest: No acute abnormality. Hepatobiliary: No focal liver abnormality is seen. No gallstones, gallbladder wall thickening, or biliary dilatation. Pancreas: Unremarkable. No pancreatic ductal dilatation or surrounding inflammatory changes. Spleen: Normal in size without focal abnormality. Adrenals/Urinary Tract: There is a 7 mm stone probably nonobstructing in 1 of the right renal calyx. There is no hydronephrosis bilaterally. The bilateral adrenal glands are normal. The bladder is normal. Stomach/Bowel: There are multiple dilated small bowel loops in the lower abdomen and pelvis. The colon is normal. The stomach is normal. The appendix is not seen but no inflammation is noted around cecum. Vascular/Lymphatic: No significant vascular findings are present. No enlarged abdominal or pelvic lymph nodes. Reproductive: Uterus and bilateral adnexa are unremarkable. Other: Ascites is identified in the  abdomen. Musculoskeletal: No acute abnormality is identified. IMPRESSION: Multiple dilated small bowel loops in the lower abdomen and pelvis. The findings are consistent with small bowel obstruction. The  patella is slightly superiorly located relative to the femoral tibial joint raising the question of superior subluxation. Electronically Signed: By: Abelardo Diesel M.D. On: 07/02/2019 08:41   A/P: Theresa Mccoy is a 38 y.o. female with hx of  depression, hyperthyroidism, major depressive disorder with delusions here with small bowel obstruction - likely adhesive given location of dilated bowel being in pelvis in setting of c-sxs and possible salpingectomy  -NPO -IVF - bolus now then MIVF - likely volume contracted to some degree -NG tube to low intermittent suction; small bowel obstruction protocol ordered -Acute cystitis/UTI - as per medicine -Appreciate TRH assistance in her care  -I spent time at bedside discussing her diagnosis and management strategy moving forward - NPO, NG tube for decompression, SBO protocol. We discussed the relevant anatomy and pathophysiology of SBO. We discussed role for surgery if she fails to improve or worsens in coming days as well. She expressed understanding and agreement with plan moving forward  Sharon Mt. Dema Severin, M.D. [facility] Use [email or web address] to contact on call provider

## 2019-07-02 NOTE — ED Notes (Signed)
Pt called RN to room - stated she drank some water that was left on bedside table and "something moved in my throat". Advised pt she has an NG tube inserted and she may feel an irritation - Pt voiced understanding after much encouragement that she is NPO and waiting for x-ray to be performed.

## 2019-07-02 NOTE — Progress Notes (Signed)
Pt arrived to room 6N11 via stretcher from the ED. Received report from [place], Therapist, sports. See assessment. Will continue to monitor.

## 2019-07-02 NOTE — ED Notes (Signed)
Pt ambulated to bathroom w/assistance x 1 and back to room. Pt noted to be touching IV tubing and IV insertion site - stating "there is no fluid in the tube". Advised pt IV fluid is clear. Pt continues to state there is none. Pt encouraged to not touch IV tubing.

## 2019-07-02 NOTE — ED Notes (Signed)
NG Tube placed by April, RN, and [place], RN - pt tolerated well. X-ray Tech aware ready for portable x-ray.

## 2019-07-02 NOTE — ED Notes (Signed)
Pt being transported to Rm 6N11 via stretcher w/telemetry by Bary Castilla, NT.

## 2019-07-03 ENCOUNTER — Inpatient Hospital Stay (HOSPITAL_COMMUNITY): Payer: Medicaid Other

## 2019-07-03 LAB — COMPREHENSIVE METABOLIC PANEL
ALT: 14 U/L (ref 0–44)
AST: 18 U/L (ref 15–41)
Albumin: 2.7 g/dL — ABNORMAL LOW (ref 3.5–5.0)
Alkaline Phosphatase: 62 U/L (ref 38–126)
Anion gap: 7 (ref 5–15)
BUN: 18 mg/dL (ref 6–20)
CO2: 22 mmol/L (ref 22–32)
Calcium: 11.4 mg/dL — ABNORMAL HIGH (ref 8.9–10.3)
Chloride: 116 mmol/L — ABNORMAL HIGH (ref 98–111)
Creatinine, Ser: 1.15 mg/dL — ABNORMAL HIGH (ref 0.44–1.00)
GFR calc Af Amer: >60 mL/min (ref >60)
GFR calc non Af Amer: >60 mL/min (ref >60)
Glucose, Bld: 111 mg/dL — ABNORMAL HIGH (ref 70–99)
Potassium: 4 mmol/L (ref 3.5–5.1)
Sodium: 145 mmol/L (ref 135–145)
Total Bilirubin: 0.8 mg/dL (ref 0.3–1.2)
Total Protein: 5.2 g/dL — ABNORMAL LOW (ref 6.5–8.1)

## 2019-07-03 LAB — LACTIC ACID, PLASMA
Lactic Acid, Venous: 2.6 mmol/L (ref 0.5–1.9)
Lactic Acid, Venous: 1.3 mmol/L (ref 0.5–1.9)
Lactic Acid, Venous: 3 mmol/L (ref 0.5–1.9)

## 2019-07-03 LAB — CBC
HCT: 39.2 % (ref 36.0–46.0)
Hemoglobin: 13.1 g/dL (ref 12.0–15.0)
MCH: 31.6 pg (ref 26.0–34.0)
MCHC: 33.4 g/dL (ref 30.0–36.0)
MCV: 94.5 fL (ref 80.0–100.0)
Platelets: 238 10*3/uL (ref 150–400)
RBC: 4.15 MIL/uL (ref 3.87–5.11)
RDW: 12.5 % (ref 11.5–15.5)
WBC: 23.7 10*3/uL — ABNORMAL HIGH (ref 4.0–10.5)
nRBC: 0 % (ref 0.0–0.2)

## 2019-07-03 MED ORDER — LACTATED RINGERS IV BOLUS
1000.0000 mL | Freq: Once | INTRAVENOUS | Status: AC
  Administered 2019-07-03: 09:00:00 1000 mL via INTRAVENOUS

## 2019-07-03 MED ORDER — ACETAMINOPHEN 160 MG/5ML PO SOLN
650.0000 mg | Freq: Four times a day (QID) | ORAL | Status: DC | PRN
  Administered 2019-07-10: 15:00:00 650 mg
  Filled 2019-07-03 (×2): qty 20.3

## 2019-07-03 MED ORDER — ENOXAPARIN SODIUM 40 MG/0.4ML ~~LOC~~ SOLN
40.0000 mg | SUBCUTANEOUS | Status: DC
  Administered 2019-07-03 – 2019-07-12 (×10): 40 mg via SUBCUTANEOUS
  Filled 2019-07-03 (×10): qty 0.4

## 2019-07-03 MED ORDER — SODIUM CHLORIDE 0.9 % IV BOLUS
1000.0000 mL | Freq: Once | INTRAVENOUS | Status: AC
  Administered 2019-07-04: 01:00:00 1000 mL via INTRAVENOUS

## 2019-07-03 MED ORDER — LACTATED RINGERS IV BOLUS
1000.0000 mL | Freq: Once | INTRAVENOUS | Status: AC
  Administered 2019-07-03: 08:00:00 1000 mL via INTRAVENOUS

## 2019-07-03 MED ORDER — PIPERACILLIN-TAZOBACTAM 3.375 G IVPB: 3.3750 g | Freq: Three times a day (TID) | INTRAVENOUS | Status: DC

## 2019-07-03 MED ORDER — SODIUM CHLORIDE 0.9 % IV BOLUS
1000.0000 mL | Freq: Once | INTRAVENOUS | Status: AC
  Administered 2019-07-03: 03:00:00 1000 mL via INTRAVENOUS

## 2019-07-03 MED ORDER — PIPERACILLIN-TAZOBACTAM 3.375 G IVPB
3.3750 g | Freq: Three times a day (TID) | INTRAVENOUS | Status: DC
  Administered 2019-07-03 – 2019-07-14 (×34): 3.375 g via INTRAVENOUS
  Filled 2019-07-03 (×33): qty 50

## 2019-07-03 MED ORDER — HYDROMORPHONE HCL 1 MG/ML IJ SOLN
1.0000 mg | Freq: Once | INTRAMUSCULAR | Status: AC
  Administered 2019-07-03: 1 mg via INTRAVENOUS
  Filled 2019-07-03: qty 1

## 2019-07-03 NOTE — Progress Notes (Signed)
Subjective:  Patient seen at bedside this morning.  Patient states she has continued severe abdominal pain.  Patient informed on plan of care, all questions answered.   Objective:    Vital Signs (last 24 hours): Vitals:   07/02/19 1729 07/02/19 2019 07/02/19 2319 07/03/19 0321  BP: (!) 137/100 (!) 129/93 (!) 125/94 (!) 116/94  Pulse: (!) 135 (!) 135 (!) 158 (!) 144  Resp: 20     Temp: 99 F (37.2 C) 98.2 F (36.8 C)  98.9 F (37.2 C)  TempSrc: Axillary Oral    SpO2: 99% 99% 97% 99%  Weight:      Height:       Physical Exam: General Alert and answers questions appropriately, in pain  Cardiac Regular rate and rhythm, no murmurs, rubs, or gallops  Pulmonary Clear to auscultation bilaterally without wheezes, rhonchi, or rales  Abdominal Diffusely tender to palpation, soft, without significant distention  Extremities No peripheral edema   CBC Latest Ref Rng & Units 07/03/2019 07/02/2019 07/01/2019  WBC 4.0 - 10.5 K/uL 23.7(H) 23.5(H) 17.2(H)  Hemoglobin 12.0 - 15.0 g/dL 13.1 14.8 16.0(H)  Hematocrit 36.0 - 46.0 % 39.2 44.0 49.0(H)  Platelets 150 - 400 K/uL 238 245 344   BMP Latest Ref Rng & Units 07/03/2019 07/02/2019 07/01/2019  Glucose 70 - 99 mg/dL 111(H) - 175(H)  BUN 6 - 20 mg/dL 18 - 13  Creatinine 0.44 - 1.00 mg/dL 1.15(H) 0.86 0.91  BUN/Creat Ratio 9 - 23 - - -  Sodium 135 - 145 mmol/L 145 - 140  Potassium 3.5 - 5.1 mmol/L 4.0 - 4.0  Chloride 98 - 111 mmol/L 116(H) - 107  CO2 22 - 32 mmol/L 22 - 22  Calcium 8.9 - 10.3 mg/dL 11.4(H) - 12.6(H)    Assessment/Plan:   Active Problems:   Small bowel obstruction (HCC)   Urinary tract infection   SBO (small bowel obstruction) ([place])  Patient is a 38 year old female with past medical history of anemia, depression, hypothyroidism, PTSD who presented with 1 day history of abdominal pain and was found to have small bowel obstruction and a urinary tract infection.  # Small bowel obstruction: Patient with 1 day history  of abdominal pain, nausea, and vomiting.  Patient was evaluated by general surgery and assesses that obstruction is likely adhesive in origin given location and pelvis in setting of C-sections and possible salpingectomy.  * This AM patient was tachycardic in 140s, hypotensive with systolic of 89. Creatinine elevated to 1.15 from 0.86 yesterday. LR 1L fluid bolus x2 administered. Vitals responded following fluid resuscitation, BP 124/84, HR 121. Patient has received total of approximately 6 L of fluids since admission, now on mIVF of 150 mL/hr NS.  * Lactic acid of 2.6, will trend value *Covering empirically with zosyn started, ceftriaxone (previously started for UTI) discontinued. *NPO and NG tube placed yesterday with low intermittent suction *General surgery following, we appreciate their recommendations - considering surgery if no improvement in 24 hours *Tylenol 650 mg every 6 hours as needed for mild pain/fever + Toradol 15 mg every 6 hours as needed for severe pain *Zofran 4 mg every 6 hours as needed for nausea, QTC within normal limits  # Urinary tract infection: Urinalysis yesterday revealed moderate leukocytes, greater than 50 white blood cells, many bacteria patient reported pain with urination.  Patient remains afebrile. Patient has elevated white count: 17.2 -> 23.5 -> 23.7 * Continue zosyn * Urine culture shows >= 100,000 colonies GNR, will followup speciation  #  Elevated creatinine: Creatinine elevated to 1.15 from 0.86 yesterday. Post-void residual of 139 ml. Likely secondary to hypovolemia given vitals. Volume resuscitation initiated. See above.  Diet: NPO DVT Ppx: Lovenox 40 mg daily Dispo: Anticipated discharge pending clinical improvement  Jeanmarie Hubert, MD 07/03/2019, 6:12 AM Pager: [phone]

## 2019-07-03 NOTE — Progress Notes (Addendum)
   07/03/19 0744  MEWS Score  Resp 18  Pulse Rate (!) 136  BP 99/88  Temp 98.9 F (37.2 C)  SpO2 100 %  MEWS Score  MEWS RR 0  MEWS Pulse 3  MEWS Systolic 1  MEWS LOC 0  MEWS Temp 0  MEWS Score 4  MEWS Score Color Red  MEWS Assessment  Is this an acute change? Yes  MEWS guidelines implemented *See Row Information* Red  Rapid Response Notification  Name of Rapid Response RN Notified [place], RN  Date Rapid Response Notified 07/03/19  Time Rapid Response Notified 0756  Provider Notification  Provider Name/Title Jeanmarie Hubert, MD  Date Provider Notified 07/03/19  Time Provider Notified [phone]  Notification Type Page  Notification Reason Other (Comment) (pt now red mews)  Response See new orders  Date of Provider Response 07/03/19  Time of Provider Response 0759    Racheal Patches, RN

## 2019-07-03 NOTE — Progress Notes (Signed)
CRITICAL VALUE ALERT  Critical Value:  Lactic acid 2.6  Date & Time Notied:  07/03/2019 at 1011  Provider Notified: Jeanmarie Hubert, MD  Orders Received/Actions taken: Awaiting MD response

## 2019-07-03 NOTE — Progress Notes (Signed)
Patient was seen and evaluated at bedside after we received paged about she has 10/10 abdominal pain. She received Toradol about 2 hours ago and it did not help that much.    When saw the patient, she was asleep and appears comfortable. Easily aroused to verbal stimuli. She says that her abdomen still hurts the same.   Assessment and plan: She is a 38 y/o lady who presented with abdominal pain and found to have small bowel obstruction.  Gen surgery is following. Patient has been npo and has NG tube. Had low BP this AM concerning for hypovolemic shock in setting of SBO. But it responded well to 2 more Bolus LR.   She has consistently been tachycardiac (EKG repeated twice since last night and was sinus tach)  Ph/E: BP 130/80, HR 130-140 Abdominal physical exam is unchanged compairing to this AM: some distention but soft. Is tender to palpation generally.  Will repeat KUB and monitor.  -IV Dilaudid 1 mg once -KUB standing -General surgery is on board. Appreciate recommendations -Continue keeping Npo and NG tube -[place]

## 2019-07-03 NOTE — Progress Notes (Signed)
IM Dr Madilyn Fireman notified of heart rate- and Dr, currently on floor to evaluate pt

## 2019-07-03 NOTE — Plan of Care (Signed)
  Problem: Education: Goal: Knowledge of General Education information will improve Description: Including pain rating scale, medication(s)/side effects and non-pharmacologic comfort measures Outcome: Progressing   Problem: Health Behavior/Discharge Planning: Goal: Ability to manage health-related needs will improve Outcome: Progressing   Problem: Clinical Measurements: Goal: Respiratory complications will improve Outcome: Progressing Goal: Cardiovascular complication will be avoided Outcome: Progressing   Problem: Activity: Goal: Risk for activity intolerance will decrease Outcome: Progressing   Problem: Elimination: Goal: Will not experience complications related to urinary retention Outcome: Progressing   Problem: Pain Managment: Goal: General experience of comfort will improve Outcome: Progressing   Problem: Safety: Goal: Ability to remain free from injury will improve Outcome: Progressing   Problem: Skin Integrity: Goal: Risk for impaired skin integrity will decrease Outcome: Progressing

## 2019-07-03 NOTE — Progress Notes (Signed)
Subjective/Chief Complaint: Pt with c/o abd pain No BM/flatus   Objective: Vital signs in last 24 hours: Temp:  [98.2 F (36.8 C)-100.3 F (37.9 C)] 98.9 F (37.2 C) (12/27 0321) Pulse Rate:  [115-158] 144 (12/27 0321) Resp:  [16-20] 20 (12/26 1729) BP: (116-139)/(93-100) 116/94 (12/27 0321) SpO2:  [97 %-100 %] 99 % (12/27 0321) Weight:  [74.4 kg] 74.4 kg (12/26 1624) Last BM Date: 07/01/19  Intake/Output from previous day: 12/26 0701 - 12/27 0700 In: 2362 [I.V.:1242; NG/GT:120; IV Piggyback:1000] Out: 350 [Emesis/NG output:350] Intake/Output this shift: No intake/output data recorded.  PE: Constitutional: No acute distress, conversant, appears states age. Eyes: Anicteric sclerae, moist conjunctiva, no lid lag Lungs: Clear to auscultation bilaterally, normal respiratory effort CV: regular rate and rhythm, no murmurs, no peripheral edema, pedal pulses 2+ GI: Soft, no masses or hepatosplenomegaly, tender to palpation x4Q-no rebound/guarding Skin: No rashes, palpation reveals normal turgor Psychiatric: appropriate judgment and insight, oriented to person, place, and time   Lab Results:  Recent Labs    07/02/19 1625 07/03/19 0411  WBC 23.5* 23.7*  HGB 14.8 13.1  HCT 44.0 39.2  PLT 245 238   BMET Recent Labs    07/01/19 2141 07/02/19 1625 07/03/19 0411  NA 140  --  145  K 4.0  --  4.0  CL 107  --  116*  CO2 22  --  22  GLUCOSE 175*  --  111*  BUN 13  --  18  CREATININE 0.91 0.86 1.15*  CALCIUM 12.6*  --  11.4*   Studies/Results: DG Abd Portable 1V-Small Bowel Obstruction Protocol-initial, 8 hr delay  Result Date: 07/02/2019 CLINICAL DATA:  38 year old female with small bowel obstruction. EXAM: PORTABLE ABDOMEN - 1 VIEW COMPARISON:  Earlier radiograph dated 07/02/2019. FINDINGS: Enteric tube with tip in the midbody or distal stomach. Oral contrast is noted within multiple loops of small bowel in the left hemiabdomen. No passage of contrast identified to  the colon on this images. Continued follow-up recommended. The small bowel loops measure up to 2.6 cm in caliber. There is an 8 mm right renal calculus. The osseous structures and soft tissues are unremarkable. IMPRESSION: 1. No passage of contrast to the colon at this time. Continued follow-up recommended. 2. Enteric tube with tip in the midbody or distal stomach. 3. Right renal calculus. Electronically Signed   By: Anner Crete M.D.   On: 07/02/2019 23:47   DG Abd Portable 1V-Small Bowel Protocol-Position Verification  Result Date: 07/02/2019 CLINICAL DATA:  NG tube placement. EXAM: PORTABLE ABDOMEN - 1 VIEW COMPARISON:  None. FINDINGS: An NG tube has been inserted and the tip is in the midbody. Moderate air in the colon. Lung bases are clear. No visible bone abnormality. IMPRESSION: The NG tube tip is in the mid body of the stomach. Electronically Signed   By: Lorriane Shire M.D.   On: 07/02/2019 14:01   CT Renal Stone Study  Addendum Date: 07/02/2019   ADDENDUM REPORT: 07/02/2019 09:16 ADDENDUM: The impression should say multiple dilated small bowel loops in the lower abdomen and pelvis. Findings are consistent with small bowel obstruction. Nonobstructing stone identified in the right kidney. Electronically Signed   By: Abelardo Diesel M.D.   On: 07/02/2019 09:16   Result Date: 07/02/2019 CLINICAL DATA:  Diffuse abdomen pain EXAM: CT ABDOMEN AND PELVIS WITHOUT CONTRAST TECHNIQUE: Multidetector CT imaging of the abdomen and pelvis was performed following the standard protocol without IV contrast. COMPARISON:  November 01, 2018 FINDINGS: Lower  chest: No acute abnormality. Hepatobiliary: No focal liver abnormality is seen. No gallstones, gallbladder wall thickening, or biliary dilatation. Pancreas: Unremarkable. No pancreatic ductal dilatation or surrounding inflammatory changes. Spleen: Normal in size without focal abnormality. Adrenals/Urinary Tract: There is a 7 mm stone probably nonobstructing in 1  of the right renal calyx. There is no hydronephrosis bilaterally. The bilateral adrenal glands are normal. The bladder is normal. Stomach/Bowel: There are multiple dilated small bowel loops in the lower abdomen and pelvis. The colon is normal. The stomach is normal. The appendix is not seen but no inflammation is noted around cecum. Vascular/Lymphatic: No significant vascular findings are present. No enlarged abdominal or pelvic lymph nodes. Reproductive: Uterus and bilateral adnexa are unremarkable. Other: Ascites is identified in the abdomen. Musculoskeletal: No acute abnormality is identified. IMPRESSION: Multiple dilated small bowel loops in the lower abdomen and pelvis. The findings are consistent with small bowel obstruction. The patella is slightly superiorly located relative to the femoral tibial joint raising the question of superior subluxation. Electronically Signed: By: Abelardo Diesel M.D. On: 07/02/2019 08:41    Anti-infectives: Anti-infectives (From admission, onward)   Start     Dose/Rate Route Frequency Ordered Stop   07/03/19 0900  cefTRIAXone (ROCEPHIN) 1 g in sodium chloride 0.9 % 100 mL IVPB     1 g 200 mL/hr over 30 Minutes Intravenous  Once 07/02/19 1351     07/02/19 0830  cefTRIAXone (ROCEPHIN) 1 g in sodium chloride 0.9 % 100 mL IVPB     1 g 200 mL/hr over 30 Minutes Intravenous  Once 07/02/19 [id number] 07/02/19 1117      Assessment/Plan: 22 F SBO Depression Hyperthyroidism MDD  -Con't NGT for now.  Will f/u repeat KUB today  -Mobilize as tol -I d/w her that if not better in next 24h may require surgery   LOS: 1 day    Ralene Ok 07/03/2019

## 2019-07-03 NOTE — Progress Notes (Addendum)
Pharmacy Antibiotic Note  Theresa Mccoy is a 38 y.o. female admitted on 07/01/2019 with SBO and found to have UTI.  Pharmacy has been consulted for Zosyn dosing.  Tmax 100.3, WBC elevated to 23.7.  Plan: Zosyn 3.375g IV q8h (4 hour infusion).  .Monitor clinical picture, renal function F/U C&S, abx de-escalation, LOT F/u surgery consult for SBO   Height: 5' (152.4 cm) Weight: 164 lb (74.4 kg) IBW/kg (Calculated) : 45.5  Temp (24hrs), Avg:98.6 F (37 C), Min:97.7 F (36.5 C), Max:100.3 F (37.9 C)  Recent Labs  Lab 07/01/19 2141 07/02/19 1625 07/03/19 0411  WBC 17.2* 23.5* 23.7*  CREATININE 0.91 0.86 1.15*    Estimated Creatinine Clearance: 59.8 mL/min (A) (by C-G formula based on SCr of 1.15 mg/dL (H)).    Allergies  Allergen Reactions  . Codeine Other (See Comments)    constipation    Antimicrobials this admission: 12/27 Zosyn>> 12/26 CTX >12/27  Microbiology results: 12/25 UCx: 100k GNRs (pending)   Brendolyn Patty, PharmD PGY2 Pharmacy Resident Phone [phone]  07/03/2019   10:08 AM

## 2019-07-03 NOTE — Progress Notes (Signed)
Patient voided in [facility]. Post void residual thru bladder scan showed only 139 mL.  Patient stated that she did not feel her bladder is full or any discomfort but abdomen is distended.  Administered PRN toradol for pain during the bladder scan. Will monitor and endorse to day shift RN appropriately.

## 2019-07-04 ENCOUNTER — Inpatient Hospital Stay (HOSPITAL_COMMUNITY): Payer: Medicaid Other

## 2019-07-04 LAB — CBC
HCT: 34.6 % — ABNORMAL LOW (ref 36.0–46.0)
Hemoglobin: 11.6 g/dL — ABNORMAL LOW (ref 12.0–15.0)
MCH: 31.2 pg (ref 26.0–34.0)
MCHC: 33.5 g/dL (ref 30.0–36.0)
MCV: 93 fL (ref 80.0–100.0)
Platelets: 177 10*3/uL (ref 150–400)
RBC: 3.72 MIL/uL — ABNORMAL LOW (ref 3.87–5.11)
RDW: 12.6 % (ref 11.5–15.5)
WBC: 6.6 10*3/uL (ref 4.0–10.5)
nRBC: 0 % (ref 0.0–0.2)

## 2019-07-04 LAB — COMPREHENSIVE METABOLIC PANEL
ALT: 15 U/L (ref 0–44)
AST: 19 U/L (ref 15–41)
Albumin: 2.1 g/dL — ABNORMAL LOW (ref 3.5–5.0)
Alkaline Phosphatase: 53 U/L (ref 38–126)
Anion gap: 5 (ref 5–15)
BUN: 14 mg/dL (ref 6–20)
CO2: 21 mmol/L — ABNORMAL LOW (ref 22–32)
Calcium: 11.7 mg/dL — ABNORMAL HIGH (ref 8.9–10.3)
Chloride: 119 mmol/L — ABNORMAL HIGH (ref 98–111)
Creatinine, Ser: 0.94 mg/dL (ref 0.44–1.00)
GFR calc Af Amer: >60 mL/min (ref >60)
GFR calc non Af Amer: >60 mL/min (ref >60)
Glucose, Bld: 88 mg/dL (ref 70–99)
Potassium: 3.8 mmol/L (ref 3.5–5.1)
Sodium: 145 mmol/L (ref 135–145)
Total Bilirubin: 0.8 mg/dL (ref 0.3–1.2)
Total Protein: 4.5 g/dL — ABNORMAL LOW (ref 6.5–8.1)

## 2019-07-04 LAB — TSH: TSH: 0.562 u[IU]/mL (ref 0.350–4.500)

## 2019-07-04 LAB — LACTIC ACID, PLASMA: Lactic Acid, Venous: 1.4 mmol/L (ref 0.5–1.9)

## 2019-07-04 LAB — URINE CULTURE: Culture: >=100000 — AB

## 2019-07-04 MED ORDER — LACTATED RINGERS IV SOLN: INTRAVENOUS | Status: DC

## 2019-07-04 MED ORDER — MORPHINE SULFATE (PF) 2 MG/ML IV SOLN
2.0000 mg | Freq: Once | INTRAVENOUS | Status: AC
  Administered 2019-07-04: 11:00:00 2 mg via INTRAVENOUS
  Filled 2019-07-04: qty 1

## 2019-07-04 NOTE — Progress Notes (Signed)
Dr Madilyn Fireman notified in regards to the improved lactic acid- and the heart rate continues 130's-140's- no new orders

## 2019-07-04 NOTE — Progress Notes (Signed)
Patient ID: Theresa Mccoy, female   DOB: [birth year], 37 y.o.   MRN: [id number] [facility] Progress Note:   * No surgery found *  Subjective: Mental status is anxious but responds to questions Objective: Vital signs in last 24 hours: Temp:  [97.7 F (36.5 C)-100.4 F (38 C)] 97.8 F (36.6 C) (12/28 0701) Pulse Rate:  [100-146] 118 (12/28 0701) Resp:  [18-24] 20 (12/28 0701) BP: (113-137)/(73-99) 125/93 (12/28 0701) SpO2:  [97 %-100 %] 97 % (12/28 0701)  Intake/Output from previous day: 12/27 0701 - 12/28 0700 In: 2984.1 [I.V.:2836.9; IV Piggyback:147.2] Out: 1600 [Urine:950; Emesis/NG output:650] Intake/Output this shift: No intake/output data recorded.  Physical Exam: Work of breathing is not labored.  NG inserted and brownish liquid returning into cannister.  Abdomen is mildly distended.  No flatus;    Lab Results:  Results for orders placed or performed during the hospital encounter of 07/01/19 (from the past 48 hour(s))  POC SARS Coronavirus 2 Ag-ED - Nasal Swab (BD Veritor Kit)     Status: None   Collection Time: 07/02/19  9:47 AM  Result Value Ref Range   SARS Coronavirus 2 Ag NEGATIVE NEGATIVE    Comment: (NOTE) SARS-CoV-2 antigen NOT DETECTED.  Negative results are presumptive.  Negative results do not preclude SARS-CoV-2 infection and should not be used as the sole basis for treatment or other patient management decisions, including infection  control decisions, particularly in the presence of clinical signs and  symptoms consistent with COVID-19, or in those who have been in contact with the virus.  Negative results must be combined with clinical observations, patient history, and epidemiological information. The expected result is Negative. Fact Sheet for Patients: PodPark.tn Fact Sheet for Healthcare Providers: GiftContent.is This test is not yet approved or cleared by the [place] FDA and   has been authorized for detection and/or diagnosis of SARS-CoV-2 by FDA under an Emergency Use Authorization (EUA).  This EUA will remain in effect (meaning this test can be used) for the duration of  the COVID-19 de claration under Section 564(b)(1) of the Act, 21 U.S.C. section 360bbb-3(b)(1), unless the authorization is terminated or revoked sooner.   HIV Antibody (routine testing w rflx)     Status: None   Collection Time: 07/02/19  4:25 PM  Result Value Ref Range   HIV Screen 4th Generation wRfx NON REACTIVE NON REACTIVE    Comment: Performed at [facility], [address], [place], [place] [address]  CBC     Status: Abnormal   Collection Time: 07/02/19  4:25 PM  Result Value Ref Range   WBC 23.5 (H) 4.0 - 10.5 K/uL   RBC 4.74 3.87 - 5.11 MIL/uL   Hemoglobin 14.8 12.0 - 15.0 g/dL   HCT 44.0 36.0 - 46.0 %   MCV 92.8 80.0 - 100.0 fL   MCH 31.2 26.0 - 34.0 pg   MCHC 33.6 30.0 - 36.0 g/dL   RDW 12.3 11.5 - 15.5 %   Platelets 245 150 - 400 K/uL   nRBC 0.0 0.0 - 0.2 %    Comment: Performed at [facility], [place] [address], [place], [place] [address]  Creatinine, serum     Status: None   Collection Time: 07/02/19  4:25 PM  Result Value Ref Range   Creatinine, Ser 0.86 0.44 - 1.00 mg/dL   GFR calc non Af Amer >60 >60 mL/min   GFR calc Af Amer >60 >60 mL/min    Comment: Performed  at [facility], [place] [address], [place], [place] [address]  Comprehensive metabolic panel     Status: Abnormal   Collection Time: 07/03/19  4:11 AM  Result Value Ref Range   Sodium 145 135 - 145 mmol/L   Potassium 4.0 3.5 - 5.1 mmol/L   Chloride 116 (H) 98 - 111 mmol/L   CO2 22 22 - 32 mmol/L   Glucose, Bld 111 (H) 70 - 99 mg/dL   BUN 18 6 - 20 mg/dL   Creatinine, Ser 1.15 (H) 0.44 - 1.00 mg/dL   Calcium 11.4 (H) 8.9 - 10.3 mg/dL   Total Protein 5.2 (L) 6.5 - 8.1 g/dL   Albumin 2.7 (L) 3.5 - 5.0 g/dL   AST 18 15 - 41 U/L   ALT 14 0 - 44 U/L   Alkaline Phosphatase 62 38 - 126  U/L   Total Bilirubin 0.8 0.3 - 1.2 mg/dL   GFR calc non Af Amer >60 >60 mL/min   GFR calc Af Amer >60 >60 mL/min   Anion gap 7 5 - 15    Comment: Performed at [facility], [place] [address] [place], [place] [address]  CBC     Status: Abnormal   Collection Time: 07/03/19  4:11 AM  Result Value Ref Range   WBC 23.7 (H) 4.0 - 10.5 K/uL   RBC 4.15 3.87 - 5.11 MIL/uL   Hemoglobin 13.1 12.0 - 15.0 g/dL   HCT 39.2 36.0 - 46.0 %   MCV 94.5 80.0 - 100.0 fL   MCH 31.6 26.0 - 34.0 pg   MCHC 33.4 30.0 - 36.0 g/dL   RDW 12.5 11.5 - 15.5 %   Platelets 238 150 - 400 K/uL   nRBC 0.0 0.0 - 0.2 %    Comment: Performed at [facility], [place] [address], [place], [place] [address]  Lactic acid, plasma     Status: Abnormal   Collection Time: 07/03/19  9:31 AM  Result Value Ref Range   Lactic Acid, Venous 2.6 (HH) 0.5 - 1.9 mmol/L    Comment: CRITICAL RESULT CALLED TO, READ BACK BY AND VERIFIED WITH: RN C DAVENPORT AT 1011 07/03/2019 BY L BENFIELD Performed at [facility], [place] [address] [place], [place] [address]   Lactic acid, plasma     Status: None   Collection Time: 07/03/19 12:13 PM  Result Value Ref Range   Lactic Acid, Venous 1.3 0.5 - 1.9 mmol/L    Comment: Performed at [place] [address], [place], [place] [address]  Lactic acid, plasma     Status: Abnormal   Collection Time: 07/03/19  3:56 PM  Result Value Ref Range   Lactic Acid, Venous 3.0 (HH) 0.5 - 1.9 mmol/L    Comment: CRITICAL VALUE NOTED.  VALUE IS CONSISTENT WITH PREVIOUSLY REPORTED AND CALLED VALUE. Performed at [facility], [place] [address] [place], [place] [address]   Comprehensive metabolic panel     Status: Abnormal   Collection Time: 07/04/19  1:00 AM  Result Value Ref Range   Sodium 145 135 - 145 mmol/L   Potassium 3.8 3.5 - 5.1 mmol/L   Chloride 119 (H) 98 - 111 mmol/L   CO2 21 (L) 22 - 32 mmol/L   Glucose, Bld 88 70 - 99 mg/dL   BUN 14 6 - 20 mg/dL   Creatinine, Ser 0.94  0.44 - 1.00 mg/dL   Calcium 11.7 (H) 8.9 - 10.3 mg/dL  Total Protein 4.5 (L) 6.5 - 8.1 g/dL   Albumin 2.1 (L) 3.5 - 5.0 g/dL   AST 19 15 - 41 U/L   ALT 15 0 - 44 U/L   Alkaline Phosphatase 53 38 - 126 U/L   Total Bilirubin 0.8 0.3 - 1.2 mg/dL   GFR calc non Af Amer >60 >60 mL/min   GFR calc Af Amer >60 >60 mL/min   Anion gap 5 5 - 15    Comment: Performed at [facility] [address], [place], [place] [address]  AM CBC     Status: Abnormal   Collection Time: 07/04/19  1:00 AM  Result Value Ref Range   WBC 6.6 4.0 - 10.5 K/uL   RBC 3.72 (L) 3.87 - 5.11 MIL/uL   Hemoglobin 11.6 (L) 12.0 - 15.0 g/dL   HCT 34.6 (L) 36.0 - 46.0 %   MCV 93.0 80.0 - 100.0 fL   MCH 31.2 26.0 - 34.0 pg   MCHC 33.5 30.0 - 36.0 g/dL   RDW 12.6 11.5 - 15.5 %   Platelets 177 150 - 400 K/uL   nRBC 0.0 0.0 - 0.2 %    Comment: Performed at [facility], [place] [address], [place], [place] [address]  Lactic acid, plasma     Status: None   Collection Time: 07/04/19  1:00 AM  Result Value Ref Range   Lactic Acid, Venous 1.4 0.5 - 1.9 mmol/L    Comment: Performed at [facility] [address] [place], [place] [address]    Radiology/Results: DG Abd 2 Views  Result Date: 07/03/2019 CLINICAL DATA:  Small bowel obstruction. EXAM: ABDOMEN - 2 VIEW COMPARISON:  One-view abdomen 07/02/2019 FINDINGS: The side port of the NG tube is in the stomach. Bowel distention is improving. No significant residual dilated loops of bowel are present. There is no free air. Lung bases are clear. IMPRESSION: 1. Improving small bowel obstruction. 2. No free air or significant residual dilated loops of bowel. Electronically Signed   By: San Morelle M.D.   On: 07/03/2019 19:12   DG Abd Portable 1V-Small Bowel Obstruction Protocol-initial, 8 hr delay  Result Date: 07/02/2019 CLINICAL DATA:  38 year old female with small bowel obstruction. EXAM: PORTABLE ABDOMEN - 1 VIEW COMPARISON:  Earlier radiograph dated  07/02/2019. FINDINGS: Enteric tube with tip in the midbody or distal stomach. Oral contrast is noted within multiple loops of small bowel in the left hemiabdomen. No passage of contrast identified to the colon on this images. Continued follow-up recommended. The small bowel loops measure up to 2.6 cm in caliber. There is an 8 mm right renal calculus. The osseous structures and soft tissues are unremarkable. IMPRESSION: 1. No passage of contrast to the colon at this time. Continued follow-up recommended. 2. Enteric tube with tip in the midbody or distal stomach. 3. Right renal calculus. Electronically Signed   By: Anner Crete M.D.   On: 07/02/2019 23:47   DG Abd Portable 1V-Small Bowel Protocol-Position Verification  Result Date: 07/02/2019 CLINICAL DATA:  NG tube placement. EXAM: PORTABLE ABDOMEN - 1 VIEW COMPARISON:  None. FINDINGS: An NG tube has been inserted and the tip is in the midbody. Moderate air in the colon. Lung bases are clear. No visible bone abnormality. IMPRESSION: The NG tube tip is in the mid body of the stomach. Electronically Signed   By: Lorriane Shire M.D.   On: 07/02/2019 14:01   CT Renal Stone Study  Addendum Date: 07/02/2019  ADDENDUM REPORT: 07/02/2019 09:16 ADDENDUM: The impression should say multiple dilated small bowel loops in the lower abdomen and pelvis. Findings are consistent with small bowel obstruction. Nonobstructing stone identified in the right kidney. Electronically Signed   By: Abelardo Diesel M.D.   On: 07/02/2019 09:16   Result Date: 07/02/2019 CLINICAL DATA:  Diffuse abdomen pain EXAM: CT ABDOMEN AND PELVIS WITHOUT CONTRAST TECHNIQUE: Multidetector CT imaging of the abdomen and pelvis was performed following the standard protocol without IV contrast. COMPARISON:  November 01, 2018 FINDINGS: Lower chest: No acute abnormality. Hepatobiliary: No focal liver abnormality is seen. No gallstones, gallbladder wall thickening, or biliary dilatation. Pancreas:  Unremarkable. No pancreatic ductal dilatation or surrounding inflammatory changes. Spleen: Normal in size without focal abnormality. Adrenals/Urinary Tract: There is a 7 mm stone probably nonobstructing in 1 of the right renal calyx. There is no hydronephrosis bilaterally. The bilateral adrenal glands are normal. The bladder is normal. Stomach/Bowel: There are multiple dilated small bowel loops in the lower abdomen and pelvis. The colon is normal. The stomach is normal. The appendix is not seen but no inflammation is noted around cecum. Vascular/Lymphatic: No significant vascular findings are present. No enlarged abdominal or pelvic lymph nodes. Reproductive: Uterus and bilateral adnexa are unremarkable. Other: Ascites is identified in the abdomen. Musculoskeletal: No acute abnormality is identified. IMPRESSION: Multiple dilated small bowel loops in the lower abdomen and pelvis. The findings are consistent with small bowel obstruction. The patella is slightly superiorly located relative to the femoral tibial joint raising the question of superior subluxation. Electronically Signed: By: Abelardo Diesel M.D. On: 07/02/2019 08:41    Anti-infectives: Anti-infectives (From admission, onward)   Start     Dose/Rate Route Frequency Ordered Stop   07/03/19 1400  piperacillin-tazobactam (ZOSYN) IVPB 3.375 g     3.375 g 12.5 mL/hr over 240 Minutes Intravenous Every 8 hours 07/03/19 1005     07/03/19 1015  piperacillin-tazobactam (ZOSYN) IVPB 3.375 g  Status:  Discontinued     3.375 g 12.5 mL/hr over 240 Minutes Intravenous Every 8 hours 07/03/19 1005 07/03/19 1005   07/03/19 0900  cefTRIAXone (ROCEPHIN) 1 g in sodium chloride 0.9 % 100 mL IVPB     1 g 200 mL/hr over 30 Minutes Intravenous  Once 07/02/19 1351 07/03/19 0936   07/02/19 0830  cefTRIAXone (ROCEPHIN) 1 g in sodium chloride 0.9 % 100 mL IVPB     1 g 200 mL/hr over 30 Minutes Intravenous  Once 07/02/19 8264 07/02/19 1117       Assessment/Plan: Problem List: Patient Active Problem List   Diagnosis Date Noted  . Small bowel obstruction ([place]) 07/02/2019  . Urinary tract infection 07/02/2019  . SBO (small bowel obstruction) ([place]) 07/02/2019  . Supervision of high risk pregnancy, antepartum 05/17/2019  . MDD (major depressive disorder), recurrent, severe, with psychosis ([place]) 10/06/2018  . Muscle cramps 02/02/2017  . Bipolar disorder, current episode mixed, moderate ([place]) 02/02/2017  . Generalized abdominal pain 02/02/2017  . Generalized anxiety disorder 02/02/2017  . Essential hypertension 02/02/2017    Xray pending.  Will give enema and see how effective the NG is at reducing abdominal contents.  * No surgery found *    LOS: 2 days   Matt B. Hassell Done, MD, [facility] [phone] beeper [phone]  07/04/2019 8:28 AM

## 2019-07-04 NOTE — Progress Notes (Signed)
Subjective:   Patient evaluated at bedside on rounds.  Theresa Mccoy reports she continues to have generalized abdominal pain that is unchanged in nature but worsening.  In addition she feels dehydrated but notes she has increased urination.  She is aware she is receiving IV fluids.  Denies chest pain, shortness of breath.  All questions and concerns addressed.  Objective:  Vital signs in last 24 hours: Vitals:   07/04/19 0411 07/04/19 0514 07/04/19 0602 07/04/19 0701  BP: (!) 135/92 137/89 (!) 132/91 (!) 125/93  Pulse: (!) 121 (!) 120 (!) 119 (!) 118  Resp: (!) 22 20 20 20   Temp: 98.5 F (36.9 C) 99.2 F (37.3 C) 98.2 F (36.8 C) 97.8 F (36.6 C)  TempSrc: Oral Axillary Oral Axillary  SpO2: 99% 99% 98% 97%  Weight:      Height:        Physical Exam Vitals and nursing note reviewed.  Constitutional:      General: She is not in acute distress.    Appearance: She is normal weight.  HENT:     Mouth/Throat:     Mouth: Mucous membranes are dry. No oral lesions.     Dentition: Dental caries present.     Pharynx: Oropharynx is clear.  Eyes:     Extraocular Movements: Extraocular movements intact.     Conjunctiva/sclera: Conjunctivae normal.  Cardiovascular:     Rate and Rhythm: Regular rhythm. Tachycardia present.     Heart sounds: No murmur.  Pulmonary:     Effort: Pulmonary effort is normal. No respiratory distress.     Breath sounds: Normal breath sounds.  Abdominal:     General: Bowel sounds are absent. There is distension.     Palpations: Abdomen is soft.     Tenderness: There is generalized abdominal tenderness.  Skin:    General: Skin is warm and dry.  Neurological:     General: No focal deficit present.     Mental Status: She is alert and oriented to person, place, and time.  Psychiatric:        Mood and Affect: Mood normal.        Behavior: Behavior normal.    Assessment/Plan:  Active Problems:   Small bowel obstruction (HCC)   Urinary tract infection   SBO (small bowel obstruction) ([place])  Theresa Mccoy is a 38 year old female with past medical history of anemia, depression, hypothyroidism, PTSD who presented with 1 day history of abdominal pain and was found to have small bowel obstruction and a urinary tract infection.  # Small bowel obstruction:  Likely adhesive in origin given location in the setting of multiple C-sections and possible salpingectomy. Patient has been NPO with NGT placed since 12/26. Yesterday, patient had elevated lactic acid to 3.0 with significant leukocytosis of 23, symptomatic with tachycardia in the 130-140s. She received roughly 7 L of fluids and lactic acidosis resolved. KUB showed improving SBO. Zosyn empirically started on 12/27.   Today, her leukocytosis has resolved, however patient continues to be tachycardic. General surgery has evaluated patient and recommend continuing with non-surgical management at this time, including soap sud enema. If unsuccessful, recommended possibly a trial of water-soluble contrast. Will continue to work on sinus tachycardia with moderate IVF resuscitation and one time IV morphine.   - General surgery is on board and we appreciate their recommendations  - IVF maintenance: LR @ 150 cc/hr  - Zosyn, dosing per pharmacy - Tylenol 650 mg every 6 hours PRN for mild pain/fever -  Toradol 15 mg every 6 hours PRN for severe pain - Zofran 4 mg every 6 hours PRN - IV Morphine 2 mg once  - Soap suds enema   # Hypercalcemia, chronic:  Correct calcium today is 13.2. Appears to be a chronic problem per chart review. Work up was started in 2018, however lab work was not completed. Given that this may be contributing to patient's clinical picture, will initiate work up now.   - PTH and PTHrp ordered - TSH ordered - CMP daily   # Urinary tract infection:  Urinalysis revealed moderate leukocytes, greater than 50 white blood cells, many bacteria; patient reported pain with urination  initially. Urine culture shows >= 100,000 colonies GNR, speciated as E. Coli that is sensitive to Zosyn. No symptoms reported today.   # AKI 2/2 hypovolemia: Resolved   Dispo: Anticipated discharge pending clinical improvement.   Dr. Jose Persia Internal Medicine PGY-1  Pager: [phone] 07/04/2019, 7:35 AM

## 2019-07-04 NOTE — Progress Notes (Signed)
Soap suds enema given with very minimal results.

## 2019-07-05 ENCOUNTER — Inpatient Hospital Stay (HOSPITAL_COMMUNITY): Payer: Medicaid Other

## 2019-07-05 LAB — CBC WITH DIFFERENTIAL/PLATELET
Abs Immature Granulocytes: 0.06 10*3/uL (ref 0.00–0.07)
Basophils Absolute: 0 10*3/uL (ref 0.0–0.1)
Basophils Relative: 0 %
Eosinophils Absolute: 0 10*3/uL (ref 0.0–0.5)
Eosinophils Relative: 0 %
HCT: 32.1 % — ABNORMAL LOW (ref 36.0–46.0)
Hemoglobin: 10.9 g/dL — ABNORMAL LOW (ref 12.0–15.0)
Immature Granulocytes: 1 %
Lymphocytes Relative: 5 %
Lymphs Abs: 0.6 10*3/uL — ABNORMAL LOW (ref 0.7–4.0)
MCH: 31.2 pg (ref 26.0–34.0)
MCHC: 34 g/dL (ref 30.0–36.0)
MCV: 92 fL (ref 80.0–100.0)
Monocytes Absolute: 0.8 10*3/uL (ref 0.1–1.0)
Monocytes Relative: 6 %
Neutro Abs: 11 10*3/uL — ABNORMAL HIGH (ref 1.7–7.7)
Neutrophils Relative %: 88 %
Platelets: 192 10*3/uL (ref 150–400)
RBC: 3.49 MIL/uL — ABNORMAL LOW (ref 3.87–5.11)
RDW: 12.8 % (ref 11.5–15.5)
WBC: 12.4 10*3/uL — ABNORMAL HIGH (ref 4.0–10.5)
nRBC: 0 % (ref 0.0–0.2)

## 2019-07-05 LAB — COMPREHENSIVE METABOLIC PANEL
ALT: 15 U/L (ref 0–44)
AST: 22 U/L (ref 15–41)
Albumin: 1.9 g/dL — ABNORMAL LOW (ref 3.5–5.0)
Alkaline Phosphatase: 61 U/L (ref 38–126)
Anion gap: 8 (ref 5–15)
BUN: 13 mg/dL (ref 6–20)
CO2: 21 mmol/L — ABNORMAL LOW (ref 22–32)
Calcium: 12.3 mg/dL — ABNORMAL HIGH (ref 8.9–10.3)
Chloride: 115 mmol/L — ABNORMAL HIGH (ref 98–111)
Creatinine, Ser: 0.88 mg/dL (ref 0.44–1.00)
GFR calc Af Amer: >60 mL/min (ref >60)
GFR calc non Af Amer: >60 mL/min (ref >60)
Glucose, Bld: 76 mg/dL (ref 70–99)
Potassium: 3.4 mmol/L — ABNORMAL LOW (ref 3.5–5.1)
Sodium: 144 mmol/L (ref 135–145)
Total Bilirubin: 1.3 mg/dL — ABNORMAL HIGH (ref 0.3–1.2)
Total Protein: 4.6 g/dL — ABNORMAL LOW (ref 6.5–8.1)

## 2019-07-05 LAB — IRON AND TIBC
Iron: 7 ug/dL — ABNORMAL LOW (ref 28–170)
Saturation Ratios: 5 % — ABNORMAL LOW (ref 10.4–31.8)
TIBC: 139 ug/dL — ABNORMAL LOW (ref 250–450)
UIBC: 132 ug/dL

## 2019-07-05 LAB — PARATHYROID HORMONE, INTACT (NO CA): PTH: 112 pg/mL — ABNORMAL HIGH (ref 15–65)

## 2019-07-05 LAB — VITAMIN B12: Vitamin B-12: 329 pg/mL (ref 180–914)

## 2019-07-05 LAB — FERRITIN: Ferritin: 288 ng/mL (ref 11–307)

## 2019-07-05 MED ORDER — ZOLEDRONIC ACID 4 MG/5ML IV CONC
4.0000 mg | Freq: Once | INTRAVENOUS | Status: DC
  Filled 2019-07-05: qty 5

## 2019-07-05 MED ORDER — SODIUM CHLORIDE 0.9 % IV SOLN
60.0000 mg | Freq: Once | INTRAVENOUS | Status: AC
  Administered 2019-07-05: 15:00:00 60 mg via INTRAVENOUS
  Filled 2019-07-05: qty 20

## 2019-07-05 NOTE — Progress Notes (Signed)
Patient ID: Theresa Mccoy, female   DOB: 15-May-1981, 38 y.o.   MRN: [id number] [facility] Progress Note:   * No surgery found *  Subjective: Mental status is sleeping and subdued Objective: Vital signs in last 24 hours: Temp:  [97.8 F (36.6 C)-98.6 F (37 C)] 98.3 F (36.8 C) (12/29 0352) Pulse Rate:  [82-121] 82 (12/29 0352) Resp:  [20] 20 (12/29 0352) BP: (118-124)/(83-86) 120/86 (12/29 0352) SpO2:  [96 %-99 %] 97 % (12/29 0352)  Intake/Output from previous day: 12/28 0701 - 12/29 0700 In: 3201.2 [I.V.:3011.2; NG/GT:30; IV Piggyback:160] Out: 1100 [Urine:500; Emesis/NG output:600] Intake/Output this shift: No intake/output data recorded.  Physical Exam: Work of breathing is not labored;  NG in place;  Patient grimacing with exam.    Lab Results:  Results for orders placed or performed during the hospital encounter of 07/01/19 (from the past 48 hour(s))  Lactic acid, plasma     Status: None   Collection Time: 07/03/19 12:13 PM  Result Value Ref Range   Lactic Acid, Venous 1.3 0.5 - 1.9 mmol/L    Comment: Performed at [facility], [address], [place], [place] [address]  Lactic acid, plasma     Status: Abnormal   Collection Time: 07/03/19  3:56 PM  Result Value Ref Range   Lactic Acid, Venous 3.0 (HH) 0.5 - 1.9 mmol/L    Comment: CRITICAL VALUE NOTED.  VALUE IS CONSISTENT WITH PREVIOUSLY REPORTED AND CALLED VALUE. Performed at [facility], [place] [address] [place], [place] [address]   Comprehensive metabolic panel     Status: Abnormal   Collection Time: 07/04/19  1:00 AM  Result Value Ref Range   Sodium 145 135 - 145 mmol/L   Potassium 3.8 3.5 - 5.1 mmol/L   Chloride 119 (H) 98 - 111 mmol/L   CO2 21 (L) 22 - 32 mmol/L   Glucose, Bld 88 70 - 99 mg/dL   BUN 14 6 - 20 mg/dL   Creatinine, Ser 0.94 0.44 - 1.00 mg/dL   Calcium 11.7 (H) 8.9 - 10.3 mg/dL   Total Protein 4.5 (L) 6.5 - 8.1 g/dL   Albumin 2.1 (L) 3.5 - 5.0 g/dL   AST 19 15 - 41 U/L    ALT 15 0 - 44 U/L   Alkaline Phosphatase 53 38 - 126 U/L   Total Bilirubin 0.8 0.3 - 1.2 mg/dL   GFR calc non Af Amer >60 >60 mL/min   GFR calc Af Amer >60 >60 mL/min   Anion gap 5 5 - 15    Comment: Performed at [facility], [place] [address], [place], [place] [address]  AM CBC     Status: Abnormal   Collection Time: 07/04/19  1:00 AM  Result Value Ref Range   WBC 6.6 4.0 - 10.5 K/uL   RBC 3.72 (L) 3.87 - 5.11 MIL/uL   Hemoglobin 11.6 (L) 12.0 - 15.0 g/dL   HCT 34.6 (L) 36.0 - 46.0 %   MCV 93.0 80.0 - 100.0 fL   MCH 31.2 26.0 - 34.0 pg   MCHC 33.5 30.0 - 36.0 g/dL   RDW 12.6 11.5 - 15.5 %   Platelets 177 150 - 400 K/uL   nRBC 0.0 0.0 - 0.2 %    Comment: Performed at [facility], [place] [address], [place], [place] [address]  Lactic acid, plasma     Status: None   Collection Time: 07/04/19  1:00 AM  Result Value  Ref Range   Lactic Acid, Venous 1.4 0.5 - 1.9 mmol/L    Comment: Performed at [facility] [address] [place], [place] [address]  Parathyroid hormone, intact (no Ca)     Status: Abnormal   Collection Time: 07/04/19 11:09 AM  Result Value Ref Range   PTH 112 (H) 15 - 65 pg/mL    Comment: (NOTE) Performed At: [facility] [place], [place] [id number] Rush Farmer MD [id number]   TSH     Status: None   Collection Time: 07/04/19  3:00 PM  Result Value Ref Range   TSH 0.562 0.350 - 4.500 uIU/mL    Comment: Performed by a 3rd Generation assay with a functional sensitivity of <=0.01 uIU/mL. Performed at [facility], [place] [address] [place], [place] [address]   CBC with Differential/Platelet     Status: Abnormal   Collection Time: 07/05/19  1:45 AM  Result Value Ref Range   WBC 12.4 (H) 4.0 - 10.5 K/uL   RBC 3.49 (L) 3.87 - 5.11 MIL/uL   Hemoglobin 10.9 (L) 12.0 - 15.0 g/dL   HCT 32.1 (L) 36.0 - 46.0 %   MCV 92.0 80.0 - 100.0 fL   MCH 31.2 26.0 - 34.0 pg   MCHC 34.0 30.0 - 36.0 g/dL   RDW 12.8 11.5 - 15.5 %    Platelets 192 150 - 400 K/uL   nRBC 0.0 0.0 - 0.2 %   Neutrophils Relative % 88 %   Neutro Abs 11.0 (H) 1.7 - 7.7 K/uL   Lymphocytes Relative 5 %   Lymphs Abs 0.6 (L) 0.7 - 4.0 K/uL   Monocytes Relative 6 %   Monocytes Absolute 0.8 0.1 - 1.0 K/uL   Eosinophils Relative 0 %   Eosinophils Absolute 0.0 0.0 - 0.5 K/uL   Basophils Relative 0 %   Basophils Absolute 0.0 0.0 - 0.1 K/uL   Immature Granulocytes 1 %   Abs Immature Granulocytes 0.06 0.00 - 0.07 K/uL    Comment: Performed at [facility], [place] [address], [place], [place] [address]  Comprehensive metabolic panel     Status: Abnormal   Collection Time: 07/05/19  1:45 AM  Result Value Ref Range   Sodium 144 135 - 145 mmol/L   Potassium 3.4 (L) 3.5 - 5.1 mmol/L   Chloride 115 (H) 98 - 111 mmol/L   CO2 21 (L) 22 - 32 mmol/L   Glucose, Bld 76 70 - 99 mg/dL   BUN 13 6 - 20 mg/dL   Creatinine, Ser 0.88 0.44 - 1.00 mg/dL   Calcium 12.3 (H) 8.9 - 10.3 mg/dL   Total Protein 4.6 (L) 6.5 - 8.1 g/dL   Albumin 1.9 (L) 3.5 - 5.0 g/dL   AST 22 15 - 41 U/L   ALT 15 0 - 44 U/L   Alkaline Phosphatase 61 38 - 126 U/L   Total Bilirubin 1.3 (H) 0.3 - 1.2 mg/dL   GFR calc non Af Amer >60 >60 mL/min   GFR calc Af Amer >60 >60 mL/min   Anion gap 8 5 - 15    Comment: Performed at [facility], [place] [address] [place], [place] [address]    Radiology/Results: DG Abd 1 View  Result Date: 07/05/2019 CLINICAL DATA:  Small bowel obstruction EXAM: ABDOMEN - 1 VIEW COMPARISON:  07/02/2019 FINDINGS: NG tube remains in the stomach. Improved small bowel distension. Continued mild prominence of left abdominal small bowel loops. No  free air organomegaly. IMPRESSION: Improved small bowel obstruction pattern with continued mild prominence of left abdominal small bowel loops. Electronically Signed   By: Rolm Baptise M.D.   On: 07/05/2019 08:16   DG Abd 2 Views  Result Date: 07/04/2019 CLINICAL DATA:  Small bowel obstruction. EXAM: ABDOMEN - 2  VIEW COMPARISON:  07/03/2019 and CT 07/02/2019 FINDINGS: Again noted is a nasogastric tube in the mid abdomen and likely within the distal stomach body region. 1.1 cm calcification overlying the right kidney is compatible with a kidney stone. Few densities at the right lung base that could represent atelectasis or right pleural effusion. Few gas-filled loops of small bowel in the lower abdomen. There is gas and stool in the colon. No evidence for free air. IMPRESSION: 1. Persistent gas-filled loops of small bowel in the right lower abdomen. There continues to be gas in the rectum and colon. Findings have minimally changed from the previous examination and could represent a partial small bowel obstruction. 2. Densities at the right lung base that could represent atelectasis and cannot exclude a small pleural effusion. 3. Right renal calculus. Electronically Signed   By: Markus Daft M.D.   On: 07/04/2019 08:13   DG Abd 2 Views  Result Date: 07/03/2019 CLINICAL DATA:  Small bowel obstruction. EXAM: ABDOMEN - 2 VIEW COMPARISON:  One-view abdomen 07/02/2019 FINDINGS: The side port of the NG tube is in the stomach. Bowel distention is improving. No significant residual dilated loops of bowel are present. There is no free air. Lung bases are clear. IMPRESSION: 1. Improving small bowel obstruction. 2. No free air or significant residual dilated loops of bowel. Electronically Signed   By: San Morelle M.D.   On: 07/03/2019 19:12    Anti-infectives: Anti-infectives (From admission, onward)   Start     Dose/Rate Route Frequency Ordered Stop   07/03/19 1400  piperacillin-tazobactam (ZOSYN) IVPB 3.375 g     3.375 g 12.5 mL/hr over 240 Minutes Intravenous Every 8 hours 07/03/19 1005     07/03/19 1015  piperacillin-tazobactam (ZOSYN) IVPB 3.375 g  Status:  Discontinued     3.375 g 12.5 mL/hr over 240 Minutes Intravenous Every 8 hours 07/03/19 1005 07/03/19 1005   07/03/19 0900  cefTRIAXone (ROCEPHIN) 1 g in  sodium chloride 0.9 % 100 mL IVPB     1 g 200 mL/hr over 30 Minutes Intravenous  Once 07/02/19 1351 07/03/19 0936   07/02/19 0830  cefTRIAXone (ROCEPHIN) 1 g in sodium chloride 0.9 % 100 mL IVPB     1 g 200 mL/hr over 30 Minutes Intravenous  Once 07/02/19 [id number] 07/02/19 1117      Assessment/Plan: Problem List: Patient Active Problem List   Diagnosis Date Noted  . Small bowel obstruction ([place]) 07/02/2019  . Urinary tract infection 07/02/2019  . SBO (small bowel obstruction) ([place]) 07/02/2019  . Supervision of high risk pregnancy, antepartum 05/17/2019  . MDD (major depressive disorder), recurrent, severe, with psychosis ([place]) 10/06/2018  . Muscle cramps 02/02/2017  . Bipolar disorder, current episode mixed, moderate ([place]) 02/02/2017  . Generalized abdominal pain 02/02/2017  . Generalized anxiety disorder 02/02/2017  . Essential hypertension 02/02/2017    WBC up with increased neutrophils; xray better but exam more worrisome.  May need exploratory lap for SBO * No surgery found *    LOS: 3 days   Matt B. Hassell Done, MD, [facility] [phone] beeper [phone]  07/05/2019 10:40 AM

## 2019-07-05 NOTE — Progress Notes (Addendum)
Subjective:   Patient reports she is having continued abdominal pain today that is worsened. She denies fever or chills. She denies pain anywhere else. She is aware that she may require surgery in the near future.   We briefly discussed her calcium levels as well. She denies prior knowledge of hypercalcemia. Denies prior kidney stones.   Objective:  Vital signs in last 24 hours: Vitals:   07/04/19 1111 07/04/19 1521 07/04/19 1949 07/05/19 0352  BP: 124/83 122/86 118/83 120/86  Pulse: (!) 119 (!) 121 (!) 118 82  Resp: 20 20 20 20   Temp: 97.8 F (36.6 C) 98.4 F (36.9 C) 98.6 F (37 C) 98.3 F (36.8 C)  TempSrc: Oral Oral Oral Oral  SpO2: 98% 99% 96% 97%  Weight:      Height:        Physical Exam Vitals and nursing note reviewed.  Constitutional:      Appearance: She is normal weight.  Pulmonary:     Effort: Pulmonary effort is normal. No respiratory distress.  Abdominal:     General: Bowel sounds are absent. There is distension (more so than yesterday).     Palpations: Abdomen is rigid.     Tenderness: There is generalized abdominal tenderness.  Skin:    General: Skin is warm and dry.  Neurological:     General: No focal deficit present.     Mental Status: She is alert.  Psychiatric:        Mood and Affect: Mood normal.        Behavior: Behavior normal.    Assessment/Plan:  Active Problems:   Small bowel obstruction (HCC)   Urinary tract infection   SBO (small bowel obstruction) ([place])  Ms. Kymoni Griebel is a 38 year old female with past medical history of anemia, depression, hypothyroidism, PTSD who presented with 1 day history of abdominal pain and was found to have small bowel obstruction and a urinary tract infection.  # Small bowel obstruction: Likely adhesive in origin given location in the setting of multiple C-sections and possible salpingectomy. Patient has been NPO with NGT placed since 12/26. Zosyn empirically started on 12/27.   Given that  patient's abdomen is more distended today with a slight jump in WBC, surgery are considering ex lap sooner than later. Will continue to follow up their recommendations. Tachycardia has improved.   - General surgery is on board and we appreciate their recommendations  - IVF maintenance: LR @ 150 cc/hr  - Zosyn, dosing per pharmacy - Tylenol 650 mg every 6 hours PRN for mild pain/fever - Toradol 15 mg every 6 hours PRN for severe pain - Zofran 4 mg every 6 hours PRN - Telemetry   # Hypercalcemia, chronic:  PTH twice the upper normal limit with continued hypercalcemia supports primary hyperparathyroidism. Past imaging has noted kidney stones, so would consider this symptomatic. Surgery is aware and will discuss best time for parathyroidectomy. Unfortunately, calcium has not improved with aggressive IVF. Today, her correct calcium is 14.0. Given severity, will treat with Zoledronic acid.    - Zoledronic acid 4 mg IVPB - CMP daily  - Telemetry   # Acute Normocytic Anemia:  Hemoglobin has been down-trending since admission with today's hemoglobin being 10.9. Per chart review, it has been on the lower end before, but today's hgb is the lowest it has been. Likely a component of dilutional anemia, given she has received 8.5L of fluids in the past 3 days. Will obtain iron studies, [id number] and folic acid  levels.   - CBC daily  - Iron studies, [id number] and folic acid levels pending    # Urinary tract infection: Urinalysis revealed moderate leukocytes, greater than 50 white blood cells, many bacteria; patient reported pain with urination initially. Urine cultureshows >= 100,000 colonies GNR, speciated as E. Coli that is sensitive to Zosyn. No symptoms reported today.   # AKI 2/2 hypovolemia: Resolved   Dispo: Anticipated discharge pending clinical improvement.   Dr. Jose Persia Internal Medicine PGY-1  Pager: [phone] 07/05/2019, 6:45 AM

## 2019-07-06 LAB — CBC WITH DIFFERENTIAL/PLATELET
Abs Immature Granulocytes: 0.14 10*3/uL — ABNORMAL HIGH (ref 0.00–0.07)
Basophils Absolute: 0 10*3/uL (ref 0.0–0.1)
Basophils Relative: 0 %
Eosinophils Absolute: 0 10*3/uL (ref 0.0–0.5)
Eosinophils Relative: 0 %
HCT: 28.7 % — ABNORMAL LOW (ref 36.0–46.0)
Hemoglobin: 9.7 g/dL — ABNORMAL LOW (ref 12.0–15.0)
Immature Granulocytes: 1 %
Lymphocytes Relative: 7 %
Lymphs Abs: 0.9 10*3/uL (ref 0.7–4.0)
MCH: 30.9 pg (ref 26.0–34.0)
MCHC: 33.8 g/dL (ref 30.0–36.0)
MCV: 91.4 fL (ref 80.0–100.0)
Monocytes Absolute: 0.9 10*3/uL (ref 0.1–1.0)
Monocytes Relative: 7 %
Neutro Abs: 12 10*3/uL — ABNORMAL HIGH (ref 1.7–7.7)
Neutrophils Relative %: 85 %
Platelets: 214 10*3/uL (ref 150–400)
RBC: 3.14 MIL/uL — ABNORMAL LOW (ref 3.87–5.11)
RDW: 12.9 % (ref 11.5–15.5)
WBC: 14 10*3/uL — ABNORMAL HIGH (ref 4.0–10.5)
nRBC: 0 % (ref 0.0–0.2)

## 2019-07-06 LAB — COMPREHENSIVE METABOLIC PANEL
ALT: 18 U/L (ref 0–44)
AST: 35 U/L (ref 15–41)
Albumin: 1.8 g/dL — ABNORMAL LOW (ref 3.5–5.0)
Alkaline Phosphatase: 66 U/L (ref 38–126)
Anion gap: 8 (ref 5–15)
BUN: 13 mg/dL (ref 6–20)
CO2: 23 mmol/L (ref 22–32)
Calcium: 12.8 mg/dL — ABNORMAL HIGH (ref 8.9–10.3)
Chloride: 116 mmol/L — ABNORMAL HIGH (ref 98–111)
Creatinine, Ser: 0.84 mg/dL (ref 0.44–1.00)
GFR calc Af Amer: >60 mL/min (ref >60)
GFR calc non Af Amer: >60 mL/min (ref >60)
Glucose, Bld: 92 mg/dL (ref 70–99)
Potassium: 2.8 mmol/L — ABNORMAL LOW (ref 3.5–5.1)
Sodium: 147 mmol/L — ABNORMAL HIGH (ref 135–145)
Total Bilirubin: 1.2 mg/dL (ref 0.3–1.2)
Total Protein: 4.6 g/dL — ABNORMAL LOW (ref 6.5–8.1)

## 2019-07-06 MED ORDER — POTASSIUM CHLORIDE 10 MEQ/100ML IV SOLN
10.0000 meq | INTRAVENOUS | Status: AC
  Administered 2019-07-06 (×6): 10 meq via INTRAVENOUS
  Filled 2019-07-06 (×5): qty 100

## 2019-07-06 NOTE — Progress Notes (Signed)
Pharmacy Antibiotic Note  Theresa Mccoy is a 38 y.o. female admitted on 07/01/2019 with SBO and found to have UTI.  Pharmacy has been consulted for Zosyn dosing.  Day 5 abx, Day 4 Zosyn. Pt is afebrile, WBC trending up to 14. UCx growing pan-sensitive E. coli, could narrow abx to cefazolin for UTI indication but will keep Zosyn given empiric abx for SBO. Pt reports worsening abdominal pain. Surgery considering exploratory lap for SBO.  Plan: Zosyn 3.375g IV q8h (4 hour infusion).  Monitor clinical picture, renal function, abx LOT F/u surgery consult for SBO   Height: 5' (152.4 cm) Weight: 164 lb (74.4 kg) IBW/kg (Calculated) : 45.5  Temp (24hrs), Avg:98.4 F (36.9 C), Min:97.4 F (36.3 C), Max:99.1 F (37.3 C)  Recent Labs  Lab 07/02/19 1625 07/03/19 0411 07/03/19 0931 07/03/19 1213 07/03/19 1556 07/04/19 0100 07/05/19 0145 07/06/19 0325  WBC 23.5* 23.7*  --   --   --  6.6 12.4* 14.0*  CREATININE 0.86 1.15*  --   --   --  0.94 0.88 0.84  LATICACIDVEN  --   --  2.6* 1.3 3.0* 1.4  --   --     Estimated Creatinine Clearance: 81.9 mL/min (by C-G formula based on SCr of 0.84 mg/dL).    Allergies  Allergen Reactions  . Codeine Other (See Comments)    constipation    Antimicrobials this admission: 12/27 Zosyn>> 12/26 CTX >12/27  Microbiology results: 12/25 UCx: 100k E. coli, pan-sensitive   Berenice Bouton, PharmD PGY1 Pharmacy Resident  Please check AMION for all [facility] phone numbers After 10:00 PM, call [place] [phone]   07/06/2019   7:59 AM

## 2019-07-06 NOTE — Progress Notes (Addendum)
Subjective:   Theresa Mccoy states her stomach pain is not improving and she feels more bloated today than yesterday. She did have a bowel movement overnight and at the moment, feels she needs to urinate often. She is having pain in her chest area for the last few minutes that started when her RN flushed the IV. No other pain at this time.   Objective:  Vital signs in last 24 hours: Vitals:   07/05/19 0352 07/05/19 1534 07/05/19 2200 07/06/19 0617  BP: 120/86 (!) 143/83 123/73 (!) 141/84  Pulse: 82 68 74 69  Resp: 20 (!) 22 19 20   Temp: 98.3 F (36.8 C) (!) 97.4 F (36.3 C) 99.1 F (37.3 C) 98.6 F (37 C)  TempSrc: Oral Oral Oral Oral  SpO2: 97% 94% 96% 95%  Weight:      Height:        Physical Exam Vitals and nursing note reviewed.  Constitutional:      General: She is not in acute distress.    Appearance: She is normal weight.  Pulmonary:     Effort: Pulmonary effort is normal. No respiratory distress.  Abdominal:     General: Bowel sounds are absent. There is distension.     Palpations: Abdomen is rigid.     Tenderness: There is abdominal tenderness in the right upper quadrant and right lower quadrant. There is guarding.  Skin:    General: Skin is warm and dry.  Neurological:     General: No focal deficit present.     Mental Status: She is alert.    Assessment/Plan:  Active Problems:   Small bowel obstruction (HCC)   Urinary tract infection   SBO (small bowel obstruction) ([place])  [name] a 38 year old female with past medical history of anemia, depression, hypothyroidism, PTSD who presented with 1 day history of abdominal pain and was found to have small bowel obstruction and a urinary tract infection.  # Small bowel obstruction: Likely adhesive in origin given location inthesetting of multipleC-sections and possible salpingectomy.Patient has been NPO with NGT placed since 12/26. Zosyn empirically started on 12/27.   BM overnight is  reassuring. Will hold off on ex lap, per surgery's recommendations. Her tachycardia is also improved but she continues to have leukocytosis. Will need to monitor closely.   - General surgery is on board and we appreciate their recommendations  - IVF maintenance: LR @ 250 cc/hr  - Zosyn, dosing per pharmacy -Tylenol 650 mg every 6 hoursPRNfor mild pain/fever -Toradol 15 mg every 6 hoursPRNfor severe pain -Zofran 4 mg every 6 hoursPRN - Telemetry   # Hypercalcemia, chronic:  PTH twice the upper normal limit with continued hypercalcemia supports primary hyperparathyroidism. Calcium continues to increase despite biphosphonate yesterday. Due to this, may need parathyroidectomy sooner than later. She will need sestamibi scan to localize hyperactive portion. Discussed with Dr. Kieth Brightly who will order study and look into when surgery can be scheduled.    - CMP daily - Telemetry    # Normocytic Anemia:  Continues to slowly decrease. Anemia work up includes normal B12. Iron studies show low iron, TIBC and iron saturation consistent with anemia of chronic disease. Possibly due to primary hyperparathyroidism.    - CBC daily    # Urinary tract infection: Urinalysis revealed moderate leukocytes, greater than 50 white blood cells, many bacteria;patient reported pain with urination initially. Urine cultureshows >= 100,000 colonies GNR,speciated as E. Coli that is sensitive to Zosyn.No symptoms reported today.  #AKI 2/2  hypovolemia: Resolved   Dispo: Anticipated dischargepending clinical improvement.  Dr. Jose Persia Internal Medicine PGY-1  Pager: [phone] 07/06/2019, 8:11 AM

## 2019-07-06 NOTE — Progress Notes (Signed)
   07/06/19 2025  What Happened  Was fall witnessed? No  Was patient injured? 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 Court Joy  Time MD notified 2038  Family notified Yes - comment Vaughan Basta Taylor,(Mother))  Time family notified 2041  Progress note created (see row info) Yes  Adult Fall Risk Assessment  Risk Factor Category (scoring not indicated) Fall has occurred during this admission (document High fall risk)  Age 38  Fall History: Fall within 6 months prior to admission 0  Elimination; Bowel and/or Urine Incontinence 0  Elimination; Bowel and/or Urine Urgency/Frequency 0  Medications: includes PCA/Opiates, Anti-convulsants, Anti-hypertensives, Diuretics, Hypnotics, Laxatives, Sedatives, and Psychotropics 3  Patient Care Equipment 2  Mobility-Assistance 2  Mobility-Gait 2  Mobility-Sensory Deficit 0  Altered awareness of immediate physical environment 0  Impulsiveness 2  Lack of understanding of one's physical/cognitive limitations 4  Total Score 15  Patient Fall Risk Level High fall risk  Vitals  Temp 98.4 F (36.9 C)  Temp Source Oral  BP (!) 144/79  MAP (mmHg) 97  BP Location Left Arm  BP Method Automatic  Patient Position (if appropriate) Lying  Pulse Rate 76  Pulse Rate Source Monitor  Resp 20  Oxygen Therapy  SpO2 93 %  O2 Device Room Air  Pain Assessment  Pain Scale 0-10  Pain Score 0  Neurological  Neuro (WDL) WDL  Level of Consciousness Alert  Orientation Level Oriented X4  Musculoskeletal  Musculoskeletal (WDL) X  Assistive Device BSC  Generalized Weakness Yes  Integumentary  Integumentary (WDL) WDL

## 2019-07-06 NOTE — Progress Notes (Signed)
Progress Note: General Surgery Service   Chief Complaint/Subjective: 2 bowel movements overnight, +flatus, some abdominal pain today, better last night  Objective: Vital signs in last 24 hours: Temp:  [97.4 F (36.3 C)-99.1 F (37.3 C)] 98.6 F (37 C) (12/30 0617) Pulse Rate:  [68-74] 69 (12/30 0617) Resp:  [19-22] 20 (12/30 0617) BP: (123-143)/(73-84) 141/84 (12/30 0617) SpO2:  [94 %-96 %] 95 % (12/30 0617) Last BM Date: 07/05/19  Intake/Output from previous day: 12/29 0701 - 12/30 0700 In: 4234.2 [P.O.:360; I.V.:3444.3; NG/GT:30; IV Piggyback:400] Out: 1450 [Urine:200; Emesis/NG output:1250] Intake/Output this shift: Total I/O In: 0  Out: 200 [Urine:200]  Gen: NAD  Resp: nonlabored  Card: RRR  Abd: soft, moderate distension, nontender  Lab Results: CBC  Recent Labs    07/05/19 0145 07/06/19 0325  WBC 12.4* 14.0*  HGB 10.9* 9.7*  HCT 32.1* 28.7*  PLT 192 214   BMET Recent Labs    07/05/19 0145 07/06/19 0325  NA 144 147*  K 3.4* 2.8*  CL 115* 116*  CO2 21* 23  GLUCOSE 76 92  BUN 13 13  CREATININE 0.88 0.84  CALCIUM 12.3* 12.8*   PT/INR No results for input(s): LABPROT, INR in the last 72 hours. ABG No results for input(s): PHART, HCO3 in the last 72 hours.  Invalid input(s): PCO2, PO2  Anti-infectives: Anti-infectives (From admission, onward)   Start     Dose/Rate Route Frequency Ordered Stop   07/03/19 1400  piperacillin-tazobactam (ZOSYN) IVPB 3.375 g     3.375 g 12.5 mL/hr over 240 Minutes Intravenous Every 8 hours 07/03/19 1005     07/03/19 1015  piperacillin-tazobactam (ZOSYN) IVPB 3.375 g  Status:  Discontinued     3.375 g 12.5 mL/hr over 240 Minutes Intravenous Every 8 hours 07/03/19 1005 07/03/19 1005   07/03/19 0900  cefTRIAXone (ROCEPHIN) 1 g in sodium chloride 0.9 % 100 mL IVPB     1 g 200 mL/hr over 30 Minutes Intravenous  Once 07/02/19 1351 07/03/19 0936   07/02/19 0830  cefTRIAXone (ROCEPHIN) 1 g in sodium chloride 0.9 % 100  mL IVPB     1 g 200 mL/hr over 30 Minutes Intravenous  Once 07/02/19 0752 07/02/19 1117      Medications: Scheduled Meds: . enoxaparin (LOVENOX) injection  40 mg Subcutaneous Q24H  . sodium chloride flush  3 mL Intravenous Q12H   Continuous Infusions: . lactated ringers 250 mL/hr at 07/06/19 0855  . piperacillin-tazobactam (ZOSYN)  IV 3.375 g (07/06/19 0500)  . potassium chloride 10 mEq (07/06/19 0858)   PRN Meds:.acetaminophen (TYLENOL) oral liquid 160 mg/5 mL, ketorolac, ondansetron (ZOFRAN) IV  Assessment/Plan: 38 yo female with small bowel obstruction -air and stool in colon, XR with persistent dilated small bowel -will continue NG compression today due to green consistency and persistent XR findings -clinically improving with BMs and flatus  Hypercalcemia -PTH elevated -will need follow up with [facility] or    LOS: 4 days   Mickeal Skinner, MD [facility]

## 2019-07-07 ENCOUNTER — Inpatient Hospital Stay (HOSPITAL_COMMUNITY): Payer: Medicaid Other

## 2019-07-07 ENCOUNTER — Other Ambulatory Visit (HOSPITAL_COMMUNITY): Payer: Medicaid Other

## 2019-07-07 DIAGNOSIS — E876 Hypokalemia: Secondary | ICD-10-CM

## 2019-07-07 DIAGNOSIS — K56609 Unspecified intestinal obstruction, unspecified as to partial versus complete obstruction: Secondary | ICD-10-CM

## 2019-07-07 DIAGNOSIS — E210 Primary hyperparathyroidism: Secondary | ICD-10-CM | POA: Diagnosis present

## 2019-07-07 HISTORY — DX: Unspecified intestinal obstruction, unspecified as to partial versus complete obstruction: K56.609

## 2019-07-07 LAB — COMPREHENSIVE METABOLIC PANEL
ALT: 18 U/L (ref 0–44)
AST: 23 U/L (ref 15–41)
Albumin: 1.7 g/dL — ABNORMAL LOW (ref 3.5–5.0)
Alkaline Phosphatase: 59 U/L (ref 38–126)
Anion gap: 7 (ref 5–15)
BUN: 11 mg/dL (ref 6–20)
CO2: 24 mmol/L (ref 22–32)
Calcium: 11.3 mg/dL — ABNORMAL HIGH (ref 8.9–10.3)
Chloride: 114 mmol/L — ABNORMAL HIGH (ref 98–111)
Creatinine, Ser: 0.75 mg/dL (ref 0.44–1.00)
GFR calc Af Amer: >60 mL/min (ref >60)
GFR calc non Af Amer: >60 mL/min (ref >60)
Glucose, Bld: 90 mg/dL (ref 70–99)
Potassium: 2.9 mmol/L — ABNORMAL LOW (ref 3.5–5.1)
Sodium: 145 mmol/L (ref 135–145)
Total Bilirubin: 1.2 mg/dL (ref 0.3–1.2)
Total Protein: 4.6 g/dL — ABNORMAL LOW (ref 6.5–8.1)

## 2019-07-07 LAB — PREGNANCY, URINE: Preg Test, Ur: NEGATIVE

## 2019-07-07 LAB — BASIC METABOLIC PANEL
Anion gap: 7 (ref 5–15)
BUN: 12 mg/dL (ref 6–20)
CO2: 23 mmol/L (ref 22–32)
Calcium: 11 mg/dL — ABNORMAL HIGH (ref 8.9–10.3)
Chloride: 114 mmol/L — ABNORMAL HIGH (ref 98–111)
Creatinine, Ser: 0.73 mg/dL (ref 0.44–1.00)
GFR calc Af Amer: >60 mL/min (ref >60)
GFR calc non Af Amer: >60 mL/min (ref >60)
Glucose, Bld: 86 mg/dL (ref 70–99)
Potassium: 3 mmol/L — ABNORMAL LOW (ref 3.5–5.1)
Sodium: 144 mmol/L (ref 135–145)

## 2019-07-07 LAB — CBC WITH DIFFERENTIAL/PLATELET
Abs Immature Granulocytes: 0.16 10*3/uL — ABNORMAL HIGH (ref 0.00–0.07)
Basophils Absolute: 0 10*3/uL (ref 0.0–0.1)
Basophils Relative: 0 %
Eosinophils Absolute: 0.1 10*3/uL (ref 0.0–0.5)
Eosinophils Relative: 1 %
HCT: 26.8 % — ABNORMAL LOW (ref 36.0–46.0)
Hemoglobin: 9.2 g/dL — ABNORMAL LOW (ref 12.0–15.0)
Immature Granulocytes: 2 %
Lymphocytes Relative: 10 %
Lymphs Abs: 1 10*3/uL (ref 0.7–4.0)
MCH: 31 pg (ref 26.0–34.0)
MCHC: 34.3 g/dL (ref 30.0–36.0)
MCV: 90.2 fL (ref 80.0–100.0)
Monocytes Absolute: 0.9 10*3/uL (ref 0.1–1.0)
Monocytes Relative: 9 %
Neutro Abs: 8.1 10*3/uL — ABNORMAL HIGH (ref 1.7–7.7)
Neutrophils Relative %: 78 %
Platelets: 201 10*3/uL (ref 150–400)
RBC: 2.97 MIL/uL — ABNORMAL LOW (ref 3.87–5.11)
RDW: 13 % (ref 11.5–15.5)
WBC: 10.2 10*3/uL (ref 4.0–10.5)
nRBC: 0 % (ref 0.0–0.2)

## 2019-07-07 LAB — CBC
HCT: 31.5 % — ABNORMAL LOW (ref 36.0–46.0)
Hemoglobin: 10.8 g/dL — ABNORMAL LOW (ref 12.0–15.0)
MCH: 31.4 pg (ref 26.0–34.0)
MCHC: 34.3 g/dL (ref 30.0–36.0)
MCV: 91.6 fL (ref 80.0–100.0)
Platelets: 210 10*3/uL (ref 150–400)
RBC: 3.44 MIL/uL — ABNORMAL LOW (ref 3.87–5.11)
RDW: 13.2 % (ref 11.5–15.5)
WBC: 11.3 10*3/uL — ABNORMAL HIGH (ref 4.0–10.5)
nRBC: 0 % (ref 0.0–0.2)

## 2019-07-07 LAB — FOLATE RBC
Folate, Hemolysate: 429 ng/mL
Folate, RBC: 1366 ng/mL (ref >498)
Hematocrit: 31.4 % — ABNORMAL LOW (ref 34.0–46.6)

## 2019-07-07 LAB — LACTIC ACID, PLASMA
Lactic Acid, Venous: 1 mmol/L (ref 0.5–1.9)
Lactic Acid, Venous: 1.1 mmol/L (ref 0.5–1.9)

## 2019-07-07 MED ORDER — POTASSIUM CHLORIDE 10 MEQ/100ML IV SOLN
10.0000 meq | INTRAVENOUS | Status: AC
  Administered 2019-07-07 – 2019-07-08 (×6): 10 meq via INTRAVENOUS
  Filled 2019-07-07 (×7): qty 100

## 2019-07-07 MED ORDER — ACETAMINOPHEN 10 MG/ML IV SOLN
1000.0000 mg | Freq: Once | INTRAVENOUS | Status: AC
  Administered 2019-07-07: 1000 mg via INTRAVENOUS
  Filled 2019-07-07: qty 100

## 2019-07-07 MED ORDER — POTASSIUM CHLORIDE 10 MEQ/100ML IV SOLN
10.0000 meq | INTRAVENOUS | Status: AC
  Administered 2019-07-07 (×2): 10 meq via INTRAVENOUS
  Filled 2019-07-07 (×2): qty 100

## 2019-07-07 MED ORDER — KETOROLAC TROMETHAMINE 15 MG/ML IJ SOLN
15.0000 mg | Freq: Four times a day (QID) | INTRAMUSCULAR | Status: AC | PRN
  Administered 2019-07-08 – 2019-07-10 (×6): 15 mg via INTRAVENOUS
  Filled 2019-07-07 (×6): qty 1

## 2019-07-07 NOTE — Progress Notes (Signed)
Patient ID: Theresa Mccoy, female   DOB: 06-18-1981, 38 y.o.   MRN: [id number] Called to see patient after fever. She reports she has passed some gas. ABD xrays done show SBO but no free air. I found her NGT to be very far out. I advanced it and got about 900cc of bile out. ABD is distended with some upper abdominal tenderness. Better after fixing NGT. WBC 11k, lactate 1.0. Will continue to observe for now and re-assess in AM for any improvement. Otherwise may need surgery. I discussed this with her.  Georganna Skeans, MD, MPH, FACS Please use [email or web address] to contact on call provider

## 2019-07-07 NOTE — Progress Notes (Addendum)
Progress Note: General Surgery Service   Chief Complaint/Subjective: No further BMs or flatus, worsening abd pain  Objective: Vital signs in last 24 hours: Temp:  [97.6 F (36.4 C)-98.7 F (37.1 C)] 98.7 F (37.1 C) (12/30 2039) Pulse Rate:  [64-77] 77 (12/30 2039) Resp:  [19-20] 20 (12/30 2039) BP: (142-144)/(79-87) 142/79 (12/30 2039) SpO2:  [93 %-95 %] 93 % (12/30 2039) Last BM Date: 07/05/19  Intake/Output from previous day: 12/30 0701 - 12/31 0700 In: 6385.6 [I.V.:5635.6; IV Piggyback:750] Out: 1050 [Urine:750; Emesis/NG output:300] Intake/Output this shift: No intake/output data recorded.  Gen: NAD  Resp: nonlabored  Card: RRR  Abd: soft, moderate distension, mildly tender mid abd  Lab Results: CBC  Recent Labs    07/06/19 0325 07/07/19 0355  WBC 14.0* 10.2  HGB 9.7* 9.2*  HCT 28.7* 26.8*  PLT 214 201   BMET Recent Labs    07/06/19 0325 07/07/19 0355  NA 147* 145  K 2.8* 2.9*  CL 116* 114*  CO2 23 24  GLUCOSE 92 90  BUN 13 11  CREATININE 0.84 0.75  CALCIUM 12.8* 11.3*   PT/INR No results for input(s): LABPROT, INR in the last 72 hours. ABG No results for input(s): PHART, HCO3 in the last 72 hours.  Invalid input(s): PCO2, PO2  Anti-infectives: Anti-infectives (From admission, onward)   Start     Dose/Rate Route Frequency Ordered Stop   07/03/19 1400  piperacillin-tazobactam (ZOSYN) IVPB 3.375 g     3.375 g 12.5 mL/hr over 240 Minutes Intravenous Every 8 hours 07/03/19 1005     07/03/19 1015  piperacillin-tazobactam (ZOSYN) IVPB 3.375 g  Status:  Discontinued     3.375 g 12.5 mL/hr over 240 Minutes Intravenous Every 8 hours 07/03/19 1005 07/03/19 1005   07/03/19 0900  cefTRIAXone (ROCEPHIN) 1 g in sodium chloride 0.9 % 100 mL IVPB     1 g 200 mL/hr over 30 Minutes Intravenous  Once 07/02/19 1351 07/03/19 0936   07/02/19 0830  cefTRIAXone (ROCEPHIN) 1 g in sodium chloride 0.9 % 100 mL IVPB     1 g 200 mL/hr over 30 Minutes Intravenous   Once 07/02/19 0752 07/02/19 1117      Medications: Scheduled Meds: . enoxaparin (LOVENOX) injection  40 mg Subcutaneous Q24H  . sodium chloride flush  3 mL Intravenous Q12H   Continuous Infusions: . lactated ringers 250 mL/hr at 07/07/19 0630  . piperacillin-tazobactam (ZOSYN)  IV 3.375 g (07/07/19 0507)  . potassium chloride     PRN Meds:.acetaminophen (TYLENOL) oral liquid 160 mg/5 mL, ketorolac, ondansetron (ZOFRAN) IV  Assessment/Plan: 38 yo female with small bowel obstruction  XR worse today with persistent dilated small bowel, unable to visualize NG tip, advance NG and recheck film -will continue NG decompression today due to green consistency and persistent XR findings, may need surgical exploration, will try advancing NG and electrolyte replacement first Hypokalemia: needs replacement Hypercalcemia -PTH elevated -will need follow up with Gerkin or Endocrinology   LOS: 5 days   Rosario Adie, [place] [id number] [phone] [facility]

## 2019-07-07 NOTE — Progress Notes (Signed)
Informed by patient's RN Hinton Dyer that patient has developed a fever of 100.6 and tachycardia up to the 120s.   Evaluated patient at bedside. She endorses worsening pain and distention since this AM.   Physical Exam Vitals reviewed.  Constitutional:      General: She is in acute distress.  Cardiovascular:     Rate and Rhythm: Regular rhythm. Tachycardia present.  Pulmonary:     Effort: Tachypnea present.  Abdominal:     General: Bowel sounds are absent. There is distension.     Palpations: Abdomen is rigid.     Tenderness: There is abdominal tenderness (severe, worsened). There is guarding.     Comments: Very rigid in comparison to this morning's examination  Skin:    Comments: Very warm to touch  Neurological:     General: No focal deficit present.     Mental Status: She is alert and oriented to person, place, and time.  Psychiatric:        Mood and Affect: Mood normal.        Behavior: Behavior normal.     Assessment + Plan:   Concerned for bowel perforation vs ischemia given 5 day history of SBO. Temperature rechecked after examination and had increased to 102.2.   - Contacted surgery for urgent re-evaluation  - Increased IVF to 250 cc/hr  - CBC and lactic acid ordered - Stat KUB ordered  - Tylenol IV 1000 mg    Dr. Jose Persia Internal Medicine PGY-1  Pager: [phone] 07/07/2019, 4:39 PM

## 2019-07-07 NOTE — Progress Notes (Addendum)
Subjective:   Theresa Mccoy reports continued abdominal pain that is worsening. She denies any flatus or BM in the past 24 hours. She is interesting in eating though and asking when she can have ice cream.   Discussed plan to continue NPO with bowel rest, as well as imaging of parathyroid. All questions and concerns addressed.   Objective:  Vital signs in last 24 hours: Vitals:   07/06/19 0617 07/06/19 1406 07/06/19 2025 07/06/19 2039  BP: (!) 141/84 (!) 144/87 (!) 144/79 (!) 142/79  Pulse: 69 64 76 77  Resp: 20 19 20 20   Temp: 98.6 F (37 C) 97.6 F (36.4 C) 98.4 F (36.9 C) 98.7 F (37.1 C)  TempSrc: Oral Oral Oral Oral  SpO2: 95% 95% 93% 93%  Weight:      Height:        Physical Exam Vitals and nursing note reviewed.  Constitutional:      General: She is not in acute distress. Abdominal:     General: There is distension.     Palpations: Abdomen is rigid.     Tenderness: There is generalized abdominal tenderness.     Comments: More rigid than yesterday, with increased distention as well. Tenderness is unchanged  Skin:    General: Skin is warm and dry.  Neurological:     Mental Status: She is alert.    Assessment/Plan:  Active Problems:   Small bowel obstruction (HCC)   Urinary tract infection   SBO (small bowel obstruction) ([place])  Theresa Mccoy a 38 year old female with past medical history of anemia, depression, hypothyroidism, PTSD who presented with 1 day history of abdominal pain and was found to have small bowel obstruction and a urinary tract infection.  # Small bowel obstruction: Likely adhesive in origin given location inthesetting of multipleC-sections and possible salpingectomy.Patient has been NPO with NGT placed since 12/26. Zosyn empirically started on 12/27.   KUB today showed worsening /continued SBO with patient also being more rigid and distended on examination. Her leukocytosis has improved, as has her HR, however concerning that  it is day 5 without significant improvements. Surgery considering ex lap. She has been NPO during entirety of admission (5 days) and will need to consider TPN in the next day or two.   - General surgery is on board and we appreciate their recommendations  - IVF maintenance: LR @ 125 cc/hr  - Zosyn, dosing per pharmacy -Tylenol 650 mg every 6 hoursPRNfor mild pain/fever -Toradol 15 mg every 6 hoursPRNfor severe pain -Zofran 4 mg every 6 hoursPRN - Telemetry - KUB tomorrow AM  # Primary Hyperparathyroidism: # Hypercalcemia:  Awaiting sestamibi imaging. Her calcemia has improved today since receiving bisphosphonate 2 days ago.   - CMP daily - Telemetry   # Hypokalemia  Second day of hypokalemia, likely secondary to GI losses. Received 33mEq IV yesterday without improvement in potassium levels. Plan to replenish and recheck labs this afternoon.   - 60 mEq potassium chloride IV  - BMP @ 1500  # NormocyticAnemia:  Workup indicated anemia of chronic disease. Monitoring closely since it has steadily decreased during admission. Acute bleed is lower on the differential though as vital signs stable. Possibly dilutional given aggressive IVF.     -CBC daily   # Urinary tract infection: Urinalysis revealed moderate leukocytes, greater than 50 white blood cells, many bacteria;patient reported pain with urination initially. Urine cultureshows >= 100,000 colonies GNR,speciated as E. Coli that is sensitive to Zosyn.No symptoms reported today.  #  AKI 2/2 hypovolemia: Resolved   Dispo: Anticipated dischargepending clinical improvement.  Dr. Jose Persia Internal Medicine PGY-1  Pager: [phone] 07/07/2019, 7:38 AM

## 2019-07-07 NOTE — Plan of Care (Signed)
  Problem: Coping: Goal: Level of anxiety will decrease Outcome: Progressing   Problem: Pain Managment: Goal: General experience of comfort will improve Outcome: Progressing   Problem: Skin Integrity: Goal: Risk for impaired skin integrity will decrease Outcome: Progressing   

## 2019-07-08 ENCOUNTER — Encounter (HOSPITAL_COMMUNITY): Admission: AD | Disposition: A | Discharge status: Home Health | Payer: Self-pay | Source: Home / Self Care

## 2019-07-08 ENCOUNTER — Inpatient Hospital Stay (HOSPITAL_COMMUNITY): Payer: Medicaid Other | Admitting: Anesthesiology

## 2019-07-08 ENCOUNTER — Encounter (HOSPITAL_COMMUNITY): Payer: Self-pay | Admitting: Internal Medicine

## 2019-07-08 HISTORY — PX: LAPAROTOMY: SHX154

## 2019-07-08 HISTORY — PX: BOWEL RESECTION: SHX1257

## 2019-07-08 LAB — COMPREHENSIVE METABOLIC PANEL
ALT: 17 U/L (ref 0–44)
AST: 19 U/L (ref 15–41)
Albumin: 1.6 g/dL — ABNORMAL LOW (ref 3.5–5.0)
Alkaline Phosphatase: 57 U/L (ref 38–126)
Anion gap: 6 (ref 5–15)
BUN: 10 mg/dL (ref 6–20)
CO2: 24 mmol/L (ref 22–32)
Calcium: 10.1 mg/dL (ref 8.9–10.3)
Chloride: 112 mmol/L — ABNORMAL HIGH (ref 98–111)
Creatinine, Ser: 0.76 mg/dL (ref 0.44–1.00)
GFR calc Af Amer: >60 mL/min (ref >60)
GFR calc non Af Amer: >60 mL/min (ref >60)
Glucose, Bld: 81 mg/dL (ref 70–99)
Potassium: 3.5 mmol/L (ref 3.5–5.1)
Sodium: 142 mmol/L (ref 135–145)
Total Bilirubin: 1.1 mg/dL (ref 0.3–1.2)
Total Protein: 4.7 g/dL — ABNORMAL LOW (ref 6.5–8.1)

## 2019-07-08 LAB — CBC WITH DIFFERENTIAL/PLATELET
Abs Immature Granulocytes: 0.19 10*3/uL — ABNORMAL HIGH (ref 0.00–0.07)
Basophils Absolute: 0 10*3/uL (ref 0.0–0.1)
Basophils Relative: 0 %
Eosinophils Absolute: 0.1 10*3/uL (ref 0.0–0.5)
Eosinophils Relative: 1 %
HCT: 31.2 % — ABNORMAL LOW (ref 36.0–46.0)
Hemoglobin: 10.5 g/dL — ABNORMAL LOW (ref 12.0–15.0)
Immature Granulocytes: 1 %
Lymphocytes Relative: 12 %
Lymphs Abs: 1.5 10*3/uL (ref 0.7–4.0)
MCH: 31 pg (ref 26.0–34.0)
MCHC: 33.7 g/dL (ref 30.0–36.0)
MCV: 92 fL (ref 80.0–100.0)
Monocytes Absolute: 1.3 10*3/uL — ABNORMAL HIGH (ref 0.1–1.0)
Monocytes Relative: 10 %
Neutro Abs: 10.3 10*3/uL — ABNORMAL HIGH (ref 1.7–7.7)
Neutrophils Relative %: 76 %
Platelets: 210 10*3/uL (ref 150–400)
RBC: 3.39 MIL/uL — ABNORMAL LOW (ref 3.87–5.11)
RDW: 13.3 % (ref 11.5–15.5)
WBC: 13.4 10*3/uL — ABNORMAL HIGH (ref 4.0–10.5)
nRBC: 0 % (ref 0.0–0.2)

## 2019-07-08 SURGERY — LAPAROTOMY, EXPLORATORY
Anesthesia: General

## 2019-07-08 MED ORDER — ACETAMINOPHEN 10 MG/ML IV SOLN
INTRAVENOUS | Status: DC | PRN
  Administered 2019-07-08: 1000 mg via INTRAVENOUS

## 2019-07-08 MED ORDER — PHENYLEPHRINE 40 MCG/ML (10ML) SYRINGE FOR IV PUSH (FOR BLOOD PRESSURE SUPPORT)
PREFILLED_SYRINGE | INTRAVENOUS | Status: AC
  Filled 2019-07-08: qty 10

## 2019-07-08 MED ORDER — MIDAZOLAM HCL 5 MG/5ML IJ SOLN
INTRAMUSCULAR | Status: DC | PRN
  Administered 2019-07-08: 2 mg via INTRAVENOUS

## 2019-07-08 MED ORDER — DEXAMETHASONE SODIUM PHOSPHATE 10 MG/ML IJ SOLN
INTRAMUSCULAR | Status: DC | PRN
  Administered 2019-07-08: 4 mg via INTRAVENOUS

## 2019-07-08 MED ORDER — FENTANYL CITRATE (PF) 250 MCG/5ML IJ SOLN
INTRAMUSCULAR | Status: DC | PRN
  Administered 2019-07-08 (×5): 100 ug via INTRAVENOUS

## 2019-07-08 MED ORDER — PROPOFOL 10 MG/ML IV BOLUS
INTRAVENOUS | Status: DC | PRN
  Administered 2019-07-08: 200 mg via INTRAVENOUS

## 2019-07-08 MED ORDER — LIDOCAINE 2% (20 MG/ML) 5 ML SYRINGE
INTRAMUSCULAR | Status: DC | PRN
  Administered 2019-07-08: 100 mg via INTRAVENOUS

## 2019-07-08 MED ORDER — SUGAMMADEX SODIUM 200 MG/2ML IV SOLN
INTRAVENOUS | Status: DC | PRN
  Administered 2019-07-08: 300 mg via INTRAVENOUS
  Administered 2019-07-08: 100 mg via INTRAVENOUS

## 2019-07-08 MED ORDER — ROCURONIUM BROMIDE 10 MG/ML (PF) SYRINGE
PREFILLED_SYRINGE | INTRAVENOUS | Status: AC
  Filled 2019-07-08: qty 10

## 2019-07-08 MED ORDER — MIDAZOLAM HCL 2 MG/2ML IJ SOLN
INTRAMUSCULAR | Status: AC
  Filled 2019-07-08: qty 2

## 2019-07-08 MED ORDER — PROPOFOL 10 MG/ML IV BOLUS
INTRAVENOUS | Status: AC
  Filled 2019-07-08: qty 20

## 2019-07-08 MED ORDER — ONDANSETRON HCL 4 MG/2ML IJ SOLN
INTRAMUSCULAR | Status: AC
  Filled 2019-07-08: qty 2

## 2019-07-08 MED ORDER — EPHEDRINE 5 MG/ML INJ
INTRAVENOUS | Status: AC
  Filled 2019-07-08: qty 10

## 2019-07-08 MED ORDER — ACETAMINOPHEN 10 MG/ML IV SOLN
INTRAVENOUS | Status: AC
  Filled 2019-07-08: qty 100

## 2019-07-08 MED ORDER — LIDOCAINE 2% (20 MG/ML) 5 ML SYRINGE
INTRAMUSCULAR | Status: AC
  Filled 2019-07-08: qty 5

## 2019-07-08 MED ORDER — DEXAMETHASONE SODIUM PHOSPHATE 10 MG/ML IJ SOLN
INTRAMUSCULAR | Status: AC
  Filled 2019-07-08: qty 1

## 2019-07-08 MED ORDER — 0.9 % SODIUM CHLORIDE (POUR BTL) OPTIME
TOPICAL | Status: DC | PRN
  Administered 2019-07-08 (×4): 1000 mL

## 2019-07-08 MED ORDER — FENTANYL CITRATE (PF) 250 MCG/5ML IJ SOLN
INTRAMUSCULAR | Status: AC
  Filled 2019-07-08: qty 5

## 2019-07-08 MED ORDER — ROCURONIUM BROMIDE 10 MG/ML (PF) SYRINGE
PREFILLED_SYRINGE | INTRAVENOUS | Status: DC | PRN
  Administered 2019-07-08: 50 mg via INTRAVENOUS
  Administered 2019-07-08 (×2): 20 mg via INTRAVENOUS
  Administered 2019-07-08: 10 mg via INTRAVENOUS
  Administered 2019-07-08: 20 mg via INTRAVENOUS

## 2019-07-08 MED ORDER — HYDROMORPHONE HCL 1 MG/ML IJ SOLN
INTRAMUSCULAR | Status: DC | PRN
  Administered 2019-07-08: .5 mg via INTRAVENOUS

## 2019-07-08 MED ORDER — ONDANSETRON HCL 4 MG/2ML IJ SOLN
INTRAMUSCULAR | Status: DC | PRN
  Administered 2019-07-08: 4 mg via INTRAVENOUS

## 2019-07-08 MED ORDER — WHITE PETROLATUM EX OINT
TOPICAL_OINTMENT | CUTANEOUS | Status: AC
  Administered 2019-07-08: 1
  Filled 2019-07-08: qty 28.35

## 2019-07-08 MED ORDER — ALBUMIN HUMAN 5 % IV SOLN: INTRAVENOUS | Status: DC | PRN

## 2019-07-08 MED ORDER — LACTATED RINGERS IV SOLN: INTRAVENOUS | Status: DC | PRN

## 2019-07-08 MED ORDER — PROMETHAZINE HCL 25 MG/ML IJ SOLN: 6.2500 mg | INTRAMUSCULAR | Status: DC | PRN

## 2019-07-08 MED ORDER — HYDROMORPHONE HCL 1 MG/ML IJ SOLN
INTRAMUSCULAR | Status: AC
  Filled 2019-07-08: qty 0.5

## 2019-07-08 MED ORDER — ESMOLOL HCL 100 MG/10ML IV SOLN
INTRAVENOUS | Status: DC | PRN
  Administered 2019-07-08 (×4): 10 mg via INTRAVENOUS

## 2019-07-08 MED ORDER — CHLORHEXIDINE GLUCONATE CLOTH 2 % EX PADS
6.0000 | MEDICATED_PAD | Freq: Every day | CUTANEOUS | Status: DC
  Administered 2019-07-08 – 2019-07-18 (×9): 6 via TOPICAL

## 2019-07-08 MED ORDER — FENTANYL CITRATE (PF) 100 MCG/2ML IJ SOLN: 25.0000 ug | INTRAMUSCULAR | Status: DC | PRN

## 2019-07-08 MED ORDER — METHOCARBAMOL 1000 MG/10ML IJ SOLN
500.0000 mg | Freq: Four times a day (QID) | INTRAVENOUS | Status: DC | PRN
  Filled 2019-07-08: qty 5

## 2019-07-08 MED ORDER — HYDROMORPHONE HCL 1 MG/ML IJ SOLN
0.5000 mg | INTRAMUSCULAR | Status: DC | PRN
  Administered 2019-07-08 – 2019-07-11 (×2): 0.5 mg via INTRAVENOUS
  Filled 2019-07-08 (×2): qty 1

## 2019-07-08 MED ORDER — SUCCINYLCHOLINE CHLORIDE 20 MG/ML IJ SOLN
INTRAMUSCULAR | Status: DC | PRN
  Administered 2019-07-08: 100 mg via INTRAVENOUS

## 2019-07-08 SURGICAL SUPPLY — 49 items
BLADE CLIPPER SURG (BLADE) IMPLANT
BNDG GAUZE ELAST 4 BULKY (GAUZE/BANDAGES/DRESSINGS) ×3 IMPLANT
CANISTER SUCT 3000ML PPV (MISCELLANEOUS) ×3 IMPLANT
CHLORAPREP W/TINT 26 (MISCELLANEOUS) ×3 IMPLANT
COVER SURGICAL LIGHT HANDLE (MISCELLANEOUS) ×3 IMPLANT
COVER WAND RF STERILE (DRAPES) IMPLANT
DRAPE LAPAROSCOPIC ABDOMINAL (DRAPES) ×3 IMPLANT
DRAPE WARM FLUID 44X44 (DRAPES) ×3 IMPLANT
DRSG OPSITE POSTOP 4X10 (GAUZE/BANDAGES/DRESSINGS) IMPLANT
DRSG OPSITE POSTOP 4X8 (GAUZE/BANDAGES/DRESSINGS) IMPLANT
DRSG PAD ABDOMINAL 8X10 ST (GAUZE/BANDAGES/DRESSINGS) ×6 IMPLANT
ELECT BLADE 6.5 EXT (BLADE) ×3 IMPLANT
ELECT CAUTERY BLADE 6.4 (BLADE) ×3 IMPLANT
ELECT REM PT RETURN 9FT ADLT (ELECTROSURGICAL) ×3
ELECTRODE REM PT RTRN 9FT ADLT (ELECTROSURGICAL) ×1 IMPLANT
GAUZE SPONGE 4X4 12PLY STRL (GAUZE/BANDAGES/DRESSINGS) ×3 IMPLANT
GLOVE BIO SURGEON STRL SZ 6 (GLOVE) ×3 IMPLANT
GLOVE BIO SURGEON STRL SZ 6.5 (GLOVE) ×2 IMPLANT
GLOVE BIO SURGEONS STRL SZ 6.5 (GLOVE) ×1
GLOVE BIOGEL PI IND STRL 7.0 (GLOVE) ×1 IMPLANT
GLOVE BIOGEL PI INDICATOR 7.0 (GLOVE) ×2
GLOVE INDICATOR 6.5 STRL GRN (GLOVE) ×3 IMPLANT
GOWN STRL REUS W/ TWL LRG LVL3 (GOWN DISPOSABLE) ×3 IMPLANT
GOWN STRL REUS W/TWL LRG LVL3 (GOWN DISPOSABLE) ×6
HANDLE SUCTION POOLE (INSTRUMENTS) ×1 IMPLANT
KIT BASIN OR (CUSTOM PROCEDURE TRAY) ×3 IMPLANT
KIT TURNOVER KIT B (KITS) ×3 IMPLANT
LIGASURE IMPACT 36 18CM CVD LR (INSTRUMENTS) ×3 IMPLANT
NS IRRIG 1000ML POUR BTL (IV SOLUTION) ×6 IMPLANT
PACK GENERAL/GYN (CUSTOM PROCEDURE TRAY) ×3 IMPLANT
PAD ARMBOARD 7.5X6 YLW CONV (MISCELLANEOUS) ×3 IMPLANT
PENCIL SMOKE EVACUATOR (MISCELLANEOUS) ×3 IMPLANT
RELOAD PROXIMATE 75MM BLUE (ENDOMECHANICALS) ×9 IMPLANT
RELOAD PROXIMATE TA60MM BLUE (ENDOMECHANICALS) ×3 IMPLANT
SPECIMEN JAR LARGE (MISCELLANEOUS) IMPLANT
SPONGE LAP 18X18 RF (DISPOSABLE) ×3 IMPLANT
STAPLER GUN LINEAR PROX 60 (STAPLE) ×3 IMPLANT
STAPLER PROXIMATE 75MM BLUE (STAPLE) ×3 IMPLANT
STAPLER VISISTAT 35W (STAPLE) ×3 IMPLANT
SUCTION POOLE HANDLE (INSTRUMENTS) ×3
SUT PDS AB 1 TP1 96 (SUTURE) ×6 IMPLANT
SUT SILK 2 0 SH CR/8 (SUTURE) ×3 IMPLANT
SUT SILK 2 0 TIES 10X30 (SUTURE) ×3 IMPLANT
SUT SILK 3 0 SH CR/8 (SUTURE) ×6 IMPLANT
SUT SILK 3 0 TIES 10X30 (SUTURE) ×3 IMPLANT
SUT VIC AB 3-0 SH 18 (SUTURE) IMPLANT
TOWEL GREEN STERILE (TOWEL DISPOSABLE) ×3 IMPLANT
TRAY FOLEY MTR SLVR 16FR STAT (SET/KITS/TRAYS/PACK) IMPLANT
WATER STERILE IRR 1000ML POUR (IV SOLUTION) ×3 IMPLANT

## 2019-07-08 NOTE — Anesthesia Postprocedure Evaluation (Signed)
Anesthesia Post Note  Patient: Theresa Mccoy  Procedure(s) Performed: EXPLORATORY LAPAROTOMY (N/A ) Small Bowel Resection (N/A )     Patient location during evaluation: PACU Anesthesia Type: General Level of consciousness: awake and alert Pain management: pain level controlled Vital Signs Assessment: post-procedure vital signs reviewed and stable Respiratory status: spontaneous breathing, nonlabored ventilation and respiratory function stable Cardiovascular status: blood pressure returned to baseline and stable Postop Assessment: no apparent nausea or vomiting Anesthetic complications: no    Last Vitals:  Vitals:   07/08/19 1330 07/08/19 1409  BP: (!) 140/92 122/85  Pulse:  87  Resp:    Temp:  (!) 36.3 C  SpO2:  95%    Last Pain:  Vitals:   07/08/19 1409  TempSrc: Oral  PainSc:                  Audry Pili

## 2019-07-08 NOTE — Progress Notes (Addendum)
   Subjective:   Patient is in a significant amount of pain this AM. She was unaware of the possibility of surgery today and wants to talk to the surgical team. We discussed her current hospitalization. She will ask the surgical team more questions.   Objective:  Vital signs in last 24 hours: Vitals:   07/07/19 1530 07/07/19 1803 07/07/19 2202 07/08/19 0527  BP:   (!) 160/94 135/86  Pulse:   77 77  Resp:   20 18  Temp: (!) 102.2 F (39 C) 99 F (37.2 C) 98.7 F (37.1 C) 98.2 F (36.8 C)  TempSrc: Oral Oral Oral Oral  SpO2:   96% 95%  Weight:      Height:        Physical Exam Vitals and nursing note reviewed.  Constitutional:      General: She is not in acute distress. Abdominal:     General: There is distension.     Palpations: Abdomen is rigid.     Tenderness: There is generalized abdominal tenderness.     Comments: More rigid than yesterday, with increased distention as well. Tenderness is unchanged  Skin:    General: Skin is warm and dry.  Neurological:     Mental Status: She is alert.    Assessment/Plan:  Principal Problem:   SBO (small bowel obstruction) (HCC) Active Problems:   Urinary tract infection   Hypercalcemia   Primary hyperparathyroidism ([place])  Ms. Nashiyah Hofmeyer a 39 year old female with past medical history of anemia, depression, hypothyroidism, PTSD who presented with 1 day history of abdominal pain and was found to have small bowel obstruction and a urinary tract infection.  # Small bowel obstruction: Likely adhesive in origin given location inthesetting of multipleC-sections and possible salpingectomy.Patient has been NPO with NGT placed since 12/26. Zosyn empirically started on 12/27. Given her lack of progression with conservative measures surgery has recommended exploratory laparotomy today.   - General surgery is on board and we appreciate their recommendations  - IVF maintenance: LR @ 250 cc/hr  - Zosyn, dosing per  pharmacy -Tylenol 650 mg every 6 hoursPRNfor mild pain/fever -Toradol 15 mg every 6 hoursPRNfor severe pain -Zofran 4 mg every 6 hoursPRN - Telemetry  # Primary Hyperparathyroidism: # Hypercalcemia:  Awaiting sestamibi imaging. Her calcemia has continued to improve. Given her age and degree of elevation she will likely need surgical intervention   - CMP daily   # Hypokalemia  Secondary to GI losses. Monitor daily. Replenish PRN.   # NormocyticAnemia:  Workup indicated anemia of chronic disease. Monitoring closely since it has steadily decreased during admission. Acute bleed is lower on the differential though as vital signs stable. Possibly dilutional given aggressive IVF.     -CBC daily   # Urinary tract infection: Urinalysis revealed moderate leukocytes, greater than 50 white blood cells, many bacteria;patient reported pain with urination initially. Urine cultureshows >= 100,000 colonies GNR,speciated as E. Coli that is sensitive to Zosyn.No symptoms reported today.  #AKI 2/2 hypovolemia: Resolved   Dispo: Anticipated dischargepending clinical improvement.  Dr. Jose Persia Internal Medicine PGY-1  Pager: [phone] 07/08/2019, 7:38 AM

## 2019-07-08 NOTE — Anesthesia Procedure Notes (Addendum)
Procedure Name: Intubation Date/Time: 07/08/2019 10:18 AM Performed by: Amadeo Garnet, CRNA Pre-anesthesia Checklist: Patient identified, Emergency Drugs available, Suction available and Patient being monitored Patient Re-evaluated:Patient Re-evaluated prior to induction Oxygen Delivery Method: Circle system utilized Preoxygenation: Pre-oxygenation with 100% oxygen Induction Type: Rapid sequence and IV induction Laryngoscope Size: Mac and 3 Grade View: Grade I Tube type: Oral Tube size: 7.0 mm Number of attempts: 1 Airway Equipment and Method: Stylet Placement Confirmation: ETT inserted through vocal cords under direct vision,  positive ETCO2 and breath sounds checked- equal and bilateral Secured at: 22 cm Tube secured with: Tape Dental Injury: Teeth and Oropharynx as per pre-operative assessment

## 2019-07-08 NOTE — Transfer of Care (Signed)
Immediate Anesthesia Transfer of Care Note  Patient: Havisha Salcedo  Procedure(s) Performed: EXPLORATORY LAPAROTOMY (N/A ) Small Bowel Resection (N/A )  Patient Location: PACU  Anesthesia Type:General  Level of Consciousness: awake, alert  and oriented  Airway & Oxygen Therapy: Patient Spontanous Breathing and Patient connected to face mask oxygen  Post-op Assessment: Report given to RN, Post -op Vital signs reviewed and stable and Patient moving all extremities  Post vital signs: Reviewed and stable  Last Vitals:  Vitals Value Taken Time  BP    Temp    Pulse 90 07/08/19 1227  Resp 13 07/08/19 1227  SpO2 100 % 07/08/19 1227  Vitals shown include unvalidated device data.  Last Pain:  Vitals:   07/08/19 0730  TempSrc:   PainSc: 0-No pain      Patients Stated Pain Goal: 0 ([id number])  Complications: No apparent anesthesia complications

## 2019-07-08 NOTE — Progress Notes (Signed)
Pharmacy Antibiotic Note  Theresa Mccoy is a 39 y.o. female admitted on 07/01/2019 with r/o intra-abdominal infection.  Pharmacy has been consulted for Zosyn dosing.  ID: Day 7 abx for UTI and empiric for SBO. - Tmax 102.2, WBC up to 13.4. Worsening abd pain. Continuing zosyn for SBO. UTI, cx e.coli but no sxs reported.  12/27 Zosyn>> 12/26 CTX >12/27  12/25 UCX: 100k E. Coli: pan sensitive  Plan: Zosyn 3.375 q8h for SBO, UTI. No dosage adjustment required now. 07/08/19: Ex lap with [facility] Pharmacy will sign off. Please reconsult for further dosing assitance.   Height: 5' (152.4 cm) Weight: 164 lb (74.4 kg) IBW/kg (Calculated) : 45.5  Temp (24hrs), Avg:99 F (37.2 C), Min:97 F (36.1 C), Max:102.2 F (39 C)  Recent Labs  Lab 07/03/19 1213 07/03/19 1556 07/04/19 0100 07/05/19 0145 07/06/19 0325 07/07/19 0355 07/07/19 1508 07/07/19 1630 07/07/19 1848 07/08/19 0255  WBC  --   --  6.6 12.4* 14.0* 10.2  --  11.3*  --  13.4*  CREATININE  --   --  0.94 0.88 0.84 0.75 0.73  --   --  0.76  LATICACIDVEN 1.3 3.0* 1.4  --   --   --   --  1.0 1.1  --     Estimated Creatinine Clearance: 85.9 mL/min (by C-G formula based on SCr of 0.76 mg/dL).    Allergies  Allergen Reactions  . Codeine Other (See Comments)    constipation     Evangelene Vora S. Alford Highland, PharmD, BCPS Clinical Staff Pharmacist [email or web address] [place] 07/08/2019 2:02 PM

## 2019-07-08 NOTE — Progress Notes (Signed)
Progress Note: General Surgery Service   Chief Complaint/Subjective: No further BMs or flatus, pain about the same.  Had a fever yesterday afternoon.  NG advanced with good output last night  Objective: Vital signs in last 24 hours: Temp:  [98.2 F (36.8 C)-102.2 F (39 C)] 98.2 F (36.8 C) (01/01 0527) Pulse Rate:  [77-118] 77 (01/01 0527) Resp:  [18-20] 18 (01/01 0527) BP: (135-160)/(86-95) 135/86 (01/01 0527) SpO2:  [90 %-96 %] 95 % (01/01 0527) Last BM Date: 07/05/19  Intake/Output from previous day: 12/31 0701 - 01/01 0700 In: 1526.8 [NG/GT:700; IV Piggyback:826.8] Out: 1000 [Urine:100; Emesis/NG output:900] Intake/Output this shift: No intake/output data recorded.  Gen: NAD  Resp: nonlabored  Card: RRR  Abd: soft, moderate distension, mildly tender mid abd  Lab Results: CBC  Recent Labs    07/07/19 1630 07/08/19 0255  WBC 11.3* 13.4*  HGB 10.8* 10.5*  HCT 31.5* 31.2*  PLT 210 210   BMET Recent Labs    07/07/19 1508 07/08/19 0255  NA 144 142  K 3.0* 3.5  CL 114* 112*  CO2 23 24  GLUCOSE 86 81  BUN 12 10  CREATININE 0.73 0.76  CALCIUM 11.0* 10.1   PT/INR No results for input(s): LABPROT, INR in the last 72 hours. ABG No results for input(s): PHART, HCO3 in the last 72 hours.  Invalid input(s): PCO2, PO2  Anti-infectives: Anti-infectives (From admission, onward)   Start     Dose/Rate Route Frequency Ordered Stop   07/03/19 1400  piperacillin-tazobactam (ZOSYN) IVPB 3.375 g     3.375 g 12.5 mL/hr over 240 Minutes Intravenous Every 8 hours 07/03/19 1005     07/03/19 1015  piperacillin-tazobactam (ZOSYN) IVPB 3.375 g  Status:  Discontinued     3.375 g 12.5 mL/hr over 240 Minutes Intravenous Every 8 hours 07/03/19 1005 07/03/19 1005   07/03/19 0900  cefTRIAXone (ROCEPHIN) 1 g in sodium chloride 0.9 % 100 mL IVPB     1 g 200 mL/hr over 30 Minutes Intravenous  Once 07/02/19 1351 07/03/19 0936   07/02/19 0830  cefTRIAXone (ROCEPHIN) 1 g in  sodium chloride 0.9 % 100 mL IVPB     1 g 200 mL/hr over 30 Minutes Intravenous  Once 07/02/19 0752 07/02/19 1117      Medications: Scheduled Meds: . enoxaparin (LOVENOX) injection  40 mg Subcutaneous Q24H  . sodium chloride flush  3 mL Intravenous Q12H   Continuous Infusions: . lactated ringers 250 mL/hr at 07/07/19 1759  . piperacillin-tazobactam (ZOSYN)  IV 3.375 g (07/08/19 0543)   PRN Meds:.acetaminophen (TYLENOL) oral liquid 160 mg/5 mL, ketorolac, ondansetron (ZOFRAN) IV  Assessment/Plan: 39 yo female with small bowel obstruction This does not appear to be resolving with conservative management.  I have recommended surgery.  Discussed with Dr Kae Heller and the patient and both are agreeable to this.   Hypokalemia: resolved  Hypercalcemia -PTH elevated -will need follow up with [facility] or Endocrinology   LOS: 6 days   Rosario Adie, [place] [id number] [phone] [facility]

## 2019-07-08 NOTE — Progress Notes (Signed)
Patient interviewed and examined. I agree with Dr. Marcello Moores. SBo failure of medical management, now with pain and fever. OR this morning. Discussed plan for laparotomy, possible bowel resection, risks of bleeding, infection, pain, scarring, hernia, ileus, anastomotic leak or abscess, cardiovascular/ thromboembolic/ pulmonary complications, wound healing problems, etc. Questions welcomed and answered. Patient wishes to proceed.

## 2019-07-08 NOTE — Plan of Care (Signed)
  Problem: Pain Managment: Goal: General experience of comfort will improve Outcome: Progressing   Problem: Safety: Goal: Ability to remain free from injury will improve Outcome: Progressing   Problem: Skin Integrity: Goal: Risk for impaired skin integrity will decrease Outcome: Progressing   

## 2019-07-08 NOTE — Anesthesia Preprocedure Evaluation (Addendum)
Anesthesia Evaluation  Patient identified by MRN, date of birth, ID band Patient awake    Reviewed: Allergy & Precautions, NPO status , Patient's Chart, lab work & pertinent test results  History of Anesthesia Complications Negative for: history of anesthetic complications  Airway Mallampati: II  TM Distance: >3 FB Neck ROM: Full    Dental  (+) Dental Advisory Given, Teeth Intact   Pulmonary Current Smoker and Patient abstained from smoking.,    Pulmonary exam normal        Cardiovascular hypertension, Normal cardiovascular exam     Neuro/Psych PSYCHIATRIC DISORDERS Anxiety Depression Bipolar Disorder  "Nerve issues in my whole body" described mainly as sensory deficits (hands, feet)     GI/Hepatic Neg liver ROS,  NGT in place SBO    Endo/Other  Hyperthyroidism  Obesity Hyperparathyroidism   Renal/GU negative Renal ROS     Musculoskeletal negative musculoskeletal ROS (+)   Abdominal   Peds  Hematology negative hematology ROS (+)   Anesthesia Other Findings   Reproductive/Obstetrics                            Anesthesia Physical Anesthesia Plan  ASA: II and emergent  Anesthesia Plan: General   Post-op Pain Management:    Induction: Intravenous and Rapid sequence  PONV Risk Score and Plan: 4 or greater and Treatment may vary due to age or medical condition, Ondansetron, Dexamethasone and Midazolam  Airway Management Planned: Oral ETT  Additional Equipment: None  Intra-op Plan:   Post-operative Plan: Extubation in OR  Informed Consent: I have reviewed the patients History and Physical, chart, labs and discussed the procedure including the risks, benefits and alternatives for the proposed anesthesia with the patient or authorized representative who has indicated his/her understanding and acceptance.     Dental advisory given  Plan Discussed with: CRNA and  Anesthesiologist  Anesthesia Plan Comments:        Anesthesia Quick Evaluation

## 2019-07-08 NOTE — Op Note (Signed)
Operative Note  Theresa Mccoy  [id number]  [id number]  07/08/2019   Surgeon: Victorino Sparrow ConnorMD  Assistant: Leighton Ruff MD  Procedure performed: Exploratory laparotomy, small bowel resection  Procedure classification urgent/emergent  Infection present at time of surgery: Yes, diffuse feculent peritonitis and necrotic small bowel  Preop diagnosis: Small bowel obstruction Post-op diagnosis/intraop findings: Closed-loop obstruction with necrotic perforated small bowel and frank spillage of copious succus  Specimens: Small bowel resection Retained items: No EBL: [id number] Complications: none  Description of procedure: After obtaining informed consent the patient was taken to the operating room and placed supine on operating room table wheregeneral endotracheal anesthesia was initiated, preoperative antibiotics were administered, SCDs applied, and a formal timeout was performed.  The abdomen was prepped and draped in usual sterile fashion.  A vertical midline incision was created and the abdomen explored.  There were dense adhesions of omentum and small bowel in the pelvis as well as frank succus.  The majority of adhesions were able to be bluntly gently divided however there was a thin band in the pelvis which was divided with Metzenbaum scissors after which we were able to deliver the small bowel into the wound.  There was a segment of mid ileum approximately 50 cm in length which was frankly necrotic with perforation and spillage.  This was resected using serial fires of the blue load linear cutting stapler and then an anastomosis was created using a 75 mm blue load GIA and the common enterotomy closed with a TX 60blue load.  A 3-0 silk was placed at the apex of the staple line and the mesenteric defect was closed with interrupted 3-0 silks.  The rest of the bowel was inspected from the ligament of Treitz to the ileocecal valve and there was no other obstruction present.  There was one small  serosal injury from our adhesiolysis which was imbricated with a 3-0 silk seromuscular suture.  The ascending, transverse, descending and sigmoid appeared intact, there is some secondary inflammation of the sigmoid but no injury or perforation and it appears viable.  The NG tube is palpated in the stomach.  The anastomosis was reinspected and I was concerned about some ischemic changes along the staple line on the side of the bowel which had been very dilated therefore I resected this anastomosis and created a new one just proximal in a similar fashion.  On completion this appeared well perfused, patent, and under no tension.  The abdomen was irrigated with several liters of warm sterile saline and inflammatory exudate debrided as much as possible from the bowel wall and in the pelvis.  The bowel was returned to the abdominal cavity in the correct anatomical position and the omentum was brought back down to cover it.  The midline incision was closed with running looped #1 PDS starting at either end and tying centrally.  The wound was packed with a saline moistened wet-to-dry dressing.  The patient was then awakened, extubated and taken to PACU in stable condition.   All counts were correct at the completion of the case.

## 2019-07-09 ENCOUNTER — Inpatient Hospital Stay (HOSPITAL_COMMUNITY): Payer: Medicaid Other

## 2019-07-09 LAB — COMPREHENSIVE METABOLIC PANEL
ALT: 15 U/L (ref 0–44)
AST: 21 U/L (ref 15–41)
Albumin: 1.6 g/dL — ABNORMAL LOW (ref 3.5–5.0)
Alkaline Phosphatase: 52 U/L (ref 38–126)
Anion gap: 8 (ref 5–15)
BUN: 12 mg/dL (ref 6–20)
CO2: 23 mmol/L (ref 22–32)
Calcium: 8.8 mg/dL — ABNORMAL LOW (ref 8.9–10.3)
Chloride: 113 mmol/L — ABNORMAL HIGH (ref 98–111)
Creatinine, Ser: 0.78 mg/dL (ref 0.44–1.00)
GFR calc Af Amer: >60 mL/min (ref >60)
GFR calc non Af Amer: >60 mL/min (ref >60)
Glucose, Bld: 101 mg/dL — ABNORMAL HIGH (ref 70–99)
Potassium: 4 mmol/L (ref 3.5–5.1)
Sodium: 144 mmol/L (ref 135–145)
Total Bilirubin: 0.9 mg/dL (ref 0.3–1.2)
Total Protein: 3.8 g/dL — ABNORMAL LOW (ref 6.5–8.1)

## 2019-07-09 LAB — CBC WITH DIFFERENTIAL/PLATELET
Abs Immature Granulocytes: 0.31 10*3/uL — ABNORMAL HIGH (ref 0.00–0.07)
Basophils Absolute: 0.1 10*3/uL (ref 0.0–0.1)
Basophils Relative: 0 %
Eosinophils Absolute: 0 10*3/uL (ref 0.0–0.5)
Eosinophils Relative: 0 %
HCT: 26.7 % — ABNORMAL LOW (ref 36.0–46.0)
Hemoglobin: 9.1 g/dL — ABNORMAL LOW (ref 12.0–15.0)
Immature Granulocytes: 1 %
Lymphocytes Relative: 6 %
Lymphs Abs: 1.5 10*3/uL (ref 0.7–4.0)
MCH: 31 pg (ref 26.0–34.0)
MCHC: 34.1 g/dL (ref 30.0–36.0)
MCV: 90.8 fL (ref 80.0–100.0)
Monocytes Absolute: 1.1 10*3/uL — ABNORMAL HIGH (ref 0.1–1.0)
Monocytes Relative: 5 %
Neutro Abs: 20 10*3/uL — ABNORMAL HIGH (ref 1.7–7.7)
Neutrophils Relative %: 88 %
Platelets: 169 10*3/uL (ref 150–400)
RBC: 2.94 MIL/uL — ABNORMAL LOW (ref 3.87–5.11)
RDW: 13.2 % (ref 11.5–15.5)
WBC: 22.9 10*3/uL — ABNORMAL HIGH (ref 4.0–10.5)
nRBC: 0 % (ref 0.0–0.2)

## 2019-07-09 MED ORDER — TECHNETIUM TC 99M SESTAMIBI - CARDIOLITE
25.4000 | Freq: Once | INTRAVENOUS | Status: AC | PRN
  Administered 2019-07-09: 08:00:00 25.4 via INTRAVENOUS

## 2019-07-09 MED ORDER — PHENOL 1.4 % MT LIQD
1.0000 | OROMUCOSAL | Status: DC | PRN
  Administered 2019-07-09: 15:00:00 1 via OROMUCOSAL
  Filled 2019-07-09: qty 177

## 2019-07-09 MED ORDER — SALINE SPRAY 0.65 % NA SOLN
1.0000 | NASAL | Status: DC | PRN
  Administered 2019-07-09: 12:00:00 1 via NASAL
  Filled 2019-07-09: qty 44

## 2019-07-09 NOTE — Progress Notes (Signed)
1 Day Post-Op   Subjective/Chief Complaint: C/O throat pain from NGT, feels less distended but no flatus   Objective: Vital signs in last 24 hours: Temp:  [97.4 F (36.3 C)-98.5 F (36.9 C)] 98.4 F (36.9 C) (01/02 0112) Pulse Rate:  [58-95] 63 (01/02 0112) Resp:  [14-18] 17 (01/02 0112) BP: (106-148)/(73-97) 128/81 (01/02 0112) SpO2:  [92 %-100 %] 96 % (01/02 0112) Last BM Date: 07/05/19  Intake/Output from previous day: 01/01 0701 - 01/02 0700 In: 5521 [I.V.:4941; NG/GT:30; IV Piggyback:550] Out: 1975 [Urine:1025; Emesis/NG output:900; Blood:50] Intake/Output this shift: No intake/output data recorded.  General appearance: cooperative GI: soft, dressing with dry stain, quiet  Lab Results:  Recent Labs    07/08/19 0255 07/09/19 0303  WBC 13.4* 22.9*  HGB 10.5* 9.1*  HCT 31.2* 26.7*  PLT 210 169   BMET Recent Labs    07/08/19 0255 07/09/19 0303  NA 142 144  K 3.5 4.0  CL 112* 113*  CO2 24 23  GLUCOSE 81 101*  BUN 10 12  CREATININE 0.76 0.78  CALCIUM 10.1 8.8*   PT/INR No results for input(s): LABPROT, INR in the last 72 hours. ABG No results for input(s): PHART, HCO3 in the last 72 hours.  Invalid input(s): PCO2, PO2  Studies/Results: DG Abd 1 View  Result Date: 07/07/2019 CLINICAL DATA:  Abdominal pain. Concern for small bowel obstruction. EXAM: ABDOMEN - 1 VIEW COMPARISON:  Abdominal radiograph 07/07/2019 at 6:38 a.m. FINDINGS: There are again several dilated loops of small bowel in the left hemiabdomen measuring up to 3.8 cm, similar to the earlier same day study. There is no definite free air. Small right pleural effusion and bibasilar opacities likely representing atelectasis. No acute finding in the visualized skeleton. IMPRESSION: 1. Persistent small bowel dilatation measuring up to 3.8 cm, similar to the earlier same day study, consistent with obstruction. No definite free air. 2. Small right pleural effusion and probable bibasilar atelectasis.  Electronically Signed   By: Audie Pinto M.D.   On: 07/07/2019 17:32   DG Abd Decub  Result Date: 07/07/2019 CLINICAL DATA:  Abdominal pain. History of small-bowel obstruction. Rule out free air. EXAM: ABDOMEN - 1 VIEW DECUBITUS COMPARISON:  Abdominal radiograph 07/07/2019 at 4:50 p.m. FINDINGS: There is no evidence of free air on decubitus views. Dilated loops of small bowel with air-fluid levels are present. IMPRESSION: No evidence of free intraperitoneal air. Electronically Signed   By: Audie Pinto M.D.   On: 07/07/2019 17:33    Anti-infectives: Anti-infectives (From admission, onward)   Start     Dose/Rate Route Frequency Ordered Stop   07/03/19 1400  piperacillin-tazobactam (ZOSYN) IVPB 3.375 g     3.375 g 12.5 mL/hr over 240 Minutes Intravenous Every 8 hours 07/03/19 1005     07/03/19 1015  piperacillin-tazobactam (ZOSYN) IVPB 3.375 g  Status:  Discontinued     3.375 g 12.5 mL/hr over 240 Minutes Intravenous Every 8 hours 07/03/19 1005 07/03/19 1005   07/03/19 0900  cefTRIAXone (ROCEPHIN) 1 g in sodium chloride 0.9 % 100 mL IVPB     1 g 200 mL/hr over 30 Minutes Intravenous  Once 07/02/19 1351 07/03/19 0936   07/02/19 0830  cefTRIAXone (ROCEPHIN) 1 g in sodium chloride 0.9 % 100 mL IVPB     1 g 200 mL/hr over 30 Minutes Intravenous  Once 07/02/19 0752 07/02/19 1117      Assessment/Plan: s/p Procedure(s): EXPLORATORY LAPAROTOMY (N/A) Small Bowel Resection (N/A) POD#1 Await bowel function Zosyn for peritonitis  Phenol spray PRN for throat Labs in AM  LOS: 7 days    Theresa Mccoy 07/09/2019

## 2019-07-09 NOTE — Progress Notes (Signed)
Subjective:  Pt seen at the bedside this morning. States she feels "okay," and her stomach pain is better. Endorses an uncomfortable feeling in her throat secondary to the NG tube. Denies any other complaints.    Objective:  Vital signs in last 24 hours: Vitals:   07/08/19 1409 07/08/19 2057 07/08/19 2232 07/09/19 0112  BP: 122/85 106/73 (!) 139/92 128/81  Pulse: 87 (!) 58 63 63  Resp:  16 18 17   Temp: (!) 97.4 F (36.3 C) 97.8 F (36.6 C) 98.5 F (36.9 C) 98.4 F (36.9 C)  TempSrc: Oral Oral Oral Oral  SpO2: 95% 100% 96% 96%  Weight:      Height:        Physical Exam Vitals and nursing note reviewed.  Constitutional:      General: She is not in acute distress.    Appearance: She is not ill-appearing.  HENT:     Head: Normocephalic and atraumatic.  Cardiovascular:     Rate and Rhythm: Normal rate and regular rhythm.  Pulmonary:     Effort: Pulmonary effort is normal.     Breath sounds: Normal breath sounds.  Abdominal:     General: Bowel sounds are absent. There is no distension.     Palpations: Abdomen is soft.     Tenderness: There is no abdominal tenderness.     Comments: No TTP, no BS noted, bandages over midline  Skin:    General: Skin is warm and dry.  Neurological:     Mental Status: She is alert.    Assessment/Plan:  Principal Problem:   SBO (small bowel obstruction) (HCC) Active Problems:   Urinary tract infection   Hypercalcemia   Primary hyperparathyroidism ([place])  Ms. Jasime Sill a 39 year old female with past medical history of anemia, depression, hypothyroidism, PTSD who presented with 1 day history of abdominal pain and was found to have small bowel obstruction and a urinary tract infection.  # Small bowel obstruction: Patient presented with abdominal pain and found to have a SBO on imaging. Conservative management was attempted however patient had no improvement. Zosyn empirically started 12/27. Taken for Ex-lap on 07/07/2018 that  showed closed loop obstruction with necrotic perforated small bowel and frank spillage of copious succus, SB resection and anastomosis was completed. Remains afebrile, WBC increased to 22.9, likely related to surgery. Abd pain improved today, no active BS noted.   - General surgery is on board and we appreciate their recommendations  - IVF maintenance: LR @ 250 cc/hr  - Zosyn, dosing per pharmacy -Tylenol 650 mg every 6 hoursPRNfor mild pain/fever -Toradol 15 mg every 6 hoursPRNfor severe pain -Zofran 4 mg every 6 hoursPRN - Telemetry  # Primary Hyperparathyroidism: # Hypercalcemia:  Awaiting sestamibi imaging. Her calcemia has continued to improve, today calcium is actually a little low at 8.8. Given her age and degree of elevation she will likely need surgical intervention   - Going for Parathyroid scan today - CMP daily   # Hypokalemia  Secondary to GI losses. Monitor daily. Replenish PRN.   # NormocyticAnemia:  Workup indicated anemia of chronic disease. Monitoring closely since it has steadily decreased during admission. Acute bleed is lower on the differential though as vital signs stable. Possibly dilutional given aggressive IVF. Hgb has remained stable.   -CBC daily   # Urinary tract infection: Urinalysis revealed moderate leukocytes, greater than 50 white blood cells, many bacteria;patient reported pain with urination initially. Urine cultureshows >= 100,000 colonies GNR,speciated as E. Coli that  is sensitive to Zosyn.No symptoms reported today.Continue to monitor for symptoms.   #AKI 2/2 hypovolemia: Resolved  Dispo: Anticipated dischargepending clinical improvement.  Asencion Noble, M.D. PGY2 Pager [phone] 07/09/2019 6:28 AM

## 2019-07-10 LAB — CBC WITH DIFFERENTIAL/PLATELET
Abs Immature Granulocytes: 0.57 10*3/uL — ABNORMAL HIGH (ref 0.00–0.07)
Basophils Absolute: 0 10*3/uL (ref 0.0–0.1)
Basophils Relative: 0 %
Eosinophils Absolute: 0.2 10*3/uL (ref 0.0–0.5)
Eosinophils Relative: 1 %
HCT: 25.6 % — ABNORMAL LOW (ref 36.0–46.0)
Hemoglobin: 8.6 g/dL — ABNORMAL LOW (ref 12.0–15.0)
Immature Granulocytes: 3 %
Lymphocytes Relative: 10 %
Lymphs Abs: 1.7 10*3/uL (ref 0.7–4.0)
MCH: 31.4 pg (ref 26.0–34.0)
MCHC: 33.6 g/dL (ref 30.0–36.0)
MCV: 93.4 fL (ref 80.0–100.0)
Monocytes Absolute: 0.7 10*3/uL (ref 0.1–1.0)
Monocytes Relative: 4 %
Neutro Abs: 13.6 10*3/uL — ABNORMAL HIGH (ref 1.7–7.7)
Neutrophils Relative %: 82 %
Platelets: 191 10*3/uL (ref 150–400)
RBC: 2.74 MIL/uL — ABNORMAL LOW (ref 3.87–5.11)
RDW: 13.5 % (ref 11.5–15.5)
WBC: 16.8 10*3/uL — ABNORMAL HIGH (ref 4.0–10.5)
nRBC: 0 % (ref 0.0–0.2)

## 2019-07-10 LAB — COMPREHENSIVE METABOLIC PANEL
ALT: 17 U/L (ref 0–44)
AST: 19 U/L (ref 15–41)
Albumin: 1.6 g/dL — ABNORMAL LOW (ref 3.5–5.0)
Alkaline Phosphatase: 56 U/L (ref 38–126)
Anion gap: 9 (ref 5–15)
BUN: 12 mg/dL (ref 6–20)
CO2: 26 mmol/L (ref 22–32)
Calcium: 8.7 mg/dL — ABNORMAL LOW (ref 8.9–10.3)
Chloride: 109 mmol/L (ref 98–111)
Creatinine, Ser: 0.78 mg/dL (ref 0.44–1.00)
GFR calc Af Amer: >60 mL/min (ref >60)
GFR calc non Af Amer: >60 mL/min (ref >60)
Glucose, Bld: 88 mg/dL (ref 70–99)
Potassium: 3 mmol/L — ABNORMAL LOW (ref 3.5–5.1)
Sodium: 144 mmol/L (ref 135–145)
Total Bilirubin: 0.9 mg/dL (ref 0.3–1.2)
Total Protein: 4.2 g/dL — ABNORMAL LOW (ref 6.5–8.1)

## 2019-07-10 MED ORDER — POTASSIUM CHLORIDE 10 MEQ/100ML IV SOLN
10.0000 meq | INTRAVENOUS | Status: AC
  Administered 2019-07-10 (×3): 10 meq via INTRAVENOUS
  Filled 2019-07-10 (×4): qty 100

## 2019-07-10 NOTE — Progress Notes (Addendum)
No output from NGT tonight. Flushed and checked placement, NGT intact. Tried to advance the tube further down still no output. Second nurse verified. Pt's abdomen feels less distended but no bowel sounds. Will monitor pt.

## 2019-07-10 NOTE — Progress Notes (Signed)
Subjective:   Theresa Mccoy reports she is doing okay this morning. Her stomach is hurting less than previously and she was able to tolerate a clear diet without problems this morning. Her NGT has been removed and diet advanced after having a bowel movement today. She is having some right hand pain where the IV is located though. No other complaints at this time.    Objective:  Vital signs in last 24 hours: Vitals:   07/09/19 0112 07/09/19 1359 07/09/19 2133 07/10/19 0624  BP: 128/81 132/74 116/67 135/73  Pulse: 63 (!) 54 67 71  Resp: 17 18 17 17   Temp: 98.4 F (36.9 C) 98.3 F (36.8 C) 99 F (37.2 C) 99.3 F (37.4 C)  TempSrc: Oral Oral Oral Oral  SpO2: 96% 95% 97% 96%  Weight:      Height:        Physical Exam Vitals and nursing note reviewed.  Constitutional:      General: She is not in acute distress.    Appearance: She is not ill-appearing.  HENT:     Head: Normocephalic and atraumatic.  Cardiovascular:     Rate and Rhythm: Normal rate and regular rhythm.  Pulmonary:     Effort: Pulmonary effort is normal.     Breath sounds: Normal breath sounds.  Abdominal:     General: There is distension (Improving ).     Palpations: Abdomen is soft.     Tenderness: There is no abdominal tenderness.     Comments: Minimal TTP, Very decreased but present BS noted, bandages over midline  Skin:    General: Skin is warm and dry.  Neurological:     General: No focal deficit present.     Mental Status: She is alert and oriented to person, place, and time.  Psychiatric:        Mood and Affect: Mood normal.        Behavior: Behavior normal.    Assessment/Plan:  Principal Problem:   SBO (small bowel obstruction) (HCC) Active Problems:   Urinary tract infection   Hypercalcemia   Primary hyperparathyroidism ([place])  [name] a 39 year old female with past medical history of anemia, depression, hypothyroidism, PTSD who presented with 1 day history of abdominal pain and  was found to have small bowel obstruction and a urinary tract infection.  # Small bowel obstruction: Patient presented with abdominal pain and found to have a SBO on imaging. Conservative management attempted for 6 days but failed. Zosyn empirically started 12/27. Taken for Ex-lap on 07/07/2018 that showed closed loop obstruction with 50 cm of necrotic, perforated small bowel and frank spillage of copious succus, SB resection and anastomosis was completed. Vital signs have remained stable and leukocytosis improving today. After having a bowel movement overnight, NGT has been able to be removed and diet advanced to clear liquids. Plan to continue Zosyn for at least a few more days given source control was only 2 days ago.   - General surgery is on board and we appreciate their recommendations  - IVF maintenance: LR @ 125 cc/hr. Wean when PO intake improves - Zosyn 3.375 mg q8h  -Tylenol 650 mg every 6 hoursPRNfor mild pain/fever -Toradol 15 mg every 6 hoursPRNfor severe pain - Dilaudid for breakthrough pain q2h -Zofran 4 mg every 6 hoursPRN - Clear Liquids  # Primary Hyperparathyroidism: # Hypercalcemia:  Given her age and degree of elevation she will likely need surgical intervention. Calcium has responded well to bisphosphonate dose given on  07/05/2019. Today, corrected Ca is 10.6. 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 Will touch base with surgery tomorrow regarding next steps.  - CMP daily   # Hypokalemia  Secondary to GI losses. Monitor daily. Replenish PRN.   - CMP daily  # NormocyticAnemia:  Workup indicated anemia of chronic disease. Monitoring closely since it has steadily decreased during admission. Hemoglobin has remained stable around 9.   -CBC daily   # Urinary tract infection: Urinalysis revealed moderate leukocytes, greater than 50 white blood cells, many bacteria;patient reported pain with urination initially.  Urine cultureshows >= 100,000 colonies GNR,speciated as E. Coli that is sensitive to Zosyn.No symptoms reported any longer.Continue to monitor for symptoms.   #AKI 2/2 hypovolemia: Resolved  Dispo: Anticipated dischargepending clinical improvement.  Dr. Jose Persia Internal Medicine PGY-1  Pager: [phone] 07/10/2019, 7:31 AM

## 2019-07-10 NOTE — Progress Notes (Signed)
2 Days Post-Op SBR  Subjective/Chief Complaint: Passing flatus, states she had a large BM   Objective: Vital signs in last 24 hours: Temp:  [98.3 F (36.8 C)-99.3 F (37.4 C)] 99.3 F (37.4 C) (01/03 0624) Pulse Rate:  [54-71] 71 (01/03 0624) Resp:  [17-18] 17 (01/03 0624) BP: (116-135)/(67-74) 135/73 (01/03 0624) SpO2:  [95 %-97 %] 96 % (01/03 0624) Last BM Date: (pre op)  Intake/Output from previous day: 01/02 0701 - 01/03 0700 In: 3914.3 [I.V.:3606.2; NG/GT:30; IV Piggyback:278.1] Out: 1050 [Urine:800; Emesis/NG output:250] Intake/Output this shift: No intake/output data recorded.  General appearance: cooperative GI: soft, dressing clean Lab Results:  Recent Labs    07/09/19 0303 07/10/19 0141  WBC 22.9* 16.8*  HGB 9.1* 8.6*  HCT 26.7* 25.6*  PLT 169 191   BMET Recent Labs    07/09/19 0303 07/10/19 0141  NA 144 144  K 4.0 3.0*  CL 113* 109  CO2 23 26  GLUCOSE 101* 88  BUN 12 12  CREATININE 0.78 0.78  CALCIUM 8.8* 8.7*   PT/INR No results for input(s): LABPROT, INR in the last 72 hours. ABG No results for input(s): PHART, HCO3 in the last 72 hours.  Invalid input(s): PCO2, PO2  Studies/Results: NM Parathyroid W/Spect  Result Date: 07/09/2019 CLINICAL DATA:  Evaluate for parathyroid adenoma. Hyperparathyroidism. EXAM: NM PARATHYROID SCINTIGRAPHY AND SPECT IMAGING TECHNIQUE: Following intravenous administration of radiopharmaceutical, early and 2-hour delayed planar images were obtained in the anterior projection. Delayed triplanar SPECT images were also obtained at 2 hours. RADIOPHARMACEUTICALS:  25.4 mCi Tc-52m Sestamibi IV COMPARISON:  None. FINDINGS: On the early images there is mild increased radiotracer uptake identified within both lobes of thyroid gland. On the 2 hour delayed images there is a small, persistent focus of mild increased uptake localizing to the area around the inferior pole of right lobe of thyroid gland. IMPRESSION: Small focus of  mildly persistent uptake localizing to the inferior pole of right lobe of thyroid gland. 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 Electronically Signed   By: Kerby Moors M.D.   On: 07/09/2019 12:36    Anti-infectives: Anti-infectives (From admission, onward)   Start     Dose/Rate Route Frequency Ordered Stop   07/03/19 1400  piperacillin-tazobactam (ZOSYN) IVPB 3.375 g     3.375 g 12.5 mL/hr over 240 Minutes Intravenous Every 8 hours 07/03/19 1005     07/03/19 1015  piperacillin-tazobactam (ZOSYN) IVPB 3.375 g  Status:  Discontinued     3.375 g 12.5 mL/hr over 240 Minutes Intravenous Every 8 hours 07/03/19 1005 07/03/19 1005   07/03/19 0900  cefTRIAXone (ROCEPHIN) 1 g in sodium chloride 0.9 % 100 mL IVPB     1 g 200 mL/hr over 30 Minutes Intravenous  Once 07/02/19 1351 07/03/19 0936   07/02/19 0830  cefTRIAXone (ROCEPHIN) 1 g in sodium chloride 0.9 % 100 mL IVPB     1 g 200 mL/hr over 30 Minutes Intravenous  Once 07/02/19 0752 07/02/19 1117      Assessment/Plan: s/p Procedure(s): EXPLORATORY LAPAROTOMY (N/A) Small Bowel Resection (N/A) by CC POD#2 Appears bowel function has returned.  D/c NG and start clears Zosyn for peritonitis Phenol spray PRN for throat Pack wound BID Labs in AM  LOS: 8 days    Rosario Adie [phone]

## 2019-07-11 DIAGNOSIS — B360 Pityriasis versicolor: Secondary | ICD-10-CM

## 2019-07-11 DIAGNOSIS — E8809 Other disorders of plasma-protein metabolism, not elsewhere classified: Secondary | ICD-10-CM

## 2019-07-11 LAB — COMPREHENSIVE METABOLIC PANEL
ALT: 21 U/L (ref 0–44)
AST: 29 U/L (ref 15–41)
Albumin: 1.4 g/dL — ABNORMAL LOW (ref 3.5–5.0)
Alkaline Phosphatase: 68 U/L (ref 38–126)
Anion gap: 9 (ref 5–15)
BUN: 7 mg/dL (ref 6–20)
CO2: 24 mmol/L (ref 22–32)
Calcium: 8.3 mg/dL — ABNORMAL LOW (ref 8.9–10.3)
Chloride: 107 mmol/L (ref 98–111)
Creatinine, Ser: 0.73 mg/dL (ref 0.44–1.00)
GFR calc Af Amer: >60 mL/min (ref >60)
GFR calc non Af Amer: >60 mL/min (ref >60)
Glucose, Bld: 118 mg/dL — ABNORMAL HIGH (ref 70–99)
Potassium: 3 mmol/L — ABNORMAL LOW (ref 3.5–5.1)
Sodium: 140 mmol/L (ref 135–145)
Total Bilirubin: 0.8 mg/dL (ref 0.3–1.2)
Total Protein: 4 g/dL — ABNORMAL LOW (ref 6.5–8.1)

## 2019-07-11 LAB — CBC WITH DIFFERENTIAL/PLATELET
Abs Immature Granulocytes: 0.42 10*3/uL — ABNORMAL HIGH (ref 0.00–0.07)
Basophils Absolute: 0 10*3/uL (ref 0.0–0.1)
Basophils Relative: 0 %
Eosinophils Absolute: 0.1 10*3/uL (ref 0.0–0.5)
Eosinophils Relative: 1 %
HCT: 23.4 % — ABNORMAL LOW (ref 36.0–46.0)
Hemoglobin: 7.6 g/dL — ABNORMAL LOW (ref 12.0–15.0)
Immature Granulocytes: 3 %
Lymphocytes Relative: 12 %
Lymphs Abs: 2.1 10*3/uL (ref 0.7–4.0)
MCH: 31 pg (ref 26.0–34.0)
MCHC: 32.5 g/dL (ref 30.0–36.0)
MCV: 95.5 fL (ref 80.0–100.0)
Monocytes Absolute: 1.2 10*3/uL — ABNORMAL HIGH (ref 0.1–1.0)
Monocytes Relative: 7 %
Neutro Abs: 13.2 10*3/uL — ABNORMAL HIGH (ref 1.7–7.7)
Neutrophils Relative %: 77 %
Platelets: 202 10*3/uL (ref 150–400)
RBC: 2.45 MIL/uL — ABNORMAL LOW (ref 3.87–5.11)
RDW: 13.8 % (ref 11.5–15.5)
WBC: 17 10*3/uL — ABNORMAL HIGH (ref 4.0–10.5)
nRBC: 0 % (ref 0.0–0.2)

## 2019-07-11 LAB — SURGICAL PATHOLOGY

## 2019-07-11 MED ORDER — POTASSIUM CHLORIDE CRYS ER 20 MEQ PO TBCR
40.0000 meq | EXTENDED_RELEASE_TABLET | Freq: Two times a day (BID) | ORAL | Status: DC
  Administered 2019-07-11 (×2): 40 meq via ORAL
  Filled 2019-07-11 (×2): qty 2

## 2019-07-11 MED ORDER — ITRACONAZOLE 100 MG PO CAPS
200.0000 mg | ORAL_CAPSULE | Freq: Every day | ORAL | Status: DC
  Filled 2019-07-11: qty 2

## 2019-07-11 MED ORDER — METHOCARBAMOL 500 MG PO TABS
500.0000 mg | ORAL_TABLET | Freq: Three times a day (TID) | ORAL | Status: DC | PRN
  Administered 2019-07-11: 22:00:00 500 mg via ORAL
  Filled 2019-07-11: qty 1

## 2019-07-11 MED ORDER — OXYCODONE HCL 5 MG PO TABS
5.0000 mg | ORAL_TABLET | ORAL | Status: DC | PRN
  Administered 2019-07-12 – 2019-07-18 (×4): 5 mg via ORAL
  Filled 2019-07-11 (×5): qty 1

## 2019-07-11 MED ORDER — ACETAMINOPHEN 325 MG PO TABS
650.0000 mg | ORAL_TABLET | Freq: Four times a day (QID) | ORAL | Status: DC | PRN
  Administered 2019-07-11 – 2019-07-13 (×3): 650 mg via ORAL
  Filled 2019-07-11 (×3): qty 2

## 2019-07-11 MED ORDER — ADULT MULTIVITAMIN W/MINERALS CH
1.0000 | ORAL_TABLET | Freq: Every day | ORAL | Status: DC
  Administered 2019-07-11 – 2019-07-18 (×7): 1 via ORAL
  Filled 2019-07-11 (×7): qty 1

## 2019-07-11 MED ORDER — BOOST / RESOURCE BREEZE PO LIQD CUSTOM
1.0000 | Freq: Two times a day (BID) | ORAL | Status: DC
  Administered 2019-07-11 (×2): 1 via ORAL

## 2019-07-11 MED ORDER — HYDROMORPHONE HCL 1 MG/ML IJ SOLN
0.5000 mg | INTRAMUSCULAR | Status: DC | PRN
  Administered 2019-07-11 – 2019-07-12 (×2): 0.5 mg via INTRAVENOUS
  Filled 2019-07-11 (×2): qty 1

## 2019-07-11 MED ORDER — FLUCONAZOLE 100 MG PO TABS
300.0000 mg | ORAL_TABLET | Freq: Once | ORAL | Status: AC
  Administered 2019-07-11: 12:00:00 300 mg via ORAL
  Filled 2019-07-11: qty 3

## 2019-07-11 MED ORDER — ENSURE ENLIVE PO LIQD
237.0000 mL | Freq: Three times a day (TID) | ORAL | Status: DC
  Administered 2019-07-11 – 2019-07-18 (×9): 237 mL via ORAL

## 2019-07-11 NOTE — Progress Notes (Signed)
Initial Nutrition Assessment  DOCUMENTATION CODES:   Obesity unspecified  INTERVENTION:   -D/c Boost Breeze po TID, each supplement provides 250 kcal and 9 grams of protein -MVI with minerals daily -Ensure Enlive po TID, each supplement provides 350 kcal and 20 grams of protein  NUTRITION DIAGNOSIS:   Increased nutrient needs related to post-op healing as evidenced by estimated needs.  GOAL:   Patient will meet greater than or equal to 90% of their needs  MONITOR:   PO intake, Supplement acceptance, Labs, Weight trends, Skin, I & O's  REASON FOR ASSESSMENT:   Consult Assessment of nutrition requirement/status  ASSESSMENT:   Patient is a 39 year old female with past medical history of anemia, depression, hypothyroidism, PTSD who presented with one day history of abdominal pain and was found to have small bowel obstruction and a urinary tract infection.  Pt admitted with SBO.   12/26- NGT placed, connected to low intermittent suction 12/20- abdominal x-ray with persistent dilated small bowel 1/1- s/p Procedure performed: Exploratory laparotomy, small bowel resection 1/3- NGT d/c, advanced to clear liquid diet 1/4- advanced to soft diet  Reviewed I/O's: +1.9 L x 24 hours and +22.4 L since admission  Per general surgery notes, intraoperative findings were closed-loop obstruction with necrotic perforated small bowel and frank spillage of copious succus.   Pt lying in bed with blankets covering her head. She did not respond to her name being called. Noted completed Boost Breeze on tray table.   Pt with poor appetite; noted meal completion 25%. She was just advanced to a soft diet today.    Reviewed wt hx, which reveals wt stability PTA. Noted increase in UBW (+22.4 L since admission, which may be contributing to wt status).   Medications reviewed and include KCl and lactated ringers infusion @ 75 ml/hr.   Albumin has a half-life of 21 days and is strongly affected by  stress response and inflammatory process, therefore, do not expect to see an improvement in this lab value during acute hospitalization. When a patient presents with low albumin, it is likely skewed due to the acute inflammatory response.  Unless it is suspected that patient had poor PO intake or malnutrition prior to admission, then RD should not be consulted solely for low albumin. Note that low albumin is no longer used to diagnose malnutrition; [place] uses the new malnutrition guidelines published by the American Society for Parenteral and Enteral Nutrition (A.S.P.E.N.) and the Academy of Nutrition and Dietetics (AND).   Labs reviewed.   Diet Order:   Diet Order            DIET SOFT Room service appropriate? Yes; Fluid consistency: Thin  Diet effective now              EDUCATION NEEDS:   No education needs have been identified at this time  Skin:  Skin Assessment: Skin Integrity Issues: Skin Integrity Issues:: Incisions Incisions: closed abdomen  Last BM:  07/11/19  Height:   Ht Readings from Last 1 Encounters:  07/02/19 5' (1.524 m)    Weight:   Wt Readings from Last 1 Encounters:  07/02/19 74.4 kg    Ideal Body Weight:  45.5 kg  BMI:  Body mass index is 32.03 kg/m.  Estimated Nutritional Needs:   Kcal:  2050-2250  Protein:  105-120 grams  Fluid:  > 2 L    Chealsea Paske A. Jimmye Norman, RD, LDN, [place] Registered Dietitian II Certified Diabetes Care and Education Specialist Pager: [phone] After hours Pager:  [phone] 

## 2019-07-11 NOTE — TOC Progression Note (Signed)
Transition of Care [facility]) - Progression Note    Patient Details  Name: Theresa Mccoy MRN: [id number] Date of Birth: [birth year]  Transition of Care [facility]) CM/SW Contact  Bartholomew Crews, RN Phone Number: [phone] 07/11/2019, 5:18 PM  Clinical Narrative:    Noted patient will need Jeddito RN at discharge to assist with dressing change and monitor wound. Spoke with nurse about teaching family or support person how to do dressing change which is BID - HH RN will only come to home 1-2 times per week.         Expected Discharge Plan and Services           Expected Discharge Date: 07/01/19                                     Social Determinants of Health (SDOH) Interventions    Readmission Risk Interventions No flowsheet data found.

## 2019-07-11 NOTE — Progress Notes (Addendum)
Subjective:   Theresa Mccoy reports her abdomen is tender this AM. It has made it difficult to move around and get up from bed. She is tolerating her diet well and is looking forward to advancing her diet. No other pain at this time.   Objective:  Vital signs in last 24 hours: Vitals:   07/10/19 1135 07/10/19 1411 07/10/19 2041 07/11/19 0411  BP: 128/72 (!) 142/91 118/71 134/80  Pulse: 99 (!) 105 84 90  Resp: 18 20 19    Temp: (!) 97.5 F (36.4 C) 98.9 F (37.2 C) 99.9 F (37.7 C) 98.8 F (37.1 C)  TempSrc: Oral Oral Oral Oral  SpO2: 96% 97% 100% 97%  Weight:      Height:        Physical Exam Vitals and nursing note reviewed.  Constitutional:      General: She is not in acute distress.    Appearance: She is normal weight.  Pulmonary:     Effort: Pulmonary effort is normal. No respiratory distress.  Abdominal:     General: Bowel sounds are decreased. There is distension.     Palpations: Abdomen is soft.     Tenderness: There is generalized abdominal tenderness. There is no guarding.  Skin:    General: Skin is warm and dry.  Neurological:     Mental Status: She is alert.    Assessment/Plan:  Principal Problem:   SBO (small bowel obstruction) (HCC) Active Problems:   Urinary tract infection   Hypercalcemia   Primary hyperparathyroidism ([place])  Theresa Mccoy a 39 year old female with past medical history of anemia, depression, hypothyroidism, PTSD who presented with 1 day history of abdominal pain and was found to have small bowel obstruction and a urinary tract infection.  # Small bowel obstruction: Patient presented with abdominal pain and found to have a SBO on imaging. Conservative management attempted for 6 days but failed. Zosyn empirically started 12/27. Taken for Ex-lap on 07/07/2018 that showed closed loop obstruction with 50 cm of necrotic, perforated small bowel and frank spillage of copious succus, SB resection and anastomosis was completed. NGT  removed on 07/09/2018.   Leukocytosis stable today with stable vital signs. Advancing diet to soft. If any fevers develop, low threshold for CT abdomen due to concern for abscess formation.  - General surgery is on board and we appreciate their recommendations  - IVF maintenance: LR @ 75 cc/hr - Zosyn 3.375 mg q8h  -Tylenol 650 mg every 6 hoursPRNfor mild pain/fever - Oxycodone 5 q4h  - Dilaudid for breakthrough pain q2h -Zofran 4 mg every 6 hoursPRN  - Advance diet as tolerated   # Primary Hyperparathyroidism: # Hypercalcemia:  Given her age and degree of elevation she will likely need surgical intervention. Calcium has responded well to bisphosphonate dose given on 07/05/2019. Today, corrected Ca is 11.4. 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 Discussed with Theresa Pitch, PA who will discus with surgeon, however suspects unlikely for parathyroidectomy to occur during this hospitalization.   - CMP daily  # Hypoalbuminemia  Likely nutritional deficit.   - Boost  - Dietician consult   # Hypokalemia  Secondary to GI losses. Monitor daily. Replenish PRN.   - CMP daily  # NormocyticAnemia: Workup indicated anemia of chronic disease. Monitoring closely since it has steadily decreased during admission. Hemoglobin dropped slightly today to 7.6. No signs of active bleeding at this time though and VSS.   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 Given how wide spread it is (arms, back, legs), will treat with oral anti-fungals.   - Itraconazole 200 mg for 5 days.   # Urinary tract infection:Resolved  Urinalysis revealed moderate leukocytes, greater than 50 white blood cells, many bacteria;patient reported pain with urination initially. Urine cultureshows >= 100,000 colonies GNR,speciated as E. Coli that is sensitive to Zosyn.  #AKI 2/2 hypovolemia:  Resolved  Dispo: Anticipated dischargepending clinical improvement.  Dr. Jose Persia Internal Medicine PGY-1  Pager: [phone] 07/11/2019, 9:53 AM

## 2019-07-11 NOTE — Progress Notes (Signed)
Patient refused to ambulate tonight. Arthor Captain LPN

## 2019-07-11 NOTE — Plan of Care (Signed)

## 2019-07-11 NOTE — Progress Notes (Signed)
Dressing changed as ordered. Pt tolerated well. Will continue to monitor.

## 2019-07-11 NOTE — Progress Notes (Signed)
[facility] Progress Note  3 Days Post-Op  Subjective: CC-  Tolerating full liquids but not eating a lot. Denies n/v. Multiple BMs yesterday. Not getting out of bed very often. States that this hurts and it is difficult to move. WBC remains elevated at 17, TMAX 99.9  Objective: Vital signs in last 24 hours: Temp:  [97.5 F (36.4 C)-99.9 F (37.7 C)] 98.8 F (37.1 C) (01/04 0411) Pulse Rate:  [84-105] 90 (01/04 0411) Resp:  [18-20] 19 (01/03 2041) BP: (118-142)/(71-91) 134/80 (01/04 0411) SpO2:  [96 %-100 %] 97 % (01/04 0411) Last BM Date: 07/10/19  Intake/Output from previous day: 01/03 0701 - 01/04 0700 In: 1877.1 [P.O.:240; I.V.:1485.7; IV Piggyback:151.4] Out: -  Intake/Output this shift: No intake/output data recorded.  PE: Gen:  Alert, NAD, pleasant HEENT: EOM's intact, pupils equal and round Pulm:  Rate and effort normal Abd: Soft, mild distension, +BS, open midline incision pink without erythema or drainage/ no cellulitits Skin: no rashes noted, warm and dry  Lab Results:  Recent Labs    07/10/19 0141 07/11/19 0548  WBC 16.8* 17.0*  HGB 8.6* 7.6*  HCT 25.6* 23.4*  PLT 191 202   BMET Recent Labs    07/10/19 0141 07/11/19 0548  NA 144 140  K 3.0* 3.0*  CL 109 107  CO2 26 24  GLUCOSE 88 118*  BUN 12 7  CREATININE 0.78 0.73  CALCIUM 8.7* 8.3*   PT/INR No results for input(s): LABPROT, INR in the last 72 hours. CMP     Component Value Date/Time   NA 140 07/11/2019 0548   NA 143 03/02/2017 1556   K 3.0 (L) 07/11/2019 0548   CL 107 07/11/2019 0548   CO2 24 07/11/2019 0548   GLUCOSE 118 (H) 07/11/2019 0548   BUN 7 07/11/2019 0548   BUN 9 03/02/2017 1556   CREATININE 0.73 07/11/2019 0548   CALCIUM 8.3 (L) 07/11/2019 0548   PROT 4.0 (L) 07/11/2019 0548   PROT 6.7 03/02/2017 1556   ALBUMIN 1.4 (L) 07/11/2019 0548   ALBUMIN 4.0 03/02/2017 1556   AST 29 07/11/2019 0548   ALT 21 07/11/2019 0548   ALKPHOS 68 07/11/2019 0548   BILITOT 0.8 07/11/2019 0548   BILITOT 0.2 03/02/2017 1556   GFRNONAA >60 07/11/2019 0548   GFRAA >60 07/11/2019 0548   Lipase     Component Value Date/Time   LIPASE 20 07/01/2019 2141       Studies/Results: NM Parathyroid W/Spect  Result Date: 07/09/2019 CLINICAL DATA:  Evaluate for parathyroid adenoma. Hyperparathyroidism. EXAM: NM PARATHYROID SCINTIGRAPHY AND SPECT IMAGING TECHNIQUE: Following intravenous administration of radiopharmaceutical, early and 2-hour delayed planar images were obtained in the anterior projection. Delayed triplanar SPECT images were also obtained at 2 hours. RADIOPHARMACEUTICALS:  25.4 mCi Tc-41m Sestamibi IV COMPARISON:  None. FINDINGS: On the early images there is mild increased radiotracer uptake identified within both lobes of thyroid gland. On the 2 hour delayed images there is a small, persistent focus of mild increased uptake localizing to the area around the inferior pole of right lobe of thyroid gland. IMPRESSION: Small focus of mildly persistent uptake localizing to the inferior pole of right lobe of thyroid gland. 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 Electronically Signed   By: Kerby Moors M.D.   On: 07/09/2019 12:36    Anti-infectives: Anti-infectives (From admission, onward)   Start     Dose/Rate Route Frequency Ordered Stop   07/03/19 1400  piperacillin-tazobactam (ZOSYN) IVPB 3.375 g  3.375 g 12.5 mL/hr over 240 Minutes Intravenous Every 8 hours 07/03/19 1005     07/03/19 1015  piperacillin-tazobactam (ZOSYN) IVPB 3.375 g  Status:  Discontinued     3.375 g 12.5 mL/hr over 240 Minutes Intravenous Every 8 hours 07/03/19 1005 07/03/19 1005   07/03/19 0900  cefTRIAXone (ROCEPHIN) 1 g in sodium chloride 0.9 % 100 mL IVPB     1 g 200 mL/hr over 30 Minutes Intravenous  Once 07/02/19 1351 07/03/19 0936   07/02/19 0830  cefTRIAXone (ROCEPHIN) 1 g in sodium chloride 0.9 % 100 mL IVPB     1 g 200 mL/hr over 30 Minutes  Intravenous  Once 07/02/19 [id number] 07/02/19 1117       Assessment/Plan Depression Hypercalcemia/elevated 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 Will discuss with MD, but likely plan for outpatient follow up with Dr Harlow Asa or Endocrinology  Closed-loop obstruction with necrotic perforated small bowel and frank spillage of copious succus S/p Exploratory laparotomy, small bowel resection 1/1 Dr. Kae Heller  - POD #3 - BID wet to dry dressing changes to midline abdominal wound - zosyn for peritonitis - starting to have bowel function, tolerating fulls liquids  ID - zosyn 12/27>>day#9 FEN - IVF, soft diet VTE - SCDs, lovenox Foley - none Follow up - Dr. Kae Heller  Plan - Replace K. Advance to soft diet and add Boost. PT consult. Needs to mobilize more. Home health order placed for RN assistance with wound care.   LOS: 9 days    [facility] 07/11/2019, 8:36 AM Please see Amion for pager number during day hours 7:00am-4:30pm

## 2019-07-12 DIAGNOSIS — Z9889 Other specified postprocedural states: Secondary | ICD-10-CM

## 2019-07-12 DIAGNOSIS — D62 Acute posthemorrhagic anemia: Secondary | ICD-10-CM

## 2019-07-12 LAB — CBC
HCT: 27.5 % — ABNORMAL LOW (ref 36.0–46.0)
Hemoglobin: 9.1 g/dL — ABNORMAL LOW (ref 12.0–15.0)
MCH: 30.4 pg (ref 26.0–34.0)
MCHC: 33.1 g/dL (ref 30.0–36.0)
MCV: 92 fL (ref 80.0–100.0)
Platelets: 228 10*3/uL (ref 150–400)
RBC: 2.99 MIL/uL — ABNORMAL LOW (ref 3.87–5.11)
RDW: 14.6 % (ref 11.5–15.5)
WBC: 22.4 10*3/uL — ABNORMAL HIGH (ref 4.0–10.5)
nRBC: 0 % (ref 0.0–0.2)

## 2019-07-12 LAB — COMPREHENSIVE METABOLIC PANEL
ALT: 21 U/L (ref 0–44)
AST: 20 U/L (ref 15–41)
Albumin: 1.4 g/dL — ABNORMAL LOW (ref 3.5–5.0)
Alkaline Phosphatase: 53 U/L (ref 38–126)
Anion gap: 5 (ref 5–15)
BUN: <5 mg/dL — ABNORMAL LOW (ref 6–20)
CO2: 24 mmol/L (ref 22–32)
Calcium: 8.6 mg/dL — ABNORMAL LOW (ref 8.9–10.3)
Chloride: 111 mmol/L (ref 98–111)
Creatinine, Ser: 0.59 mg/dL (ref 0.44–1.00)
GFR calc Af Amer: >60 mL/min (ref >60)
GFR calc non Af Amer: >60 mL/min (ref >60)
Glucose, Bld: 120 mg/dL — ABNORMAL HIGH (ref 70–99)
Potassium: 3 mmol/L — ABNORMAL LOW (ref 3.5–5.1)
Sodium: 140 mmol/L (ref 135–145)
Total Bilirubin: 0.2 mg/dL — ABNORMAL LOW (ref 0.3–1.2)
Total Protein: 4.1 g/dL — ABNORMAL LOW (ref 6.5–8.1)

## 2019-07-12 LAB — CBC WITH DIFFERENTIAL/PLATELET
Abs Immature Granulocytes: 0.3 10*3/uL — ABNORMAL HIGH (ref 0.00–0.07)
Basophils Absolute: 0 10*3/uL (ref 0.0–0.1)
Basophils Relative: 0 %
Eosinophils Absolute: 0.2 10*3/uL (ref 0.0–0.5)
Eosinophils Relative: 1 %
HCT: 18.8 % — ABNORMAL LOW (ref 36.0–46.0)
Hemoglobin: 6.3 g/dL — CL (ref 12.0–15.0)
Immature Granulocytes: 2 %
Lymphocytes Relative: 14 %
Lymphs Abs: 2.4 10*3/uL (ref 0.7–4.0)
MCH: 31 pg (ref 26.0–34.0)
MCHC: 33.5 g/dL (ref 30.0–36.0)
MCV: 92.6 fL (ref 80.0–100.0)
Monocytes Absolute: 1.2 10*3/uL — ABNORMAL HIGH (ref 0.1–1.0)
Monocytes Relative: 7 %
Neutro Abs: 12.8 10*3/uL — ABNORMAL HIGH (ref 1.7–7.7)
Neutrophils Relative %: 76 %
Platelets: 201 10*3/uL (ref 150–400)
RBC: 2.03 MIL/uL — ABNORMAL LOW (ref 3.87–5.11)
RDW: 13.6 % (ref 11.5–15.5)
WBC: 17 10*3/uL — ABNORMAL HIGH (ref 4.0–10.5)
nRBC: 0 % (ref 0.0–0.2)

## 2019-07-12 LAB — C DIFFICILE QUICK SCREEN W PCR REFLEX
C Diff antigen: NEGATIVE
C Diff interpretation: NOT DETECTED
C Diff toxin: NEGATIVE

## 2019-07-12 LAB — PREPARE RBC (CROSSMATCH)

## 2019-07-12 LAB — MAGNESIUM: Magnesium: 1.5 mg/dL — ABNORMAL LOW (ref 1.7–2.4)

## 2019-07-12 LAB — ABO/RH: ABO/RH(D): O POS

## 2019-07-12 MED ORDER — MAGNESIUM OXIDE 400 (241.3 MG) MG PO TABS
400.0000 mg | ORAL_TABLET | Freq: Two times a day (BID) | ORAL | Status: AC
  Administered 2019-07-12 – 2019-07-13 (×4): 400 mg via ORAL
  Filled 2019-07-12 (×4): qty 1

## 2019-07-12 MED ORDER — POTASSIUM CHLORIDE CRYS ER 20 MEQ PO TBCR
60.0000 meq | EXTENDED_RELEASE_TABLET | Freq: Two times a day (BID) | ORAL | Status: AC
  Administered 2019-07-12 (×2): 60 meq via ORAL
  Filled 2019-07-12 (×2): qty 3

## 2019-07-12 MED ORDER — SODIUM CHLORIDE 0.9% IV SOLUTION: Freq: Once | INTRAVENOUS | Status: AC

## 2019-07-12 NOTE — Plan of Care (Signed)

## 2019-07-12 NOTE — Progress Notes (Addendum)
Subjective:   Theresa Mccoy reports her abdomen is still tender today and she feels like there is "something" on the flanks bilaterally. However, she has been up and able to ambulate to her chair. She denies feeling feverish, chills, palpitations. She continues to have daily BMs. No N/V or difficulty advancing diet.   We discussed plan for transfusion. All questions and concerns addressed.   Objective:  Vital signs in last 24 hours: Vitals:   07/11/19 2225 07/12/19 0453 07/12/19 1153 07/12/19 1208  BP: (!) 147/82 138/90 137/86 140/84  Pulse: 92 82 74 68  Resp: (!) 22 20 19 19   Temp: 98.8 F (37.1 C) 98.4 F (36.9 C) 97.9 F (36.6 C) 98.4 F (36.9 C)  TempSrc: Oral Oral Oral Oral  SpO2: 98% 98% 100% 100%  Weight:      Height:       Physical Exam Vitals and nursing note reviewed.  Constitutional:      General: She is not in acute distress.    Appearance: She is normal weight.  Cardiovascular:     Rate and Rhythm: Normal rate and regular rhythm.     Heart sounds: No murmur.  Pulmonary:     Effort: Pulmonary effort is normal. No respiratory distress.  Abdominal:     General: Bowel sounds are decreased. There is distension.     Tenderness: There is generalized abdominal tenderness.  Skin:    General: Skin is warm and dry.  Neurological:     General: No focal deficit present.     Mental Status: She is alert and oriented to person, place, and time.  Psychiatric:        Mood and Affect: Mood normal.        Behavior: Behavior normal.    Assessment/Plan:  Principal Problem:   SBO (small bowel obstruction) (HCC) Active Problems:   Urinary tract infection   Hypercalcemia   Primary hyperparathyroidism ([place])  Theresa Mccoy a 39 year old female with past medical history of anemia, depression, hypothyroidism, PTSD who presented with 1 day history of abdominal pain and was found to have small bowel obstruction and a urinary tract infection.  # Small bowel  obstruction: Conservative managementattempted for 6 days but failed. Zosyn empirically started 12/27. Taken for Ex-lap on 07/07/2018 that showed closed loop obstruction with50 cm ofnecrotic,perforated small bowel and frank spillage of copious succus; small bowel resection and anastomosis were completed. NGT removed on 07/10/2019.   Febrile to 101.1 on 1/4 with acute hemoglobin drop this AM. Concerned for intraabdominal abscess given her high risk. Due to this, ordered a CT A/P with/without. General surgery has recommended deferring imaging at this time. Will defer re-ordering to surgery unless additional fevers develop.   - General surgery is on board and we appreciate their recommendations  - IVF maintenance: LR @ 75cc/hr - Zosyn3.375 mg q8h -Tylenol 650 mg every 6 hoursPRNfor mild pain/fever - Oxycodone 5 q4h  - Dilaudid for breakthrough pain q2h -Zofran 4 mg every 6 hoursPRN  - Advance diet as tolerated   # Primary Hyperparathyroidism: # Hypercalcemia:  Given her age and degree of elevation she will likely need surgical intervention. Calcium has responded well to bisphosphonate dose given on 07/05/2019. Sestamibi scan on 07/09/19 showed uptake in the inferior right lobe consistent with adenoma. No further medical interventions indicated at this time, she will need surgical intervention.   - CMP daily  # Hypoalbuminemia  Likely nutritional deficit. Dietician consult placed on 07/10/2018.   - Boost   #  Hypokalemia  Secondary to GI losses. Persists even with 40 mEq of KCl BID, so will attempt increasing dose to 60 mEq BID  - CMP daily - 60 mEq KCl BID   # NormocyticAnemia: # Acute blood loss anemia Workup indicated anemia of chronic disease. Monitoring closely since it has steadily decreased during admission.Hemoglobin dropped from 7.6 to 6.3, so will order 1 unit of pRBCs. Post-transfusion CBC ordered.   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 Given how wide spread it is (arms, back, legs), will treat with oral anti-fungals. Treated with one time dose of Fluconazole on 07/11/2019.   # Urinary tract infection:Resolved  Urinalysis revealed moderate leukocytes, greater than 50 white blood cells, many bacteria;patient reported pain with urination initially. Urine cultureshows >= 100,000 colonies GNR,speciated as E. Coli that is sensitive to Zosyn.  #AKI 2/2 hypovolemia: Resolved  Dispo: Anticipated dischargepending clinical improvement.   Dr. Jose Persia Internal Medicine PGY-1  Pager: [phone] 07/12/2019, 1:32 PM

## 2019-07-12 NOTE — Progress Notes (Signed)
Dressing changed as ordered. Pt tolerated well. Will continue to monitor.

## 2019-07-12 NOTE — Evaluation (Addendum)
Physical Therapy Evaluation Patient Details Name: Theresa Mccoy MRN: [id number] DOB: [birth year] Today's Date: 07/12/2019   History of Present Illness  Theresa Mccoy is a 39 year old female with past medical history of anemia, depression, hypothyroidism, PTSD who presented with 1 day history of abdominal pain and was found to have small bowel obstruction and a urinary tract infection.  Clinical Impression  Pt admitted for above. She has deficits in balance, strength, pain, gait, and overall functional mobility. She noted that she has a history of balance difficulties at home and that she fell over the weekend in the hospital while trying to get on [facility] because "she just missed it." Educated pt on importance of calling for assistance when getting in and out of bed/chair to avoid future falls. Pt acknowledges understanding. Pt completed LE exercises and tolerated them well. Pt states she is unable to roll at this time due to pain for pericare. Stool dark and loose, and pt states she cannot control bowels at all. She required min assist for bed mobility, min guard for transfers, and min assist for gait. Gait was slow and steady with RW but with repeated cues for sequencing and posture. Recommend HH PT to continue to work on her balance. Pt will benefit from continued skilled acute PT intervention to address deficits.    Follow Up Recommendations Home health PT    Equipment Recommendations  Rolling walker with 5" wheels;3in1 (PT)    Recommendations for Other Services       Precautions / Restrictions Precautions Precautions: Fall Restrictions Weight Bearing Restrictions: No      Mobility  Bed Mobility Overal bed mobility: Needs Assistance Bed Mobility: Supine to Sit   Supine to sit: Min assist     General bed mobility comments: in chair upon arrival  Transfers Overall transfer level: Needs assistance Equipment used: Rolling walker (2 wheeled) Transfers: Stand Pivot Transfers Sit  to Stand: Min guard Stand pivot transfers: Min guard       General transfer comment: min guard for safety to stand pivot to [facility] and back to chair. With increased time and use of RW  Ambulation/Gait Ambulation/Gait assistance: Min assist Gait Distance (Feet): 20 Feet Assistive device: Rolling walker (2 wheeled) Gait Pattern/deviations: Step-to pattern;Decreased stride length;Trunk flexed;Wide base of support Gait velocity: decreased   General Gait Details: pt required min assist for sequencing and walker navigation; cues for posture; pt with slow gait with no LOB but was benefitted by use of RW  Stairs            Wheelchair Mobility    Modified Rankin (Stroke Patients Only)       Balance Overall balance assessment: History of Falls;Needs assistance Sitting-balance support: Single extremity supported;Feet supported Sitting balance-Leahy Scale: Fair Sitting balance - Comments: maintain balance in chair and on BSC   Standing balance support: During functional activity;Bilateral upper extremity supported Standing balance-Leahy Scale: Fair Standing balance comment: requires use of RW for stability during stand pivot but completed standing pericare herself                             Pertinent Vitals/Pain Pain Assessment: Faces Faces Pain Scale: Hurts a little bit    Home Living Family/patient expects to be discharged to:: Private residence Living Arrangements: Spouse/significant other;Children Available Help at Discharge: Family Type of Home: Other(Comment)(states she lives in a "motel that isn't a motel anymore") Home Access: Level entry     Home  Layout: One level Home Equipment: None      Prior Function Level of Independence: Needs assistance   Gait / Transfers Assistance Needed: yes     Comments: pt states she has balance difficulties and sometimes needs help moving around from husband     Hand Dominance        Extremity/Trunk Assessment    Upper Extremity Assessment Upper Extremity Assessment: Defer to OT evaluation    Lower Extremity Assessment Lower Extremity Assessment: Generalized weakness       Communication   Communication: No difficulties  Cognition Arousal/Alertness: Awake/alert Behavior During Therapy: WFL for tasks assessed/performed Overall Cognitive Status: Within Functional Limits for tasks assessed                                        General Comments General comments (skin integrity, edema, etc.): pt noted some irritation on eyelids    Exercises General Exercises - Lower Extremity Ankle Circles/Pumps: AROM;Both;10 reps;Supine Quad Sets: AROM;Both;10 reps;Supine   Assessment/Plan    PT Assessment Patient needs continued PT services  PT Problem List Decreased strength;Decreased activity tolerance;Decreased balance;Decreased mobility;Decreased coordination;Decreased knowledge of use of DME;Decreased safety awareness;Pain       PT Treatment Interventions DME instruction;Gait training;Functional mobility training;Stair training;Therapeutic activities;Therapeutic exercise;Balance training;Patient/family education;Modalities;Wheelchair mobility training    PT Goals (Current goals can be found in the Care Plan section)  Acute Rehab PT Goals Patient Stated Goal: decrease pain PT Goal Formulation: With patient Time For Goal Achievement: 07/26/19 Potential to Achieve Goals: Good    Frequency Min 3X/week   Barriers to discharge        Co-evaluation               AM-PAC PT "6 Clicks" Mobility  Outcome Measure Help needed turning from your back to your side while in a flat bed without using bedrails?: A Little Help needed moving from lying on your back to sitting on the side of a flat bed without using bedrails?: A Little Help needed moving to and from a bed to a chair (including a wheelchair)?: A Little Help needed standing up from a chair using your arms (e.g.,  wheelchair or bedside chair)?: A Little Help needed to walk in hospital room?: A Little Help needed climbing 3-5 steps with a railing? : A Lot 6 Click Score: 17    End of Session Equipment Utilized During Treatment: Gait belt Activity Tolerance: Patient tolerated treatment well Patient left: in chair;with call bell/phone within reach;with chair alarm set Nurse Communication: Mobility status PT Visit Diagnosis: Unsteadiness on feet (R26.81);Other abnormalities of gait and mobility (R26.89);History of falling (Z91.81);Muscle weakness (generalized) (M62.81);Pain Pain - part of body: (abdomen)    Time: [id number] PT Time Calculation (min) (ACUTE ONLY): 10 min   Charges:   PT Evaluation $PT Eval Moderate Complexity: 1 Mod PT Treatments $Therapeutic Exercise: 8-22 mins $Therapeutic Activity: 8-22 mins        Theresa Mccoy, SPT   Yazmen Briones 07/12/2019, 11:50 AM

## 2019-07-12 NOTE — Progress Notes (Signed)
[facility] Progress Note  4 Days Post-Op  Subjective: CC-  About to get up with therapies. States that her abdomen is still sore, about the same as yesterday. Denies n/v. Tolerating diet. She reports having several loose bowel movements yesterday, some bloody. States that at times she cannot control her bowels. WBC remain elevated at 17, TMAX 101.1. denies any new complaints such as CP, SOB, cough, dysuria.  Objective: Vital signs in last 24 hours: Temp:  [98.4 F (36.9 C)-101.1 F (38.4 C)] 98.4 F (36.9 C) (01/05 0453) Pulse Rate:  [77-92] 82 (01/05 0453) Resp:  [18-22] 20 (01/05 0453) BP: (138-147)/(82-90) 138/90 (01/05 0453) SpO2:  [97 %-98 %] 98 % (01/05 0453) Last BM Date: 07/11/19  Intake/Output from previous day: 01/04 0701 - 01/05 0700 In: 550 [P.O.:550] Out: 50 [Urine:50] Intake/Output this shift: No intake/output data recorded.  PE: Gen:  Alert, NAD, pleasant HEENT: EOM's intact, pupils equal and round Pulm:  Rate and effort normal Abd: Soft, mild distension, +BS, open midline incision pink without erythema or drainage/ no cellulitits, mild diffuse tenderness without peritonitis Skin: no rashes noted, warm and dry   Lab Results:  Recent Labs    07/11/19 0548 07/12/19 0629  WBC 17.0* 17.0*  HGB 7.6* 6.3*  HCT 23.4* 18.8*  PLT 202 201   BMET Recent Labs    07/11/19 0548 07/12/19 0629  NA 140 140  K 3.0* 3.0*  CL 107 111  CO2 24 24  GLUCOSE 118* 120*  BUN 7 <5*  CREATININE 0.73 0.59  CALCIUM 8.3* 8.6*   PT/INR No results for input(s): LABPROT, INR in the last 72 hours. CMP     Component Value Date/Time   NA 140 07/12/2019 0629   NA 143 03/02/2017 1556   K 3.0 (L) 07/12/2019 0629   CL 111 07/12/2019 0629   CO2 24 07/12/2019 0629   GLUCOSE 120 (H) 07/12/2019 0629   BUN <5 (L) 07/12/2019 0629   BUN 9 03/02/2017 1556   CREATININE 0.59 07/12/2019 0629   CALCIUM 8.6 (L) 07/12/2019 0629   PROT 4.1 (L) 07/12/2019 0629   PROT 6.7  03/02/2017 1556   ALBUMIN 1.4 (L) 07/12/2019 0629   ALBUMIN 4.0 03/02/2017 1556   AST 20 07/12/2019 0629   ALT 21 07/12/2019 0629   ALKPHOS 53 07/12/2019 0629   BILITOT 0.2 (L) 07/12/2019 0629   BILITOT 0.2 03/02/2017 1556   GFRNONAA >60 07/12/2019 0629   GFRAA >60 07/12/2019 0629   Lipase     Component Value Date/Time   LIPASE 20 07/01/2019 2141       Studies/Results: No results found.  Anti-infectives: Anti-infectives (From admission, onward)   Start     Dose/Rate Route Frequency Ordered Stop   07/11/19 1115  fluconazole (DIFLUCAN) tablet 300 mg     300 mg Oral  Once 07/11/19 1104 07/11/19 1133   07/11/19 1000  itraconazole (SPORANOX) capsule 200 mg  Status:  Discontinued     200 mg Oral Daily 07/11/19 0957 07/11/19 1104   07/03/19 1400  piperacillin-tazobactam (ZOSYN) IVPB 3.375 g     3.375 g 12.5 mL/hr over 240 Minutes Intravenous Every 8 hours 07/03/19 1005     07/03/19 1015  piperacillin-tazobactam (ZOSYN) IVPB 3.375 g  Status:  Discontinued     3.375 g 12.5 mL/hr over 240 Minutes Intravenous Every 8 hours 07/03/19 1005 07/03/19 1005   07/03/19 0900  cefTRIAXone (ROCEPHIN) 1 g in sodium chloride 0.9 % 100 mL IVPB  1 g 200 mL/hr over 30 Minutes Intravenous  Once 07/02/19 1351 07/03/19 0936   07/02/19 0830  cefTRIAXone (ROCEPHIN) 1 g in sodium chloride 0.9 % 100 mL IVPB     1 g 200 mL/hr over 30 Minutes Intravenous  Once 07/02/19 [id number] 07/02/19 1117       Assessment/Plan Depression Hypercalcemia/elevated 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 Plan for outpatient follow up with Dr Harlow Asa or Endocrinology ABL anemia - Hgb 6.3, getting 1 unit PRBCs  Closed-loop obstruction with necroticperforatedsmall bowel and frank spillage of copious succus S/p Exploratory laparotomy, small bowel resection 1/1 Dr. Kae Heller  - POD #4 - BID wet to dry dressing changes to midline abdominal wound - zosyn for peritonitis - tolerating soft diet  ID - zosyn 12/27>>day#10 FEN -  IVF, soft diet VTE - SCDs, lovenox Foley - none Follow up - Dr. Kae Heller  Plan - Check c diff. Likely plan for repeat CT scan in 1-2 days to check for intraabdominal abscess. Continue zosyn. Continue PT, mobilize.  Patient is getting 1 unit PRBCs, may need to hold lovenox if H/H does not stabilize.   LOS: 10 days    [facility] 07/12/2019, 9:19 AM Please see Amion for pager number during day hours 7:00am-4:30pm

## 2019-07-12 NOTE — Progress Notes (Signed)
CRITICAL VALUE ALERT  Critical Value:  HGB 6.3  Date & Time Notied:  0730  Provider Notified:  Dr. Ronnald Ramp  Orders Received/Actions taken: Awaiting

## 2019-07-12 NOTE — TOC Initial Note (Signed)
Transition of Care [facility]) - Initial/Assessment Note    Patient Details  Name: Theresa Mccoy MRN: [id number] Date of Birth: September 12, 1980  [facility]) CM/SW Contact:    Marilu Favre, RN Phone Number: 07/12/2019, 10:37 AM  Clinical Narrative:                 Patient from home with husband and 39 year old son.   Consult for home health RN. Confirmed face sheet information.   Patient has no preference in home health agency. NCM called every home health agency listed for her address on medicare.gov list, no agency is able to accept referral due to staffing. Patient , Meuth PA , bedside nurse aware. Aslo messaged attending , Charleen Kirks and Dr Koleen Distance.   Patient has assisted someone else with wet to dry dressing changes and she will have her 45 year old son come to hospital today to be taught dressing changes.  Expected Discharge Plan: Home/Self Care Barriers to Discharge: Continued Medical Work up   Patient Goals and CMS Choice Patient states their goals for this hospitalization and ongoing recovery are:: to return to home CMS Medicare.gov Compare Post Acute Care list provided to:: Patient Choice offered to / list presented to : Patient  Expected Discharge Plan and Services Expected Discharge Plan: Home/Self Care   Discharge Planning Services: CM Consult Post Acute Care Choice: [place] arrangements for the past 2 months: Apartment Expected Discharge Date: 07/01/19               DME Arranged: N/A                    Prior Living Arrangements/Services Living arrangements for the past 2 months: Apartment Lives with:: Spouse, Adult Children Patient language and need for interpreter reviewed:: Yes Do you feel safe going back to the place where you live?: Yes      Need for Family Participation in Patient Care: Yes (Comment) Care giver support system in place?: Yes (comment)   Criminal Activity/Legal Involvement Pertinent to Current  Situation/Hospitalization: No - Comment as needed  Activities of Daily Living Home Assistive Devices/Equipment: None ADL Screening (condition at time of admission) Patient's cognitive ability adequate to safely complete daily activities?: Yes Is the patient deaf or have difficulty hearing?: No Does the patient have difficulty seeing, even when wearing glasses/contacts?: No Does the patient have difficulty concentrating, remembering, or making decisions?: No Patient able to express need for assistance with ADLs?: Yes Does the patient have difficulty dressing or bathing?: No Independently performs ADLs?: Yes (appropriate for developmental age) Does the patient have difficulty walking or climbing stairs?: No Weakness of Legs: None Weakness of Arms/Hands: None  Permission Sought/Granted   Permission granted to share information with : No              Emotional Assessment Appearance:: Appears stated age Attitude/Demeanor/Rapport: Engaged   Orientation: : Oriented to Self, Oriented to Place, Oriented to  Time, Oriented to Situation Alcohol / Substance Use: Not Applicable Psych Involvement: No (comment)  Admission diagnosis:  Small bowel obstruction ([place]) [K56.609] SBO (small bowel obstruction) ([place]) [K56.609] Encounter for imaging study to confirm nasogastric (NG) tube placement [Z01.89] Acute cystitis with hematuria [N30.01] Not currently pregnant [Z78.9] Patient Active Problem List   Diagnosis Date Noted  . Hypercalcemia 07/07/2019  . Primary hyperparathyroidism ([place]) 07/07/2019  . Urinary tract infection 07/02/2019  . SBO (small bowel obstruction) ([place]) 07/02/2019  . Supervision of high risk pregnancy, antepartum  05/17/2019  . MDD (major depressive disorder), recurrent, severe, with psychosis ([place]) 10/06/2018  . Muscle cramps 02/02/2017  . Bipolar disorder, current episode mixed, moderate ([place]) 02/02/2017  . Generalized abdominal pain 02/02/2017  . Generalized anxiety  disorder 02/02/2017  . Essential hypertension 02/02/2017   PCP:  Kerin Perna, NP Pharmacy:   [facility] [address], [place] [address] Phone: [phone] Fax: [phone]     Social Determinants of Health (SDOH) Interventions    Readmission Risk Interventions No flowsheet data found.

## 2019-07-12 NOTE — Plan of Care (Signed)

## 2019-07-12 NOTE — Progress Notes (Addendum)
Physical Therapy Progress Note  Clinical Impression: Pt admitted for below. Returned after initial evaluation due to pt's need to use BSC. Pt completed stand pivot to [facility] from recliner and back with min guard for assistance. Pt had BM and completed some pericare in standing herself. She is also bleeding but states she notified the physician. Pt left comfortably in recliner with chair alarm on. Pt will continue to benefit from continued skilled PT intervention in order to address deficits.      07/12/19 1036  PT Visit Information  Last PT Received On 07/12/19  Assistance Needed +1  History of Present Illness [name] is a 39 year old female with past medical history of anemia, depression, hypothyroidism, PTSD who presented with 1 day history of abdominal pain and was found to have small bowel obstruction and a urinary tract infection.  Subjective Data  Patient Stated Goal decrease pain  Precautions  Precautions Fall  Restrictions  Weight Bearing Restrictions No  Pain Assessment  Pain Assessment Faces  Faces Pain Scale 2  Cognition  Arousal/Alertness Awake/alert  Behavior During Therapy WFL for tasks assessed/performed  Overall Cognitive Status Within Functional Limits for tasks assessed  Bed Mobility  General bed mobility comments in chair upon arrival  Transfers  Overall transfer level Needs assistance  Equipment used Rolling walker (2 wheeled)  Transfers Stand Pivot Transfers  Stand pivot transfers Min guard  General transfer comment min guard for safety to stand pivot to [facility] and back to chair. With increased time and use of RW  Balance  Overall balance assessment History of Falls;Needs assistance  Sitting-balance support Single extremity supported;Feet supported  Sitting balance-Leahy Scale Fair  Sitting balance - Comments maintain balance in chair and on BSC  Standing balance support During functional activity;Bilateral upper extremity supported  Standing  balance-Leahy Scale Fair  Standing balance comment requires use of RW for stability during stand pivot but completed standing pericare herself  PT - End of Session  Activity Tolerance Patient tolerated treatment well  Patient left in chair;with call bell/phone within reach;with chair alarm set  Nurse Communication Mobility status   PT - Assessment/Plan  PT Plan Current plan remains appropriate  PT Visit Diagnosis Unsteadiness on feet (R26.81);Other abnormalities of gait and mobility (R26.89);History of falling (Z91.81);Muscle weakness (generalized) (M62.81);Pain  Pain - part of body  (abdomen)  PT Frequency (ACUTE ONLY) Min 3X/week  Follow Up Recommendations  Home health PT  PT equipment Rolling walker with 5" wheels;3in1 (PT)  AM-PAC PT "6 Clicks" Mobility Outcome Measure (Version 2)  Help needed turning from your back to your side while in a flat bed without using bedrails? 3  Help needed moving from lying on your back to sitting on the side of a flat bed without using bedrails? 3  Help needed moving to and from a bed to a chair (including a wheelchair)? 3  Help needed standing up from a chair using your arms (e.g., wheelchair or bedside chair)? 3  Help needed to walk in hospital room? 3  Help needed climbing 3-5 steps with a railing?  2  6 Click Score 17  Consider Recommendation of Discharge To: Home with [facility]  PT Goal Progression  Progress towards PT goals Progressing toward goals  Acute Rehab PT Goals  PT Goal Formulation With patient  Time For Goal Achievement 07/26/19  Potential to Achieve Goals Good  PT Time Calculation  PT Start Time (ACUTE ONLY) 0920  PT Stop Time (ACUTE ONLY) 0930  PT Time Calculation (  min) (ACUTE ONLY) 10 min   PT Charges: $ Therapeutic Activity 8-22 mins 1 Visit  Christel Mormon, SPT

## 2019-07-13 ENCOUNTER — Inpatient Hospital Stay (HOSPITAL_COMMUNITY): Payer: Medicaid Other

## 2019-07-13 LAB — CBC WITH DIFFERENTIAL/PLATELET
Abs Immature Granulocytes: 0.37 10*3/uL — ABNORMAL HIGH (ref 0.00–0.07)
Basophils Absolute: 0 10*3/uL (ref 0.0–0.1)
Basophils Relative: 0 %
Eosinophils Absolute: 0.3 10*3/uL (ref 0.0–0.5)
Eosinophils Relative: 1 %
HCT: 25.2 % — ABNORMAL LOW (ref 36.0–46.0)
Hemoglobin: 8.5 g/dL — ABNORMAL LOW (ref 12.0–15.0)
Immature Granulocytes: 2 %
Lymphocytes Relative: 15 %
Lymphs Abs: 3.1 10*3/uL (ref 0.7–4.0)
MCH: 30.2 pg (ref 26.0–34.0)
MCHC: 33.7 g/dL (ref 30.0–36.0)
MCV: 89.7 fL (ref 80.0–100.0)
Monocytes Absolute: 1.7 10*3/uL — ABNORMAL HIGH (ref 0.1–1.0)
Monocytes Relative: 8 %
Neutro Abs: 16.2 10*3/uL — ABNORMAL HIGH (ref 1.7–7.7)
Neutrophils Relative %: 74 %
Platelets: 260 10*3/uL (ref 150–400)
RBC: 2.81 MIL/uL — ABNORMAL LOW (ref 3.87–5.11)
RDW: 14.7 % (ref 11.5–15.5)
WBC: 21.7 10*3/uL — ABNORMAL HIGH (ref 4.0–10.5)
nRBC: 0 % (ref 0.0–0.2)

## 2019-07-13 LAB — BPAM RBC
Blood Product Expiration Date: 202102042359
ISSUE DATE / TIME: 202101051144
Unit Type and Rh: 5100

## 2019-07-13 LAB — TYPE AND SCREEN
ABO/RH(D): O POS
Antibody Screen: NEGATIVE
Unit division: 0

## 2019-07-13 LAB — COMPREHENSIVE METABOLIC PANEL
ALT: 19 U/L (ref 0–44)
AST: 18 U/L (ref 15–41)
Albumin: 1.6 g/dL — ABNORMAL LOW (ref 3.5–5.0)
Alkaline Phosphatase: 67 U/L (ref 38–126)
Anion gap: 5 (ref 5–15)
BUN: <5 mg/dL — ABNORMAL LOW (ref 6–20)
CO2: 22 mmol/L (ref 22–32)
Calcium: 9 mg/dL (ref 8.9–10.3)
Chloride: 112 mmol/L — ABNORMAL HIGH (ref 98–111)
Creatinine, Ser: 0.64 mg/dL (ref 0.44–1.00)
GFR calc Af Amer: >60 mL/min (ref >60)
GFR calc non Af Amer: >60 mL/min (ref >60)
Glucose, Bld: 92 mg/dL (ref 70–99)
Potassium: 4.1 mmol/L (ref 3.5–5.1)
Sodium: 139 mmol/L (ref 135–145)
Total Bilirubin: 0.5 mg/dL (ref 0.3–1.2)
Total Protein: 4.7 g/dL — ABNORMAL LOW (ref 6.5–8.1)

## 2019-07-13 LAB — PROTIME-INR
INR: 1.2 (ref 0.8–1.2)
Prothrombin Time: 14.8 seconds (ref 11.4–15.2)

## 2019-07-13 MED ORDER — IOHEXOL 300 MG/ML  SOLN
100.0000 mL | Freq: Once | INTRAMUSCULAR | Status: AC | PRN
  Administered 2019-07-13: 13:00:00 100 mL via INTRAVENOUS

## 2019-07-13 NOTE — Progress Notes (Signed)
Subjective:   Theresa Mccoy reports she is still having abdominal tenderness but the pain is alleviating by rubbing the affected areas. Her arm pain at the IV site feels better as well. She is working on drinking the contrast this AM for upcoming CT abdomen. Otherwise, no acute complaints.   Objective:  Vital signs in last 24 hours: Vitals:   07/12/19 1153 07/12/19 1208 07/12/19 1445 07/12/19 2211  BP: 137/86 140/84 (!) 152/91 (!) 145/86  Pulse: 74 68 60 78  Resp: 19 19  18   Temp: 97.9 F (36.6 C) 98.4 F (36.9 C) 98.2 F (36.8 C) 98.4 F (36.9 C)  TempSrc: Oral Oral Oral Oral  SpO2: 100% 100% 100% 100%  Weight:      Height:       Physical Exam Vitals and nursing note reviewed.  Constitutional:      General: She is not in acute distress.    Appearance: She is normal weight.  Cardiovascular:     Rate and Rhythm: Normal rate and regular rhythm.     Heart sounds: No murmur.  Pulmonary:     Effort: Pulmonary effort is normal. No respiratory distress.  Abdominal:     General: Bowel sounds are normal. There is distension.  Skin:    General: Skin is warm and dry.  Neurological:     General: No focal deficit present.     Mental Status: She is alert and oriented to person, place, and time.  Psychiatric:        Mood and Affect: Mood normal.        Behavior: Behavior normal.    Assessment/Plan:  Principal Problem:   SBO (small bowel obstruction) (HCC) Active Problems:   Urinary tract infection   Hypercalcemia   Primary hyperparathyroidism ([place])  Theresa Mccoy a 39 year old female with past medical history of anemia, depression, hypothyroidism, PTSD who presented with 1 day history of abdominal pain and was found to have small bowel obstruction and a urinary tract infection.  # Small bowel obstruction: Conservative managementattempted for 6 days but failed. Zosyn empirically started 12/27. Taken for Ex-lap on 07/07/2018 that showed closed loop obstruction with50  cm ofnecrotic,perforated small bowel and frank spillage of copious succus; small bowel resection and anastomosis were completed. NGT removed on 07/10/2019.   Leukocytosis is worsening but afebrile for the past 24 hours. Continues to be on Zosyn but concerning for abscess formation. CT ordered by surgery today.    - General surgery is on board and we appreciate their recommendations  - IVF maintenance: LR @ 75cc/hr - Zosyn3.375 mg q8h -Tylenol 650 mg every 6 hoursPRNfor mild pain/fever - Oxycodone 5 q4h  - Dilaudid for breakthrough pain q2h -Zofran 4 mg every 6 hoursPRN  - Advance diet as tolerated   # Primary Hyperparathyroidism: # Hypercalcemia:  Given her age and degree of elevation she will likely need surgical intervention. Calcium has responded well to bisphosphonate dose given on 07/05/2019. Sestamibi scan on 07/09/19 showed uptake in the inferior right lobe consistent with adenoma. No further medical interventions indicated at this time, she will need surgical intervention.   - CMP daily  # Hypoalbuminemia  Likely nutritional deficit. Dietician consult placed on 07/10/2018.   - Boost   # Hypokalemia  Secondary to GI losses.  - CMP daily - Replenish PRN   # NormocyticAnemia: # Acute blood loss anemia Workup indicated anemia of chronic disease. Monitoring closely since it has steadily decreased during admission.Hemoglobin responded well to 1 unit  of pRBCs on 07/12/2019.   -CBC daily  - Transfuse for hemoglobin <7.0  # Tinea Versicolor  Patient c/o long term history of hypopigmentation consistent with tinea versicolor. Given how wide spread it is (arms, back, legs), will treat with oral anti-fungals. Treated with one time dose of Fluconazole on 07/11/2019.   # Urinary tract infection:Resolved  Urinalysis revealed moderate leukocytes, greater than 50 white blood cells, many bacteria;patient reported pain with urination initially. Urine cultureshows >=  100,000 colonies GNR,speciated as E. Coli that is sensitive to Zosyn.  #AKI 2/2 hypovolemia: Resolved  Dispo:  Discussed case with Dr. Dema Severin, who agrees for general surgery to take over case as primary given there are no active medical management needs. We enjoyed participating in the care of Theresa Mccoy and wish her the best.   Dr. Jose Persia Internal Medicine PGY-1  Pager: [phone] 07/13/2019, 7:07 AM

## 2019-07-13 NOTE — Progress Notes (Signed)
Chief Complaint: Patient was seen in consultation today for intra-abdominal fluid collection  Referring Physician(s): Dr. Dema Severin  Supervising Physician: Jacqulynn Cadet  Patient Status: [facility] - In-pt  History of Present Illness: Theresa Mccoy is a 39 y.o. female with past medical history of PTSD, depression, and cesarean section x2 who presented to [facility] ED with acute onset abdominal pain.  Patient was found to have a SBO which did not resolve with conservative therapy and is now s/p ex lap with small bowel resection at which time she was found to have diffuse feculent peritonitis and necrotic small bowel with significant amount of succus.  Patient has been recovering well since her surgery however with persistent elevated WBC and high risk for post-op complication a CT Abdomen Pelvis was obtained today.   CT Abdomen Pelvis: 1. Distal small bowel anastomosis with no adverse features. No bowel obstruction (oral contrast has reached the hepatic flexure), although the upstream small bowel is mildly dilated.  2. Widespread free fluid in the abdomen and pelvis. Several organizing fluid collections suspicious for developing abscesses, including thin serpiginous collections along the right lateral abdomen and lower quadrant, and a larger more rounded organizing deep pelvic fluid collection exerting some mass effect in the cul-de-sac (approximately 25 mL, see series 3, image 78).  3. Anasarca. Moderate bilateral layering pleural effusions with lower lobe atelectasis.  IR consulted for aspiration and drainage of intra-abdominal fluid collection.  Case reviewed and approved by Dr. Annamaria Boots.   Past Medical History:  Diagnosis Date  . Anemia   . Depression   . Hyperthyroidism   . PTSD (post-traumatic stress disorder)   . SBO (small bowel obstruction) ([place]) 07/07/2019  . Thyroid disease     Past Surgical History:  Procedure Laterality Date  . BOWEL RESECTION N/A 07/08/2019   Procedure:  Small Bowel Resection;  Surgeon: Clovis Riley, MD;  Location: [place];  Service: General;  Laterality: N/A;  . CESAREAN SECTION    . DILATION AND CURETTAGE OF UTERUS    . HAND SURGERY Left   . LAPAROTOMY N/A 07/08/2019   Procedure: EXPLORATORY LAPAROTOMY;  Surgeon: Clovis Riley, MD;  Location: [facility] OR;  Service: General;  Laterality: N/A;  . SALPINGECTOMY      Allergies: Codeine  Medications: Prior to Admission medications   Medication Sig Start Date End Date Taking? Authorizing Provider  gabapentin (NEURONTIN) 300 MG capsule Take 1 capsule (300 mg total) by mouth 3 (three) times daily. 10/11/18  Yes Johnn Hai, MD  Prenatal Vit-Fe Fumarate-FA (PRENATAL MULTIVITAMIN) TABS tablet Take 1 tablet by mouth daily. 05/17/19  Yes Laury Deep, CNM  risperiDONE (RISPERDAL) 2 MG tablet 1 in am 2 at hs Patient taking differently: 2-4 mg 2 (two) times daily. 2mg  in am and 4mg  at hs 10/11/18  Yes Johnn Hai, MD  risperiDONE microspheres (RISPERDAL CONSTA) 50 MG injection Inject 2 mLs (50 mg total) into the muscle every 21 ( twenty-one) days. Due 4/27 10/11/18  Yes Johnn Hai, MD  benztropine (COGENTIN) 0.5 MG tablet Take 1 tablet (0.5 mg total) by mouth 2 (two) times daily. Patient not taking: Reported on 07/02/2019 10/11/18   Johnn Hai, MD  Blood Pressure Monitoring (BLOOD PRESSURE MONITOR AUTOMAT) DEVI 1 Device by Does not apply route daily. Automatic blood pressure cuff with regular cuff. Blood pressure to be monitored regularly at home. ICD-10 code: O09.90. 05/17/19   Donnamae Jude, MD     History reviewed. No pertinent family history.  Social History  Socioeconomic History  . Marital status: Single    Spouse name: Not on file  . Number of children: 2  . Years of education: Not on file  . Highest education level: GED or equivalent  Occupational History  . Occupation: Unemployed  Tobacco Use  . Smoking status: Current Every Day Smoker    Packs/day: 0.25    Types: Cigarettes    . Smokeless tobacco: Never Used  Substance and Sexual Activity  . 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 Drug use: Not Currently    Types: Marijuana  . Sexual activity: Yes    Birth control/protection: None  Other Topics Concern  . Not on file  Social History Narrative   Pt stated that she lives in [place] with her fiance and two children, that she recently moved from [place], and also that she is pregnant.  She also stated that she is unemployed.   Social Determinants of Health   Financial Resource Strain: High Risk  . Difficulty of Paying Living Expenses: Hard  Food Insecurity: No Food Insecurity  . Worried About Charity fundraiser in the Last Year: Never true  . Ran Out of Food in the Last Year: Never true  Transportation Needs: Unmet Transportation Needs  . Lack of Transportation (Medical): Yes  . Lack of Transportation (Non-Medical): Yes  Physical Activity: Inactive  . Days of Exercise per Week: 0 days  . Minutes of Exercise per Session: 0 min  Stress: No Stress Concern Present  . Feeling of Stress : Not at all  Social Connections: Moderately Isolated  . Frequency of Communication with Friends and Family: Once a week  . Frequency of Social Gatherings with Friends and Family: Never  . Attends Religious Services: Never  . Active Member of Clubs or Organizations: No  . Attends Archivist Meetings: Never  . Marital Status: Living with partner     Review of Systems: A 12 point ROS discussed and pertinent positives are indicated in the HPI above.  All other systems are negative.  Review of Systems  Constitutional: Negative for fatigue and fever.  Respiratory: Negative for cough and shortness of breath.   Cardiovascular: Negative for chest pain.  Gastrointestinal: Positive for abdominal pain, diarrhea, nausea and vomiting.  Genitourinary: Negative for dysuria.  Psychiatric/Behavioral: Negative for behavioral problems and confusion.    Vital  Signs: BP (!) 146/91 (BP Location: Left Arm)   Pulse 70   Temp 99.5 F (37.5 C) (Oral)   Resp 18   Ht 5' (1.524 m)   Wt 164 lb (74.4 kg)   SpO2 100%   Breastfeeding Unknown   BMI 32.03 kg/m   Physical Exam Vitals and nursing note reviewed.  Constitutional:      Appearance: She is well-developed.  Cardiovascular:     Rate and Rhythm: Normal rate and regular rhythm.  Pulmonary:     Effort: Pulmonary effort is normal. No respiratory distress.     Breath sounds: Normal breath sounds.  Abdominal:     General: There is no distension.     Palpations: Abdomen is soft.     Tenderness: There is no abdominal tenderness.  Skin:    General: Skin is warm and dry.  Neurological:     General: No focal deficit present.     Mental Status: She is alert and oriented to person, place, and time.  Psychiatric:        Mood and Affect: Mood normal.  Behavior: Behavior normal.      MD Evaluation Airway: WNL Heart: WNL Abdomen: WNL Chest/ Lungs: WNL ASA  Classification: 3 Mallampati/Airway Score: One   Imaging: DG Abd 1 View  Result Date: 07/07/2019 CLINICAL DATA:  Abdominal pain. Concern for small bowel obstruction. EXAM: ABDOMEN - 1 VIEW COMPARISON:  Abdominal radiograph 07/07/2019 at 6:38 a.m. FINDINGS: There are again several dilated loops of small bowel in the left hemiabdomen measuring up to 3.8 cm, similar to the earlier same day study. There is no definite free air. Small right pleural effusion and bibasilar opacities likely representing atelectasis. No acute finding in the visualized skeleton. IMPRESSION: 1. Persistent small bowel dilatation measuring up to 3.8 cm, similar to the earlier same day study, consistent with obstruction. No definite free air. 2. Small right pleural effusion and probable bibasilar atelectasis. Electronically Signed   By: Audie Pinto M.D.   On: 07/07/2019 17:32   DG Abd 1 View  Result Date: 07/05/2019 CLINICAL DATA:  Small bowel obstruction  EXAM: ABDOMEN - 1 VIEW COMPARISON:  07/02/2019 FINDINGS: NG tube remains in the stomach. Improved small bowel distension. Continued mild prominence of left abdominal small bowel loops. No free air organomegaly. IMPRESSION: Improved small bowel obstruction pattern with continued mild prominence of left abdominal small bowel loops. Electronically Signed   By: Rolm Baptise M.D.   On: 07/05/2019 08:16   CT ABDOMEN PELVIS W CONTRAST  Result Date: 07/13/2019 CLINICAL DATA:  39 year old female status post small bowel closed loop obstruction with necrotic perforation. Small bowel resection and exploratory laparotomy postoperative day 5. EXAM: CT ABDOMEN AND PELVIS WITH CONTRAST TECHNIQUE: Multidetector CT imaging of the abdomen and pelvis was performed using the standard protocol following bolus administration of intravenous contrast. CONTRAST:  165mL OMNIPAQUE IOHEXOL 300 MG/ML  SOLN COMPARISON:  Preoperative noncontrast CT Abdomen and Pelvis 07/02/2019 FINDINGS: Lower chest: New bilateral small to moderate layering pleural effusions with enhancing bilateral lower lobe atelectasis. No pericardial effusion. Middle lobes remain clear. Hepatobiliary: Negative liver and gallbladder aside from adjacent free fluid. Pancreas: Negative. Spleen: Negative aside from adjacent free fluid. Adrenals/Urinary Tract: There is a small medial limb left adrenal nodule which was low-density on the prior CT and most compatible with benign adrenal adenoma measuring about 14 millimeters. Negative right adrenal. Bilateral renal enhancement and contrast excretion is symmetric and within normal limits. Bulky right lower pole nephrolithiasis measures 10 millimeters. Proximal ureters are normal. Diminutive and unremarkable urinary bladder. Stomach/Bowel: Decompressed rectosigmoid colon. The sigmoid is redundant tracking into the right lower quadrant. Similar decompressed descending colon, and all of those segments appear mildly thickened and  indistinct. Gas-filled more normal appearing transverse colon is mildly redundant. There is oral contrast in the right colon to the hepatic flexure. Probable appendix on coronal image 66 appears mildly indistinct but not abnormally dilated. The terminal ileum and distal most small bowel are within normal limits. There is a distal small bowel side to side anastomosis in the midline seen on series 3, image 68 and coronal image 49. No adverse features. Upstream small bowel is mildly dilated. The stomach is also moderately distended with gas and residual oral contrast. The duodenum is decompressed. Abdominal and pelvic free fluid and fluid collections are described below. Vascular/Lymphatic: Major arterial structures in the abdomen and pelvis are patent and appear normal. Portal venous system is patent. No lymphadenopathy. Reproductive: Negative. Other: Generalized subcutaneous body wall edema. Ventral abdominal incision healing by secondary intention. In the cul-de-sac there is a rounded fluid collection  with mild rim enhancement and regional mass effect (series 3, image 78) encompassing 26 x 44 x 47 millimeters (AP by transverse by CC). However, the cephalad component of this collection is contiguous with more free fluid in the lower abdomen and pelvic inlet. Superimposed serpiginous rim enhancing fluid collection along the right abdomen inferior to the liver continuing to the right lower quadrant and also somewhat contiguous over the anterior pelvic inlet. The largest components of this collection measure 2 centimeters in thickness. See series 3, images 57 through 72). Superimposed non organized free-fluid elsewhere in the abdomen. No pneumoperitoneum identified. Musculoskeletal: No acute osseous abnormality identified. IMPRESSION: 1. Distal small bowel anastomosis with no adverse features. No bowel obstruction (oral contrast has reached the hepatic flexure), although the upstream small bowel is mildly dilated. 2.  Widespread free fluid in the abdomen and pelvis. Several organizing fluid collections suspicious for developing abscesses, including thin serpiginous collections along the right lateral abdomen and lower quadrant, and a larger more rounded organizing deep pelvic fluid collection exerting some mass effect in the cul-de-sac (approximately 25 mL, see series 3, image 78). 3. Anasarca. Moderate bilateral layering pleural effusions with lower lobe atelectasis. Electronically Signed   By: Genevie Ann M.D.   On: 07/13/2019 12:54   NM Parathyroid W/Spect  Result Date: 07/09/2019 CLINICAL DATA:  Evaluate for parathyroid adenoma. Hyperparathyroidism. EXAM: NM PARATHYROID SCINTIGRAPHY AND SPECT IMAGING TECHNIQUE: Following intravenous administration of radiopharmaceutical, early and 2-hour delayed planar images were obtained in the anterior projection. Delayed triplanar SPECT images were also obtained at 2 hours. RADIOPHARMACEUTICALS:  25.4 mCi Tc-40m Sestamibi IV COMPARISON:  None. FINDINGS: On the early images there is mild increased radiotracer uptake identified within both lobes of thyroid gland. On the 2 hour delayed images there is a small, persistent focus of mild increased uptake localizing to the area around the inferior pole of right lobe of thyroid gland. IMPRESSION: Small focus of mildly persistent uptake localizing to the inferior pole of right lobe of thyroid gland. 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 Electronically Signed   By: Kerby Moors M.D.   On: 07/09/2019 12:36   DG Abd 2 Views  Result Date: 07/04/2019 CLINICAL DATA:  Small bowel obstruction. EXAM: ABDOMEN - 2 VIEW COMPARISON:  07/03/2019 and CT 07/02/2019 FINDINGS: Again noted is a nasogastric tube in the mid abdomen and likely within the distal stomach body region. 1.1 cm calcification overlying the right kidney is compatible with a kidney stone. Few densities at the right lung base that could represent atelectasis or  right pleural effusion. Few gas-filled loops of small bowel in the lower abdomen. There is gas and stool in the colon. No evidence for free air. IMPRESSION: 1. Persistent gas-filled loops of small bowel in the right lower abdomen. There continues to be gas in the rectum and colon. Findings have minimally changed from the previous examination and could represent a partial small bowel obstruction. 2. Densities at the right lung base that could represent atelectasis and cannot exclude a small pleural effusion. 3. Right renal calculus. Electronically Signed   By: Markus Daft M.D.   On: 07/04/2019 08:13   DG Abd 2 Views  Result Date: 07/03/2019 CLINICAL DATA:  Small bowel obstruction. EXAM: ABDOMEN - 2 VIEW COMPARISON:  One-view abdomen 07/02/2019 FINDINGS: The side port of the NG tube is in the stomach. Bowel distention is improving. No significant residual dilated loops of bowel are present. There is no free air. Lung bases are clear. IMPRESSION: 1.  Improving small bowel obstruction. 2. No free air or significant residual dilated loops of bowel. Electronically Signed   By: San Morelle M.D.   On: 07/03/2019 19:12   DG Abd Decub  Result Date: 07/07/2019 CLINICAL DATA:  Abdominal pain. History of small-bowel obstruction. Rule out free air. EXAM: ABDOMEN - 1 VIEW DECUBITUS COMPARISON:  Abdominal radiograph 07/07/2019 at 4:50 p.m. FINDINGS: There is no evidence of free air on decubitus views. Dilated loops of small bowel with air-fluid levels are present. IMPRESSION: No evidence of free intraperitoneal air. Electronically Signed   By: Audie Pinto M.D.   On: 07/07/2019 17:33   DG Abd Portable 1V  Result Date: 07/07/2019 CLINICAL DATA:  Small bowel obstruction EXAM: PORTABLE ABDOMEN - 1 VIEW COMPARISON:  07/05/2019 FINDINGS: There is dilatation of the small bowel with lack of colonic air most concerning for a small bowel obstruction. There is no evidence of pneumoperitoneum, portal venous gas or  pneumatosis. There are no pathologic calcifications along the expected course of the ureters. The osseous structures are unremarkable. IMPRESSION: Persistent small-bowel obstruction. No nasogastric tube is identified. Electronically Signed   By: Kathreen Devoid   On: 07/07/2019 09:34   DG Abd Portable 1V-Small Bowel Obstruction Protocol-initial, 8 hr delay  Result Date: 07/02/2019 CLINICAL DATA:  39 year old female with small bowel obstruction. EXAM: PORTABLE ABDOMEN - 1 VIEW COMPARISON:  Earlier radiograph dated 07/02/2019. FINDINGS: Enteric tube with tip in the midbody or distal stomach. Oral contrast is noted within multiple loops of small bowel in the left hemiabdomen. No passage of contrast identified to the colon on this images. Continued follow-up recommended. The small bowel loops measure up to 2.6 cm in caliber. There is an 8 mm right renal calculus. The osseous structures and soft tissues are unremarkable. IMPRESSION: 1. No passage of contrast to the colon at this time. Continued follow-up recommended. 2. Enteric tube with tip in the midbody or distal stomach. 3. Right renal calculus. Electronically Signed   By: Anner Crete M.D.   On: 07/02/2019 23:47   DG Abd Portable 1V-Small Bowel Protocol-Position Verification  Result Date: 07/02/2019 CLINICAL DATA:  NG tube placement. EXAM: PORTABLE ABDOMEN - 1 VIEW COMPARISON:  None. FINDINGS: An NG tube has been inserted and the tip is in the midbody. Moderate air in the colon. Lung bases are clear. No visible bone abnormality. IMPRESSION: The NG tube tip is in the mid body of the stomach. Electronically Signed   By: Lorriane Shire M.D.   On: 07/02/2019 14:01   CT Renal Stone Study  Addendum Date: 07/02/2019   ADDENDUM REPORT: 07/02/2019 09:16 ADDENDUM: The impression should say multiple dilated small bowel loops in the lower abdomen and pelvis. Findings are consistent with small bowel obstruction. Nonobstructing stone identified in the right  kidney. Electronically Signed   By: Abelardo Diesel M.D.   On: 07/02/2019 09:16   Result Date: 07/02/2019 CLINICAL DATA:  Diffuse abdomen pain EXAM: CT ABDOMEN AND PELVIS WITHOUT CONTRAST TECHNIQUE: Multidetector CT imaging of the abdomen and pelvis was performed following the standard protocol without IV contrast. COMPARISON:  November 01, 2018 FINDINGS: Lower chest: No acute abnormality. Hepatobiliary: No focal liver abnormality is seen. No gallstones, gallbladder wall thickening, or biliary dilatation. Pancreas: Unremarkable. No pancreatic ductal dilatation or surrounding inflammatory changes. Spleen: Normal in size without focal abnormality. Adrenals/Urinary Tract: There is a 7 mm stone probably nonobstructing in 1 of the right renal calyx. There is no hydronephrosis bilaterally. The bilateral adrenal glands are normal.  The bladder is normal. Stomach/Bowel: There are multiple dilated small bowel loops in the lower abdomen and pelvis. The colon is normal. The stomach is normal. The appendix is not seen but no inflammation is noted around cecum. Vascular/Lymphatic: No significant vascular findings are present. No enlarged abdominal or pelvic lymph nodes. Reproductive: Uterus and bilateral adnexa are unremarkable. Other: Ascites is identified in the abdomen. Musculoskeletal: No acute abnormality is identified. IMPRESSION: Multiple dilated small bowel loops in the lower abdomen and pelvis. The findings are consistent with small bowel obstruction. The patella is slightly superiorly located relative to the femoral tibial joint raising the question of superior subluxation. Electronically Signed: By: Abelardo Diesel M.D. On: 07/02/2019 08:41    Labs:  CBC: Recent Labs    07/11/19 0548 07/12/19 0629 07/12/19 1624 07/13/19 0233  WBC 17.0* 17.0* 22.4* 21.7*  HGB 7.6* 6.3* 9.1* 8.5*  HCT 23.4* 18.8* 27.5* 25.2*  PLT 202 201 228 260    COAGS: No results for input(s): INR, APTT in the last 8760  hours.  BMP: Recent Labs    07/10/19 0141 07/11/19 0548 07/12/19 0629 07/13/19 0233  NA 144 140 140 139  K 3.0* 3.0* 3.0* 4.1  CL 109 107 111 112*  CO2 26 24 24 22   GLUCOSE 88 118* 120* 92  BUN 12 7 <5* <5*  CALCIUM 8.7* 8.3* 8.6* 9.0  CREATININE 0.78 0.73 0.59 0.64  GFRNONAA >60 >60 >60 >60  GFRAA >60 >60 >60 >60    LIVER FUNCTION TESTS: Recent Labs    07/10/19 0141 07/11/19 0548 07/12/19 0629 07/13/19 0233  BILITOT 0.9 0.8 0.2* 0.5  AST 19 29 20 18   ALT 17 21 21 19   ALKPHOS 56 68 53 67  PROT 4.2* 4.0* 4.1* 4.7*  ALBUMIN 1.6* 1.4* 1.4* 1.6*    TUMOR MARKERS: No results for input(s): AFPTM, CEA, CA199, CHROMGRNA in the last 8760 hours.  Assessment and Plan: Intra-abdominal fluid collection s/p small bowel resection for perforation and necrotic small bowel with copious amount of succus at time of surgery Patient with persistent WBC elevation and high risk for complication.  CT Abdomen Pelvis today shows an intra-abdominal fluid collection.  IR consulted for aspiration and drainage which has been approved by Dr. Annamaria Boots.  Discussed CT findings with patient as well as plans for aspiration and drainage to which patient reports "I don't know. I don't understand this." Attempted to walk patient through the CT scan, procedures, and current recommendation for drain placement simply and frankly, however she continues to say "I don't understand."  Again tried to assist patient in understanding. It appears she does have an understanding of the care plan and recommendation for drain placement, but will need time to process these recommendations and discuss further which surgical team.  Notified surgery PA of my conversation with the patient.  NPO p MN, hold lovenox, and check INR in anticipation of possible procedure in IR 07/13/18.   Will need to revisit for consent.   Thank you for this interesting consult.  I greatly enjoyed meeting Theresa Mccoy and look forward to  participating in their care.  A copy of this report was sent to the requesting provider on this date.  Electronically Signed: Docia Barrier, PA 07/13/2019, 4:45 PM   I spent a total of 40 Minutes    in face to face in clinical consultation, greater than 50% of which was counseling/coordinating care for intra-abdominal fluid collection.

## 2019-07-13 NOTE — Progress Notes (Signed)
[facility] Progress Note  5 Days Post-Op  Subjective: CC-  Feels about the same. Continues to have some abdominal pain, points to left and right far lateral abdomen. Denies n/v. Continues to have loose BMs. No bright red blood in stool. Tolerating diet but not eating much at all.   Objective: Vital signs in last 24 hours: Temp:  [97.9 F (36.6 C)-98.4 F (36.9 C)] 98.4 F (36.9 C) (01/05 2211) Pulse Rate:  [60-78] 78 (01/05 2211) Resp:  [18-19] 18 (01/05 2211) BP: (137-152)/(84-91) 145/86 (01/05 2211) SpO2:  [100 %] 100 % (01/05 2211) Last BM Date: 07/11/19  Intake/Output from previous day: 01/05 0701 - 01/06 0700 In: 2977.2 [P.O.:120; I.V.:2261; Blood:315; IV Piggyback:281.2] Out: -  Intake/Output this shift: No intake/output data recorded.  PE: Gen: Alert, NAD, pleasant HEENT: EOM's intact, pupils equal and round Pulm:Rate andeffort normal Abd: Soft,mild distension, +BS,open midline incision pink without erythema or drainage/ no cellulitits, mild diffuse tenderness without peritonitis Skin: no rashes noted, warm and dry  Lab Results:  Recent Labs    07/12/19 1624 07/13/19 0233  WBC 22.4* 21.7*  HGB 9.1* 8.5*  HCT 27.5* 25.2*  PLT 228 260   BMET Recent Labs    07/12/19 0629 07/13/19 0233  NA 140 139  K 3.0* 4.1  CL 111 112*  CO2 24 22  GLUCOSE 120* 92  BUN <5* <5*  CREATININE 0.59 0.64  CALCIUM 8.6* 9.0   PT/INR No results for input(s): LABPROT, INR in the last 72 hours. CMP     Component Value Date/Time   NA 139 07/13/2019 0233   NA 143 03/02/2017 1556   K 4.1 07/13/2019 0233   CL 112 (H) 07/13/2019 0233   CO2 22 07/13/2019 0233   GLUCOSE 92 07/13/2019 0233   BUN <5 (L) 07/13/2019 0233   BUN 9 03/02/2017 1556   CREATININE 0.64 07/13/2019 0233   CALCIUM 9.0 07/13/2019 0233   PROT 4.7 (L) 07/13/2019 0233   PROT 6.7 03/02/2017 1556   ALBUMIN 1.6 (L) 07/13/2019 0233   ALBUMIN 4.0 03/02/2017 1556   AST 18 07/13/2019 0233   ALT 19 07/13/2019 0233   ALKPHOS 67 07/13/2019 0233   BILITOT 0.5 07/13/2019 0233   BILITOT 0.2 03/02/2017 1556   GFRNONAA >60 07/13/2019 0233   GFRAA >60 07/13/2019 0233   Lipase     Component Value Date/Time   LIPASE 20 07/01/2019 2141       Studies/Results: No results found.  Anti-infectives: Anti-infectives (From admission, onward)   Start     Dose/Rate Route Frequency Ordered Stop   07/11/19 1115  fluconazole (DIFLUCAN) tablet 300 mg     300 mg Oral  Once 07/11/19 1104 07/11/19 1133   07/11/19 1000  itraconazole (SPORANOX) capsule 200 mg  Status:  Discontinued     200 mg Oral Daily 07/11/19 0957 07/11/19 1104   07/03/19 1400  piperacillin-tazobactam (ZOSYN) IVPB 3.375 g     3.375 g 12.5 mL/hr over 240 Minutes Intravenous Every 8 hours 07/03/19 1005     07/03/19 1015  piperacillin-tazobactam (ZOSYN) IVPB 3.375 g  Status:  Discontinued     3.375 g 12.5 mL/hr over 240 Minutes Intravenous Every 8 hours 07/03/19 1005 07/03/19 1005   07/03/19 0900  cefTRIAXone (ROCEPHIN) 1 g in sodium chloride 0.9 % 100 mL IVPB     1 g 200 mL/hr over 30 Minutes Intravenous  Once 07/02/19 1351 07/03/19 0936   07/02/19 0830  cefTRIAXone (ROCEPHIN) 1 g in sodium  chloride 0.9 % 100 mL IVPB     1 g 200 mL/hr over 30 Minutes Intravenous  Once 07/02/19 [id number] 07/02/19 1117       Assessment/Plan Depression Hypercalcemia/elevated PTH- 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 Plan for outpatient follow up withDrGerkin or Endocrinology ABL anemia - Hgb 8.5, s/p 1 unit PRBCs 1/5  Closed-loop obstruction with necroticperforatedsmall bowel and frank spillage of copious succus S/pExploratory laparotomy, small bowel resection1/1 Dr. Kae Heller  - POD #5 - BID wet to dry dressing changes to midline abdominal wound - zosyn for peritonitis - having bowel function  ID -zosyn 12/27>>day#11 FEN -IVF, soft diet, Ensure VTE -SCDs, lovenox Foley -none Follow up -Dr. Kae Heller  Plan- WBC remains elevated  at 21.7. Repeat CT scan today to evaluate for possible intraabdominal abscess. Continue zosyn for now. PT, mobilize.    LOS: 11 days    [facility] 07/13/2019, 8:33 AM Please see Amion for pager number during day hours 7:00am-4:30pm

## 2019-07-13 NOTE — Progress Notes (Signed)
Physical Therapy Treatment Patient Details Name: Theresa Mccoy MRN: [id number] DOB: 13-Jun-1981 Today's Date: 07/13/2019    History of Present Illness Pt is a 39 y/o female with past medical history of anemia, depression, hypothyroidism, PTSD who presented with 1 day history of abdominal pain and was found to have small bowel obstruction and a urinary tract infection.    PT Comments    Pt reporting that she has been up and ambulating in her room throughout the day. Initially she was agreeable to bed mobility and transfers; however, she never would actually perform them when therapist instructed her to. She was very focused on her abdominal distention and the fluid collection and her constant "draining" of urine and bowels. Pt only performing bed level therex this session (see below). Hopeful for further gait progression at next session as appropriate. Pt would continue to benefit from skilled physical therapy services at this time while admitted and after d/c to address the below listed limitations in order to improve overall safety and independence with functional mobility.    Follow Up Recommendations  Home health PT     Equipment Recommendations  Rolling walker with 5" wheels;3in1 (PT)    Recommendations for Other Services       Precautions / Restrictions Precautions Precautions: Fall Restrictions Weight Bearing Restrictions: No    Mobility  Bed Mobility               General bed mobility comments: pt only agreeable to bed level therex  Transfers                    Ambulation/Gait                 Stairs             Wheelchair Mobility    Modified Rankin (Stroke Patients Only)       Balance                                            Cognition Arousal/Alertness: Awake/alert Behavior During Therapy: Anxious Overall Cognitive Status: Impaired/Different from baseline Area of Impairment: Following  commands;Safety/judgement;Awareness;Problem solving                       Following Commands: Follows one step commands with increased time;Follows one step commands inconsistently Safety/Judgement: Decreased awareness of deficits;Decreased awareness of safety Awareness: Intellectual Problem Solving: Slow processing;Difficulty sequencing;Requires verbal cues        Exercises General Exercises - Lower Extremity Ankle Circles/Pumps: AROM;Both;10 reps;Supine Heel Slides: AROM;Strengthening;Both;20 reps;Supine Hip ABduction/ADduction: AROM;Strengthening;Both;10 reps;Supine Other Exercises Other Exercises: glute bridges x5    General Comments        Pertinent Vitals/Pain Pain Assessment: Faces Faces Pain Scale: Hurts even more Pain Location: stomach Pain Descriptors / Indicators: Grimacing;Guarding Pain Intervention(s): Monitored during session;Repositioned    Home Living                      Prior Function            PT Goals (current goals can now be found in the care plan section) Acute Rehab PT Goals PT Goal Formulation: With patient Time For Goal Achievement: 07/26/19 Potential to Achieve Goals: Good Progress towards PT goals: Progressing toward goals    Frequency    Min 3X/week  PT Plan Current plan remains appropriate    Co-evaluation              AM-PAC PT "6 Clicks" Mobility   Outcome Measure  Help needed turning from your back to your side while in a flat bed without using bedrails?: A Little Help needed moving from lying on your back to sitting on the side of a flat bed without using bedrails?: A Little Help needed moving to and from a bed to a chair (including a wheelchair)?: A Little Help needed standing up from a chair using your arms (e.g., wheelchair or bedside chair)?: A Little Help needed to walk in hospital room?: A Little Help needed climbing 3-5 steps with a railing? : A Lot 6 Click Score: 17    End of  Session   Activity Tolerance: Patient limited by pain;Patient limited by fatigue Patient left: in bed;with call bell/phone within reach Nurse Communication: Mobility status PT Visit Diagnosis: Unsteadiness on feet (R26.81);Other abnormalities of gait and mobility (R26.89);History of falling (Z91.81);Muscle weakness (generalized) (M62.81);Pain Pain - part of body: (abdomen)     Time: [id number] PT Time Calculation (min) (ACUTE ONLY): 23 min  Charges:  $Therapeutic Exercise: 23-37 mins                     Anastasio Champion, DPT  Acute Rehabilitation Services Pager [phone] Office [place] 07/13/2019, 5:40 PM

## 2019-07-13 NOTE — Progress Notes (Signed)
Nutrition Follow-up  RD working remotely.  DOCUMENTATION CODES:   Obesity unspecified  INTERVENTION:   -Continue MVI with minerals daily -Continue Ensure Enlive po TID, each supplement provides 350 kcal and 20 grams of protein  NUTRITION DIAGNOSIS:   Increased nutrient needs related to post-op healing as evidenced by estimated needs.  Ongoing  GOAL:   Patient will meet greater than or equal to 90% of their needs  Progressing   MONITOR:   PO intake, Supplement acceptance, Labs, Weight trends, Skin, I & O's  REASON FOR ASSESSMENT:   Consult Assessment of nutrition requirement/status  ASSESSMENT:   Patient is a 39 year old female with past medical history of anemia, depression, hypothyroidism, PTSD who presented with one day history of abdominal pain and was found to have small bowel obstruction and a urinary tract infection.  12/26- NGT placed, connected to low intermittent suction 12/20- abdominal x-ray with persistent dilated small bowel 1/1- s/p Procedure performed:Exploratory laparotomy, small bowel resection 1/3- NGT d/c, advanced to clear liquid diet 1/4- advanced to soft diet 1/6- CT abdomen showing multiple organizing fluid collections concerning for developing abscesses  Reviewed I/O's: +3 L x 24 hours and +25.9 L since admission  UOP: 950 ml x 24 hours  Pt may require IR involvement for possible abscess drainage.   Pt remains on a soft diet, however, PO intake remains minimal. Documented PO 0-25%. Pt is consuming Ensure supplements per MAR.   Per RNCM notes, plan to d/c home with wet to dry dressings. Family member to come to hospital to be educated on dressing changes.   Medications reviewed and include magnesium oxide, zosyn, and lactated ringers infusion @ 75 ml/hr.   Labs reviewed.   Diet Order:   Diet Order            DIET SOFT Room service appropriate? Yes; Fluid consistency: Thin  Diet effective now              EDUCATION NEEDS:    No education needs have been identified at this time  Skin:  Skin Assessment: Skin Integrity Issues: Skin Integrity Issues:: Incisions Incisions: closed abdomen  Last BM:  07/13/19  Height:   Ht Readings from Last 1 Encounters:  07/02/19 5' (1.524 m)    Weight:   Wt Readings from Last 1 Encounters:  07/02/19 74.4 kg    Ideal Body Weight:  45.5 kg  BMI:  Body mass index is 32.03 kg/m.  Estimated Nutritional Needs:   Kcal:  2050-2250  Protein:  105-120 grams  Fluid:  > 2 L    Bolivar Koranda A. Jimmye Norman, RD, LDN, [place] Registered Dietitian II Certified Diabetes Care and Education Specialist Pager: [phone] After hours Pager: [phone]

## 2019-07-14 ENCOUNTER — Inpatient Hospital Stay (HOSPITAL_COMMUNITY): Payer: Medicaid Other

## 2019-07-14 LAB — COMPREHENSIVE METABOLIC PANEL
ALT: 19 U/L (ref 0–44)
AST: 17 U/L (ref 15–41)
Albumin: 1.7 g/dL — ABNORMAL LOW (ref 3.5–5.0)
Alkaline Phosphatase: 65 U/L (ref 38–126)
Anion gap: 7 (ref 5–15)
BUN: <5 mg/dL — ABNORMAL LOW (ref 6–20)
CO2: 21 mmol/L — ABNORMAL LOW (ref 22–32)
Calcium: 9.1 mg/dL (ref 8.9–10.3)
Chloride: 113 mmol/L — ABNORMAL HIGH (ref 98–111)
Creatinine, Ser: 0.68 mg/dL (ref 0.44–1.00)
GFR calc Af Amer: >60 mL/min (ref >60)
GFR calc non Af Amer: >60 mL/min (ref >60)
Glucose, Bld: 84 mg/dL (ref 70–99)
Potassium: 4.2 mmol/L (ref 3.5–5.1)
Sodium: 141 mmol/L (ref 135–145)
Total Bilirubin: 0.3 mg/dL (ref 0.3–1.2)
Total Protein: 4.9 g/dL — ABNORMAL LOW (ref 6.5–8.1)

## 2019-07-14 LAB — CBC WITH DIFFERENTIAL/PLATELET
Abs Immature Granulocytes: 0.27 10*3/uL — ABNORMAL HIGH (ref 0.00–0.07)
Basophils Absolute: 0 10*3/uL (ref 0.0–0.1)
Basophils Relative: 0 %
Eosinophils Absolute: 0.2 10*3/uL (ref 0.0–0.5)
Eosinophils Relative: 1 %
HCT: 26.4 % — ABNORMAL LOW (ref 36.0–46.0)
Hemoglobin: 8.6 g/dL — ABNORMAL LOW (ref 12.0–15.0)
Immature Granulocytes: 2 %
Lymphocytes Relative: 14 %
Lymphs Abs: 2.4 10*3/uL (ref 0.7–4.0)
MCH: 30.3 pg (ref 26.0–34.0)
MCHC: 32.6 g/dL (ref 30.0–36.0)
MCV: 93 fL (ref 80.0–100.0)
Monocytes Absolute: 1.5 10*3/uL — ABNORMAL HIGH (ref 0.1–1.0)
Monocytes Relative: 9 %
Neutro Abs: 12.6 10*3/uL — ABNORMAL HIGH (ref 1.7–7.7)
Neutrophils Relative %: 74 %
Platelets: 336 10*3/uL (ref 150–400)
RBC: 2.84 MIL/uL — ABNORMAL LOW (ref 3.87–5.11)
RDW: 14.7 % (ref 11.5–15.5)
WBC: 17.1 10*3/uL — ABNORMAL HIGH (ref 4.0–10.5)
nRBC: 0 % (ref 0.0–0.2)

## 2019-07-14 MED ORDER — LIDOCAINE HCL 1 % IJ SOLN
INTRAMUSCULAR | Status: AC
  Filled 2019-07-14: qty 20

## 2019-07-14 MED ORDER — AMOXICILLIN-POT CLAVULANATE 875-125 MG PO TABS
1.0000 | ORAL_TABLET | Freq: Two times a day (BID) | ORAL | Status: DC
  Administered 2019-07-14 – 2019-07-18 (×8): 1 via ORAL
  Filled 2019-07-14 (×8): qty 1

## 2019-07-14 MED ORDER — MIDAZOLAM HCL 2 MG/2ML IJ SOLN
INTRAMUSCULAR | Status: AC
  Filled 2019-07-14: qty 2

## 2019-07-14 MED ORDER — FENTANYL CITRATE (PF) 100 MCG/2ML IJ SOLN
INTRAMUSCULAR | Status: AC
  Filled 2019-07-14: qty 2

## 2019-07-14 NOTE — Progress Notes (Signed)
Patient IV began to leak so the IV had to be removed; IV team consulted; Patient stated she did not want another IV per IV team Nurse Zenia Resides. When I spoke with the patient she stated she did not understand why she needed another IV; I provided patient education on antibiotic therapy and fluids she is receiving. Patient stated "I've been on antibiotics consistently for the past 10 years. I'll rather continue taking it as a pill. I don't want another IV because y'all are leaving me with bruises; I've never had so many bruises in my life." MD made aware of patient's refusal of IV and cardiac monitoring. Will continue to monitor.

## 2019-07-14 NOTE — Sedation Documentation (Signed)
No meds given and no procedure started. Report called to bedside RN [phone]

## 2019-07-14 NOTE — Progress Notes (Signed)
Patient ID: Theresa Mccoy, female   DOB: [birth year], 39 y.o.   MRN: [id number] Patient was brought to CT for possible drain placement.  CT images of pelvis obtained with left side down.  Small amount of fluid in pelvis and likely layering ascites.  Discussed with Dr. Dema Severin.  Drain not placed.

## 2019-07-14 NOTE — Progress Notes (Signed)
Present to start PIV  Pt. Refusing states she does mot want any more needle sticks and feels like she does not need antibiotics.  Floor RN made aware.

## 2019-07-14 NOTE — Progress Notes (Signed)
See prior nurse note.  Advised pt is refusing another IV and requests oral abx instead.  Brief review of OP note and labs and vitals and ct results  No overt abscess on ct Scattered free fluid - no unexpected given OR findings and irrigation.  Will stop iv zosyn, switch to augmentin Will let day team readdress potential need for iv/oral abx with pt tomorrow  Theresa Mccoy. Redmond Pulling, MD, FACS General, Bariatric, & Minimally Invasive Surgery [facility], [place]

## 2019-07-14 NOTE — Progress Notes (Signed)
[facility] Progress Note  6 Days Post-Op  Subjective: CC-  Still having some abdominal pain. Denies n/v. Loose stool slowing down. Tolerating diet but not taking in much.  CT yesterday showed multiple fluid collections with the largest 26 x 44 x 63mm in the pelvis. IR planning perc drain today. Patient states that she does not want to have another procedure, but she is ok with proceeding.  Objective: Vital signs in last 24 hours: Temp:  [99.3 F (37.4 C)-99.6 F (37.6 C)] 99.3 F (37.4 C) (01/07 0520) Pulse Rate:  [70-71] 71 (01/07 0520) Resp:  [18-22] 18 (01/07 0520) BP: (146-157)/(84-91) 153/84 (01/07 0520) SpO2:  [100 %] 100 % (01/07 0520) Last BM Date: 07/13/19  Intake/Output from previous day: 01/06 0701 - 01/07 0700 In: 1974.6 [I.V.:1763.4; IV Piggyback:211.2] Out: 3950 [Urine:3950] Intake/Output this shift: Total I/O In: -  Out: 300 [Urine:300]  PE: Gen: Alert, NAD, pleasant HEENT: EOM's intact, pupils equal and round Pulm:Rate andeffort normal Abd: Soft,mild distension, +BS,open midline incision pink without erythema or drainage/ no cellulitits, mild diffuse tenderness without peritonitis Skin: no rashes noted, warm and dry   Lab Results:  Recent Labs    07/13/19 0233 07/14/19 0209  WBC 21.7* 17.1*  HGB 8.5* 8.6*  HCT 25.2* 26.4*  PLT 260 336   BMET Recent Labs    07/13/19 0233 07/14/19 0209  NA 139 141  K 4.1 4.2  CL 112* 113*  CO2 22 21*  GLUCOSE 92 84  BUN <5* <5*  CREATININE 0.64 0.68  CALCIUM 9.0 9.1   PT/INR Recent Labs    07/13/19 1844  LABPROT 14.8  INR 1.2   CMP     Component Value Date/Time   NA 141 07/14/2019 0209   NA 143 03/02/2017 1556   K 4.2 07/14/2019 0209   CL 113 (H) 07/14/2019 0209   CO2 21 (L) 07/14/2019 0209   GLUCOSE 84 07/14/2019 0209   BUN <5 (L) 07/14/2019 0209   BUN 9 03/02/2017 1556   CREATININE 0.68 07/14/2019 0209   CALCIUM 9.1 07/14/2019 0209   PROT 4.9 (L) 07/14/2019 0209    PROT 6.7 03/02/2017 1556   ALBUMIN 1.7 (L) 07/14/2019 0209   ALBUMIN 4.0 03/02/2017 1556   AST 17 07/14/2019 0209   ALT 19 07/14/2019 0209   ALKPHOS 65 07/14/2019 0209   BILITOT 0.3 07/14/2019 0209   BILITOT 0.2 03/02/2017 1556   GFRNONAA >60 07/14/2019 0209   GFRAA >60 07/14/2019 0209   Lipase     Component Value Date/Time   LIPASE 20 07/01/2019 2141       Studies/Results: CT ABDOMEN PELVIS W CONTRAST  Result Date: 07/13/2019 CLINICAL DATA:  39 year old female status post small bowel closed loop obstruction with necrotic perforation. Small bowel resection and exploratory laparotomy postoperative day 5. EXAM: CT ABDOMEN AND PELVIS WITH CONTRAST TECHNIQUE: Multidetector CT imaging of the abdomen and pelvis was performed using the standard protocol following bolus administration of intravenous contrast. CONTRAST:  185mL OMNIPAQUE IOHEXOL 300 MG/ML  SOLN COMPARISON:  Preoperative noncontrast CT Abdomen and Pelvis 07/02/2019 FINDINGS: Lower chest: New bilateral small to moderate layering pleural effusions with enhancing bilateral lower lobe atelectasis. No pericardial effusion. Middle lobes remain clear. Hepatobiliary: Negative liver and gallbladder aside from adjacent free fluid. Pancreas: Negative. Spleen: Negative aside from adjacent free fluid. Adrenals/Urinary Tract: There is a small medial limb left adrenal nodule which was low-density on the prior CT and most compatible with benign adrenal adenoma measuring about 14 millimeters.  Negative right adrenal. Bilateral renal enhancement and contrast excretion is symmetric and within normal limits. Bulky right lower pole nephrolithiasis measures 10 millimeters. Proximal ureters are normal. Diminutive and unremarkable urinary bladder. Stomach/Bowel: Decompressed rectosigmoid colon. The sigmoid is redundant tracking into the right lower quadrant. Similar decompressed descending colon, and all of those segments appear mildly thickened and indistinct.  Gas-filled more normal appearing transverse colon is mildly redundant. There is oral contrast in the right colon to the hepatic flexure. Probable appendix on coronal image 66 appears mildly indistinct but not abnormally dilated. The terminal ileum and distal most small bowel are within normal limits. There is a distal small bowel side to side anastomosis in the midline seen on series 3, image 68 and coronal image 49. No adverse features. Upstream small bowel is mildly dilated. The stomach is also moderately distended with gas and residual oral contrast. The duodenum is decompressed. Abdominal and pelvic free fluid and fluid collections are described below. Vascular/Lymphatic: Major arterial structures in the abdomen and pelvis are patent and appear normal. Portal venous system is patent. No lymphadenopathy. Reproductive: Negative. Other: Generalized subcutaneous body wall edema. Ventral abdominal incision healing by secondary intention. In the cul-de-sac there is a rounded fluid collection with mild rim enhancement and regional mass effect (series 3, image 78) encompassing 26 x 44 x 47 millimeters (AP by transverse by CC). However, the cephalad component of this collection is contiguous with more free fluid in the lower abdomen and pelvic inlet. Superimposed serpiginous rim enhancing fluid collection along the right abdomen inferior to the liver continuing to the right lower quadrant and also somewhat contiguous over the anterior pelvic inlet. The largest components of this collection measure 2 centimeters in thickness. See series 3, images 57 through 72). Superimposed non organized free-fluid elsewhere in the abdomen. No pneumoperitoneum identified. Musculoskeletal: No acute osseous abnormality identified. IMPRESSION: 1. Distal small bowel anastomosis with no adverse features. No bowel obstruction (oral contrast has reached the hepatic flexure), although the upstream small bowel is mildly dilated. 2. Widespread  free fluid in the abdomen and pelvis. Several organizing fluid collections suspicious for developing abscesses, including thin serpiginous collections along the right lateral abdomen and lower quadrant, and a larger more rounded organizing deep pelvic fluid collection exerting some mass effect in the cul-de-sac (approximately 25 mL, see series 3, image 78). 3. Anasarca. Moderate bilateral layering pleural effusions with lower lobe atelectasis. Electronically Signed   By: Genevie Ann M.D.   On: 07/13/2019 12:54    Anti-infectives: Anti-infectives (From admission, onward)   Start     Dose/Rate Route Frequency Ordered Stop   07/11/19 1115  fluconazole (DIFLUCAN) tablet 300 mg     300 mg Oral  Once 07/11/19 1104 07/11/19 1133   07/11/19 1000  itraconazole (SPORANOX) capsule 200 mg  Status:  Discontinued     200 mg Oral Daily 07/11/19 0957 07/11/19 1104   07/03/19 1400  piperacillin-tazobactam (ZOSYN) IVPB 3.375 g     3.375 g 12.5 mL/hr over 240 Minutes Intravenous Every 8 hours 07/03/19 1005     07/03/19 1015  piperacillin-tazobactam (ZOSYN) IVPB 3.375 g  Status:  Discontinued     3.375 g 12.5 mL/hr over 240 Minutes Intravenous Every 8 hours 07/03/19 1005 07/03/19 1005   07/03/19 0900  cefTRIAXone (ROCEPHIN) 1 g in sodium chloride 0.9 % 100 mL IVPB     1 g 200 mL/hr over 30 Minutes Intravenous  Once 07/02/19 1351 07/03/19 0936   07/02/19 0830  cefTRIAXone (ROCEPHIN)  1 g in sodium chloride 0.9 % 100 mL IVPB     1 g 200 mL/hr over 30 Minutes Intravenous  Once 07/02/19 [id number] 07/02/19 1117       Assessment/Plan Depression Hypercalcemia/elevated PTH- 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 Kae Heller  - POD #6 - BID wet to dry dressing changes to  midline abdominal wound - CT 1/6 showed multiple small fluid collections and a larger one in the deep pelvis >> IR to day today  ID -zosyn 12/27>>day#12 FEN -IVF, NPO for procedure VTE -SCDs, lovenox Foley -none Follow up -Dr. Alycia Patten for perc drainage of abscess today. Continue zosyn for now. Ok to resume soft diet after procedure. Encourage PO intake. Labs in AM.   LOS: 12 days    [place], [facility] 07/14/2019, 8:17 AM Please see Amion for pager number during day hours 7:00am-4:30pm

## 2019-07-14 NOTE — Sedation Documentation (Signed)
Procedure canceled per Anselm Pancoast MD. No fluid or abscess present during CT

## 2019-07-15 LAB — PTH-RELATED PEPTIDE: PTH-related peptide: <2 pmol/L

## 2019-07-15 NOTE — Progress Notes (Signed)
[facility] Progress Note  7 Days Post-Op  Subjective: CC-  Feeling ok this morning. Abdominal pain about the same. Tolerating diet but not eating much. BM yesterday. Ambulating without issues.  IV came out last night and patient refused to have this replaced. Continues to refuse this morning. States that she does not want any more bruises. Also refused AM labs.  Objective: Vital signs in last 24 hours: Temp:  [98 F (36.7 C)-98.8 F (37.1 C)] 98.8 F (37.1 C) (01/08 0436) Pulse Rate:  [88-100] 100 (01/08 0436) Resp:  [18-24] 18 (01/08 0436) BP: (135-150)/(87-94) 135/93 (01/08 0436) SpO2:  [94 %-100 %] 99 % (01/08 0436) Last BM Date: 07/14/19  Intake/Output from previous day: 01/07 0701 - 01/08 0700 In: 0  Out: 1450 [Urine:1450] Intake/Output this shift: No intake/output data recorded.  PE: Gen: Alert, NAD, pleasant HEENT: EOM's intact, pupils equal and round Pulm:Rate andeffort normal Abd: Soft,mild distension, +BS,open midline incision pink without erythema or drainage/ no cellulitits, nontender Skin: no rashes noted, warm and dry    Lab Results:  Recent Labs    07/13/19 0233 07/14/19 0209  WBC 21.7* 17.1*  HGB 8.5* 8.6*  HCT 25.2* 26.4*  PLT 260 336   BMET Recent Labs    07/13/19 0233 07/14/19 0209  NA 139 141  K 4.1 4.2  CL 112* 113*  CO2 22 21*  GLUCOSE 92 84  BUN <5* <5*  CREATININE 0.64 0.68  CALCIUM 9.0 9.1   PT/INR Recent Labs    07/13/19 1844  LABPROT 14.8  INR 1.2   CMP     Component Value Date/Time   NA 141 07/14/2019 0209   NA 143 03/02/2017 1556   K 4.2 07/14/2019 0209   CL 113 (H) 07/14/2019 0209   CO2 21 (L) 07/14/2019 0209   GLUCOSE 84 07/14/2019 0209   BUN <5 (L) 07/14/2019 0209   BUN 9 03/02/2017 1556   CREATININE 0.68 07/14/2019 0209   CALCIUM 9.1 07/14/2019 0209   PROT 4.9 (L) 07/14/2019 0209   PROT 6.7 03/02/2017 1556   ALBUMIN 1.7 (L) 07/14/2019 0209   ALBUMIN 4.0 03/02/2017 1556   AST 17  07/14/2019 0209   ALT 19 07/14/2019 0209   ALKPHOS 65 07/14/2019 0209   BILITOT 0.3 07/14/2019 0209   BILITOT 0.2 03/02/2017 1556   GFRNONAA >60 07/14/2019 0209   GFRAA >60 07/14/2019 0209   Lipase     Component Value Date/Time   LIPASE 20 07/01/2019 2141       Studies/Results: CT PELVIS WO CONTRAST  Result Date: 07/14/2019 CLINICAL DATA:  39 year old with history closed loop bowel obstruction and necrotic bowel. Patient has postoperative fluid and plan for CT-guided aspiration or drainage. EXAM: CT PELVIS WITHOUT CONTRAST TECHNIQUE: Multidetector CT imaging of the pelvis was performed following the standard protocol without intravenous contrast. COMPARISON:  CT of the abdomen pelvis 07/13/2019 FINDINGS: CT images were obtained with the patient on her left side in anticipation of a transgluteal drain placement. Oral contrast has migrated into the colon and rectum. Mild to moderate distention of the rectum with gas and oral contrast. The pelvic fluid collection is smaller and there is a small amount of fluid along the anterior lower abdomen and layering in the left lower abdomen. IMPRESSION: Pelvic fluid collection has decreased in size and suspect this fluid is layering ascites. Again noted is a small amount of fluid in the anterior lower abdomen and left lower quadrant. Findings discussed with Dr. Dema Severin in General  Surgery. CT-guided aspiration/drainage was not performed. Electronically Signed   By: Markus Daft M.D.   On: 07/14/2019 15:34   CT ABDOMEN PELVIS W CONTRAST  Result Date: 07/13/2019 CLINICAL DATA:  39 year old female status post small bowel closed loop obstruction with necrotic perforation. Small bowel resection and exploratory laparotomy postoperative day 5. EXAM: CT ABDOMEN AND PELVIS WITH CONTRAST TECHNIQUE: Multidetector CT imaging of the abdomen and pelvis was performed using the standard protocol following bolus administration of intravenous contrast. CONTRAST:  145mL OMNIPAQUE  IOHEXOL 300 MG/ML  SOLN COMPARISON:  Preoperative noncontrast CT Abdomen and Pelvis 07/02/2019 FINDINGS: Lower chest: New bilateral small to moderate layering pleural effusions with enhancing bilateral lower lobe atelectasis. No pericardial effusion. Middle lobes remain clear. Hepatobiliary: Negative liver and gallbladder aside from adjacent free fluid. Pancreas: Negative. Spleen: Negative aside from adjacent free fluid. Adrenals/Urinary Tract: There is a small medial limb left adrenal nodule which was low-density on the prior CT and most compatible with benign adrenal adenoma measuring about 14 millimeters. Negative right adrenal. Bilateral renal enhancement and contrast excretion is symmetric and within normal limits. Bulky right lower pole nephrolithiasis measures 10 millimeters. Proximal ureters are normal. Diminutive and unremarkable urinary bladder. Stomach/Bowel: Decompressed rectosigmoid colon. The sigmoid is redundant tracking into the right lower quadrant. Similar decompressed descending colon, and all of those segments appear mildly thickened and indistinct. Gas-filled more normal appearing transverse colon is mildly redundant. There is oral contrast in the right colon to the hepatic flexure. Probable appendix on coronal image 66 appears mildly indistinct but not abnormally dilated. The terminal ileum and distal most small bowel are within normal limits. There is a distal small bowel side to side anastomosis in the midline seen on series 3, image 68 and coronal image 49. No adverse features. Upstream small bowel is mildly dilated. The stomach is also moderately distended with gas and residual oral contrast. The duodenum is decompressed. Abdominal and pelvic free fluid and fluid collections are described below. Vascular/Lymphatic: Major arterial structures in the abdomen and pelvis are patent and appear normal. Portal venous system is patent. No lymphadenopathy. Reproductive: Negative. Other: Generalized  subcutaneous body wall edema. Ventral abdominal incision healing by secondary intention. In the cul-de-sac there is a rounded fluid collection with mild rim enhancement and regional mass effect (series 3, image 78) encompassing 26 x 44 x 47 millimeters (AP by transverse by CC). However, the cephalad component of this collection is contiguous with more free fluid in the lower abdomen and pelvic inlet. Superimposed serpiginous rim enhancing fluid collection along the right abdomen inferior to the liver continuing to the right lower quadrant and also somewhat contiguous over the anterior pelvic inlet. The largest components of this collection measure 2 centimeters in thickness. See series 3, images 57 through 72). Superimposed non organized free-fluid elsewhere in the abdomen. No pneumoperitoneum identified. Musculoskeletal: No acute osseous abnormality identified. IMPRESSION: 1. Distal small bowel anastomosis with no adverse features. No bowel obstruction (oral contrast has reached the hepatic flexure), although the upstream small bowel is mildly dilated. 2. Widespread free fluid in the abdomen and pelvis. Several organizing fluid collections suspicious for developing abscesses, including thin serpiginous collections along the right lateral abdomen and lower quadrant, and a larger more rounded organizing deep pelvic fluid collection exerting some mass effect in the cul-de-sac (approximately 25 mL, see series 3, image 78). 3. Anasarca. Moderate bilateral layering pleural effusions with lower lobe atelectasis. Electronically Signed   By: Genevie Ann M.D.   On: 07/13/2019 12:54  Anti-infectives: Anti-infectives (From admission, onward)   Start     Dose/Rate Route Frequency Ordered Stop   07/14/19 2200  amoxicillin-clavulanate (AUGMENTIN) 875-125 MG per tablet 1 tablet     1 tablet Oral Every 12 hours 07/14/19 1819     07/11/19 1115  fluconazole (DIFLUCAN) tablet 300 mg     300 mg Oral  Once 07/11/19 1104 07/11/19  1133   07/11/19 1000  itraconazole (SPORANOX) capsule 200 mg  Status:  Discontinued     200 mg Oral Daily 07/11/19 0957 07/11/19 1104   07/03/19 1400  piperacillin-tazobactam (ZOSYN) IVPB 3.375 g  Status:  Discontinued     3.375 g 12.5 mL/hr over 240 Minutes Intravenous Every 8 hours 07/03/19 1005 07/14/19 1819   07/03/19 1015  piperacillin-tazobactam (ZOSYN) IVPB 3.375 g  Status:  Discontinued     3.375 g 12.5 mL/hr over 240 Minutes Intravenous Every 8 hours 07/03/19 1005 07/03/19 1005   07/03/19 0900  cefTRIAXone (ROCEPHIN) 1 g in sodium chloride 0.9 % 100 mL IVPB     1 g 200 mL/hr over 30 Minutes Intravenous  Once 07/02/19 1351 07/03/19 0936   07/02/19 0830  cefTRIAXone (ROCEPHIN) 1 g in sodium chloride 0.9 % 100 mL IVPB     1 g 200 mL/hr over 30 Minutes Intravenous  Once 07/02/19 [id number] 07/02/19 1117       Assessment/Plan Depression Hypercalcemia/elevated PTH- 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 Kae Heller  - POD #7 - BID wet to dry dressing changes to midline abdominal wound - CT 1/6 showed multiple small fluid collections and a larger one in the deep pelvis, IR unable to drain as small amount of fluid in pelvis and likely layering ascites   ID -zosyn 12/27>>1/7, augmentin 1/8>> FEN -soft diet, Ensure VTE -SCDs, lovenox Foley -none Follow up -Dr. Kae Heller  Plan-Discussed importance of replacing IV and restarting IV antibiotics. Patient continues to refuse. Continue augmentin for now.  Encourage PO intake. Continue mobilizing. Plan to repeat scan 1/10 to see if fluid seen on CT has evolved into a drainable abscess.    LOS: 13 days    Wellington Hampshire, [facility] 07/15/2019, 8:55 AM Please see  Amion for pager number during day hours 7:00am-4:30pm

## 2019-07-15 NOTE — Progress Notes (Signed)
Physical Therapy Treatment Patient Details Name: Theresa Mccoy MRN: [id number] DOB: 1980-12-30 Today's Date: 07/15/2019    History of Present Illness Pt is a 39 y/o female with past medical history of anemia, depression, hypothyroidism, PTSD who presented with 1 day history of abdominal pain and was found to have small bowel obstruction and a urinary tract infection.    PT Comments    Pt making steady progress with functional mobility. She was able to perform bed mobility and transfers with less physical assistance than previous sessions. Continue to feel pt would benefit from f/u HHPT services upon d/c home with family support. Pt would continue to benefit from skilled physical therapy services at this time while admitted and after d/c to address the below listed limitations in order to improve overall safety and independence with functional mobility.    Follow Up Recommendations  Home health PT     Equipment Recommendations  Rolling walker with 5" wheels;3in1 (PT)    Recommendations for Other Services       Precautions / Restrictions Precautions Precautions: Fall Precaution Comments: open abdominal wound Restrictions Weight Bearing Restrictions: No    Mobility  Bed Mobility Overal bed mobility: Needs Assistance Bed Mobility: Supine to Sit;Sit to Supine     Supine to sit: Supervision Sit to supine: Supervision   General bed mobility comments: supervision for safety  Transfers Overall transfer level: Needs assistance Equipment used: Rolling walker (2 wheeled) Transfers: Sit to/from [facility] Sit to Stand: Min guard Stand pivot transfers: Min guard       General transfer comment: min guard for safety, no physical assistance needed. pt transferred bed<>BSC  Ambulation/Gait             General Gait Details: pt deferring   Stairs             Wheelchair Mobility    Modified Rankin (Stroke Patients Only)       Balance Overall  balance assessment: History of Falls;Needs assistance Sitting-balance support: Feet supported Sitting balance-Leahy Scale: Good     Standing balance support: During functional activity Standing balance-Leahy Scale: Fair                              Cognition Arousal/Alertness: Awake/alert Behavior During Therapy: WFL for tasks assessed/performed Overall Cognitive Status: Within Functional Limits for tasks assessed                                        Exercises      General Comments        Pertinent Vitals/Pain Pain Assessment: No/denies pain    Home Living                      Prior Function            PT Goals (current goals can now be found in the care plan section) Acute Rehab PT Goals PT Goal Formulation: With patient Time For Goal Achievement: 07/26/19 Potential to Achieve Goals: Good Progress towards PT goals: Progressing toward goals    Frequency    Min 3X/week      PT Plan Current plan remains appropriate    Co-evaluation              AM-PAC PT "6 Clicks" Mobility   Outcome Measure  Help needed turning from  your back to your side while in a flat bed without using bedrails?: None Help needed moving from lying on your back to sitting on the side of a flat bed without using bedrails?: None Help needed moving to and from a bed to a chair (including a wheelchair)?: A Little Help needed standing up from a chair using your arms (e.g., wheelchair or bedside chair)?: A Little Help needed to walk in hospital room?: A Little Help needed climbing 3-5 steps with a railing? : A Lot 6 Click Score: 19    End of Session   Activity Tolerance: Patient tolerated treatment well Patient left: in bed;with call bell/phone within reach Nurse Communication: Mobility status PT Visit Diagnosis: Other abnormalities of gait and mobility (R26.89);History of falling (Z91.81);Muscle weakness (generalized) (M62.81)     Time:  [id number] PT Time Calculation (min) (ACUTE ONLY): 17 min  Charges:  $Therapeutic Activity: 8-22 mins                     Anastasio Champion, DPT  Acute Rehabilitation Services Pager [phone] Office [place] 07/15/2019, 4:55 PM

## 2019-07-15 NOTE — Social Work (Signed)
CSW spoke with pt via telephone. Introduced self, role, reason for call. Pt from home with family, she still has continued medical work up. CSW let pt know that [facility] had consulted this writer to provide with supportive mental health resources for when pt discharges. Pt agreeable to packet being placed in discharge folder for her to take home with her when medically stable. CSW let pt know we are here for support as needed.   CSW signing off. Please consult if any additional needs arise.  Alexander Mt, [place] Work

## 2019-07-16 NOTE — Progress Notes (Signed)
Refused drsg changed at this time.Said to wait for Boyfriend to come at 10am so he watch how to do it.

## 2019-07-16 NOTE — Progress Notes (Signed)
Patient's husband at bedside. He observed wet to dry dressing . Verbalized understanding how to changed dressing.

## 2019-07-16 NOTE — Progress Notes (Signed)
Patient ID: Theresa Mccoy, female   DOB: August 11, 1980, 39 y.o.   MRN: [id number]  [facility] Progress Note:   8 Days Post-Op  Subjective: Mental status is talkative;   Objective: Vital signs in last 24 hours: Temp:  [99 F (37.2 C)-100 F (37.8 C)] 99 F (37.2 C) (01/09 0435) Pulse Rate:  [87-91] 88 (01/09 0435) Resp:  [15-18] 18 (01/09 0435) BP: (135-143)/(89-93) 136/92 (01/09 0435) SpO2:  [96 %-98 %] 96 % (01/09 0435)  Intake/Output from previous day: 01/08 0701 - 01/09 0700 In: 350 [P.O.:350] Out: 550 [Urine:550] Intake/Output this shift: No intake/output data recorded.  Physical Exam: Work of breathing is not labored.  Wound dressed and waiting for husband to come and observe how to change  Lab Results:  No results found for this or any previous visit (from the past 24 hour(s)).  Radiology/Results: CT PELVIS WO CONTRAST  Result Date: 07/14/2019 CLINICAL DATA:  39 year old with history closed loop bowel obstruction and necrotic bowel. Patient has postoperative fluid and plan for CT-guided aspiration or drainage. EXAM: CT PELVIS WITHOUT CONTRAST TECHNIQUE: Multidetector CT imaging of the pelvis was performed following the standard protocol without intravenous contrast. COMPARISON:  CT of the abdomen pelvis 07/13/2019 FINDINGS: CT images were obtained with the patient on her left side in anticipation of a transgluteal drain placement. Oral contrast has migrated into the colon and rectum. Mild to moderate distention of the rectum with gas and oral contrast. The pelvic fluid collection is smaller and there is a small amount of fluid along the anterior lower abdomen and layering in the left lower abdomen. IMPRESSION: Pelvic fluid collection has decreased in size and suspect this fluid is layering ascites. Again noted is a small amount of fluid in the anterior lower abdomen and left lower quadrant. Findings discussed with Dr. Dema Severin in General Surgery. CT-guided aspiration/drainage  was not performed. Electronically Signed   By: Markus Daft M.D.   On: 07/14/2019 15:34    Anti-infectives: Anti-infectives (From admission, onward)   Start     Dose/Rate Route Frequency Ordered Stop   07/14/19 2200  amoxicillin-clavulanate (AUGMENTIN) 875-125 MG per tablet 1 tablet     1 tablet Oral Every 12 hours 07/14/19 1819     07/11/19 1115  fluconazole (DIFLUCAN) tablet 300 mg     300 mg Oral  Once 07/11/19 1104 07/11/19 1133   07/11/19 1000  itraconazole (SPORANOX) capsule 200 mg  Status:  Discontinued     200 mg Oral Daily 07/11/19 0957 07/11/19 1104   07/03/19 1400  piperacillin-tazobactam (ZOSYN) IVPB 3.375 g  Status:  Discontinued     3.375 g 12.5 mL/hr over 240 Minutes Intravenous Every 8 hours 07/03/19 1005 07/14/19 1819   07/03/19 1015  piperacillin-tazobactam (ZOSYN) IVPB 3.375 g  Status:  Discontinued     3.375 g 12.5 mL/hr over 240 Minutes Intravenous Every 8 hours 07/03/19 1005 07/03/19 1005   07/03/19 0900  cefTRIAXone (ROCEPHIN) 1 g in sodium chloride 0.9 % 100 mL IVPB     1 g 200 mL/hr over 30 Minutes Intravenous  Once 07/02/19 1351 07/03/19 0936   07/02/19 0830  cefTRIAXone (ROCEPHIN) 1 g in sodium chloride 0.9 % 100 mL IVPB     1 g 200 mL/hr over 30 Minutes Intravenous  Once 07/02/19 [id number] 07/02/19 1117      Assessment/Plan: Problem List: Patient Active Problem List   Diagnosis Date Noted  . Hypercalcemia 07/07/2019  . Primary hyperparathyroidism ([place]) 07/07/2019  . Urinary tract infection  07/02/2019  . SBO (small bowel obstruction) ([place]) 07/02/2019  . Supervision of high risk pregnancy, antepartum 05/17/2019  . MDD (major depressive disorder), recurrent, severe, with psychosis ([place]) 10/06/2018  . Muscle cramps 02/02/2017  . Bipolar disorder, current episode mixed, moderate ([place]) 02/02/2017  . Generalized abdominal pain 02/02/2017  . Generalized anxiety disorder 02/02/2017  . Essential hypertension 02/02/2017    Observing the collections discussed  above on the CT scan.   8 Days Post-Op    LOS: 14 days   Matt B. Hassell Done, MD, [facility] [phone] beeper [phone]  07/16/2019 9:07 AM

## 2019-07-17 ENCOUNTER — Inpatient Hospital Stay (HOSPITAL_COMMUNITY): Payer: Medicaid Other

## 2019-07-17 ENCOUNTER — Encounter (HOSPITAL_COMMUNITY): Payer: Self-pay | Admitting: Internal Medicine

## 2019-07-17 MED ORDER — IOHEXOL 300 MG/ML  SOLN
100.0000 mL | Freq: Once | INTRAMUSCULAR | Status: AC | PRN
  Administered 2019-07-17: 11:00:00 100 mL via INTRAVENOUS

## 2019-07-17 NOTE — Plan of Care (Signed)
  Problem: Nutrition: Goal: Adequate nutrition will be maintained Outcome: Progressing   Problem: Coping: Goal: Level of anxiety will decrease Outcome: Progressing   Problem: Pain Managment: Goal: General experience of comfort will improve Outcome: Progressing   

## 2019-07-17 NOTE — Progress Notes (Signed)
Patient ID: Theresa Mccoy, female   DOB: 1981/02/20, 39 y.o.   MRN: [id number] [facility] Progress Note:   9 Days Post-Op  Subjective: Mental status is alert;  She has drunk contrast for CT but now refusing IV.  She has consented to one stick and the IV team has been consulted. Objective: Vital signs in last 24 hours: Temp:  [98.3 F (36.8 C)-99.3 F (37.4 C)] 99.3 F (37.4 C) (01/10 0433) Pulse Rate:  [79-95] 85 (01/10 0433) Resp:  [16-18] 17 (01/10 0433) BP: (137-148)/(86-90) 148/88 (01/10 0433) SpO2:  [98 %-100 %] 99 % (01/10 0433)  Intake/Output from previous day: 01/09 0701 - 01/10 0700 In: 120 [P.O.:120] Out: -  Intake/Output this shift: No intake/output data recorded.  Physical Exam: Work of breathing is normal.  Husband watched dressing change yesterday  Lab Results:  No results found for this or any previous visit (from the past 48 hour(s)).  Radiology/Results: No results found.  Anti-infectives: Anti-infectives (From admission, onward)   Start     Dose/Rate Route Frequency Ordered Stop   07/14/19 2200  amoxicillin-clavulanate (AUGMENTIN) 875-125 MG per tablet 1 tablet     1 tablet Oral Every 12 hours 07/14/19 1819     07/11/19 1115  fluconazole (DIFLUCAN) tablet 300 mg     300 mg Oral  Once 07/11/19 1104 07/11/19 1133   07/11/19 1000  itraconazole (SPORANOX) capsule 200 mg  Status:  Discontinued     200 mg Oral Daily 07/11/19 0957 07/11/19 1104   07/03/19 1400  piperacillin-tazobactam (ZOSYN) IVPB 3.375 g  Status:  Discontinued     3.375 g 12.5 mL/hr over 240 Minutes Intravenous Every 8 hours 07/03/19 1005 07/14/19 1819   07/03/19 1015  piperacillin-tazobactam (ZOSYN) IVPB 3.375 g  Status:  Discontinued     3.375 g 12.5 mL/hr over 240 Minutes Intravenous Every 8 hours 07/03/19 1005 07/03/19 1005   07/03/19 0900  cefTRIAXone (ROCEPHIN) 1 g in sodium chloride 0.9 % 100 mL IVPB     1 g 200 mL/hr over 30 Minutes Intravenous  Once 07/02/19 1351 07/03/19  0936   07/02/19 0830  cefTRIAXone (ROCEPHIN) 1 g in sodium chloride 0.9 % 100 mL IVPB     1 g 200 mL/hr over 30 Minutes Intravenous  Once 07/02/19 [id number] 07/02/19 1117      Assessment/Plan: Problem List: Patient Active Problem List   Diagnosis Date Noted  . Hypercalcemia 07/07/2019  . Primary hyperparathyroidism ([place]) 07/07/2019  . Urinary tract infection 07/02/2019  . SBO (small bowel obstruction) ([place]) 07/02/2019  . Supervision of high risk pregnancy, antepartum 05/17/2019  . MDD (major depressive disorder), recurrent, severe, with psychosis ([place]) 10/06/2018  . Muscle cramps 02/02/2017  . Bipolar disorder, current episode mixed, moderate ([place]) 02/02/2017  . Generalized abdominal pain 02/02/2017  . Generalized anxiety disorder 02/02/2017  . Essential hypertension 02/02/2017    Await repeat CT scan to assess for intraabdominal abscess 9 Days Post-Op    LOS: 15 days   Matt B. Hassell Done, MD, [facility] [phone] beeper [phone]  07/17/2019 8:08 AM

## 2019-07-18 DIAGNOSIS — F3162 Bipolar disorder, current episode mixed, moderate: Secondary | ICD-10-CM

## 2019-07-18 DIAGNOSIS — F419 Anxiety disorder, unspecified: Secondary | ICD-10-CM

## 2019-07-18 LAB — CBC
HCT: 26.5 % — ABNORMAL LOW (ref 36.0–46.0)
Hemoglobin: 8.6 g/dL — ABNORMAL LOW (ref 12.0–15.0)
MCH: 30 pg (ref 26.0–34.0)
MCHC: 32.5 g/dL (ref 30.0–36.0)
MCV: 92.3 fL (ref 80.0–100.0)
Platelets: 654 10*3/uL — ABNORMAL HIGH (ref 150–400)
RBC: 2.87 MIL/uL — ABNORMAL LOW (ref 3.87–5.11)
RDW: 14.3 % (ref 11.5–15.5)
WBC: 13.2 10*3/uL — ABNORMAL HIGH (ref 4.0–10.5)
nRBC: 0 % (ref 0.0–0.2)

## 2019-07-18 MED ORDER — OXYCODONE HCL 5 MG PO TABS: 5.0000 mg | ORAL_TABLET | Freq: Four times a day (QID) | ORAL | 0 refills | Status: DC | PRN

## 2019-07-18 MED ORDER — ADULT MULTIVITAMIN W/MINERALS CH: 1.0000 | ORAL_TABLET | Freq: Every day | ORAL | Status: DC

## 2019-07-18 MED ORDER — ACETAMINOPHEN 325 MG PO TABS: 650.0000 mg | ORAL_TABLET | Freq: Four times a day (QID) | ORAL | Status: DC | PRN

## 2019-07-18 MED ORDER — GABAPENTIN 600 MG PO TABS
300.0000 mg | ORAL_TABLET | Freq: Three times a day (TID) | ORAL | Status: DC
  Filled 2019-07-18: qty 1

## 2019-07-18 MED ORDER — BENZTROPINE MESYLATE 0.5 MG PO TABS
0.5000 mg | ORAL_TABLET | Freq: Every day | ORAL | Status: DC
  Administered 2019-07-18: 14:00:00 0.5 mg via ORAL
  Filled 2019-07-18: qty 1

## 2019-07-18 MED ORDER — RISPERIDONE 2 MG PO TABS
2.0000 mg | ORAL_TABLET | Freq: Two times a day (BID) | ORAL | Status: DC
  Administered 2019-07-18: 14:00:00 2 mg via ORAL
  Filled 2019-07-18 (×2): qty 1

## 2019-07-18 MED ORDER — AMOXICILLIN-POT CLAVULANATE 875-125 MG PO TABS: 1.0000 | ORAL_TABLET | Freq: Two times a day (BID) | ORAL | 0 refills | Status: AC

## 2019-07-18 NOTE — Discharge Instructions (Signed)
Wet to Dry WOUND CARE: - Change dressing twice daily - Supplies: sterile saline, kerlex, scissors, ABD pads, tape  1. Remove dressing and all packing carefully, moistening with sterile saline as needed to avoid packing/internal dressing sticking to the wound. 2.   Clean edges of skin around the wound with water/gauze, making sure there is no tape debris or leakage left on skin that could cause skin irritation or breakdown. 3.   Dampen and clean kerlex with sterile saline and pack wound from wound base to skin level, making sure to take note of any possible areas of wound tracking, tunneling and packing appropriately. Wound can be packed loosely. Trim kerlex to size if a whole kerlex is not required. 4.   Cover wound with a dry ABD pad and secure with tape.  5.   Write the date/time on the dry dressing/tape to better track when the last dressing change occurred. - apply any skin protectant/powder if recommended by clinician to protect skin/skin folds. - change dressing as needed if leakage occurs, wound gets contaminated, or patient requests to shower. - You may shower daily with wound open and following the shower the wound should be dried and a clean dressing placed.  - Medical grade tape as well as packing supplies can be found at [facility] on [address]. The remaining supplies can be found at your local drug store, [employer] etc.   [facility], [place] [phone]  OPEN ABDOMINAL SURGERY: POST OP INSTRUCTIONS  Always review your discharge instruction sheet given to you by the facility where your surgery was performed.  IF YOU HAVE DISABILITY OR FAMILY LEAVE FORMS, YOU MUST BRING THEM TO THE OFFICE FOR PROCESSING.  PLEASE DO NOT GIVE THEM TO YOUR DOCTOR.  1. A prescription for pain medication may be given to you upon discharge.  Take your pain medication as prescribed, if needed.  If narcotic pain medicine is not needed, then you may take acetaminophen (Tylenol) or  ibuprofen (Advil) as needed. 2. Take your usually prescribed medications unless otherwise directed. 3. If you need a refill on your pain medication, please contact your pharmacy. They will contact our office to request authorization.  Prescriptions will not be filled after 5pm or on week-ends. 4. You should follow a light diet the first few days after arrival home, such as soup and crackers, pudding, etc.unless your doctor has advised otherwise. A high-fiber, low fat diet can be resumed as tolerated.   Be sure to include lots of fluids daily. Most patients will experience some swelling and bruising on the chest and neck area.  Ice packs will help.  Swelling and bruising can take several days to resolve 5. Most patients will experience some swelling and bruising in the area of the incision. Ice pack will help. Swelling and bruising can take several days to resolve..  6. It is common to experience some constipation if taking pain medication after surgery.  Increasing fluid intake and taking a stool softener will usually help or prevent this problem from occurring.  A mild laxative (Milk of Magnesia or Miralax) should be taken according to package directions if there are no bowel movements after 48 hours. 7.  ACTIVITIES:  You may resume regular (light) daily activities beginning the next day--such as daily self-care, walking, climbing stairs--gradually increasing activities as tolerated.  You may have sexual intercourse when it is comfortable.  Refrain from any heavy lifting or straining until approved by your doctor. a.  You may drive when you no longer are taking prescription pain medication, you can comfortably wear a seatbelt, and you can safely maneuver your car and apply brakes 8. You should see your doctor in the office for a follow-up appointment approximately two weeks after your surgery.  Make sure that you call for this appointment within a day or two after you arrive home to insure a convenient  appointment time. OTHER INSTRUCTIONS:  _____________________________________________________________ _____________________________________________________________  WHEN TO CALL YOUR DOCTOR: 1. Fever over 101.0 2. Inability to urinate 3. Nausea and/or vomiting 4. Extreme swelling or bruising 5. Continued bleeding from incision. 6. Increased pain, redness, or drainage from the incision. 7. Difficulty swallowing or breathing 8. Muscle cramping or spasms. 9. Numbness or tingling in hands or feet or around lips.  The clinic staff is available to answer your questions during regular business hours.  Please don't hesitate to call and ask to speak to one of the nurses if you have concerns.  For further questions, please visit [email or web address]

## 2019-07-18 NOTE — Progress Notes (Signed)
Physical Therapy Treatment Patient Details Name: Theresa Mccoy MRN: [id number] DOB: 1980-11-22 Today's Date: 07/18/2019    History of Present Illness Pt is a 39 y/o female with past medical history of anemia, depression, hypothyroidism, PTSD who presented with 1 day history of abdominal pain and was found to have small bowel obstruction and a urinary tract infection.    PT Comments    Continuing work on functional mobility and activity tolerance;  [name] was agreeable to ambulate in hallway, and so session focused on progressive amb; significant improvements in mobility and balance; she did not need an assistive device for steady amb today as well; majority of existing acute PT goals met and updated.  Follow Up Recommendations  No PT follow up;Other (comment)(But I do anticipate the need for [facility])     Equipment Recommendations  3in1 (PT)    Recommendations for Other Services       Precautions / Restrictions Precautions Precaution Comments: open abdominal wound Restrictions Weight Bearing Restrictions: No    Mobility  Bed Mobility Overal bed mobility: Modified Independent                Transfers Overall transfer level: Modified independent               General transfer comment: slow moving, but stands without difficulty  Ambulation/Gait Ambulation/Gait assistance: Min guard;Supervision Gait Distance (Feet): 550 Feet Assistive device: None Gait Pattern/deviations: Step-through pattern;Decreased step length - right;Decreased step length - left Gait velocity: decreased   General Gait Details: Slow cadence and short steps, but walked the unit without difficulty and no overt loss of balance   Stairs             Wheelchair Mobility    Modified Rankin (Stroke Patients Only)       Balance     Sitting balance-Leahy Scale: Good     Standing balance support: During functional activity Standing balance-Leahy Scale: Good                               Cognition Arousal/Alertness: Awake/alert Behavior During Therapy: WFL for tasks assessed/performed Overall Cognitive Status: Within Functional Limits for tasks assessed(For simple mobility and amb) Area of Impairment: Safety/judgement                           Awareness: Intellectual   General Comments: Noted from Psych eval from today that she lacks capacity to make medical decisions      Exercises      General Comments        Pertinent Vitals/Pain Pain Assessment: No/denies pain    Home Living                      Prior Function            PT Goals (current goals can now be found in the care plan section) Acute Rehab PT Goals Patient Stated Goal: did not state, but agreeable to amb PT Goal Formulation: With patient Time For Goal Achievement: 07/26/19 Potential to Achieve Goals: Good Progress towards PT goals: Progressing toward goals    Frequency    Min 3X/week      PT Plan Discharge plan needs to be updated    Co-evaluation              AM-PAC PT "6 Clicks" Mobility   Outcome Measure  Help  needed turning from your back to your side while in a flat bed without using bedrails?: None Help needed moving from lying on your back to sitting on the side of a flat bed without using bedrails?: None Help needed moving to and from a bed to a chair (including a wheelchair)?: None Help needed standing up from a chair using your arms (e.g., wheelchair or bedside chair)?: None Help needed to walk in hospital room?: None Help needed climbing 3-5 steps with a railing? : A Little 6 Click Score: 23    End of Session Equipment Utilized During Treatment: Gait belt Activity Tolerance: Patient tolerated treatment well Patient left: Other (comment)(on BSC beside bed) Nurse Communication: Mobility status PT Visit Diagnosis: Other abnormalities of gait and mobility (R26.89);History of falling (Z91.81);Muscle weakness (generalized)  (M62.81) Pain - part of body: (abdomen)     Time: 5947-0761 PT Time Calculation (min) (ACUTE ONLY): 13 min  Charges:  $Gait Training: 8-22 mins                     Theresa Mccoy, [place] Pager [phone] Office [place] 07/18/2019, 1:51 PM

## 2019-07-18 NOTE — Consult Note (Signed)
[facility]iatry Consult   Reason for Consult:  Capacity  Referring Physician:  Margie Billet, PA Patient Identification: Theresa Mccoy MRN:  [id number] Principal Diagnosis: SBO (small bowel obstruction) ([place]) Diagnosis:  Principal Problem:   SBO (small bowel obstruction) ([place]) Active Problems:   Bipolar disorder, current episode mixed, moderate ([place])   Urinary tract infection   Hypercalcemia   Primary hyperparathyroidism ([place])  Total Time spent with patient: 1 hour  Subjective:   Theresa Mccoy is a 39 y.o. female patient admitted with small bowel obstruction, consult for capacity, history of bipolar d/o.  Patient seen and evaluated in person by this provider.  Pleasant on assessment with tangential thought processes.  She was able to confirm her psychiatric medications (see treatment plan below) and was alert and oriented times three.  No suicidal/homicidal ideations, hallucinations, or substance abuse (occasional cannabis).  She does not want to die but is not understanding that she could without IV antibiotics.  This provider attempted to explain the need for to have treatment while the patient kept saying she did not like this hospital and did not want to be here.  Then, continues stating she does not want to be in [place] but cannot not offer any explanation of why or where she would rather be.  Repetitive attempts at explaining her medical issues and need for IV.  "I just want to heal" but does not comprehend her situation and treatment need.  After extensive explanation, patient still reports she does not want the IV.  PATIENT DOES NOT HAVE CAPACITY TO MAKE MEDICAL DECISIONS,  Inability to process information provided on her health status.  HPI per MD on admission:  Patient is a 39 year old female with past medical history of anemia, depression, hypothyroidism, PTSD who presented for evaluation of abdominal pain which patient states started yesterday.  Patient states she  was in her normal state of health prior to yesterday.  Patient reports that pain was a constant sharp pain all throughout her abdomen that was worse with movement.  Patient states that she had 3 episodes of nonbloody, nonbilious emesis yesterday.  Reports having had 2 C-sections, denies other surgical history.  Patient reports she had a large, solid, brown, nonbloody bowel movement yesterday.  Patient denies fever, chest pain, shortness of breath, swelling, changes in weight.  Patient reports that she thought she was pregnant, went to the [facility] but was transferred to this emergency department following negative pregnancy test. Patient reports that she had some pain with urination today but none prior, reports a history of urinary incontinence which started following birth of last child.   Past Psychiatric History: bipolar d/o, PTSD  Risk to Self:  none Risk to Others:  none Prior Inpatient Therapy:  yes Prior Outpatient Therapy:  yes  Past Medical History:  Past Medical History:  Diagnosis Date  . Anemia   . Depression   . Hyperthyroidism   . PTSD (post-traumatic stress disorder)   . SBO (small bowel obstruction) ([place]) 07/07/2019  . Thyroid disease     Past Surgical History:  Procedure Laterality Date  . BOWEL RESECTION N/A 07/08/2019   Procedure: Small Bowel Resection;  Surgeon: Clovis Riley, MD;  Location: [facility];  Service: General;  Laterality: N/A;  . CESAREAN SECTION    . DILATION AND CURETTAGE OF UTERUS    . HAND SURGERY Left   . LAPAROTOMY N/A 07/08/2019   Procedure: EXPLORATORY LAPAROTOMY;  Surgeon: Clovis Riley, MD;  Location: [facility];  Service:  General;  Laterality: N/A;  . SALPINGECTOMY     Family History: History reviewed. No pertinent family history. Family Psychiatric  History: none Social History:  Social History   Substance and Sexual Activity  Alcohol Use No   Comment: sober since 02-2016     Social History   Substance and Sexual Activity  Drug  Use Not Currently  . Types: Marijuana    Social History   Socioeconomic History  . Marital status: Single    Spouse name: Not on file  . Number of children: 2  . Years of education: Not on file  . Highest education level: GED or equivalent  Occupational History  . Occupation: Unemployed  Tobacco Use  . Smoking status: Current Every Day Smoker    Packs/day: 0.25    Types: Cigarettes  . Smokeless tobacco: Never Used  Substance and Sexual Activity  . 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 Drug use: Not Currently    Types: Marijuana  . Sexual activity: Yes    Birth control/protection: None  Other Topics Concern  . Not on file  Social History Narrative   Pt stated that she lives in [place] with her fiance and two children, that she recently moved from [place], and also that she is pregnant.  She also stated that she is unemployed.   Social Determinants of Health   Financial Resource Strain: High Risk  . Difficulty of Paying Living Expenses: Hard  Food Insecurity: No Food Insecurity  . Worried About Charity fundraiser in the Last Year: Never true  . Ran Out of Food in the Last Year: Never true  Transportation Needs: Unmet Transportation Needs  . Lack of Transportation (Medical): Yes  . Lack of Transportation (Non-Medical): Yes  Physical Activity: Inactive  . Days of Exercise per Week: 0 days  . Minutes of Exercise per Session: 0 min  Stress: No Stress Concern Present  . Feeling of Stress : Not at all  Social Connections: Moderately Isolated  . Frequency of Communication with Friends and Family: Once a week  . Frequency of Social Gatherings with Friends and Family: Never  . Attends Religious Services: Never  . Active Member of Clubs or Organizations: No  . Attends Archivist Meetings: Never  . Marital Status: Living with partner   Additional Social History:    Allergies:   Allergies  Allergen Reactions  . Codeine Other (See Comments)     constipation    Labs: No results found for this or any previous visit (from the past 48 hour(s)).  Current Facility-Administered Medications  Medication Dose Route Frequency Provider Last Rate Last Admin  . acetaminophen (TYLENOL) tablet 650 mg  650 mg Oral Q6H PRN Meuth, Brooke A, PA-C   650 mg at 07/13/19 2027  . amoxicillin-clavulanate (AUGMENTIN) 875-125 MG per tablet 1 tablet  1 tablet Oral Q12H Greer Pickerel, MD   1 tablet at 07/18/19 1018  . Chlorhexidine Gluconate Cloth 2 % PADS 6 each  6 each Topical Daily Lucious Groves, DO   6 each at 07/18/19 1018  . feeding supplement (ENSURE ENLIVE) (ENSURE ENLIVE) liquid 237 mL  237 mL Oral TID BM Sid Falcon, MD   237 mL at 07/17/19 2052  . HYDROmorphone (DILAUDID) injection 0.5 mg  0.5 mg Intravenous Q4H PRN Meuth, Brooke A, PA-C   0.5 mg at 07/12/19 0347  . lactated ringers infusion   Intravenous Continuous Meuth, Brooke A, PA-C 75 mL/hr  at 07/14/19 0326 New Bag at 07/14/19 0326  . methocarbamol (ROBAXIN) tablet 500 mg  500 mg Oral Q8H PRN Meuth, Brooke A, PA-C   500 mg at 07/11/19 2204  . multivitamin with minerals tablet 1 tablet  1 tablet Oral Daily Sid Falcon, MD   1 tablet at 07/18/19 1018  . ondansetron (ZOFRAN) injection 4 mg  4 mg Intravenous Q6H PRN Romana Juniper A, MD   4 mg at 07/06/19 2114  . oxyCODONE (Oxy IR/ROXICODONE) immediate release tablet 5 mg  5 mg Oral Q4H PRN Meuth, Brooke A, PA-C   5 mg at 07/18/19 0509  . phenol (CHLORASEPTIC) mouth spray 1 spray  1 spray Mouth/Throat PRN Ladona Horns, MD   1 spray at 07/09/19 1448  . sodium chloride (OCEAN) 0.65 % nasal spray 1 spray  1 spray Each Nare PRN Ladona Horns, MD   1 spray at 07/09/19 1156  . sodium chloride flush (NS) 0.9 % injection 3 mL  3 mL Intravenous Q12H Romana Juniper A, MD   3 mL at 07/09/19 1157    Musculoskeletal: Strength & Muscle Tone: decreased Gait & Station: did not witness Patient leans: N/A  Psychiatric Specialty Exam: Physical Exam   Nursing note and vitals reviewed. Constitutional: She is oriented to person, place, and time. She appears well-developed and well-nourished.  HENT:  Head: Normocephalic.  Respiratory: Effort normal.  Musculoskeletal:     Cervical back: Normal range of motion.  Neurological: She is alert and oriented to person, place, and time.  Psychiatric: Judgment and thought content normal. Her mood appears anxious. Her affect is blunt. Her speech is tangential. She is slowed. Cognition and memory are impaired.    Review of Systems  Psychiatric/Behavioral: Positive for confusion. The patient is nervous/anxious.   All other systems reviewed and are negative.   Blood pressure 138/86, pulse 81, temperature 98.7 F (37.1 C), temperature source Oral, resp. rate 17, height 5' (1.524 m), weight 74.4 kg, SpO2 100 %, unknown if currently breastfeeding.Body mass index is 32.03 kg/m.  General Appearance: Casual  Eye Contact:  Good  Speech:  Normal Rate  Volume:  Normal  Mood:  Anxious  Affect:  Blunt  Thought Process:  Descriptions of Associations: Tangential  Orientation:  Full (Time, Place, and Person)  Thought Content:  Illogical and Tangential  Suicidal Thoughts:  No  Homicidal Thoughts:  No  Memory:  Immediate;   Fair Recent;   Fair Remote;   Fair  Judgement:  Impaired  Insight:  Lacking  Psychomotor Activity:  Decreased  Concentration:  Concentration: Fair and Attention Span: Fair  Recall:  [employer] of Knowledge:  Fair  Language:  Fair  Akathisia:  No  Handed:  Right  AIMS (if indicated):     Assets:  Leisure Time Resilience Social Support  ADL's:  Intact  Cognition:  Impaired,  Moderate  Sleep:       39 year old female admitted for perforated bowel with peritoneal abscesses in need of IV antibiotics, history of bipolar disorder.  On assessment she is confused at times about her medical condition with the inability to connect the need for IV antibiotic in the healing that she  requests.  Despite multiple attempts of education, she continues to deny IV and other selected treatments.  She does not have capacity at this time, social work consult should be placed for IVC for the purpose of treating her medically.  Treatment Plan Summary: Bipolar affective disorder, mixed, mild: -Restaart Risperdal 2 mg  BID  EPS: -Start Cogentin 0.5 mg daily  Anxiety: -Restart gabapentin 300 mg TID  Disposition: No evidence of imminent risk to self or others at present.   Patient does not meet criteria for psychiatric inpatient admission.  Waylan Boga, NP 07/18/2019 10:28 AM

## 2019-07-18 NOTE — TOC Progression Note (Signed)
Transition of Care [facility]) - Progression Note    Patient Details  Name: Theresa Mccoy MRN: [id number] Date of Birth: June 23, 1981  Transition of Care [facility]) CM/SW [place], [place] Phone Number: 07/18/2019, 1:10 PM  Clinical Narrative:    CSW acknowledging consult for medical IVC. Have staffed case with [facility] supervisor Theresa Or- at this time pt not attempting to leave hospital. Next steps should include speaking with pt significant other Theresa Mccoy. If Mr. Theresa Mccoy and pt are legally married then he can assist with making decisions if not then CSW aware pt has an adult son (>18yo) who is named Theresa Mccoy who should be contacted. If family wants to pursue further care (drain/iv abx) then they should speak with MD about how to complete these care needs and if restraints recommended. TOC team continues to follow, able to complete medical IVC if pt should attempt to leave the hospital.   CSW spoke with pt CCS PA Brooke regarding these next steps.    Expected Discharge Plan: Home/Self Care Barriers to Discharge: Continued Medical Work up  Expected Discharge Plan and Services Expected Discharge Plan: Home/Self Care   Discharge Planning Services: CM Consult Post Acute Care Choice: [place] arrangements for the past 2 months: Apartment Expected Discharge Date: 07/01/19               DME Arranged: N/A   Readmission Risk Interventions No flowsheet data found.

## 2019-07-18 NOTE — Progress Notes (Signed)
[facility] Progress Note  10 Days Post-Op  Subjective: CC-  CT yesterday showed multiple large abscesses. Patient refusing drain placement. Refusing IV and IV antibiotics. Abdomen feels about the same. Denies n/v. No appetite. Last BM 2 days ago.  Objective: Vital signs in last 24 hours: Temp:  [98 F (36.7 C)-99.8 F (37.7 C)] 98.7 F (37.1 C) (01/11 0459) Pulse Rate:  [77-85] 81 (01/11 0459) Resp:  [17-18] 17 (01/11 0459) BP: (138-151)/(86-89) 138/86 (01/11 0459) SpO2:  [98 %-100 %] 100 % (01/11 0459) Last BM Date: 07/15/19  Intake/Output from previous day: No intake/output data recorded. Intake/Output this shift: No intake/output data recorded.  PE: Gen: Alert, NAD, pleasant HEENT: EOM's intact, pupils equal and round Pulm:Rate andeffort normal Abd: Soft,mild distension, +BS,open midline incision pink without erythema or drainage/ no cellulitits, nontender Skin: no rashes noted, warm and dry    Lab Results:  No results for input(s): WBC, HGB, HCT, PLT in the last 72 hours. BMET No results for input(s): NA, K, CL, CO2, GLUCOSE, BUN, CREATININE, CALCIUM in the last 72 hours. PT/INR No results for input(s): LABPROT, INR in the last 72 hours. CMP     Component Value Date/Time   NA 141 07/14/2019 0209   NA 143 03/02/2017 1556   K 4.2 07/14/2019 0209   CL 113 (H) 07/14/2019 0209   CO2 21 (L) 07/14/2019 0209   GLUCOSE 84 07/14/2019 0209   BUN <5 (L) 07/14/2019 0209   BUN 9 03/02/2017 1556   CREATININE 0.68 07/14/2019 0209   CALCIUM 9.1 07/14/2019 0209   PROT 4.9 (L) 07/14/2019 0209   PROT 6.7 03/02/2017 1556   ALBUMIN 1.7 (L) 07/14/2019 0209   ALBUMIN 4.0 03/02/2017 1556   AST 17 07/14/2019 0209   ALT 19 07/14/2019 0209   ALKPHOS 65 07/14/2019 0209   BILITOT 0.3 07/14/2019 0209   BILITOT 0.2 03/02/2017 1556   GFRNONAA >60 07/14/2019 0209   GFRAA >60 07/14/2019 0209   Lipase     Component Value Date/Time   LIPASE 20 07/01/2019 2141        Studies/Results: CT ABDOMEN PELVIS W CONTRAST  Result Date: 07/17/2019 CLINICAL DATA:  Intra-abdominal abscess. EXAM: CT ABDOMEN AND PELVIS WITH CONTRAST TECHNIQUE: Multidetector CT imaging of the abdomen and pelvis was performed using the standard protocol following bolus administration of intravenous contrast. CONTRAST:  100 cc Omnipaque 300 COMPARISON:  07/13/2019 FINDINGS: Lower chest: Similar small to moderate bilateral pleural effusions with bilateral lower lobe collapse/consolidation. Hepatobiliary: Tiny 5 mm hypodensity in the posterior right liver is too small to characterize but likely benign. Probable tiny layering gallstones. No intrahepatic or extrahepatic biliary dilation. Pancreas: No focal mass lesion. No dilatation of the main duct. No intraparenchymal cyst. No peripancreatic edema. Spleen: No splenomegaly. No focal mass lesion. Adrenals/Urinary Tract: Right adrenal gland unremarkable. Stable 14 mm left adrenal nodule, incompletely characterized today but on a noncontrast CT of 07/02/2019 this had low attenuation compatible with adenoma. 8 mm nonobstructing stone noted lower pole right kidney. Tiny nonobstructing stone identified interpolar right kidney with stable appearance of a 12 mm interpolar right renal cyst. Left kidney unremarkable. No evidence for hydroureter. The urinary bladder appears normal for the degree of distention. Stomach/Bowel: Stomach is distended with fluid/dilute contrast. Duodenum is normally positioned as is the ligament of Treitz. Mild distention of fluid-filled left abdominal small bowel loops noted measuring up to 3.2 cm maximum diameter, stable to potentially minimally decreased since prior. Contrast material is seen and distal small  bowel loops and terminal ileum which are nondilated. Fluid and gas are noted in the right and transverse colon. Left colon is largely decompressed but does contain gas. Vascular/Lymphatic: No abdominal aortic aneurysm. No  abdominal aortic atherosclerotic calcification. There is no gastrohepatic or hepatoduodenal ligament lymphadenopathy. No retroperitoneal or mesenteric lymphadenopathy. No pelvic sidewall lymphadenopathy. Reproductive: The uterus is unremarkable.  There is no adnexal mass. Other: Free fluid is seen around the inferior liver and spleen interloop mesenteric fluid noted. Multiple rim enhancing fluid collections are identified in the peritoneal cavity of the lower abdomen and pelvis. Midline lens shaped rim enhancing fluid collection is identified just deep to the rectus sheath, measuring 4.7 x 2.0 x 7.7 cm tracking from the dome of the bladder cranially towards the umbilicus. Irregular right lower quadrant anterior rim enhancing fluid collection is also mainly lens shaped but has some fingers of rim enhancing fluid extending posteriorly towards its inferior margin. This collection is difficult to measure given the irregular shape, but is approximately 1.2 x 5.8 x 6.5 cm not including the finger-like projections inferiorly in the right lower quadrant Small 1.7 cm rim enhancing fluid collection identified anterior left lower quadrant on image 62/3. Oval fluid collection in the cul-de-sac measures 4.8 x 2.4 cm today compared to 4.4 x 3.6 cm on the study from 07/13/2019. There is some posterior enhancement which may be enhancement of the adjacent peritoneum as no definite anterior enhancing rim is evident. Musculoskeletal: Diffuse body wall edema noted with open anterior midline wound. No worrisome lytic or sclerotic osseous abnormality. IMPRESSION: 1. Multiple rim enhancing fluid collections in the peritoneal cavity of the lower abdomen and pelvis, compatible with evolving abscess pockets. Some of these collections appear better organized with more discrete rim enhancement on today's study compared to 07/13/2019. 2. Mild distention of fluid-filled left abdominal small bowel loops, stable to minimally decreased in the  interval. 3. Diffuse body wall edema with open anterior midline wound. 4. Stable small to moderate bilateral pleural effusions with bilateral lower lobe collapse/consolidation. 5. Cholelithiasis. 6. Nonobstructing right renal stones. 7. Stable 14 mm left adrenal nodule, compatible with adenoma on prior imaging. Electronically Signed   By: Misty Stanley M.D.   On: 07/17/2019 11:10    Anti-infectives: Anti-infectives (From admission, onward)   Start     Dose/Rate Route Frequency Ordered Stop   07/14/19 2200  amoxicillin-clavulanate (AUGMENTIN) 875-125 MG per tablet 1 tablet     1 tablet Oral Every 12 hours 07/14/19 1819     07/11/19 1115  fluconazole (DIFLUCAN) tablet 300 mg     300 mg Oral  Once 07/11/19 1104 07/11/19 1133   07/11/19 1000  itraconazole (SPORANOX) capsule 200 mg  Status:  Discontinued     200 mg Oral Daily 07/11/19 0957 07/11/19 1104   07/03/19 1400  piperacillin-tazobactam (ZOSYN) IVPB 3.375 g  Status:  Discontinued     3.375 g 12.5 mL/hr over 240 Minutes Intravenous Every 8 hours 07/03/19 1005 07/14/19 1819   07/03/19 1015  piperacillin-tazobactam (ZOSYN) IVPB 3.375 g  Status:  Discontinued     3.375 g 12.5 mL/hr over 240 Minutes Intravenous Every 8 hours 07/03/19 1005 07/03/19 1005   07/03/19 0900  cefTRIAXone (ROCEPHIN) 1 g in sodium chloride 0.9 % 100 mL IVPB     1 g 200 mL/hr over 30 Minutes Intravenous  Once 07/02/19 1351 07/03/19 0936   07/02/19 0830  cefTRIAXone (ROCEPHIN) 1 g in sodium chloride 0.9 % 100 mL IVPB  1 g 200 mL/hr over 30 Minutes Intravenous  Once 07/02/19 [id number] 07/02/19 1117       Assessment/Plan Depression Hypercalcemia/elevated PTH- 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 Kae Heller  - POD  #10 - BID wet to dry dressing changes to midline abdominal wound - CT 1/6 showed multiple small fluid collections and a larger one in the deep pelvis, IR unable to drain as small amount of fluid in pelvis and likely layering ascites  - CT 1/10 showed multiple abscesses in the peritoneal cavity   ID -zosyn 12/27>>1/7, augmentin 1/8>> FEN -soft diet, Ensure VTE -SCDs, lovenox Foley -none Follow up -Dr. Kae Heller  Plan-Patient continues to refuse IV and medical recommendations. Refusing IR drain despite discussing reasoning for placement and the importance of this to avoid risks of possibly worsening. Will ask psych to see for capacity.  Continue augmentin for now.   LOS: 16 days    [facility] 07/18/2019, 8:52 AM Please see Amion for pager number during day hours 7:00am-4:30pm

## 2019-07-18 NOTE — Progress Notes (Signed)
Made attempt to change patient's dressing; The patient refused at this time; Will continue to monitor.

## 2019-07-18 NOTE — Discharge Summary (Signed)
[facility] Discharge Summary   Patient ID: Theresa Mccoy MRN: [id number] DOB/AGE: 1981/03/15 39 y.o.  Admit date: 07/01/2019 Discharge date: 07/18/2019  Admitting Diagnosis: SBO  Discharge Diagnosis Patient Active Problem List   Diagnosis Date Noted  . Hypercalcemia 07/07/2019  . Primary hyperparathyroidism ([place]) 07/07/2019  . Urinary tract infection 07/02/2019  . SBO (small bowel obstruction) ([place]) 07/02/2019  . Supervision of high risk pregnancy, antepartum 05/17/2019  . Muscle cramps 02/02/2017  . Bipolar disorder, current episode mixed, moderate ([place]) 02/02/2017  . Generalized abdominal pain 02/02/2017  . Generalized anxiety disorder 02/02/2017  . Essential hypertension 02/02/2017    Consultants Family medicine Psychiatry  Imaging: CT ABDOMEN PELVIS W CONTRAST  Result Date: 07/17/2019 CLINICAL DATA:  Intra-abdominal abscess. EXAM: CT ABDOMEN AND PELVIS WITH CONTRAST TECHNIQUE: Multidetector CT imaging of the abdomen and pelvis was performed using the standard protocol following bolus administration of intravenous contrast. CONTRAST:  100 cc Omnipaque 300 COMPARISON:  07/13/2019 FINDINGS: Lower chest: Similar small to moderate bilateral pleural effusions with bilateral lower lobe collapse/consolidation. Hepatobiliary: Tiny 5 mm hypodensity in the posterior right liver is too small to characterize but likely benign. Probable tiny layering gallstones. No intrahepatic or extrahepatic biliary dilation. Pancreas: No focal mass lesion. No dilatation of the main duct. No intraparenchymal cyst. No peripancreatic edema. Spleen: No splenomegaly. No focal mass lesion. Adrenals/Urinary Tract: Right adrenal gland unremarkable. Stable 14 mm left adrenal nodule, incompletely characterized today but on a noncontrast CT of 07/02/2019 this had low attenuation compatible with adenoma. 8 mm nonobstructing stone noted lower pole right kidney. Tiny nonobstructing stone identified  interpolar right kidney with stable appearance of a 12 mm interpolar right renal cyst. Left kidney unremarkable. No evidence for hydroureter. The urinary bladder appears normal for the degree of distention. Stomach/Bowel: Stomach is distended with fluid/dilute contrast. Duodenum is normally positioned as is the ligament of Treitz. Mild distention of fluid-filled left abdominal small bowel loops noted measuring up to 3.2 cm maximum diameter, stable to potentially minimally decreased since prior. Contrast material is seen and distal small bowel loops and terminal ileum which are nondilated. Fluid and gas are noted in the right and transverse colon. Left colon is largely decompressed but does contain gas. Vascular/Lymphatic: No abdominal aortic aneurysm. No abdominal aortic atherosclerotic calcification. There is no gastrohepatic or hepatoduodenal ligament lymphadenopathy. No retroperitoneal or mesenteric lymphadenopathy. No pelvic sidewall lymphadenopathy. Reproductive: The uterus is unremarkable.  There is no adnexal mass. Other: Free fluid is seen around the inferior liver and spleen interloop mesenteric fluid noted. Multiple rim enhancing fluid collections are identified in the peritoneal cavity of the lower abdomen and pelvis. Midline lens shaped rim enhancing fluid collection is identified just deep to the rectus sheath, measuring 4.7 x 2.0 x 7.7 cm tracking from the dome of the bladder cranially towards the umbilicus. Irregular right lower quadrant anterior rim enhancing fluid collection is also mainly lens shaped but has some fingers of rim enhancing fluid extending posteriorly towards its inferior margin. This collection is difficult to measure given the irregular shape, but is approximately 1.2 x 5.8 x 6.5 cm not including the finger-like projections inferiorly in the right lower quadrant Small 1.7 cm rim enhancing fluid collection identified anterior left lower quadrant on image 62/3. Oval fluid collection  in the cul-de-sac measures 4.8 x 2.4 cm today compared to 4.4 x 3.6 cm on the study from 07/13/2019. There is some posterior enhancement which may be enhancement of the adjacent peritoneum as no definite anterior enhancing  rim is evident. Musculoskeletal: Diffuse body wall edema noted with open anterior midline wound. No worrisome lytic or sclerotic osseous abnormality. IMPRESSION: 1. Multiple rim enhancing fluid collections in the peritoneal cavity of the lower abdomen and pelvis, compatible with evolving abscess pockets. Some of these collections appear better organized with more discrete rim enhancement on today's study compared to 07/13/2019. 2. Mild distention of fluid-filled left abdominal small bowel loops, stable to minimally decreased in the interval. 3. Diffuse body wall edema with open anterior midline wound. 4. Stable small to moderate bilateral pleural effusions with bilateral lower lobe collapse/consolidation. 5. Cholelithiasis. 6. Nonobstructing right renal stones. 7. Stable 14 mm left adrenal nodule, compatible with adenoma on prior imaging. Electronically Signed   By: Misty Stanley M.D.   On: 07/17/2019 11:10    Procedures Dr. Kae Heller (07/08/19) - Exploratory laparotomy, small bowel resection  Hospital Course:  Theresa Mccoy is a 39yo female PMH depression, hyperthyroidism, and major depressive disorder with delusion, who presented to [facility] hospital 12/25 believing she was pregnant. She underwent workup and pregnancy ruled out and transferred to [facility] ED. CT scan showed small bowel obstruction - likely adhesive from prior c sections and possible salpingectomy. Patient was admitted to the hospital, NG tube placed for decompression, and she was started on the small bowel protocol. Initially she began to clinically improve with return in bowel function, but ultimately the obstruction did not resolve with conservative management and she was taken to the OR 07/08/19. Intraoperatively she was found to  have feculent peritonitis and necrotic small bowel. She underwent exploratory laparotomy with small bowel resection. She was kept on zosyn postoperatively for peritonitis. IV fell out and patient refused replacement, therefore antibiotics were switched to augmentin on 1/8. Bowel function returned and diet was advanced as tolerated. Due to persistent leukocytosis CT scan was repeated 1/7 and again on 1/10. She was found to have multiple intraabdominal abscesses. IR drainage was recommended but patient refused. She also refused labwork. Psychiatry was consulted and deemed patient not to have the capacity to make informed decision, therefore decisions would be up to her husband. Multiple discussions were held regarding her medical treatment, and ultimately he decided for her not to proceed with IR drain placement. She was nontoxic appearing with minimal abdominal pain, and vital signs stable. On 1/11 she was discharged home with 3 weeks of augmentin and given strict return precautions to include fever, nausea/vomiting, worsening abdominal pain. Patient will follow up as below and knows to call with questions or concerns.    I have personally reviewed the patients medication history on the [place] controlled substance database.    Allergies as of 07/18/2019      Reactions   Codeine Other (See Comments)   constipation      Medication List    TAKE these medications   acetaminophen 325 MG tablet Commonly known as: TYLENOL Take 2 tablets (650 mg total) by mouth every 6 (six) hours as needed for mild pain.   amoxicillin-clavulanate 875-125 MG tablet Commonly known as: AUGMENTIN Take 1 tablet by mouth every 12 (twelve) hours for 21 days.   benztropine 0.5 MG tablet Commonly known as: COGENTIN Take 1 tablet (0.5 mg total) by mouth 2 (two) times daily.   Blood Pressure Monitor Automat Devi 1 Device by Does not apply route daily. Automatic blood pressure cuff with regular cuff. Blood pressure to be monitored  regularly at home. ICD-10 code: O09.90.   gabapentin 300 MG capsule Commonly known as: NEURONTIN Take  1 capsule (300 mg total) by mouth 3 (three) times daily.   multivitamin with minerals Tabs tablet Take 1 tablet by mouth daily. Start taking on: July 19, 2019   oxyCODONE 5 MG immediate release tablet Commonly known as: Oxy IR/ROXICODONE Take 1 tablet (5 mg total) by mouth every 6 (six) hours as needed for severe pain.   prenatal multivitamin Tabs tablet Take 1 tablet by mouth daily.   risperiDONE 2 MG tablet Commonly known as: RISPERDAL 1 in am 2 at hs What changed:   how much to take  when to take this  additional instructions   risperiDONE microspheres 50 MG injection Commonly known as: RISPERDAL CONSTA Inject 2 mLs (50 mg total) into the muscle every 21 ( twenty-one) days. Due 4/27            Durable Medical Equipment  (From admission, onward)         Start     Ordered   07/18/19 1409  For home use only DME 3 n 1  Once     07/18/19 1408           Follow-up Information    Kerin Perna, NP. Call.   Specialty: Internal Medicine Why: Call to arrange post-hospitalization follow up appointment Contact information: [id number] [address] [phone]        Clovis Riley, MD. Go on 08/03/2019.   Specialty: General Surgery Why: Your appointment is 01/27 at 9:50 am Please arrive 30 minutes prior to your appointment to check in and fill out paperwork. Bring photo ID and insurance information. Contact information: [address] [place] [address] [phone]           Signed: Wellington Hampshire, [facility] 07/18/2019, 3:41 PM Please see Amion for pager number during day hours 7:00am-4:30pm

## 2019-07-18 NOTE — Progress Notes (Signed)
I spoke with the patient and her husband (via telephone) this afternoon. She has been deemed not to have the capacity to make informed decision, therefore decisions would be up to her husband. I explained that she has multiple intra-abdominal abscesses on her CT scan and we would recommend IV antibiotics and IR drain. This would help to treat the infection and potentially decrease the risk of her getting ill or worst case scenario dying. Patient's husband explained this to the patient as well, and advised that he would allow these treatments if he were the patient. Patient still declines interventions. Her husband would not want to chemically to physically restrain her to complete these interventions. He states that if she does not agree to them then he will not subject her to any further treatment. She did agree to letting us check a CBC again. If WBC significantly higher, we will revisit the discussion. If WBC is stable or lower, since she is currently nontoxic appearing, we will allow her to be discharged on 3 weeks of augmentin with strict return precautions (ie fever, nausea/vomiting, worsening abdominal pain...).  Wellington Hampshire, [facility] 07/18/2019, 2:18 PM Please see Amion for pager number during day hours 7:00am-4:30pm

## 2019-07-19 ENCOUNTER — Telehealth: Payer: Self-pay

## 2019-07-19 NOTE — Telephone Encounter (Signed)
Transition Care Management Follow-up Telephone Call Date of discharge and from where: 07/18/2019, [facility]   Call placed to patient # [phone], message left with call back requested to this CM.  Call placed to # [phone], her husband, Sherrie Sport, answered and suggested that she be contacted on her cell phone.   Informed him that this CM already left a message for her on that number. He also had the number for this clinic. Instructed him to have the patient return the call to this CM.

## 2019-07-20 ENCOUNTER — Telehealth: Payer: Self-pay

## 2019-07-20 NOTE — Telephone Encounter (Signed)
Transition Care Management Follow-up Telephone Call Date of discharge and from where:07/18/2019,[facility]  Attempted to contact the patient # [phone], message left with call back requested to this CM.  The patient has called and scheduled an appt at [facility] 08/11/2019

## 2019-08-11 ENCOUNTER — Telehealth (INDEPENDENT_AMBULATORY_CARE_PROVIDER_SITE_OTHER): Payer: Medicaid Other | Admitting: Primary Care

## 2019-09-26 ENCOUNTER — Encounter (INDEPENDENT_AMBULATORY_CARE_PROVIDER_SITE_OTHER): Payer: Self-pay | Admitting: Family Medicine

## 2019-09-26 ENCOUNTER — Ambulatory Visit (INDEPENDENT_AMBULATORY_CARE_PROVIDER_SITE_OTHER): Payer: Medicaid Other | Admitting: Family Medicine

## 2019-09-26 ENCOUNTER — Other Ambulatory Visit: Payer: Self-pay

## 2019-09-26 VITALS — BP 136/103 | HR 88 | Temp 97.2°F | Ht 60.0 in | Wt 130.2 lb

## 2019-09-26 DIAGNOSIS — L80 Vitiligo: Secondary | ICD-10-CM

## 2019-09-26 DIAGNOSIS — R030 Elevated blood-pressure reading, without diagnosis of hypertension: Secondary | ICD-10-CM | POA: Diagnosis not present

## 2019-09-26 DIAGNOSIS — F3162 Bipolar disorder, current episode mixed, moderate: Secondary | ICD-10-CM

## 2019-09-26 DIAGNOSIS — D62 Acute posthemorrhagic anemia: Secondary | ICD-10-CM

## 2019-09-26 DIAGNOSIS — R109 Unspecified abdominal pain: Secondary | ICD-10-CM

## 2019-09-26 NOTE — Patient Instructions (Signed)
Healthy Eating to Prevent Digestive Disorders The digestive system starts at the mouth and goes all the way down to the rectum. Along the way, your digestive system breaks down the food you eat so you can absorb its nutrients and use them for energy. Digestive disorders can cause gas, bloating, pain, heartburn, and other symptoms. They can prevent your digestive system from doing its job. Healthy eating and a healthy lifestyle can help you avoid many common digestive disorders. What nutrition changes can be made? Start by eating a balanced diet. Eat healthy foods from all the major food groups. These include carbohydrates, fats, and proteins. Other changes you can make include to:  Eat enough fiber. Fiber is a healthy carbohydrate that cleans out your digestive system. Fiber absorbs water and helps you have regular bowel movements. Fiber comes from plants. To get enough fiber in your diet, eat 4-5 servings of fruits, vegetables, and legumes every day. Include beans and whole grains. Most people should get 20?35 grams of fiber each day.  Drink enough water to keep your urine clear or pale yellow. Water helps your body digest food. It can also help prevent constipation.  Avoid fatty proteins. Full-fat dairy products and fatty meats are hard to digest. Fats you want to avoid are those that get solid at room temperature (saturated fats). Instead of eating these kinds of fats, eat plant-based unsaturated fats found in olives, canola, corn, avocado, and nuts.  If you have trouble with gas, belching, or flatulence, avoid gas-producing foods. These include beans, carbonated beverages, cabbage, cauliflower, and broccoli. If you are lactose intolerant, avoid dairy products or choose lactose-free dairy products.  If you have frequent heartburn, stay away from alcohol, caffeine, fatty foods, chocolate, and peppermint. Avoid lying down within two hours of eating a full meal. Overeating and lying down too soon  after a meal can cause heartburn.  Add probiotics to your diet. Healthy digestion depends on having the right balance of good bacteria in your colon. Probiotics can help restore the balance of good bacteria in your digestive system. Probiotics are live active cultures that are found in yogurt, kefir, and cultured foods like sauerkraut and miso. You can also add good bacteria with probiotic supplements.  Make sure to chew your food slowly and completely.  Instead of eating three large meals each day, eat three small meals with three small snacks.  What other changes can I make? You can help your digestive system stay healthy by making these lifestyle changes:  Stay active and exercise every day.  Maintain a healthy weight.  Eat on a regular schedule.  Avoid tight-fitting clothes. They can restrict digestion.  If you have frequent heartburn, raise the head of your bed 2-3 inches (5-7.5 cm).  Do not use any tobacco products, such as cigarettes, chewing tobacco, and e-cigarettes. If you need help quitting, ask your health care provider.  Limit alcohol intake to no more than 1 drink a day for nonpregnant women and 2 drinks a day for men. One drink equals 12 oz of beer, 5 oz of wine, or 1 oz of hard liquor.  Avoid stress. Find ways to reduce stress, such as meditation, exercise, or taking time for activities that relax you. Why should I make these changes? Making these changes will help your digestive system function at its best. A healthy digestive system can help you avoid or improve your management of digestive disorders such as:  Bloating, gas, and flatulence.  Heartburn.  Gastroesophageal reflux  disease (GERD).  Peptic ulcer disease.  Hemorrhoids.  Diverticulitis.  Constipation.  Diarrhea.  Gall stones  Irritable bowel syndrome.  Malnutrition.  Fatty liver disease. What can happen if changes are not made? Not making these changes could put you at risk for many  conditions caused by a poor diet or an unhealthy weight, such as heart disease, stroke and diabetes. Where can I get more information? Learn more about healthy eating and digestive disorders by visiting these websites:  Academy of Nutrition and Dietetics: DenimDistribution.com.ee  Centers for Disease Control and Prevention: CoinSpecialists.co.za  U.S. Department of Health and Human Services: [place]CarWash.com.cy.pdf Summary  A heathy diet can help prevent many digestive disorders.  Eat a balanced diet consisting of fiber, unsaturated fats, lean protein, fruits, and vegetables.  Eat three small meals with three small snacks per day.  Drink plenty of water every day.  Get plenty of exercise and maintain a healthy weight. This information is not intended to replace advice given to you by your health care provider. Make sure you discuss any questions you have with your health care provider. Document Revised: 07/03/2016 Document Reviewed: 03/05/2016 Elsevier Patient Education  2020 [employer].

## 2019-09-26 NOTE — Progress Notes (Signed)
Patient ID: Theresa Mccoy, female    DOB: October 04, 1980, 39 y.o.   MRN: [id number]  PCP: Kerin Perna, NP  Chief Complaint  Patient presents with  . Abdominal Discomfort    Subjective:  HPI Theresa Mccoy is a 39 y.o. female presents to get established as a new patient.  Medical history significant for has Muscle cramps; Bipolar disorder, current episode mixed, moderate ([place]); Generalized abdominal pain; Generalized anxiety disorder; Essential hypertension; Supervision of high risk pregnancy, antepartum; Urinary tract infection; SBO (small bowel obstruction) ([place]); Hypercalcemia; and Primary hyperparathyroidism ([place]) on their problem list.  Theresa Mccoy is s/p exploratory lap for repair of SBO in December. She initially thought she was pregnant. She followed up with OB, had negative OB ultrasound and negative HCG. Subsequently presented to the ER on 06/30/20 with abdominal pain and found to have a small bowel obstruction. Reports she continues to experience sensation "feel like I'm still pregnant".  Denies any overt abdominal pain, nausea, or vomiting. Last CBC collect s/p procedures revealed low hemoglobin 8.6. She denies new fatigue or increased drowsiness. Endorses poor diet do limited food preparation services as she lives in a motel room with microwave as only means to prepare meals.  She endorses compliance with mental health medication although voices difficulty scheduling follow-up at [facility] mental health services. She is requesting a referral to new [facility] provider. Denies SI or auditory hallucinations. Smokes marijuana in addition to taking mental health medications due to marijuana helps her feel "less crazy and nervous".   Lastly she is requesting a dermatology referral for evaluation of worsening vitiligo affecting her trunk and extremities.   Social History   Socioeconomic History  . Marital status: Married    Spouse name: Not on file  . Number of children: 2  . Years of  education: Not on file  . Highest education level: GED or equivalent  Occupational History  . Occupation: Unemployed  Tobacco Use  . Smoking status: Current Every Day Smoker    Packs/day: 0.25    Types: Cigarettes  . Smokeless tobacco: Never Used  Substance and Sexual Activity  . 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 Drug use: Not Currently    Types: Marijuana  . Sexual activity: Yes    Birth control/protection: None  Other Topics Concern  . Not on file  Social History Narrative   Pt stated that she lives in [place] with her fiance and two children, that she recently moved from [place], and also that she is pregnant.  She also stated that she is unemployed.   Social Determinants of Health   Financial Resource Strain: High Risk  . Difficulty of Paying Living Expenses: Hard  Food Insecurity: No Food Insecurity  . Worried About Charity fundraiser in the Last Year: Never true  . Ran Out of Food in the Last Year: Never true  Transportation Needs: Unmet Transportation Needs  . Lack of Transportation (Medical): Yes  . Lack of Transportation (Non-Medical): Yes  Physical Activity: Inactive  . Days of Exercise per Week: 0 days  . Minutes of Exercise per Session: 0 min  Stress: No Stress Concern Present  . Feeling of Stress : Not at all  Social Connections: Moderately Isolated  . Frequency of Communication with Friends and Family: Once a week  . Frequency of Social Gatherings with Friends and Family: Never  . Attends Religious Services: Never  . Active Member of Clubs or Organizations: No  . Attends Club or  Organization Meetings: Never  . Marital Status: Living with partner  Intimate Partner Violence: Not At Risk  . Fear of Current or Ex-Partner: No  . Emotionally Abused: No  . Physically Abused: No  . Sexually Abused: No    No family history on file.   Review of Systems Pertinent negatives listed in HPI Patient Active Problem List   Diagnosis Date Noted   . Hypercalcemia 07/07/2019  . Primary hyperparathyroidism ([place]) 07/07/2019  . Urinary tract infection 07/02/2019  . SBO (small bowel obstruction) ([place]) 07/02/2019  . Supervision of high risk pregnancy, antepartum 05/17/2019  . Muscle cramps 02/02/2017  . Bipolar disorder, current episode mixed, moderate ([place]) 02/02/2017  . Generalized abdominal pain 02/02/2017  . Generalized anxiety disorder 02/02/2017  . Essential hypertension 02/02/2017    Allergies  Allergen Reactions  . Codeine Other (See Comments)    constipation    Prior to Admission medications   Medication Sig Start Date End Date Taking? Authorizing Provider  acetaminophen (TYLENOL) 325 MG tablet Take 2 tablets (650 mg total) by mouth every 6 (six) hours as needed for mild pain. 07/18/19  Yes Meuth, Brooke A, PA-C  benztropine (COGENTIN) 0.5 MG tablet Take 1 tablet (0.5 mg total) by mouth 2 (two) times daily. 10/11/18  Yes Johnn Hai, MD  Blood Pressure Monitoring (BLOOD PRESSURE MONITOR AUTOMAT) DEVI 1 Device by Does not apply route daily. Automatic blood pressure cuff with regular cuff. Blood pressure to be monitored regularly at home. ICD-10 code: O09.90. 05/17/19  Yes Donnamae Jude, MD  gabapentin (NEURONTIN) 300 MG capsule Take 1 capsule (300 mg total) by mouth 3 (three) times daily. 10/11/18  Yes Johnn Hai, MD  Multiple Vitamin (MULTIVITAMIN WITH MINERALS) TABS tablet Take 1 tablet by mouth daily. 07/19/19  Yes Meuth, Brooke A, PA-C  risperiDONE (RISPERDAL) 2 MG tablet 1 in am 2 at hs Patient taking differently: 2-4 mg 2 (two) times daily. 2mg  in am and 4mg  at hs 10/11/18  Yes Johnn Hai, MD  risperiDONE microspheres (RISPERDAL CONSTA) 50 MG injection Inject 2 mLs (50 mg total) into the muscle every 21 ( twenty-one) days. Due 4/27 10/11/18  Yes Johnn Hai, MD    Past Medical, Surgical Family and Social History reviewed and updated.    Objective:   Today's Vitals   09/26/19 1540  BP: (!) 136/103  Pulse: 88  Temp:  (!) 97.2 F (36.2 C)  TempSrc: Temporal  SpO2: 96%  Weight: 130 lb 3.2 oz (59.1 kg)  Height: 5' (1.524 m)  PainSc: 0-No pain    Wt Readings from Last 3 Encounters:  09/26/19 130 lb 3.2 oz (59.1 kg)  07/02/19 164 lb (74.4 kg)  06/09/19 126 lb 6.4 oz (57.3 kg)     Physical Exam Constitutional:      Appearance: She is not ill-appearing or toxic-appearing.     Comments: Wearing two pairs of glasses stacked ontop of each other   HENT:     Head: Normocephalic.  Eyes:     General: Lids are normal.  Pulmonary:     Effort: Pulmonary effort is normal.     Breath sounds: Normal breath sounds and air entry.  Abdominal:     General: Abdomen is flat. Bowel sounds are normal. There is no distension.     Palpations: Abdomen is soft.     Tenderness: There is no abdominal tenderness.  Musculoskeletal:     Cervical back: Normal range of motion and neck supple.  Skin:    General:  Skin is warm and dry.  Neurological:     General: No focal deficit present.     Mental Status: She is alert and oriented to person, place, and time.  Psychiatric:        Attention and Perception: Attention normal.        Mood and Affect: Mood is elated.        Behavior: Behavior is cooperative.        Thought Content: Thought content normal.    .  No results found for: POCGLU  Lab Results  Component Value Date   HGBA1C 5.3 02/02/2017         Assessment & Plan:  1. Bipolar disorder, current episode mixed, moderate ([facility]) -Continue Risperdal, Cogentin, and Gabapentin  - Ambulatory referral to Psychiatry - TSH, pending   2. Vitiligo - Ambulatory referral to Dermatology  3. Acute blood loss anemia, s/p blood transfusion Jan. 21 - CBC with Differential, pending   4. Elevated BP without diagnosis of hypertension - Basic metabolic panel, evaluate renal function and electrolytes   5. Abdominal discomfort -Encouraged to avoid skipping meals. -Eat foods low salt and sugar    -The patient was  given clear instructions to go to ER or return to medical center if symptoms do not improve, worsen or new problems develop. The patient verbalized understanding.   Molli Barrows, FNP (Covering Provider) [place].  [place], [place] [phone]

## 2019-09-27 ENCOUNTER — Telehealth: Payer: Self-pay | Admitting: Family Medicine

## 2019-09-27 LAB — TSH: TSH: 0.522 u[IU]/mL (ref 0.450–4.500)

## 2019-09-27 LAB — CBC WITH DIFFERENTIAL/PLATELET
Basophils Absolute: 0.1 10*3/uL (ref 0.0–0.2)
Basos: 1 %
EOS (ABSOLUTE): 0.3 10*3/uL (ref 0.0–0.4)
Eos: 5 %
Hematocrit: 38.4 % (ref 34.0–46.6)
Hemoglobin: 12.9 g/dL (ref 11.1–15.9)
Immature Grans (Abs): 0 10*3/uL (ref 0.0–0.1)
Immature Granulocytes: 0 %
Lymphocytes Absolute: 2.9 10*3/uL (ref 0.7–3.1)
Lymphs: 43 %
MCH: 29.9 pg (ref 26.6–33.0)
MCHC: 33.6 g/dL (ref 31.5–35.7)
MCV: 89 fL (ref 79–97)
Monocytes Absolute: 0.6 10*3/uL (ref 0.1–0.9)
Monocytes: 9 %
Neutrophils Absolute: 2.8 10*3/uL (ref 1.4–7.0)
Neutrophils: 42 %
Platelets: 345 10*3/uL (ref 150–450)
RBC: 4.31 x10E6/uL (ref 3.77–5.28)
RDW: 13.3 % (ref 11.7–15.4)
WBC: 6.6 10*3/uL (ref 3.4–10.8)

## 2019-09-27 LAB — BASIC METABOLIC PANEL
BUN/Creatinine Ratio: 6 — ABNORMAL LOW (ref 9–23)
BUN: 5 mg/dL — ABNORMAL LOW (ref 6–20)
CO2: 23 mmol/L (ref 20–29)
Calcium: 12.8 mg/dL — ABNORMAL HIGH (ref 8.7–10.2)
Chloride: 107 mmol/L — ABNORMAL HIGH (ref 96–106)
Creatinine, Ser: 0.77 mg/dL (ref 0.57–1.00)
GFR calc Af Amer: 112 mL/min/{1.73_m2} (ref >59)
GFR calc non Af Amer: 98 mL/min/{1.73_m2} (ref >59)
Glucose: 87 mg/dL (ref 65–99)
Potassium: 4 mmol/L (ref 3.5–5.2)
Sodium: 141 mmol/L (ref 134–144)

## 2019-09-27 NOTE — Telephone Encounter (Signed)
Left message asking patient to call our office as soon as possible.

## 2019-09-27 NOTE — Telephone Encounter (Signed)
Please contact patient to have her return to the office to have a PTH level checked. Her calcium level is very elevated and she needs evaluation of parathyroid gland to ensure it the gland is not overactive. Could you let me know if she is able to come to the office today and or tomorrow. I am out of the office two weeks after tomorrow and labs will need to be reviewed.

## 2019-09-29 ENCOUNTER — Other Ambulatory Visit: Payer: Self-pay

## 2019-09-29 ENCOUNTER — Other Ambulatory Visit (INDEPENDENT_AMBULATORY_CARE_PROVIDER_SITE_OTHER): Payer: Medicaid Other

## 2019-09-29 ENCOUNTER — Telehealth (INDEPENDENT_AMBULATORY_CARE_PROVIDER_SITE_OTHER): Payer: Self-pay

## 2019-09-29 NOTE — Telephone Encounter (Signed)
This can be purchased over the counter and no order/ prescription sent

## 2019-09-29 NOTE — Telephone Encounter (Signed)
Sent to PCP ?

## 2019-09-29 NOTE — Telephone Encounter (Signed)
Patient called to make a medication refill for   acetaminophen (TYLENOL) 325 MG tablet   Patient uses   [facility] [phone] - [place], [address]    Please advice [phone]

## 2019-09-30 ENCOUNTER — Other Ambulatory Visit (INDEPENDENT_AMBULATORY_CARE_PROVIDER_SITE_OTHER): Payer: Self-pay | Admitting: Primary Care

## 2019-09-30 DIAGNOSIS — E215 Disorder of parathyroid gland, unspecified: Secondary | ICD-10-CM

## 2019-09-30 LAB — PTH, INTACT AND CALCIUM
Calcium: 11.7 mg/dL — ABNORMAL HIGH (ref 8.7–10.2)
PTH: 109 pg/mL — ABNORMAL HIGH (ref 15–65)

## 2019-09-30 LAB — CALCIUM, IONIZED: Calcium, Ion: 6.8 mg/dL (ref 4.5–5.6)

## 2020-01-22 ENCOUNTER — Emergency Department (HOSPITAL_COMMUNITY): Payer: Medicaid Other

## 2020-01-22 ENCOUNTER — Inpatient Hospital Stay (HOSPITAL_COMMUNITY)
Admission: EM | Admit: 2020-01-22 | Discharge: 2020-01-27 | DRG: 643 | Disposition: A | Discharge status: Home / Self Care | Payer: Medicaid Other | Attending: Internal Medicine | Admitting: Internal Medicine

## 2020-01-22 DIAGNOSIS — A419 Sepsis, unspecified organism: Secondary | ICD-10-CM | POA: Diagnosis not present

## 2020-01-22 DIAGNOSIS — N390 Urinary tract infection, site not specified: Secondary | ICD-10-CM | POA: Diagnosis present

## 2020-01-22 DIAGNOSIS — R4182 Altered mental status, unspecified: Secondary | ICD-10-CM

## 2020-01-22 DIAGNOSIS — E861 Hypovolemia: Secondary | ICD-10-CM | POA: Diagnosis present

## 2020-01-22 DIAGNOSIS — N9489 Other specified conditions associated with female genital organs and menstrual cycle: Secondary | ICD-10-CM

## 2020-01-22 DIAGNOSIS — G9340 Encephalopathy, unspecified: Secondary | ICD-10-CM | POA: Diagnosis not present

## 2020-01-22 DIAGNOSIS — N179 Acute kidney failure, unspecified: Secondary | ICD-10-CM

## 2020-01-22 DIAGNOSIS — F3162 Bipolar disorder, current episode mixed, moderate: Secondary | ICD-10-CM | POA: Diagnosis present

## 2020-01-22 DIAGNOSIS — Z20822 Contact with and (suspected) exposure to covid-19: Secondary | ICD-10-CM | POA: Diagnosis present

## 2020-01-22 DIAGNOSIS — R569 Unspecified convulsions: Secondary | ICD-10-CM | POA: Diagnosis not present

## 2020-01-22 DIAGNOSIS — N200 Calculus of kidney: Secondary | ICD-10-CM | POA: Diagnosis present

## 2020-01-22 DIAGNOSIS — I9589 Other hypotension: Secondary | ICD-10-CM | POA: Diagnosis present

## 2020-01-22 DIAGNOSIS — E870 Hyperosmolality and hypernatremia: Secondary | ICD-10-CM

## 2020-01-22 DIAGNOSIS — F17210 Nicotine dependence, cigarettes, uncomplicated: Secondary | ICD-10-CM | POA: Diagnosis present

## 2020-01-22 DIAGNOSIS — Z79899 Other long term (current) drug therapy: Secondary | ICD-10-CM

## 2020-01-22 DIAGNOSIS — R000 Tachycardia, unspecified: Secondary | ICD-10-CM | POA: Diagnosis present

## 2020-01-22 DIAGNOSIS — E860 Dehydration: Secondary | ICD-10-CM

## 2020-01-22 DIAGNOSIS — R6520 Severe sepsis without septic shock: Secondary | ICD-10-CM

## 2020-01-22 DIAGNOSIS — G9341 Metabolic encephalopathy: Secondary | ICD-10-CM | POA: Diagnosis present

## 2020-01-22 DIAGNOSIS — R6511 Systemic inflammatory response syndrome (SIRS) of non-infectious origin with acute organ dysfunction: Secondary | ICD-10-CM | POA: Diagnosis present

## 2020-01-22 DIAGNOSIS — I959 Hypotension, unspecified: Secondary | ICD-10-CM

## 2020-01-22 DIAGNOSIS — E210 Primary hyperparathyroidism: Secondary | ICD-10-CM | POA: Diagnosis present

## 2020-01-22 DIAGNOSIS — R531 Weakness: Secondary | ICD-10-CM

## 2020-01-22 HISTORY — DX: Sepsis, unspecified organism: A41.9

## 2020-01-22 HISTORY — DX: Acute kidney failure, unspecified: N17.9

## 2020-01-22 HISTORY — DX: Hyperosmolality and hypernatremia: E87.0

## 2020-01-22 LAB — CBC WITH DIFFERENTIAL/PLATELET
Abs Immature Granulocytes: 0.26 10*3/uL — ABNORMAL HIGH (ref 0.00–0.07)
Basophils Absolute: 0 10*3/uL (ref 0.0–0.1)
Basophils Relative: 0 %
Eosinophils Absolute: 0 10*3/uL (ref 0.0–0.5)
Eosinophils Relative: 0 %
HCT: 49 % — ABNORMAL HIGH (ref 36.0–46.0)
Hemoglobin: 14.9 g/dL (ref 12.0–15.0)
Immature Granulocytes: 1 %
Lymphocytes Relative: 13 %
Lymphs Abs: 3.2 10*3/uL (ref 0.7–4.0)
MCH: 30.2 pg (ref 26.0–34.0)
MCHC: 30.4 g/dL (ref 30.0–36.0)
MCV: 99.4 fL (ref 80.0–100.0)
Monocytes Absolute: 1.4 10*3/uL — ABNORMAL HIGH (ref 0.1–1.0)
Monocytes Relative: 6 %
Neutro Abs: 19.9 10*3/uL — ABNORMAL HIGH (ref 1.7–7.7)
Neutrophils Relative %: 80 %
Platelets: 295 10*3/uL (ref 150–400)
RBC: 4.93 MIL/uL (ref 3.87–5.11)
RDW: 13.9 % (ref 11.5–15.5)
WBC: 24.7 10*3/uL — ABNORMAL HIGH (ref 4.0–10.5)
nRBC: 0.1 % (ref 0.0–0.2)

## 2020-01-22 LAB — SARS CORONAVIRUS 2 BY RT PCR (HOSPITAL ORDER, PERFORMED IN ~~LOC~~ HOSPITAL LAB): SARS Coronavirus 2: NEGATIVE

## 2020-01-22 LAB — BASIC METABOLIC PANEL
BUN: 99 mg/dL — ABNORMAL HIGH (ref 6–20)
BUN: 102 mg/dL — ABNORMAL HIGH (ref 6–20)
CO2: 20 mmol/L — ABNORMAL LOW (ref 22–32)
CO2: 21 mmol/L — ABNORMAL LOW (ref 22–32)
Calcium: 10.3 mg/dL (ref 8.9–10.3)
Calcium: 11.7 mg/dL — ABNORMAL HIGH (ref 8.9–10.3)
Chloride: >130 mmol/L (ref 98–111)
Chloride: >130 mmol/L (ref 98–111)
Creatinine, Ser: 2.35 mg/dL — ABNORMAL HIGH (ref 0.44–1.00)
Creatinine, Ser: 2.45 mg/dL — ABNORMAL HIGH (ref 0.44–1.00)
GFR calc Af Amer: 29 mL/min — ABNORMAL LOW (ref >60)
GFR calc Af Amer: 28 mL/min — ABNORMAL LOW (ref >60)
GFR calc non Af Amer: 25 mL/min — ABNORMAL LOW (ref >60)
GFR calc non Af Amer: 24 mL/min — ABNORMAL LOW (ref >60)
Glucose, Bld: 120 mg/dL — ABNORMAL HIGH (ref 70–99)
Glucose, Bld: 131 mg/dL — ABNORMAL HIGH (ref 70–99)
Potassium: 3.5 mmol/L (ref 3.5–5.1)
Potassium: 3.6 mmol/L (ref 3.5–5.1)
Sodium: 166 mmol/L (ref 135–145)
Sodium: 167 mmol/L (ref 135–145)

## 2020-01-22 LAB — LACTIC ACID, PLASMA
Lactic Acid, Venous: 3.3 mmol/L (ref 0.5–1.9)
Lactic Acid, Venous: 2.4 mmol/L (ref 0.5–1.9)
Lactic Acid, Venous: 1.5 mmol/L (ref 0.5–1.9)

## 2020-01-22 LAB — URINALYSIS, ROUTINE W REFLEX MICROSCOPIC
Bilirubin Urine: NEGATIVE
Glucose, UA: NEGATIVE mg/dL
Ketones, ur: NEGATIVE mg/dL
Nitrite: POSITIVE — AB
Protein, ur: 30 mg/dL — AB
Specific Gravity, Urine: 1.02 (ref 1.005–1.030)
WBC, UA: >50 WBC/hpf — ABNORMAL HIGH (ref 0–5)
pH: 5 (ref 5.0–8.0)

## 2020-01-22 LAB — COMPREHENSIVE METABOLIC PANEL
ALT: 19 U/L (ref 0–44)
AST: 18 U/L (ref 15–41)
Albumin: 3 g/dL — ABNORMAL LOW (ref 3.5–5.0)
Alkaline Phosphatase: 109 U/L (ref 38–126)
Anion gap: 13 (ref 5–15)
BUN: 116 mg/dL — ABNORMAL HIGH (ref 6–20)
CO2: 23 mmol/L (ref 22–32)
Calcium: 12.7 mg/dL — ABNORMAL HIGH (ref 8.9–10.3)
Chloride: 129 mmol/L — ABNORMAL HIGH (ref 98–111)
Creatinine, Ser: 3.06 mg/dL — ABNORMAL HIGH (ref 0.44–1.00)
GFR calc Af Amer: 21 mL/min — ABNORMAL LOW (ref >60)
GFR calc non Af Amer: 18 mL/min — ABNORMAL LOW (ref >60)
Glucose, Bld: 199 mg/dL — ABNORMAL HIGH (ref 70–99)
Potassium: 4 mmol/L (ref 3.5–5.1)
Sodium: 165 mmol/L (ref 135–145)
Total Bilirubin: 1.4 mg/dL — ABNORMAL HIGH (ref 0.3–1.2)
Total Protein: 8.4 g/dL — ABNORMAL HIGH (ref 6.5–8.1)

## 2020-01-22 LAB — I-STAT BETA HCG BLOOD, ED (MC, WL, AP ONLY): I-stat hCG, quantitative: <5 m[IU]/mL (ref <5)

## 2020-01-22 LAB — PROTIME-INR
INR: 1.5 — ABNORMAL HIGH (ref 0.8–1.2)
Prothrombin Time: 17.5 seconds — ABNORMAL HIGH (ref 11.4–15.2)

## 2020-01-22 MED ORDER — VANCOMYCIN HCL 1250 MG/250ML IV SOLN
1250.0000 mg | Freq: Once | INTRAVENOUS | Status: AC
  Administered 2020-01-22: 1250 mg via INTRAVENOUS
  Filled 2020-01-22: qty 250

## 2020-01-22 MED ORDER — DEXTROSE 5 % IV SOLN: INTRAVENOUS | Status: DC

## 2020-01-22 MED ORDER — HEPARIN SODIUM (PORCINE) 5000 UNIT/ML IJ SOLN
5000.0000 [IU] | Freq: Three times a day (TID) | INTRAMUSCULAR | Status: DC
  Administered 2020-01-22 – 2020-01-25 (×8): 5000 [IU] via SUBCUTANEOUS
  Filled 2020-01-22 (×10): qty 1

## 2020-01-22 MED ORDER — VANCOMYCIN HCL IN DEXTROSE 1-5 GM/200ML-% IV SOLN: 1000.0000 mg | Freq: Once | INTRAVENOUS | Status: DC

## 2020-01-22 MED ORDER — SODIUM CHLORIDE 0.9 % IV BOLUS (SEPSIS)
1000.0000 mL | Freq: Once | INTRAVENOUS | Status: AC
  Administered 2020-01-22: 1000 mL via INTRAVENOUS

## 2020-01-22 MED ORDER — PIPERACILLIN-TAZOBACTAM 3.375 G IVPB
3.3750 g | Freq: Once | INTRAVENOUS | Status: AC
  Administered 2020-01-22: 3.375 g via INTRAVENOUS
  Filled 2020-01-22: qty 50

## 2020-01-22 MED ORDER — SODIUM CHLORIDE 0.9 % IV SOLN
1.0000 g | INTRAVENOUS | Status: DC
  Administered 2020-01-22 – 2020-01-27 (×5): 1 g via INTRAVENOUS
  Filled 2020-01-22 (×5): qty 10

## 2020-01-22 MED ORDER — SODIUM CHLORIDE 0.9 % IV BOLUS
1000.0000 mL | Freq: Once | INTRAVENOUS | Status: AC
  Administered 2020-01-22: 1000 mL via INTRAVENOUS

## 2020-01-22 NOTE — H&P (Signed)
History and Physical    Cyan Clippinger WUJ:[id number] DOB: 04/28/1981 DOA: 01/22/2020  PCP: Kerin Perna, NP  Patient coming from: Home, no family bedside  I have personally briefly reviewed patient's old medical records in [place]  Chief Complaint: Generalized weakness  HPI: Theresa Mccoy is a 39 y.o. 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  Patient did not answer any questions and did not provide any history.  Reportedly per ED documentation she presented here from home for generalized weakness and body aches for 1 week.  Patient has not been able to get out of bed and stopped talking and has had altered mental status.  In the ED,She was afebrile, tachycardic up to 140s and intermittently hypotension on room air.   Had leukocytosis of 24.7. Lactate of 3.3 Na of 165, chloride 129, glucose 199, creatinine 3.06, BUN 116, normal LFTs and total bilirubin of 1.4.  UA was positive for nitrate and large leukocytes and many bacteria.   CT abdomen was obtained since pt had hx of SBO s/p small bowel resection in 03/5620 with complications of intra-abdominal abscess but refused IR drainage. New CT did not show any abscess  but had non-obstructing 28mm right renal calculus.   There is a large 4.2 cm complex hyperdense lesion within the left adnexa that could representa a endometrioma/hemorrhagic cyst.   Review of Systems:  Unable to obtain full review of system as patient did not want to answer questions  Past Medical History:  Diagnosis Date  . Anemia   . Depression   . Hyperthyroidism   . PTSD (post-traumatic stress disorder)   . SBO (small bowel obstruction) ([place]) 07/07/2019  . Thyroid disease     Past Surgical History:  Procedure Laterality Date  . BOWEL RESECTION N/A 07/08/2019   Procedure: Small Bowel  Resection;  Surgeon: Clovis Riley, MD;  Location: [facility];  Service: General;  Laterality: N/A;  . CESAREAN SECTION    . DILATION AND CURETTAGE OF UTERUS    . HAND SURGERY Left   . LAPAROTOMY N/A 07/08/2019   Procedure: EXPLORATORY LAPAROTOMY;  Surgeon: Clovis Riley, MD;  Location: [facility];  Service: General;  Laterality: N/A;  . SALPINGECTOMY       reports that she has been smoking cigarettes. She has been smoking about 0.25 packs per day. She has never used smokeless tobacco. She reports previous drug use. Drug: Marijuana. She reports that she does not drink alcohol.  Allergies  Allergen Reactions  . Codeine Other (See Comments)    constipation      Prior to Admission medications   Medication Sig Start Date End Date Taking? Authorizing Provider  acetaminophen (TYLENOL) 325 MG tablet Take 2 tablets (650 mg total) by mouth every 6 (six) hours as needed for mild pain. 07/18/19   Meuth, Brooke A, PA-C  benztropine (COGENTIN) 0.5 MG tablet Take 1 tablet (0.5 mg total) by mouth 2 (two) times daily. 10/11/18   Johnn Hai, MD  Blood Pressure Monitoring (BLOOD PRESSURE MONITOR AUTOMAT) DEVI 1 Device by Does not apply route daily. Automatic blood pressure cuff with regular cuff. Blood pressure to be monitored regularly at home. ICD-10 code: O09.90. 05/17/19   Donnamae Jude, MD  gabapentin (NEURONTIN) 300 MG capsule Take 1 capsule (300 mg total) by mouth 3 (three) times daily. 10/11/18   Johnn Hai, MD  Multiple Vitamin (  MULTIVITAMIN WITH MINERALS) TABS tablet Take 1 tablet by mouth daily. 07/19/19   Meuth, Brooke A, PA-C  risperiDONE (RISPERDAL) 2 MG tablet 1 in am 2 at hs Patient taking differently: 2-4 mg 2 (two) times daily. 2mg  in am and 4mg  at hs 10/11/18   Johnn Hai, MD  risperiDONE microspheres (RISPERDAL CONSTA) 50 MG injection Inject 2 mLs (50 mg total) into the muscle every 21 ( twenty-one) days. Due 4/27 10/11/18   Johnn Hai, MD    Physical Exam: Vitals:   01/22/20 1730  01/22/20 1743 01/22/20 1829 01/22/20 1830  BP: (!) 111/95 106/81 117/88 (!) 112/97  Pulse: (!) 111 (!) 115 (!) 110 (!) 111  Resp: (!) 25 (!) 21 19 (!) 26  Temp:      TempSrc:      SpO2: 100% 100% 100% 100%    Constitutional: Disheveled appearing thin young female ill-appearing laying at 40 degree incline in bed Vitals:   01/22/20 1730 01/22/20 1743 01/22/20 1829 01/22/20 1830  BP: (!) 111/95 106/81 117/88 (!) 112/97  Pulse: (!) 111 (!) 115 (!) 110 (!) 111  Resp: (!) 25 (!) 21 19 (!) 26  Temp:      TempSrc:      SpO2: 100% 100% 100% 100%   Eyes: PERRL, lids and conjunctivae normal ENMT: Poor dentition with dry mucosa Neck: normal, supple Respiratory: clear to auscultation bilaterally, no wheezing, no crackles. Normal respiratory effort. No accessory muscle use.  Cardiovascular: Regular rate and rhythm, no murmurs / rubs / gallops. No extremity edema.  Abdomen: Patient grimaced throughout the entire abdominal exam without any rebound tenderness, guarding, rigidity, no masses palpated.  Bowel sounds positive.  Musculoskeletal: no clubbing / cyanosis. No joint deformity upper and lower extremities. Good ROM, no contractures. Normal muscle tone.  Skin: no rashes, lesions, ulcers. No induration Neurologic: Patient alert and was able to mumble yes when asked what her name was.  Other than that, she was reluctant to answer any questions.  However able to follow simple commands such as lift her lower extremities and to bilateral handgrip.  Psychiatric: Patient reluctant to speak.   Labs on Admission: I have personally reviewed following labs and imaging studies  CBC: Recent Labs  Lab 01/22/20 1409  WBC 24.7*  NEUTROABS 19.9*  HGB 14.9  HCT 49.0*  MCV 99.4  PLT 601   Basic Metabolic Panel: Recent Labs  Lab 01/22/20 1409  NA 165*  K 4.0  CL 129*  CO2 23  GLUCOSE 199*  BUN 116*  CREATININE 3.06*  CALCIUM 12.7*   GFR: CrCl cannot be calculated (Unknown ideal  weight.). Liver Function Tests: Recent Labs  Lab 01/22/20 1409  AST 18  ALT 19  ALKPHOS 109  BILITOT 1.4*  PROT 8.4*  ALBUMIN 3.0*   No results for input(s): LIPASE, AMYLASE in the last 168 hours. No results for input(s): AMMONIA in the last 168 hours. Coagulation Profile: Recent Labs  Lab 01/22/20 1409  INR 1.5*   Cardiac Enzymes: No results for input(s): CKTOTAL, CKMB, CKMBINDEX, TROPONINI in the last 168 hours. BNP (last 3 results) No results for input(s): PROBNP in the last 8760 hours. HbA1C: No results for input(s): HGBA1C in the last 72 hours. CBG: No results for input(s): GLUCAP in the last 168 hours. Lipid Profile: No results for input(s): CHOL, HDL, LDLCALC, TRIG, CHOLHDL, LDLDIRECT in the last 72 hours. Thyroid Function Tests: No results for input(s): TSH, T4TOTAL, FREET4, T3FREE, THYROIDAB in the last 72 hours. Anemia Panel:  No results for input(s): VITAMINB12, FOLATE, FERRITIN, TIBC, IRON, RETICCTPCT in the last 72 hours. Urine analysis:    Component Value Date/Time   COLORURINE AMBER (A) 01/22/2020 1634   APPEARANCEUR HAZY (A) 01/22/2020 1634   APPEARANCEUR Cloudy (A) 02/02/2017 1510   LABSPEC 1.020 01/22/2020 1634   PHURINE 5.0 01/22/2020 1634   GLUCOSEU NEGATIVE 01/22/2020 1634   HGBUR MODERATE (A) 01/22/2020 1634   BILIRUBINUR NEGATIVE 01/22/2020 1634   BILIRUBINUR Negative 02/02/2017 1510   KETONESUR NEGATIVE 01/22/2020 1634   PROTEINUR 30 (A) 01/22/2020 1634   UROBILINOGEN 0.2 03/09/2017 1756   NITRITE POSITIVE (A) 01/22/2020 1634   LEUKOCYTESUR LARGE (A) 01/22/2020 1634    Radiological Exams on Admission: CT ABDOMEN PELVIS WO CONTRAST  Result Date: 01/22/2020 CLINICAL DATA:  Change in mental status question of sepsis EXAM: CT ABDOMEN AND PELVIS WITHOUT CONTRAST TECHNIQUE: Multidetector CT imaging of the abdomen and pelvis was performed following the standard protocol without IV contrast. COMPARISON:  July 17, 2019 FINDINGS: Cardiovascular:  There are no significant vascular findings. The heart size is normal. There is no pericardial thickening or effusion. Mediastinum/Nodes: There are no enlarged mediastinal, hilar or axillary lymph nodes. The thyroid gland, trachea and esophagus demonstrate no significant findings. Lungs/Pleura: There are no areas consolidation. No suspicious pulmonary nodules. There is no pleural effusion. Upper abdomen: The visualized portion of the upper abdomen is unremarkable. Musculoskeletal/Chest wall: There is no chest wall mass or suspicious osseous finding. No acute osseous abnormality Abdomen/pelvis: Lower chest: The visualized heart size within normal limits. No pericardial fluid/thickening. No hiatal hernia. The visualized portions of the lungs are clear. Hepatobiliary: The liver is normal in density without focal abnormality.The main portal vein is patent. No evidence of calcified gallstones, gallbladder wall thickening or biliary dilatation. Pancreas: Unremarkable. No pancreatic ductal dilatation or surrounding inflammatory changes. Spleen: Normal in size without focal abnormality. Adrenals/Urinary Tract: Both adrenal glands appear normal. There is a 9 mm calculus seen in lower pole the right kidney. No collecting system calculi are seen. The bladder is unremarkable. The left kidney is unremarkable. Bladder is unremarkable. Stomach/Bowel: The stomach, small bowel, and colon are normal in appearance. No inflammatory changes, wall thickening, or obstructive findings.The patient is status post small bowel anastomosis in the quadrant. Vascular/Lymphatic: There are no enlarged mesenteric, retroperitoneal, or pelvic lymph nodes. No significant vascular findings are present. Reproductive: Thin the left adnexa there is a fluid and hyperdense probable blood containing lesion measuring 4.2 cm which could represent a large complex hemorrhagic cyst. The uterus is grossly unremarkable. Other: No evidence of abdominal wall mass or  hernia. Musculoskeletal: No acute or significant osseous findings. IMPRESSION: No acute intrathoracic pathology. Nonobstructing 9 mm right renal calculus. Large 4.2 cm complex hyperdense lesion within the left adnexa which could represent a endometrioma/hemorrhagic cyst. If further evaluation is required would recommend pelvic ultrasound. Electronically Signed   By: Prudencio Pair M.D.   On: 01/22/2020 17:03   DG Chest 1 View  Result Date: 01/22/2020 CLINICAL DATA:  Generalized weakness EXAM: CHEST  1 VIEW COMPARISON:  None. FINDINGS: The heart size and mediastinal contours are within normal limits. Both lungs are clear. The visualized skeletal structures are unremarkable. IMPRESSION: No active disease. Electronically Signed   By: Prudencio Pair M.D.   On: 01/22/2020 16:21   CT Head Wo Contrast  Result Date: 01/22/2020 CLINICAL DATA:  Generalized weakness for 1 week with change in mental status, non communicative. EXAM: CT HEAD WITHOUT CONTRAST TECHNIQUE: Contiguous axial images were  obtained from the base of the skull through the vertex without intravenous contrast. COMPARISON:  None. FINDINGS: Brain: No evidence of acute infarction, hemorrhage, hydrocephalus, extra-axial collection or mass lesion/mass effect. Vascular: No hyperdense vessel or unexpected calcification. Skull: No calvarial fracture or suspicious osseous lesion. No scalp swelling or hematoma. Sinuses/Orbits: Paranasal sinuses and mastoid air cells are predominantly clear. Pneumatization of the petrous apices and squamosal temporal bones. Included orbital structures are unremarkable. Other: None IMPRESSION: No acute intracranial findings. Electronically Signed   By: Lovena Le M.D.   On: 01/22/2020 15:28   CT CHEST WO CONTRAST  Result Date: 01/22/2020 CLINICAL DATA:  Change in mental status question of sepsis EXAM: CT ABDOMEN AND PELVIS WITHOUT CONTRAST TECHNIQUE: Multidetector CT imaging of the abdomen and pelvis was performed following the  standard protocol without IV contrast. COMPARISON:  July 17, 2019 FINDINGS: Cardiovascular: There are no significant vascular findings. The heart size is normal. There is no pericardial thickening or effusion. Mediastinum/Nodes: There are no enlarged mediastinal, hilar or axillary lymph nodes. The thyroid gland, trachea and esophagus demonstrate no significant findings. Lungs/Pleura: There are no areas consolidation. No suspicious pulmonary nodules. There is no pleural effusion. Upper abdomen: The visualized portion of the upper abdomen is unremarkable. Musculoskeletal/Chest wall: There is no chest wall mass or suspicious osseous finding. No acute osseous abnormality Abdomen/pelvis: Lower chest: The visualized heart size within normal limits. No pericardial fluid/thickening. No hiatal hernia. The visualized portions of the lungs are clear. Hepatobiliary: The liver is normal in density without focal abnormality.The main portal vein is patent. No evidence of calcified gallstones, gallbladder wall thickening or biliary dilatation. Pancreas: Unremarkable. No pancreatic ductal dilatation or surrounding inflammatory changes. Spleen: Normal in size without focal abnormality. Adrenals/Urinary Tract: Both adrenal glands appear normal. There is a 9 mm calculus seen in lower pole the right kidney. No collecting system calculi are seen. The bladder is unremarkable. The left kidney is unremarkable. Bladder is unremarkable. Stomach/Bowel: The stomach, small bowel, and colon are normal in appearance. No inflammatory changes, wall thickening, or obstructive findings.The patient is status post small bowel anastomosis in the quadrant. Vascular/Lymphatic: There are no enlarged mesenteric, retroperitoneal, or pelvic lymph nodes. No significant vascular findings are present. Reproductive: Thin the left adnexa there is a fluid and hyperdense probable blood containing lesion measuring 4.2 cm which could represent a large complex  hemorrhagic cyst. The uterus is grossly unremarkable. Other: No evidence of abdominal wall mass or hernia. Musculoskeletal: No acute or significant osseous findings. IMPRESSION: No acute intrathoracic pathology. Nonobstructing 9 mm right renal calculus. Large 4.2 cm complex hyperdense lesion within the left adnexa which could represent a endometrioma/hemorrhagic cyst. If further evaluation is required would recommend pelvic ultrasound. Electronically Signed   By: Prudencio Pair M.D.   On: 01/22/2020 17:03      Assessment/Plan  Sepsis secondary to UTI UA positive for nitrate and large leukocyte and many bacteria.  Urine culture pending. Received Zosyn in the ED and will switch to IV Rocephin in the morning Continue to trend lactate blood culture pending  Hypotension secondary to hypovolemia and sepsis Has received 2 L of IV normal saline fluid in the ED Continue D5 in the setting of hypernatremia  Hypernatremia Repeat stat BMP after received 2 L of IV NS in ED shows worsening of sodium Start continuous D5 fluid and repeat BMP q4hrs  AKI  Pre-renal due to hypovolemia, hypotension and sepsis Avoid nephrotoxic agent  Nonobstructing right renal calculus 9 mm Conservative treatment and  pain management as needed Consider adding Flomax once her blood pressure normalizes  Incidental finding of complex left adnexa lesion Will need pelvic ultrasound outpatient follow up  Bipolar disorder/anxiety Suspect there is a component of psych overlay with patient being reluctant did not answer any questions during admission Continue home meds   DVT prophylaxis: Heparin subcu Code Status: Full Family Communication: No family at bedside  disposition Plan: Home with at least 2 midnight stays  Consults called:  Admission status: inpatient Status is: Inpatient  Remains inpatient appropriate because:Inpatient level of care appropriate due to severity of illness   Dispo: The patient is from:  Home              Anticipated d/c is to: Home              Anticipated d/c date is: 3 days              Patient currently is not medically stable to d/c.         Orene Desanctis DO [facility]   If 7PM-7AM, please contact night-coverage [email or web address]   01/22/2020, 7:00 PM

## 2020-01-22 NOTE — ED Notes (Signed)
Pt BP decreased to 88/75, normal saline bolus started

## 2020-01-22 NOTE — ED Triage Notes (Signed)
Pt here from home for generalized weakness and body aches x1 week, today pt was not able to get out of bed, stopped talking. Has had change in mental status per Ems. Pt is alert to pain but not speaking. Ems gave 500 cc of NS.

## 2020-01-22 NOTE — ED Provider Notes (Signed)
[facility] DEPART[facility] Provider Note   CSN: [id number] Arrival date & time: 01/22/20  1352     History Chief Complaint  Patient presents with  . Weakness  . Altered Mental Status    Theresa Mccoy is a 39 y.o. female.  Patient is a 39 year old female with past medical history of bipolar disorder, PTSD, small bowel obstruction, depression, and anemia.  She is brought from home today for evaluation of altered mental status.  Patient has had generalized weakness and body aches for the past week.  Today she stopped talking and was not able to get out of bed.  Patient was found to be tachycardic and IV fluids initiated.  Patient has little additional history secondary to severity of condition.  Upon reviewing the record, it appears as though patient was admitted for a small bowel obstruction in January 2021.  She was found to have multiple intra-abdominal abscesses for which IR drainage was recommended, but patient refused.  She was discharged on oral antibiotics.  The history is provided by the patient and the EMS personnel.       Past Medical History:  Diagnosis Date  . Anemia   . Depression   . Hyperthyroidism   . PTSD (post-traumatic stress disorder)   . SBO (small bowel obstruction) ([place]) 07/07/2019  . Thyroid disease     Patient Active Problem List   Diagnosis Date Noted  . Hypercalcemia 07/07/2019  . Primary hyperparathyroidism ([place]) 07/07/2019  . Urinary tract infection 07/02/2019  . SBO (small bowel obstruction) ([place]) 07/02/2019  . Supervision of high risk pregnancy, antepartum 05/17/2019  . Muscle cramps 02/02/2017  . Bipolar disorder, current episode mixed, moderate ([place]) 02/02/2017  . Generalized abdominal pain 02/02/2017  . Generalized anxiety disorder 02/02/2017  . Essential hypertension 02/02/2017    Past Surgical History:  Procedure Laterality Date  . BOWEL RESECTION N/A 07/08/2019   Procedure: Small Bowel Resection;  Surgeon:  Clovis Riley, MD;  Location: [facility];  Service: General;  Laterality: N/A;  . CESAREAN SECTION    . DILATION AND CURETTAGE OF UTERUS    . HAND SURGERY Left   . LAPAROTOMY N/A 07/08/2019   Procedure: EXPLORATORY LAPAROTOMY;  Surgeon: Clovis Riley, MD;  Location: [facility] OR;  Service: General;  Laterality: N/A;  . SALPINGECTOMY       OB History    Gravida  5   Para  2   Term  2   Preterm      AB  2   Living  2     SAB  2   TAB      Ectopic  0   Multiple      Live Births  2           No family history on file.  Social History   Tobacco Use  . Smoking status: Current Every Day Smoker    Packs/day: 0.25    Types: Cigarettes  . Smokeless tobacco: Never Used  Vaping Use  . Vaping Use: Never used  Substance Use Topics  . 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 Drug use: Not Currently    Types: Marijuana    Home Medications Prior to Admission medications   Medication Sig Start Date End Date Taking? Authorizing Provider  acetaminophen (TYLENOL) 325 MG tablet Take 2 tablets (650 mg total) by mouth every 6 (six) hours as needed for mild pain. 07/18/19   Meuth, Blaine Hamper, PA-C  benztropine (COGENTIN) 0.5 MG tablet Take 1 tablet (0.5 mg total) by mouth 2 (two) times daily. 10/11/18   Johnn Hai, MD  Blood Pressure Monitoring (BLOOD PRESSURE MONITOR AUTOMAT) DEVI 1 Device by Does not apply route daily. Automatic blood pressure cuff with regular cuff. Blood pressure to be monitored regularly at home. ICD-10 code: O09.90. 05/17/19   Donnamae Jude, MD  gabapentin (NEURONTIN) 300 MG capsule Take 1 capsule (300 mg total) by mouth 3 (three) times daily. 10/11/18   Johnn Hai, MD  Multiple Vitamin (MULTIVITAMIN WITH MINERALS) TABS tablet Take 1 tablet by mouth daily. 07/19/19   Meuth, Brooke A, PA-C  risperiDONE (RISPERDAL) 2 MG tablet 1 in am 2 at hs Patient taking differently: 2-4 mg 2 (two) times daily. 2mg  in am and 4mg  at hs 10/11/18   Johnn Hai, MD    risperiDONE microspheres (RISPERDAL CONSTA) 50 MG injection Inject 2 mLs (50 mg total) into the muscle every 21 ( twenty-one) days. Due 4/27 10/11/18   Johnn Hai, MD    Allergies    Codeine  Review of Systems   Review of Systems  Unable to perform ROS: Mental status change    Physical Exam Updated Vital Signs BP (!) 120/97 (BP Location: Right Arm)   Pulse (!) 133   Temp 98.8 F (37.1 C) (Rectal)   Resp (!) 24   SpO2 98%   Physical Exam Vitals and nursing note reviewed.  Constitutional:      General: She is not in acute distress.    Appearance: She is well-developed. She is not diaphoretic.     Comments: Patient is somnolent, but arousable.  She is not responding to commands, but does make eye contact.  HENT:     Head: Normocephalic and atraumatic.     Mouth/Throat:     Mouth: Mucous membranes are dry.     Comments: Mucous membranes are dry with poor oral hygiene. Cardiovascular:     Rate and Rhythm: Regular rhythm. Tachycardia present.     Heart sounds: No murmur heard.  No friction rub. No gallop.   Pulmonary:     Effort: Pulmonary effort is normal. No respiratory distress.     Breath sounds: Normal breath sounds. No wheezing.  Abdominal:     General: Bowel sounds are normal. There is no distension.     Palpations: Abdomen is soft.     Tenderness: There is no abdominal tenderness.  Musculoskeletal:        General: Normal range of motion.     Cervical back: Normal range of motion and neck supple.  Skin:    General: Skin is warm and dry.  Neurological:     Comments: Patient is somnolent.  She is arousable and makes eye contact, but does not follow commands.     ED Results / Procedures / Treatments   Labs (all labs ordered are listed, but only abnormal results are displayed) Labs Reviewed  CBC WITH DIFFERENTIAL/PLATELET - Abnormal; Notable for the following components:      Result Value   WBC 24.7 (*)    HCT 49.0 (*)    Neutro Abs 19.9 (*)    Monocytes  Absolute 1.4 (*)    Abs Immature Granulocytes 0.26 (*)    All other components within normal limits  CULTURE, BLOOD (ROUTINE X 2)  CULTURE, BLOOD (ROUTINE X 2)  COMPREHENSIVE METABOLIC PANEL  LACTIC ACID, PLASMA  LACTIC ACID, PLASMA  PROTIME-INR  URINALYSIS, ROUTINE W REFLEX MICROSCOPIC  I-STAT BETA  HCG BLOOD, ED ([facility], [facility], [facility] ONLY)    EKG EKG Interpretation  Date/Time:  Sunday January 22 2020 13:53:22 EDT Ventricular Rate:  136 PR Interval:    QRS Duration: 79 QT Interval:  301 QTC Calculation: 453 R Axis:   75 Text Interpretation: Sinus tachycardia LAE, consider biatrial enlargement Minimal ST depression, inferior leads No significant change since 07/03/2019 Confirmed by Veryl Speak [phone]) on 01/22/2020 1:56:17 PM   Radiology No results found.  Procedures Procedures (including critical care time)  Medications Ordered in ED Medications - No data to display  ED Course  I have reviewed the triage vital signs and the nursing notes.  Pertinent labs & imaging results that were available during my care of the patient were reviewed by me and considered in my medical decision making (see chart for details).    MDM Rules/Calculators/A&P  Patient is a 39 year old female with history of bipolar presenting for evaluation of altered mental status.  From what EMS tells me, the patient has become more lethargic at home over the past week, then stopped speaking and responding today.  When the patient arrived here, she appeared acutely ill and nonverbal.  She appeared extremely dehydrated.  Due to her tachycardia and episode of hypotension, a code sepsis was initiated.  Cultures were obtained of the blood and urine as well as chest x-ray.  Sepsis fluids initiated.  Patient given broad-spectrum antibiotics for these reasons and WBC count of nearly 25,000.  Patient's work-up shows a sodium of 165, BUN of 116 with creatinine of 3.1 reflective of severe dehydration.  Patient given sepsis  quantity fluids and is now becoming much more alert.  Care originally discussed with critical care.  They have evaluated and feel as though the patient is improving and a candidate for stepdown.  I have spoken with the hospitalist who agrees to admit.  CRITICAL CARE Performed by: Veryl Speak Total critical care time: 45 minutes Critical care time was exclusive of separately billable procedures and treating other patients. Critical care was necessary to treat or prevent imminent or life-threatening deterioration. Critical care was time spent personally by me on the following activities: development of treatment plan with patient and/or surrogate as well as nursing, discussions with consultants, evaluation of patient's response to treatment, examination of patient, obtaining history from patient or surrogate, ordering and performing treatments and interventions, ordering and review of laboratory studies, ordering and review of radiographic studies, pulse oximetry and re-evaluation of patient's condition.   Final Clinical Impression(s) / ED Diagnoses Final diagnoses:  None    Rx / DC Orders ED Discharge Orders    None       Veryl Speak, MD 01/22/20 1858

## 2020-01-23 ENCOUNTER — Inpatient Hospital Stay (HOSPITAL_COMMUNITY): Payer: Medicaid Other

## 2020-01-23 LAB — CBC
HCT: 36.8 % (ref 36.0–46.0)
Hemoglobin: 11.3 g/dL — ABNORMAL LOW (ref 12.0–15.0)
MCH: 31 pg (ref 26.0–34.0)
MCHC: 30.7 g/dL (ref 30.0–36.0)
MCV: 101.1 fL — ABNORMAL HIGH (ref 80.0–100.0)
Platelets: 244 10*3/uL (ref 150–400)
RBC: 3.64 MIL/uL — ABNORMAL LOW (ref 3.87–5.11)
RDW: 14.2 % (ref 11.5–15.5)
WBC: 24.7 10*3/uL — ABNORMAL HIGH (ref 4.0–10.5)
nRBC: 0.1 % (ref 0.0–0.2)

## 2020-01-23 LAB — BASIC METABOLIC PANEL
BUN: 85 mg/dL — ABNORMAL HIGH (ref 6–20)
BUN: 83 mg/dL — ABNORMAL HIGH (ref 6–20)
BUN: 76 mg/dL — ABNORMAL HIGH (ref 6–20)
BUN: 63 mg/dL — ABNORMAL HIGH (ref 6–20)
CO2: 21 mmol/L — ABNORMAL LOW (ref 22–32)
CO2: 22 mmol/L (ref 22–32)
CO2: 20 mmol/L — ABNORMAL LOW (ref 22–32)
CO2: 21 mmol/L — ABNORMAL LOW (ref 22–32)
Calcium: 12.1 mg/dL — ABNORMAL HIGH (ref 8.9–10.3)
Calcium: 12.2 mg/dL — ABNORMAL HIGH (ref 8.9–10.3)
Calcium: 12.3 mg/dL — ABNORMAL HIGH (ref 8.9–10.3)
Calcium: 11.1 mg/dL — ABNORMAL HIGH (ref 8.9–10.3)
Chloride: >130 mmol/L (ref 98–111)
Chloride: >130 mmol/L (ref 98–111)
Chloride: >130 mmol/L (ref 98–111)
Chloride: >130 mmol/L (ref 98–111)
Creatinine, Ser: 2.2 mg/dL — ABNORMAL HIGH (ref 0.44–1.00)
Creatinine, Ser: 2.27 mg/dL — ABNORMAL HIGH (ref 0.44–1.00)
Creatinine, Ser: 2.14 mg/dL — ABNORMAL HIGH (ref 0.44–1.00)
Creatinine, Ser: 1.9 mg/dL — ABNORMAL HIGH (ref 0.44–1.00)
GFR calc Af Amer: 32 mL/min — ABNORMAL LOW (ref >60)
GFR calc Af Amer: 31 mL/min — ABNORMAL LOW (ref >60)
GFR calc Af Amer: 33 mL/min — ABNORMAL LOW (ref >60)
GFR calc Af Amer: 38 mL/min — ABNORMAL LOW (ref >60)
GFR calc non Af Amer: 27 mL/min — ABNORMAL LOW (ref >60)
GFR calc non Af Amer: 26 mL/min — ABNORMAL LOW (ref >60)
GFR calc non Af Amer: 28 mL/min — ABNORMAL LOW (ref >60)
GFR calc non Af Amer: 33 mL/min — ABNORMAL LOW (ref >60)
Glucose, Bld: 179 mg/dL — ABNORMAL HIGH (ref 70–99)
Glucose, Bld: 138 mg/dL — ABNORMAL HIGH (ref 70–99)
Glucose, Bld: 132 mg/dL — ABNORMAL HIGH (ref 70–99)
Glucose, Bld: 172 mg/dL — ABNORMAL HIGH (ref 70–99)
Potassium: 3.6 mmol/L (ref 3.5–5.1)
Potassium: 3.8 mmol/L (ref 3.5–5.1)
Potassium: 3.8 mmol/L (ref 3.5–5.1)
Potassium: 3.8 mmol/L (ref 3.5–5.1)
Sodium: 164 mmol/L (ref 135–145)
Sodium: 166 mmol/L (ref 135–145)
Sodium: 168 mmol/L (ref 135–145)
Sodium: 161 mmol/L (ref 135–145)

## 2020-01-23 LAB — BLOOD CULTURE ID PANEL (REFLEXED)

## 2020-01-23 MED ORDER — CALCITONIN (SALMON) 200 UNIT/ML IJ SOLN
4.0000 [IU]/kg | Freq: Two times a day (BID) | INTRAMUSCULAR | Status: DC
  Administered 2020-01-23 (×2): 244 [IU] via SUBCUTANEOUS
  Filled 2020-01-23 (×3): qty 1.22

## 2020-01-23 MED ORDER — POTASSIUM CHLORIDE IN NACL 20-0.45 MEQ/L-% IV SOLN
INTRAVENOUS | Status: DC
  Filled 2020-01-23 (×3): qty 1000

## 2020-01-23 NOTE — Progress Notes (Signed)
CRITICAL VALUE ALERT  Critical Value:  Na 161  Date & Time Notied:  01/23/2020 1750  Provider Notified: Dr. Carles Collet notified 1752  Orders Received/Actions taken: no New orders at this time

## 2020-01-23 NOTE — Progress Notes (Signed)
Theresa Mccoy is a 39 y.o. female patient admitted from ED awake, alert - oriented  X 4 - no acute distress noted.  VSS - Blood pressure 115/77, pulse 98, temperature 97.9 F (36.6 C), temperature source Oral, resp. rate 18, height 5' (1.524 m), weight 51.6 kg, SpO2 100 %.    IV in place, occlusive dsg intact without redness.  Orientation to room, and floor completed with information packet given to patient/family.  Patient declined safety video at this time.  Admission INP armband ID verified with patient/family, and in place.   SR up x 2, fall assessment complete, with patient and family able to verbalize understanding of risk associated with falls, and verbalized understanding to call nsg before up out of bed.  Call light within reach, patient able to voice, and demonstrate understanding.  Skin, clean-dry- intact without evidence of bruising, or skin tears.   No evidence of skin break down noted on exam.     Will cont to eval and treat per MD orders.  Luci Bank, RN 01/23/2020 5:54 PM

## 2020-01-23 NOTE — ED Notes (Signed)
Phlebotomy attempted to draw morning labs and stated they will attempt again.

## 2020-01-23 NOTE — Progress Notes (Signed)
PHARMACY - PHYSICIAN COMMUNICATION CRITICAL VALUE ALERT - BLOOD CULTURE IDENTIFICATION (BCID)  Theresa Mccoy is an 39 y.o. female who presented to [facility] on 01/22/2020 with a chief complaint of weakness  Assessment: One blood culture positive for staph species.  Could be contamination.  On ceftriaxone empirically for UTI  Name of physician (or Provider) Contacted: Dr. Carles Collet paged Current antibiotics: ceftriaxone  Changes to prescribed antibiotics recommended: none   Results for orders placed or performed during the hospital encounter of 01/22/20  Blood Culture ID Panel (Reflexed) (Collected: 01/22/2020  2:24 PM)  Result Value Ref Range   Enterococcus species NOT DETECTED NOT DETECTED   Listeria monocytogenes NOT DETECTED NOT DETECTED   Staphylococcus species DETECTED (A) NOT DETECTED   Staphylococcus aureus (BCID) NOT DETECTED NOT DETECTED   Methicillin resistance NOT DETECTED NOT DETECTED   Streptococcus species NOT DETECTED NOT DETECTED   Streptococcus agalactiae NOT DETECTED NOT DETECTED   Streptococcus pneumoniae NOT DETECTED NOT DETECTED   Streptococcus pyogenes NOT DETECTED NOT DETECTED   Acinetobacter baumannii NOT DETECTED NOT DETECTED   Enterobacteriaceae species NOT DETECTED NOT DETECTED   Enterobacter cloacae complex NOT DETECTED NOT DETECTED   Escherichia coli NOT DETECTED NOT DETECTED   Klebsiella oxytoca NOT DETECTED NOT DETECTED   Klebsiella pneumoniae NOT DETECTED NOT DETECTED   Proteus species NOT DETECTED NOT DETECTED   Serratia marcescens NOT DETECTED NOT DETECTED   Haemophilus influenzae NOT DETECTED NOT DETECTED   Neisseria meningitidis NOT DETECTED NOT DETECTED   Pseudomonas aeruginosa NOT DETECTED NOT DETECTED   Candida albicans NOT DETECTED NOT DETECTED   Candida glabrata NOT DETECTED NOT DETECTED   Candida krusei NOT DETECTED NOT DETECTED   Candida parapsilosis NOT DETECTED NOT DETECTED   Candida tropicalis NOT DETECTED NOT DETECTED    Candie Mile 01/23/2020  10:38 AM

## 2020-01-23 NOTE — ED Notes (Signed)
Lunch Trays Ordered @ 1101. 

## 2020-01-23 NOTE — Progress Notes (Signed)
EEG complete - results pending 

## 2020-01-23 NOTE — Progress Notes (Signed)
CRITICAL VALUE ALERT  Critical Value:  Chloride >130  Date & Time Notied:  01/23/2020 1750  Provider Notified: Dr. Carles Collet notified 1752  Orders Received/Actions taken: no new orders at this time

## 2020-01-23 NOTE — Progress Notes (Signed)
PROGRESS NOTE  Theresa Mccoy JYN:[id number] DOB: [birth year] DOA: 01/22/2020 PCP: Kerin Perna, NP  Brief History:   39 y.o. 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 Patient is encephalopathic at time of my evaluation.  History is obtained primarily from review of the medical record.  She is awake and alert and intermittently follows commands, but mumbles incoherently.  Intermittently, she does complain of pain all over.  Otherwise, review of systems is difficult to obtain and not obtainable.   Reportedly per ED documentation she presented here from home for generalized weakness and body aches for 1 week.  Patient has not been able to get out of bed and stopped talking and has had altered mental status.  In the ED,She was afebrile, tachycardic up to 140s and intermittently hypotension on room air.   Had leukocytosis of 24.7. Lactate of 3.3 Na of 165, chloride 129, glucose 199, creatinine 3.06, BUN 116, normal LFTs and total bilirubin of 1.4.  UA was positive for nitrate and large leukocytes and many bacteria.   CT abdomen was obtained since pt had hx of SBO s/p small bowel resection in 02/6577 with complications of intra-abdominal abscess but refused IR drainage. New CT did not show any abscess  but had non-obstructing 44mm right renal calculus.   There is a large 4.2 cm complex hyperdense lesion within the left adnexa that could representa a endometrioma/hemorrhagic cyst.    Assessment/Plan: Acute metabolic encephalopathy -Secondary to hypercalcemia and hyponatremia, AKI and sepsis/UTI -Continue IV fluids  Pyuria -UA>50 WBC -Unfortunately, urine culture was not obtained -Continue empiric ceftriaxone (started 01/22/2020)  Sepsis -present on admission -continue IVF -follow cultures -lactic peaked  3.3 -continue ceftriaxone  Hypercalcemia -Secondary to primary hyperparathyroidism -Corrected calcium 13.5 -Continue IV fluids -Start calcitonin [place] -Hesitate to use bisphosphonates presently given acute kidney injury -Reassess for bisphosphonate once renal function improves -09/29/2019 intact PTH 109 -09/26/2019 TSH 0.522  Hypernatremia -Start half-normal saline  AKI -baseline creatinine 0.7-0.9  Bipolar disorder -Holding Risperdal and benztropine secondary to altered mental status presently     Status is: Inpatient  Remains inpatient appropriate because:Persistent severe electrolyte disturbances   Dispo: The patient is from: Home              Anticipated d/c is to: Home              Anticipated d/c date is: 3 days              Patient currently is not medically stable to d/c.        Family Communication: no  Family at bedside  Consultants:  none  Code Status:  FULL  DVT Prophylaxis:  [place] Heparin    Procedures: As Listed in Progress Note Above  Antibiotics: None      Subjective: Review of systems unobtainable secondary to encephalopathy.  The patient states that she has pain all over.  Otherwise, she is not verbalizing any other issues  Objective: Vitals:   01/23/20 0330 01/23/20 0445 01/23/20 0625 01/23/20 0645  BP: 105/87 105/71 109/81 (!) 125/98  Pulse: (!) 119 (!) 121 (!) 119 (!) 118  Resp: (!) 22 (!) 23 (!) 24 (!) 22  Temp:      TempSrc:      SpO2: 100% 100% 100% 100%    Intake/Output Summary (Last 24 hours) at 01/23/2020  [place] filed at 01/22/2020 2023 Gross per 24 hour  Intake 2850 ml  Output --  Net 2850 ml   Weight change:  Exam:   General:  Pt is alert, follows commands appropriately, not in acute distress  HEENT: No icterus, No thrush, No neck mass, [place]/AT  Cardiovascular: RRR, S1/S2, no rubs, no gallops  Respiratory: Bibasilar crackles.  No wheeze  Abdomen: Soft/+BS, non tender, non distended, no  guarding  Extremities: No edema, No lymphangitis, No petechiae, No rashes, no synovitis   Data Reviewed: I have personally reviewed following labs and imaging studies Basic Metabolic Panel: Recent Labs  Lab 01/22/20 1409 01/22/20 1859 01/22/20 2125  NA 165* 166* 167*  K 4.0 3.5 3.6  CL 129* >130* >130*  CO2 23 20* 21*  GLUCOSE 199* 120* 131*  BUN 116* 99* 102*  CREATININE 3.06* 2.35* 2.45*  CALCIUM 12.7* 10.3 11.7*   Liver Function Tests: Recent Labs  Lab 01/22/20 1409  AST 18  ALT 19  ALKPHOS 109  BILITOT 1.4*  PROT 8.4*  ALBUMIN 3.0*   No results for input(s): LIPASE, AMYLASE in the last 168 hours. No results for input(s): AMMONIA in the last 168 hours. Coagulation Profile: Recent Labs  Lab 01/22/20 1409  INR 1.5*   CBC: Recent Labs  Lab 01/22/20 1409 01/23/20 0426  WBC 24.7* 24.7*  NEUTROABS 19.9*  --   HGB 14.9 11.3*  HCT 49.0* 36.8  MCV 99.4 101.1*  PLT 295 244   Cardiac Enzymes: No results for input(s): CKTOTAL, CKMB, CKMBINDEX, TROPONINI in the last 168 hours. BNP: Invalid input(s): POCBNP CBG: No results for input(s): GLUCAP in the last 168 hours. HbA1C: No results for input(s): HGBA1C in the last 72 hours. Urine analysis:    Component Value Date/Time   COLORURINE AMBER (A) 01/22/2020 1634   APPEARANCEUR HAZY (A) 01/22/2020 1634   APPEARANCEUR Cloudy (A) 02/02/2017 1510   LABSPEC 1.020 01/22/2020 1634   PHURINE 5.0 01/22/2020 1634   GLUCOSEU NEGATIVE 01/22/2020 1634   HGBUR MODERATE (A) 01/22/2020 1634   BILIRUBINUR NEGATIVE 01/22/2020 1634   BILIRUBINUR Negative 02/02/2017 1510   KETONESUR NEGATIVE 01/22/2020 1634   PROTEINUR 30 (A) 01/22/2020 1634   UROBILINOGEN 0.2 03/09/2017 1756   NITRITE POSITIVE (A) 01/22/2020 1634   LEUKOCYTESUR LARGE (A) 01/22/2020 1634   Sepsis Labs: @LABRCNTIP (procalcitonin:4,lacticidven:4) ) Recent Results (from the past 240 hour(s))  SARS Coronavirus 2 by RT PCR (hospital order, performed in [facility] hospital lab) Nasopharyngeal Nasopharyngeal Swab     Status: None   Collection Time: 01/22/20  4:35 PM   Specimen: Nasopharyngeal Swab  Result Value Ref Range Status   SARS Coronavirus 2 NEGATIVE NEGATIVE Final    Comment: (NOTE) SARS-CoV-2 target nucleic acids are NOT DETECTED.  The SARS-CoV-2 RNA is generally detectable in upper and lower respiratory specimens during the acute phase of infection. The lowest concentration of SARS-CoV-2 viral copies this assay can detect is 250 copies / mL. A negative result does not preclude SARS-CoV-2 infection and should not be used as the sole basis for treatment or other patient management decisions.  A negative result may occur with improper specimen collection / handling, submission of specimen other than nasopharyngeal swab, presence of viral mutation(s) within the areas targeted by this assay, and inadequate number of viral copies (<250 copies / mL). A negative result must be combined with clinical observations, patient history, and epidemiological information.  Fact Sheet for Patients:   StrictlyIdeas.no  Fact Sheet for Healthcare  Providers: BankingDealers.co.za  This test is not yet approved or  cleared by the [place] and has been authorized for detection and/or diagnosis of SARS-CoV-2 by FDA under an Emergency Use Authorization (EUA).  This EUA will remain in effect (meaning this test can be used) for the duration of the COVID-19 declaration under Section 564(b)(1) of the Act, 21 U.S.C. section 360bbb-3(b)(1), unless the authorization is terminated or revoked sooner.  Performed at [facility], [place] [address] [place], [place] [address]      Scheduled Meds: . heparin  5,000 Units Subcutaneous Q8H   Continuous Infusions: . cefTRIAXone (ROCEPHIN)  IV Stopped (01/22/20 2023)  . dextrose Stopped (01/23/20 7001)    Procedures/Studies: CT ABDOMEN PELVIS WO  CONTRAST  Result Date: 01/22/2020 CLINICAL DATA:  Change in mental status question of sepsis EXAM: CT ABDOMEN AND PELVIS WITHOUT CONTRAST TECHNIQUE: Multidetector CT imaging of the abdomen and pelvis was performed following the standard protocol without IV contrast. COMPARISON:  July 17, 2019 FINDINGS: Cardiovascular: There are no significant vascular findings. The heart size is normal. There is no pericardial thickening or effusion. Mediastinum/Nodes: There are no enlarged mediastinal, hilar or axillary lymph nodes. The thyroid gland, trachea and esophagus demonstrate no significant findings. Lungs/Pleura: There are no areas consolidation. No suspicious pulmonary nodules. There is no pleural effusion. Upper abdomen: The visualized portion of the upper abdomen is unremarkable. Musculoskeletal/Chest wall: There is no chest wall mass or suspicious osseous finding. No acute osseous abnormality Abdomen/pelvis: Lower chest: The visualized heart size within normal limits. No pericardial fluid/thickening. No hiatal hernia. The visualized portions of the lungs are clear. Hepatobiliary: The liver is normal in density without focal abnormality.The main portal vein is patent. No evidence of calcified gallstones, gallbladder wall thickening or biliary dilatation. Pancreas: Unremarkable. No pancreatic ductal dilatation or surrounding inflammatory changes. Spleen: Normal in size without focal abnormality. Adrenals/Urinary Tract: Both adrenal glands appear normal. There is a 9 mm calculus seen in lower pole the right kidney. No collecting system calculi are seen. The bladder is unremarkable. The left kidney is unremarkable. Bladder is unremarkable. Stomach/Bowel: The stomach, small bowel, and colon are normal in appearance. No inflammatory changes, wall thickening, or obstructive findings.The patient is status post small bowel anastomosis in the quadrant. Vascular/Lymphatic: There are no enlarged mesenteric, retroperitoneal,  or pelvic lymph nodes. No significant vascular findings are present. Reproductive: Thin the left adnexa there is a fluid and hyperdense probable blood containing lesion measuring 4.2 cm which could represent a large complex hemorrhagic cyst. The uterus is grossly unremarkable. Other: No evidence of abdominal wall mass or hernia. Musculoskeletal: No acute or significant osseous findings. IMPRESSION: No acute intrathoracic pathology. Nonobstructing 9 mm right renal calculus. Large 4.2 cm complex hyperdense lesion within the left adnexa which could represent a endometrioma/hemorrhagic cyst. If further evaluation is required would recommend pelvic ultrasound. Electronically Signed   By: Prudencio Pair M.D.   On: 01/22/2020 17:03   DG Chest 1 View  Result Date: 01/22/2020 CLINICAL DATA:  Generalized weakness EXAM: CHEST  1 VIEW COMPARISON:  None. FINDINGS: The heart size and mediastinal contours are within normal limits. Both lungs are clear. The visualized skeletal structures are unremarkable. IMPRESSION: No active disease. Electronically Signed   By: Prudencio Pair M.D.   On: 01/22/2020 16:21   CT Head Wo Contrast  Result Date: 01/22/2020 CLINICAL DATA:  Generalized weakness for 1 week with change in mental status, non communicative. EXAM: CT HEAD WITHOUT CONTRAST TECHNIQUE: Contiguous axial  images were obtained from the base of the skull through the vertex without intravenous contrast. COMPARISON:  None. FINDINGS: Brain: No evidence of acute infarction, hemorrhage, hydrocephalus, extra-axial collection or mass lesion/mass effect. Vascular: No hyperdense vessel or unexpected calcification. Skull: No calvarial fracture or suspicious osseous lesion. No scalp swelling or hematoma. Sinuses/Orbits: Paranasal sinuses and mastoid air cells are predominantly clear. Pneumatization of the petrous apices and squamosal temporal bones. Included orbital structures are unremarkable. Other: None IMPRESSION: No acute intracranial  findings. Electronically Signed   By: Lovena Le M.D.   On: 01/22/2020 15:28   CT CHEST WO CONTRAST  Result Date: 01/22/2020 CLINICAL DATA:  Change in mental status question of sepsis EXAM: CT ABDOMEN AND PELVIS WITHOUT CONTRAST TECHNIQUE: Multidetector CT imaging of the abdomen and pelvis was performed following the standard protocol without IV contrast. COMPARISON:  July 17, 2019 FINDINGS: Cardiovascular: There are no significant vascular findings. The heart size is normal. There is no pericardial thickening or effusion. Mediastinum/Nodes: There are no enlarged mediastinal, hilar or axillary lymph nodes. The thyroid gland, trachea and esophagus demonstrate no significant findings. Lungs/Pleura: There are no areas consolidation. No suspicious pulmonary nodules. There is no pleural effusion. Upper abdomen: The visualized portion of the upper abdomen is unremarkable. Musculoskeletal/Chest wall: There is no chest wall mass or suspicious osseous finding. No acute osseous abnormality Abdomen/pelvis: Lower chest: The visualized heart size within normal limits. No pericardial fluid/thickening. No hiatal hernia. The visualized portions of the lungs are clear. Hepatobiliary: The liver is normal in density without focal abnormality.The main portal vein is patent. No evidence of calcified gallstones, gallbladder wall thickening or biliary dilatation. Pancreas: Unremarkable. No pancreatic ductal dilatation or surrounding inflammatory changes. Spleen: Normal in size without focal abnormality. Adrenals/Urinary Tract: Both adrenal glands appear normal. There is a 9 mm calculus seen in lower pole the right kidney. No collecting system calculi are seen. The bladder is unremarkable. The left kidney is unremarkable. Bladder is unremarkable. Stomach/Bowel: The stomach, small bowel, and colon are normal in appearance. No inflammatory changes, wall thickening, or obstructive findings.The patient is status post small bowel  anastomosis in the quadrant. Vascular/Lymphatic: There are no enlarged mesenteric, retroperitoneal, or pelvic lymph nodes. No significant vascular findings are present. Reproductive: Thin the left adnexa there is a fluid and hyperdense probable blood containing lesion measuring 4.2 cm which could represent a large complex hemorrhagic cyst. The uterus is grossly unremarkable. Other: No evidence of abdominal wall mass or hernia. Musculoskeletal: No acute or significant osseous findings. IMPRESSION: No acute intrathoracic pathology. Nonobstructing 9 mm right renal calculus. Large 4.2 cm complex hyperdense lesion within the left adnexa which could represent a endometrioma/hemorrhagic cyst. If further evaluation is required would recommend pelvic ultrasound. Electronically Signed   By: Prudencio Pair M.D.   On: 01/22/2020 17:03    Orson Eva, DO  [facility]  If 7PM-7AM, please contact night-coverage [email or web address] Password [email or web address] 01/23/2020, 7:51 AM   LOS: 1 day

## 2020-01-23 NOTE — Progress Notes (Signed)
Called to see if pt is available for EEG. RN notified that she is moving OTF to a room shortly. Will try back later

## 2020-01-23 NOTE — Progress Notes (Signed)
Pt now alert and oriented x4, pt previously was alert but was not following commands / interacting with staff, very withdrawn, pt is now responsive and communicating with this RN, stated that previously she would not speak because "I am Bipolar and when I have episodes like that I am unable to communicate". Pt also verbalizes concerns about the liquor bottles / marijuana vape pens that's were previously found in her belongings. She stated that she normally does not drink but the alcohol was from when she recently went on a date with her husband, also stated that she has tremors that are usually controled by the marijuana pen. I let pt know that she could get her belongings back at discharge, pt verbalized understanding. No other needs at this time, will CTM.

## 2020-01-24 DIAGNOSIS — R569 Unspecified convulsions: Secondary | ICD-10-CM

## 2020-01-24 DIAGNOSIS — R4182 Altered mental status, unspecified: Secondary | ICD-10-CM

## 2020-01-24 LAB — CBC
HCT: 36.3 % (ref 36.0–46.0)
Hemoglobin: 11 g/dL — ABNORMAL LOW (ref 12.0–15.0)
MCH: 31.1 pg (ref 26.0–34.0)
MCHC: 30.3 g/dL (ref 30.0–36.0)
MCV: 102.5 fL — ABNORMAL HIGH (ref 80.0–100.0)
Platelets: 221 10*3/uL (ref 150–400)
RBC: 3.54 MIL/uL — ABNORMAL LOW (ref 3.87–5.11)
RDW: 14.2 % (ref 11.5–15.5)
WBC: 21.1 10*3/uL — ABNORMAL HIGH (ref 4.0–10.5)
nRBC: 0.1 % (ref 0.0–0.2)

## 2020-01-24 LAB — COMPREHENSIVE METABOLIC PANEL
ALT: 23 U/L (ref 0–44)
AST: 34 U/L (ref 15–41)
Albumin: 2.3 g/dL — ABNORMAL LOW (ref 3.5–5.0)
Alkaline Phosphatase: 77 U/L (ref 38–126)
BUN: 39 mg/dL — ABNORMAL HIGH (ref 6–20)
CO2: 19 mmol/L — ABNORMAL LOW (ref 22–32)
Calcium: 10.3 mg/dL (ref 8.9–10.3)
Chloride: >130 mmol/L (ref 98–111)
Creatinine, Ser: 1.43 mg/dL — ABNORMAL HIGH (ref 0.44–1.00)
GFR calc Af Amer: 53 mL/min — ABNORMAL LOW (ref >60)
GFR calc non Af Amer: 46 mL/min — ABNORMAL LOW (ref >60)
Glucose, Bld: 117 mg/dL — ABNORMAL HIGH (ref 70–99)
Potassium: 4.1 mmol/L (ref 3.5–5.1)
Sodium: 155 mmol/L — ABNORMAL HIGH (ref 135–145)
Total Bilirubin: 1.2 mg/dL (ref 0.3–1.2)
Total Protein: 5.8 g/dL — ABNORMAL LOW (ref 6.5–8.1)

## 2020-01-24 LAB — CULTURE, BLOOD (ROUTINE X 2)

## 2020-01-24 MED ORDER — DEXTROSE 5 % IV SOLN: INTRAVENOUS | Status: DC

## 2020-01-24 MED ORDER — CALCITONIN (SALMON) 200 UNIT/ML IJ SOLN
4.0000 [IU]/kg | Freq: Two times a day (BID) | INTRAMUSCULAR | Status: AC
  Administered 2020-01-24: 206 [IU] via SUBCUTANEOUS
  Filled 2020-01-24 (×2): qty 1.03

## 2020-01-24 MED ORDER — ZOLEDRONIC ACID 4 MG/5ML IV CONC
4.0000 mg | Freq: Once | INTRAVENOUS | Status: AC
  Administered 2020-01-24: 4 mg via INTRAVENOUS
  Filled 2020-01-24: qty 5

## 2020-01-24 NOTE — Progress Notes (Addendum)
PROGRESS NOTE  Theresa Mccoy KGY:[id number] DOB: [birth year] DOA: 01/22/2020 PCP: Kerin Perna, NP  Brief History:  39 y.o.femalewith medical history significant forhistory of SBO s/p resection(07/2019),hypertension, hyperparathyroidism, bipolar anxiety who presents with concerns of generalized weakness and body ache. Patient is encephalopathic at time of my evaluation.  History is obtained primarily from review of the medical record.  She is awake and alert and intermittently follows commands, but mumbles incoherently.  Intermittently, she does complain of pain all over.  Otherwise, review of systems is difficult to obtain and not obtainable.  Reportedly per ED documentation she presented here from home for generalized weakness and body aches for 1 week. Patient has not been able to get out of bed and stopped talking and has had altered mental status.  In the ED,She was afebrile, tachycardic up to 140s and intermittently hypotension on room air.   Had leukocytosis of 24.7. Lactate of 3.3 Na of 165, chloride 129, glucose 199, creatinine 3.06, BUN 116, normal LFTs and total bilirubin of 1.4.  UA was positive for nitrate and large leukocytes and many bacteria.   CT abdomen was obtained since pt had hx of SBO s/p small bowel resection in 0/2637 with complications of intra-abdominal abscess but refused IR drainage. New CT did not show any abscess but had non-obstructing 60mm right renal calculus.   There is a large 4.2 cm complex hyperdense lesion within the left adnexa that could representa a endometrioma/hemorrhagic cyst.   Assessment/Plan: Acute metabolic encephalopathy -Secondary to hypercalcemia and hyponatremia, AKI and sepsis/possible UTI -Continue IV fluids-->switch to D5W -01/24/20--much more alert and conversant, but pleasantly confused -EEG--diffuse encephalopathy  Pyuria -UA>50 WBC -Unfortunately, urine culture was not obtained -Continue empiric  ceftriaxone (started 01/22/2020)  Sepsis -present on admission -continue IVF -blood culture suggest contaminant -urine culture not obtained -lactic peaked 3.3 -continue ceftriaxone empirically -UA >50WBC  Hypercalcemia -Secondary to primary hyperparathyroidism -Corrected calcium 13.5 at time of presentation -01/24/20 corrected calcium 11.66 -Continue IV fluids -received calcitonin [place] -give zometa x 1 -09/29/2019 intact PTH 109 -09/26/2019 TSH 0.522 -was suppose to follow up with Dr. Harlow Asa for possible resection, but lost to follow up  Hypernatremia -Start D5W  AKI -baseline creatinine 0.7-0.9 -serum creatinine peaked 3.06 -improving with IVF  Bacteremia -represent contaminant -CoNS one of 2 sets  Bipolar disorder -Holding Risperdal and benztropine secondary to altered mental status presently     Status is: Inpatient  Remains inpatient appropriate because:Persistent severe electrolyte disturbances   Dispo: The patient is from: Home  Anticipated d/c is to: Home  Anticipated d/c date is: 2 days  Patient currently is not medically stable to d/c.        Family Communication: no  Family at bedside  Consultants:  none  Code Status:  FULL  DVT Prophylaxis:  [place] Heparin    Procedures: As Listed in Progress Note Above  Antibiotics: None     Subjective: Patient denies fevers, chills, headache, chest pain, dyspnea, nausea, vomiting, diarrhea, abdominal pain, dysuria,    Objective: Vitals:   01/24/20 0336 01/24/20 0758 01/24/20 0853 01/24/20 1200  BP: 116/90 106/87  116/70  Pulse: 91 85 82 88  Resp: 16 19 14 20   Temp: 98.3 F (36.8 C) 98.1 F (36.7 C)  97.7 F (36.5 C)  TempSrc: Oral Oral  Oral  SpO2: 100% 100% 100% 99%  Weight:   52.7 kg   Height:        Intake/Output Summary (  Last 24 hours) at 01/24/2020 1646 Last data filed at 01/24/2020 1221 Gross per 24 hour  Intake 240 ml    Output 2200 ml  Net -1960 ml   Weight change:  Exam:   General:  Pt is alert, follows commands appropriately, not in acute distress  HEENT: No icterus, No thrush, No neck mass, [place]/AT  Cardiovascular: RRR, S1/S2, no rubs, no gallops  Respiratory: bibasilar crackles; no wheeze  Abdomen: Soft/+BS, non tender, non distended, no guarding  Extremities: No edema, No lymphangitis, No petechiae, No rashes, no synovitis   Data Reviewed: I have personally reviewed following labs and imaging studies Basic Metabolic Panel: Recent Labs  Lab 01/23/20 0452 01/23/20 0826 01/23/20 1237 01/23/20 1712 01/24/20 0242  NA 164* 166* 168* 161* 155*  K 3.6 3.8 3.8 3.8 4.1  CL >130* >130* >130* >130* >130*  CO2 21* 22 20* 21* 19*  GLUCOSE 179* 138* 132* 172* 117*  BUN 85* 83* 76* 63* 39*  CREATININE 2.20* 2.27* 2.14* 1.90* 1.43*  CALCIUM 12.1* 12.2* 12.3* 11.1* 10.3   Liver Function Tests: Recent Labs  Lab 01/22/20 1409 01/24/20 0242  AST 18 34  ALT 19 23  ALKPHOS 109 77  BILITOT 1.4* 1.2  PROT 8.4* 5.8*  ALBUMIN 3.0* 2.3*   No results for input(s): LIPASE, AMYLASE in the last 168 hours. No results for input(s): AMMONIA in the last 168 hours. Coagulation Profile: Recent Labs  Lab 01/22/20 1409  INR 1.5*   CBC: Recent Labs  Lab 01/22/20 1409 01/23/20 0426 01/24/20 0242  WBC 24.7* 24.7* 21.1*  NEUTROABS 19.9*  --   --   HGB 14.9 11.3* 11.0*  HCT 49.0* 36.8 36.3  MCV 99.4 101.1* 102.5*  PLT 295 244 221   Cardiac Enzymes: No results for input(s): CKTOTAL, CKMB, CKMBINDEX, TROPONINI in the last 168 hours. BNP: Invalid input(s): POCBNP CBG: No results for input(s): GLUCAP in the last 168 hours. HbA1C: No results for input(s): HGBA1C in the last 72 hours. Urine analysis:    Component Value Date/Time   COLORURINE AMBER (A) 01/22/2020 1634   APPEARANCEUR HAZY (A) 01/22/2020 1634   APPEARANCEUR Cloudy (A) 02/02/2017 1510   LABSPEC 1.020 01/22/2020 1634   PHURINE  5.0 01/22/2020 1634   GLUCOSEU NEGATIVE 01/22/2020 1634   HGBUR MODERATE (A) 01/22/2020 1634   BILIRUBINUR NEGATIVE 01/22/2020 1634   BILIRUBINUR Negative 02/02/2017 1510   KETONESUR NEGATIVE 01/22/2020 1634   PROTEINUR 30 (A) 01/22/2020 1634   UROBILINOGEN 0.2 03/09/2017 1756   NITRITE POSITIVE (A) 01/22/2020 1634   LEUKOCYTESUR LARGE (A) 01/22/2020 1634   Sepsis Labs: @LABRCNTIP (procalcitonin:4,lacticidven:4) ) Recent Results (from the past 240 hour(s))  Culture, blood (Routine x 2)     Status: None (Preliminary result)   Collection Time: 01/22/20  2:09 PM   Specimen: BLOOD LEFT FOREARM  Result Value Ref Range Status   Specimen Description BLOOD LEFT FOREARM  Final   Special Requests   Final    BOTTLES DRAWN AEROBIC AND ANAEROBIC Blood Culture adequate volume   Culture   Final    NO GROWTH 2 DAYS Performed at [facility], [place] [address] [place], [place] [address]    Report Status PENDING  Incomplete  Culture, blood (Routine x 2)     Status: Abnormal   Collection Time: 01/22/20  2:24 PM   Specimen: BLOOD  Result Value Ref Range Status   Specimen Description BLOOD LEFT ANTECUBITAL  Final   Special Requests   Final  BOTTLES DRAWN AEROBIC ONLY Blood Culture results may not be optimal due to an excessive volume of blood received in culture bottles   Culture  Setup Time   Final    GRAM POSITIVE COCCI IN CLUSTERS AEROBIC BOTTLE ONLY CRITICAL RESULT CALLED TO, READ BACK BY AND VERIFIED WITH: Andres Shad, PHARMD AT 1027 01/23/20 BY L BENFIELD    Culture (A)  Final    STAPHYLOCOCCUS SPECIES (COAGULASE NEGATIVE) THE SIGNIFICANCE OF ISOLATING THIS ORGANISM FROM A SINGLE SET OF BLOOD CULTURES WHEN MULTIPLE SETS ARE DRAWN IS UNCERTAIN. PLEASE NOTIFY THE MICROBIOLOGY DEPARTMENT WITHIN ONE WEEK IF SPECIATION AND SENSITIVITIES ARE REQUIRED. Performed at [facility], [place] [address], [place], [place] [address]    Report Status 01/24/2020 FINAL  Final  Blood Culture ID Panel  (Reflexed)     Status: Abnormal   Collection Time: 01/22/20  2:24 PM  Result Value Ref Range Status   Enterococcus species NOT DETECTED NOT DETECTED Final   Listeria monocytogenes NOT DETECTED NOT DETECTED Final   Staphylococcus species DETECTED (A) NOT DETECTED Final    Comment: Methicillin (oxacillin) susceptible coagulase negative staphylococcus. Possible blood culture contaminant (unless isolated from more than one blood culture draw or clinical case suggests pathogenicity). No antibiotic treatment is indicated for blood  culture contaminants. CRITICAL RESULT CALLED TO, READ BACK BY AND VERIFIED WITH: Andres Shad, PHARMD AT 1027 01/23/20 BY L BENFIELD    Staphylococcus aureus (BCID) NOT DETECTED NOT DETECTED Final   Methicillin resistance NOT DETECTED NOT DETECTED Final   Streptococcus species NOT DETECTED NOT DETECTED Final   Streptococcus agalactiae NOT DETECTED NOT DETECTED Final   Streptococcus pneumoniae NOT DETECTED NOT DETECTED Final   Streptococcus pyogenes NOT DETECTED NOT DETECTED Final   Acinetobacter baumannii NOT DETECTED NOT DETECTED Final   Enterobacteriaceae species NOT DETECTED NOT DETECTED Final   Enterobacter cloacae complex NOT DETECTED NOT DETECTED Final   Escherichia coli NOT DETECTED NOT DETECTED Final   Klebsiella oxytoca NOT DETECTED NOT DETECTED Final   Klebsiella pneumoniae NOT DETECTED NOT DETECTED Final   Proteus species NOT DETECTED NOT DETECTED Final   Serratia marcescens NOT DETECTED NOT DETECTED Final   Haemophilus influenzae NOT DETECTED NOT DETECTED Final   Neisseria meningitidis NOT DETECTED NOT DETECTED Final   Pseudomonas aeruginosa NOT DETECTED NOT DETECTED Final   Candida albicans NOT DETECTED NOT DETECTED Final   Candida glabrata NOT DETECTED NOT DETECTED Final   Candida krusei NOT DETECTED NOT DETECTED Final   Candida parapsilosis NOT DETECTED NOT DETECTED Final   Candida tropicalis NOT DETECTED NOT DETECTED Final    Comment: Performed at  [facility], [address], [place], [place] [address]  SARS Coronavirus 2 by RT PCR (hospital order, performed in [facility] hospital lab) Nasopharyngeal Nasopharyngeal Swab     Status: None   Collection Time: 01/22/20  4:35 PM   Specimen: Nasopharyngeal Swab  Result Value Ref Range Status   SARS Coronavirus 2 NEGATIVE NEGATIVE Final    Comment: (NOTE) SARS-CoV-2 target nucleic acids are NOT DETECTED.  The SARS-CoV-2 RNA is generally detectable in upper and lower respiratory specimens during the acute phase of infection. The lowest concentration of SARS-CoV-2 viral copies this assay can detect is 250 copies / mL. A negative result does not preclude SARS-CoV-2 infection and should not be used as the sole basis for treatment or other patient management decisions.  A negative result may occur with improper specimen collection / handling, submission of specimen other than  nasopharyngeal swab, presence of viral mutation(s) within the areas targeted by this assay, and inadequate number of viral copies (<250 copies / mL). A negative result must be combined with clinical observations, patient history, and epidemiological information.  Fact Sheet for Patients:   StrictlyIdeas.no  Fact Sheet for Healthcare Providers: BankingDealers.co.za  This test is not yet approved or  cleared by the [place] FDA and has been authorized for detection and/or diagnosis of SARS-CoV-2 by FDA under an Emergency Use Authorization (EUA).  This EUA will remain in effect (meaning this test can be used) for the duration of the COVID-19 declaration under Section 564(b)(1) of the Act, 21 U.S.C. section 360bbb-3(b)(1), unless the authorization is terminated or revoked sooner.  Performed at [facility], [place] [address], [place], [place] [address]      Scheduled Meds: . heparin  5,000 Units Subcutaneous Q8H   Continuous Infusions: . cefTRIAXone  (ROCEPHIN)  IV 1 g (01/23/20 2128)  . dextrose 125 mL/hr at 01/24/20 1051  . zoledronic acid (ZOMETA) IV      Procedures/Studies: CT ABDOMEN PELVIS WO CONTRAST  Result Date: 01/22/2020 CLINICAL DATA:  Change in mental status question of sepsis EXAM: CT ABDOMEN AND PELVIS WITHOUT CONTRAST TECHNIQUE: Multidetector CT imaging of the abdomen and pelvis was performed following the standard protocol without IV contrast. COMPARISON:  July 17, 2019 FINDINGS: Cardiovascular: There are no significant vascular findings. The heart size is normal. There is no pericardial thickening or effusion. Mediastinum/Nodes: There are no enlarged mediastinal, hilar or axillary lymph nodes. The thyroid gland, trachea and esophagus demonstrate no significant findings. Lungs/Pleura: There are no areas consolidation. No suspicious pulmonary nodules. There is no pleural effusion. Upper abdomen: The visualized portion of the upper abdomen is unremarkable. Musculoskeletal/Chest wall: There is no chest wall mass or suspicious osseous finding. No acute osseous abnormality Abdomen/pelvis: Lower chest: The visualized heart size within normal limits. No pericardial fluid/thickening. No hiatal hernia. The visualized portions of the lungs are clear. Hepatobiliary: The liver is normal in density without focal abnormality.The main portal vein is patent. No evidence of calcified gallstones, gallbladder wall thickening or biliary dilatation. Pancreas: Unremarkable. No pancreatic ductal dilatation or surrounding inflammatory changes. Spleen: Normal in size without focal abnormality. Adrenals/Urinary Tract: Both adrenal glands appear normal. There is a 9 mm calculus seen in lower pole the right kidney. No collecting system calculi are seen. The bladder is unremarkable. The left kidney is unremarkable. Bladder is unremarkable. Stomach/Bowel: The stomach, small bowel, and colon are normal in appearance. No inflammatory changes, wall thickening, or  obstructive findings.The patient is status post small bowel anastomosis in the quadrant. Vascular/Lymphatic: There are no enlarged mesenteric, retroperitoneal, or pelvic lymph nodes. No significant vascular findings are present. Reproductive: Thin the left adnexa there is a fluid and hyperdense probable blood containing lesion measuring 4.2 cm which could represent a large complex hemorrhagic cyst. The uterus is grossly unremarkable. Other: No evidence of abdominal wall mass or hernia. Musculoskeletal: No acute or significant osseous findings. IMPRESSION: No acute intrathoracic pathology. Nonobstructing 9 mm right renal calculus. Large 4.2 cm complex hyperdense lesion within the left adnexa which could represent a endometrioma/hemorrhagic cyst. If further evaluation is required would recommend pelvic ultrasound. Electronically Signed   By: Prudencio Pair M.D.   On: 01/22/2020 17:03   DG Chest 1 View  Result Date: 01/22/2020 CLINICAL DATA:  Generalized weakness EXAM: CHEST  1 VIEW COMPARISON:  None. FINDINGS: The heart size and mediastinal contours are within normal limits.  Both lungs are clear. The visualized skeletal structures are unremarkable. IMPRESSION: No active disease. Electronically Signed   By: Prudencio Pair M.D.   On: 01/22/2020 16:21   CT Head Wo Contrast  Result Date: 01/22/2020 CLINICAL DATA:  Generalized weakness for 1 week with change in mental status, non communicative. EXAM: CT HEAD WITHOUT CONTRAST TECHNIQUE: Contiguous axial images were obtained from the base of the skull through the vertex without intravenous contrast. COMPARISON:  None. FINDINGS: Brain: No evidence of acute infarction, hemorrhage, hydrocephalus, extra-axial collection or mass lesion/mass effect. Vascular: No hyperdense vessel or unexpected calcification. Skull: No calvarial fracture or suspicious osseous lesion. No scalp swelling or hematoma. Sinuses/Orbits: Paranasal sinuses and mastoid air cells are predominantly clear.  Pneumatization of the petrous apices and squamosal temporal bones. Included orbital structures are unremarkable. Other: None IMPRESSION: No acute intracranial findings. Electronically Signed   By: Lovena Le M.D.   On: 01/22/2020 15:28   CT CHEST WO CONTRAST  Result Date: 01/22/2020 CLINICAL DATA:  Change in mental status question of sepsis EXAM: CT ABDOMEN AND PELVIS WITHOUT CONTRAST TECHNIQUE: Multidetector CT imaging of the abdomen and pelvis was performed following the standard protocol without IV contrast. COMPARISON:  July 17, 2019 FINDINGS: Cardiovascular: There are no significant vascular findings. The heart size is normal. There is no pericardial thickening or effusion. Mediastinum/Nodes: There are no enlarged mediastinal, hilar or axillary lymph nodes. The thyroid gland, trachea and esophagus demonstrate no significant findings. Lungs/Pleura: There are no areas consolidation. No suspicious pulmonary nodules. There is no pleural effusion. Upper abdomen: The visualized portion of the upper abdomen is unremarkable. Musculoskeletal/Chest wall: There is no chest wall mass or suspicious osseous finding. No acute osseous abnormality Abdomen/pelvis: Lower chest: The visualized heart size within normal limits. No pericardial fluid/thickening. No hiatal hernia. The visualized portions of the lungs are clear. Hepatobiliary: The liver is normal in density without focal abnormality.The main portal vein is patent. No evidence of calcified gallstones, gallbladder wall thickening or biliary dilatation. Pancreas: Unremarkable. No pancreatic ductal dilatation or surrounding inflammatory changes. Spleen: Normal in size without focal abnormality. Adrenals/Urinary Tract: Both adrenal glands appear normal. There is a 9 mm calculus seen in lower pole the right kidney. No collecting system calculi are seen. The bladder is unremarkable. The left kidney is unremarkable. Bladder is unremarkable. Stomach/Bowel: The stomach,  small bowel, and colon are normal in appearance. No inflammatory changes, wall thickening, or obstructive findings.The patient is status post small bowel anastomosis in the quadrant. Vascular/Lymphatic: There are no enlarged mesenteric, retroperitoneal, or pelvic lymph nodes. No significant vascular findings are present. Reproductive: Thin the left adnexa there is a fluid and hyperdense probable blood containing lesion measuring 4.2 cm which could represent a large complex hemorrhagic cyst. The uterus is grossly unremarkable. Other: No evidence of abdominal wall mass or hernia. Musculoskeletal: No acute or significant osseous findings. IMPRESSION: No acute intrathoracic pathology. Nonobstructing 9 mm right renal calculus. Large 4.2 cm complex hyperdense lesion within the left adnexa which could represent a endometrioma/hemorrhagic cyst. If further evaluation is required would recommend pelvic ultrasound. Electronically Signed   By: Prudencio Pair M.D.   On: 01/22/2020 17:03    Orson Eva, DO  [facility]  If 7PM-7AM, please contact night-coverage [email or web address] Password [email or web address] 01/24/2020, 4:46 PM   LOS: 2 days

## 2020-01-24 NOTE — Significant Event (Signed)
HOSPITAL MEDICINE OVERNIGHT EVENT NOTE  Notified by nursing of critical hyperchloremia on most recent chemistry.  Chart reviewed, while patient is hyperchloremic this is not any different from previous chemistries.  Degree of hypernatremia has also been widely variable although when compared to sodium levels approximately 24 hours ago (4am 7/19 to 4am 7/20 it seems to be correcting at an acceptable rate (164 to 155).    Discussed with nursing.  Apparently, patient is clinically improving and is currently awake alert and has been speaking on her phone throughout the evening.  Continue regimen of intravenous fluids, continue frequent chemistries.  Continue close clinical monitoring.  Theresa Mccoy Madalena Kesecker

## 2020-01-24 NOTE — Procedures (Signed)
Patient Name: Theresa Mccoy  MRN: [id number]  Epilepsy Attending: Lora Havens  Referring Physician/Provider: Dr Shanon Brow Tat Date: 01/23/2020 Duration: 24.41 mins  Patient history: 39yo F with ams. EEG to evaluate for seizure  Level of alertness: Awake  AEDs during EEG study: None  Technical aspects: This EEG study was done with scalp electrodes positioned according to the 10-20 International system of electrode placement. Electrical activity was acquired at a sampling rate of 500Hz  and reviewed with a high frequency filter of 70Hz  and a low frequency filter of 1Hz . EEG data were recorded continuously and digitally stored.   Description: The posterior dominant rhythm consists of 7.5-8 Hz activity of moderate voltage (25-35 uV) seen predominantly in posterior head regions, symmetric and reactive to eye opening and eye closing. EEG showed continuous generalized 3 to 6 Hz theta-delta slowing.  Hyperventilation and photic stimulation were not performed.     ABNORMALITY -Continuous slow, generalized  IMPRESSION: This study is suggestive of mild diffuse encephalopathy, nonspecific etiology. No seizures or epileptiform discharges were seen throughout the recording.  Shauntae Reitman Barbra Sarks

## 2020-01-25 LAB — MAGNESIUM: Magnesium: 1.5 mg/dL — ABNORMAL LOW (ref 1.7–2.4)

## 2020-01-25 LAB — CBC
HCT: 33.8 % — ABNORMAL LOW (ref 36.0–46.0)
Hemoglobin: 10.8 g/dL — ABNORMAL LOW (ref 12.0–15.0)
MCH: 30.8 pg (ref 26.0–34.0)
MCHC: 32 g/dL (ref 30.0–36.0)
MCV: 96.3 fL (ref 80.0–100.0)
Platelets: 264 10*3/uL (ref 150–400)
RBC: 3.51 MIL/uL — ABNORMAL LOW (ref 3.87–5.11)
RDW: 13.3 % (ref 11.5–15.5)
WBC: 16 10*3/uL — ABNORMAL HIGH (ref 4.0–10.5)
nRBC: 0.2 % (ref 0.0–0.2)

## 2020-01-25 LAB — COMPREHENSIVE METABOLIC PANEL
ALT: 31 U/L (ref 0–44)
AST: 48 U/L — ABNORMAL HIGH (ref 15–41)
Albumin: 2.6 g/dL — ABNORMAL LOW (ref 3.5–5.0)
Alkaline Phosphatase: 94 U/L (ref 38–126)
Anion gap: 6 (ref 5–15)
BUN: 16 mg/dL (ref 6–20)
CO2: 22 mmol/L (ref 22–32)
Calcium: 10.7 mg/dL — ABNORMAL HIGH (ref 8.9–10.3)
Chloride: 119 mmol/L — ABNORMAL HIGH (ref 98–111)
Creatinine, Ser: 1.17 mg/dL — ABNORMAL HIGH (ref 0.44–1.00)
GFR calc Af Amer: >60 mL/min (ref >60)
GFR calc non Af Amer: 59 mL/min — ABNORMAL LOW (ref >60)
Glucose, Bld: 126 mg/dL — ABNORMAL HIGH (ref 70–99)
Potassium: 3.4 mmol/L — ABNORMAL LOW (ref 3.5–5.1)
Sodium: 147 mmol/L — ABNORMAL HIGH (ref 135–145)
Total Bilirubin: 1 mg/dL (ref 0.3–1.2)
Total Protein: 6.4 g/dL — ABNORMAL LOW (ref 6.5–8.1)

## 2020-01-25 LAB — PROCALCITONIN: Procalcitonin: 2.11 ng/mL

## 2020-01-25 LAB — BRAIN NATRIURETIC PEPTIDE: B Natriuretic Peptide: 82.1 pg/mL (ref 0.0–100.0)

## 2020-01-25 MED ORDER — POTASSIUM CHLORIDE 10 MEQ/100ML IV SOLN
10.0000 meq | INTRAVENOUS | Status: AC
  Administered 2020-01-25 (×4): 10 meq via INTRAVENOUS
  Filled 2020-01-25 (×5): qty 100

## 2020-01-25 MED ORDER — POTASSIUM CHLORIDE CRYS ER 20 MEQ PO TBCR
40.0000 meq | EXTENDED_RELEASE_TABLET | Freq: Once | ORAL | Status: DC
  Filled 2020-01-25: qty 2

## 2020-01-25 MED ORDER — LACTATED RINGERS IV BOLUS
500.0000 mL | Freq: Once | INTRAVENOUS | Status: AC
  Administered 2020-01-25: 500 mL via INTRAVENOUS

## 2020-01-25 MED ORDER — POTASSIUM CHLORIDE 20 MEQ PO PACK: 40.0000 meq | PACK | Freq: Once | ORAL | Status: DC

## 2020-01-25 MED ORDER — POTASSIUM CHLORIDE 20 MEQ PO PACK
40.0000 meq | PACK | Freq: Once | ORAL | Status: AC
  Administered 2020-01-25: 40 meq via ORAL
  Filled 2020-01-25: qty 2

## 2020-01-25 MED ORDER — DEXTROSE 5 % IV SOLN: INTRAVENOUS | Status: DC

## 2020-01-25 MED ORDER — POTASSIUM CHLORIDE CRYS ER 20 MEQ PO TBCR
40.0000 meq | EXTENDED_RELEASE_TABLET | Freq: Once | ORAL | Status: AC
  Administered 2020-01-25: 40 meq via ORAL
  Filled 2020-01-25: qty 2

## 2020-01-25 MED ORDER — MAGNESIUM SULFATE IN D5W 1-5 GM/100ML-% IV SOLN
1.0000 g | Freq: Once | INTRAVENOUS | Status: AC
  Administered 2020-01-25: 1 g via INTRAVENOUS
  Filled 2020-01-25: qty 100

## 2020-01-25 MED ORDER — METOPROLOL TARTRATE 50 MG PO TABS
50.0000 mg | ORAL_TABLET | Freq: Two times a day (BID) | ORAL | Status: DC
  Administered 2020-01-25 (×2): 50 mg via ORAL
  Filled 2020-01-25 (×4): qty 1

## 2020-01-25 MED ORDER — MAGNESIUM SULFATE 2 GM/50ML IV SOLN
2.0000 g | Freq: Once | INTRAVENOUS | Status: AC
  Administered 2020-01-25: 2 g via INTRAVENOUS
  Filled 2020-01-25: qty 50

## 2020-01-25 NOTE — Progress Notes (Signed)
PROGRESS NOTE  Theresa Mccoy YKD:[id number] DOB: May 15, 1981 DOA: 01/22/2020 PCP: Theresa Perna, NP  Brief History:   39 y.o.femalewith medical history significant forhistory of SBO s/p resection(07/2019),hypertension, hyperparathyroidism, bipolar anxiety who presents with concerns of generalized weakness and body ache.  Resented to the hospital with encephalopathy was found to have severe hypercalcemia and admitted.   Assessment/Plan:  History of hyperparathyroidism with poor outpatient follow-up causing severe hypercalcemia, dehydration, hyponatremia and AKI.  Being treated with IV fluids, renal function calcium levels are improving along with hyponatremia.  Continue hydration.  She has so far received Zometa x1 along with Sulman calcitonin subcu.  She has longstanding history of hyperparathyroidism and was supposed to follow with surgeon Dr. Lynford Humphrey but could not.  Will ensure outpatient Endo and surgery follow-up.  PTH levels were 109.  Acute metabolic encephalopathy caused by hypercalcemia.  Head CT stable, no focal deficits or headache, improving with supportive care.  EEG nonacute.  Mentation improving.  UTI.  Rocephin x3 days.  SIRs - at the time of admission no sepsis, this was due to extreme dehydration from hypercalcemia which has improved.  Coag negative staph bacteremia - consistent with contamination.  Monitor.   Bipolar disorder - Holding Risperdal and benztropine secondary to altered mental status presently, obtain psych input to help with medication titration.  Mentation is improving.  Sustained sinus tachycardia.  Due to dehydration.  Hydrate, TSH stable, low-dose beta-blocker.  CT abdomen pelvis showing adnexal cyst.  Outpatient follow-up with OB for pelvic ultrasound.  Hypocalcemia and hypomagnesemia.  Replaced.     Status is: Inpatient  Remains inpatient appropriate because:Persistent severe electrolyte disturbances   Dispo: The  patient is from: Home  Anticipated d/c is to: Home  Anticipated d/c date is: 2 days  Patient currently is not medically stable to d/c.   Family Communication:  Message for mother Vaughan Basta on 01/25/2020 on her cell phone at 9:20 AM.  Consultants:  none  Code Status:  FULL  DVT Prophylaxis:  [place] Heparin    Procedures: As Listed in Progress Note Above  Antibiotics: None     Subjective:  Patient in bed, appears comfortable, denies any headache, no fever, no chest pain or pressure, no shortness of breath , no abdominal pain. No focal weakness.    Objective: Vitals:   01/24/20 1200 01/24/20 1600 01/24/20 2104 01/25/20 0501  BP: 116/70 (!) 104/91 115/80 113/82  Pulse: 88 86 92 (!) 101  Resp: 20 13 19 20   Temp: 97.7 F (36.5 C) 98.8 F (37.1 C) 98.5 F (36.9 C) 99.2 F (37.3 C)  TempSrc: Oral Oral Axillary Axillary  SpO2: 99% 100% 97% 97%  Weight:    56.8 kg  Height:        Intake/Output Summary (Last 24 hours) at 01/25/2020 0919 Last data filed at 01/25/2020 0504 Gross per 24 hour  Intake 220 ml  Output 1550 ml  Net -1330 ml   Weight change: -8.536 kg Exam:  Awake Alert, No new F.N deficits, bizzare affect [name].AT,PERRAL Supple Neck,No JVD, No cervical lymphadenopathy appriciated.  Symmetrical Chest wall movement, Good air movement bilaterally, CTAB Rapid RRR,No Gallops, Rubs or new Murmurs, No Parasternal Heave +ve B.Sounds, Abd Soft, No tenderness, No organomegaly appriciated, No rebound - guarding or rigidity. No Cyanosis, Clubbing or edema, No new Rash or bruise   Data Reviewed: I have personally reviewed following labs and imaging studies Basic Metabolic Panel: Recent Labs  Lab 01/23/20 0826  01/23/20 1237 01/23/20 1712 01/24/20 0242 01/25/20 0317  NA 166* 168* 161* 155* 147*  K 3.8 3.8 3.8 4.1 3.4*  CL >130* >130* >130* >130* 119*  CO2 22 20* 21* 19* 22  GLUCOSE 138* 132* 172* 117* 126*  BUN 83* 76*  63* 39* 16  CREATININE 2.27* 2.14* 1.90* 1.43* 1.17*  CALCIUM 12.2* 12.3* 11.1* 10.3 10.7*  MG  --   --   --   --  1.5*   Liver Function Tests: Recent Labs  Lab 01/22/20 1409 01/24/20 0242 01/25/20 0317  AST 18 34 48*  ALT 19 23 31   ALKPHOS 109 77 94  BILITOT 1.4* 1.2 1.0  PROT 8.4* 5.8* 6.4*  ALBUMIN 3.0* 2.3* 2.6*   No results for input(s): LIPASE, AMYLASE in the last 168 hours. No results for input(s): AMMONIA in the last 168 hours. Coagulation Profile: Recent Labs  Lab 01/22/20 1409  INR 1.5*   CBC: Recent Labs  Lab 01/22/20 1409 01/23/20 0426 01/24/20 0242 01/25/20 0317  WBC 24.7* 24.7* 21.1* 16.0*  NEUTROABS 19.9*  --   --   --   HGB 14.9 11.3* 11.0* 10.8*  HCT 49.0* 36.8 36.3 33.8*  MCV 99.4 101.1* 102.5* 96.3  PLT 295 244 221 264   Cardiac Enzymes: No results for input(s): CKTOTAL, CKMB, CKMBINDEX, TROPONINI in the last 168 hours. BNP: Invalid input(s): POCBNP CBG: No results for input(s): GLUCAP in the last 168 hours. HbA1C: No results for input(s): HGBA1C in the last 72 hours. Urine analysis:    Component Value Date/Time   COLORURINE AMBER (A) 01/22/2020 1634   APPEARANCEUR HAZY (A) 01/22/2020 1634   APPEARANCEUR Cloudy (A) 02/02/2017 1510   LABSPEC 1.020 01/22/2020 1634   PHURINE 5.0 01/22/2020 1634   GLUCOSEU NEGATIVE 01/22/2020 1634   HGBUR MODERATE (A) 01/22/2020 1634   BILIRUBINUR NEGATIVE 01/22/2020 1634   BILIRUBINUR Negative 02/02/2017 1510   KETONESUR NEGATIVE 01/22/2020 1634   PROTEINUR 30 (A) 01/22/2020 1634   UROBILINOGEN 0.2 03/09/2017 1756   NITRITE POSITIVE (A) 01/22/2020 1634   LEUKOCYTESUR LARGE (A) 01/22/2020 1634   Sepsis Labs: @LABRCNTIP (procalcitonin:4,lacticidven:4) ) Recent Results (from the past 240 hour(s))  Culture, blood (Routine x 2)     Status: None (Preliminary result)   Collection Time: 01/22/20  2:09 PM   Specimen: BLOOD LEFT FOREARM  Result Value Ref Range Status   Specimen Description BLOOD LEFT  FOREARM  Final   Special Requests   Final    BOTTLES DRAWN AEROBIC AND ANAEROBIC Blood Culture adequate volume   Culture   Final    NO GROWTH 3 DAYS Performed at [facility], [place] [address] [place], [place] [address]    Report Status PENDING  Incomplete  Culture, blood (Routine x 2)     Status: Abnormal   Collection Time: 01/22/20  2:24 PM   Specimen: BLOOD  Result Value Ref Range Status   Specimen Description BLOOD LEFT ANTECUBITAL  Final   Special Requests   Final    BOTTLES DRAWN AEROBIC ONLY Blood Culture results may not be optimal due to an excessive volume of blood received in culture bottles   Culture  Setup Time   Final    GRAM POSITIVE COCCI IN CLUSTERS AEROBIC BOTTLE ONLY CRITICAL RESULT CALLED TO, READ BACK BY AND VERIFIED WITH: Andres Shad, PHARMD AT 1027 01/23/20 BY L BENFIELD    Culture (A)  Final    STAPHYLOCOCCUS SPECIES (COAGULASE NEGATIVE) THE SIGNIFICANCE OF ISOLATING THIS ORGANISM FROM A  SINGLE SET OF BLOOD CULTURES WHEN MULTIPLE SETS ARE DRAWN IS UNCERTAIN. PLEASE NOTIFY THE MICROBIOLOGY DEPARTMENT WITHIN ONE WEEK IF SPECIATION AND SENSITIVITIES ARE REQUIRED. Performed at [facility], [place] [address], [place], [place] [address]    Report Status 01/24/2020 FINAL  Final  Blood Culture ID Panel (Reflexed)     Status: Abnormal   Collection Time: 01/22/20  2:24 PM  Result Value Ref Range Status   Enterococcus species NOT DETECTED NOT DETECTED Final   Listeria monocytogenes NOT DETECTED NOT DETECTED Final   Staphylococcus species DETECTED (A) NOT DETECTED Final    Comment: Methicillin (oxacillin) susceptible coagulase negative staphylococcus. Possible blood culture contaminant (unless isolated from more than one blood culture draw or clinical case suggests pathogenicity). No antibiotic treatment is indicated for blood  culture contaminants. CRITICAL RESULT CALLED TO, READ BACK BY AND VERIFIED WITH: Andres Shad, PHARMD AT 1027 01/23/20 BY L BENFIELD     Staphylococcus aureus (BCID) NOT DETECTED NOT DETECTED Final   Methicillin resistance NOT DETECTED NOT DETECTED Final   Streptococcus species NOT DETECTED NOT DETECTED Final   Streptococcus agalactiae NOT DETECTED NOT DETECTED Final   Streptococcus pneumoniae NOT DETECTED NOT DETECTED Final   Streptococcus pyogenes NOT DETECTED NOT DETECTED Final   Acinetobacter baumannii NOT DETECTED NOT DETECTED Final   Enterobacteriaceae species NOT DETECTED NOT DETECTED Final   Enterobacter cloacae complex NOT DETECTED NOT DETECTED Final   Escherichia coli NOT DETECTED NOT DETECTED Final   Klebsiella oxytoca NOT DETECTED NOT DETECTED Final   Klebsiella pneumoniae NOT DETECTED NOT DETECTED Final   Proteus species NOT DETECTED NOT DETECTED Final   Serratia marcescens NOT DETECTED NOT DETECTED Final   Haemophilus influenzae NOT DETECTED NOT DETECTED Final   Neisseria meningitidis NOT DETECTED NOT DETECTED Final   Pseudomonas aeruginosa NOT DETECTED NOT DETECTED Final   Candida albicans NOT DETECTED NOT DETECTED Final   Candida glabrata NOT DETECTED NOT DETECTED Final   Candida krusei NOT DETECTED NOT DETECTED Final   Candida parapsilosis NOT DETECTED NOT DETECTED Final   Candida tropicalis NOT DETECTED NOT DETECTED Final    Comment: Performed at [facility], [address], [place], [place] [address]  SARS Coronavirus 2 by RT PCR (hospital order, performed in [facility] hospital lab) Nasopharyngeal Nasopharyngeal Swab     Status: None   Collection Time: 01/22/20  4:35 PM   Specimen: Nasopharyngeal Swab  Result Value Ref Range Status   SARS Coronavirus 2 NEGATIVE NEGATIVE Final    Comment: (NOTE) SARS-CoV-2 target nucleic acids are NOT DETECTED.  The SARS-CoV-2 RNA is generally detectable in upper and lower respiratory specimens during the acute phase of infection. The lowest concentration of SARS-CoV-2 viral copies this assay can detect is 250 copies / mL. A negative result does not  preclude SARS-CoV-2 infection and should not be used as the sole basis for treatment or other patient management decisions.  A negative result may occur with improper specimen collection / handling, submission of specimen other than nasopharyngeal swab, presence of viral mutation(s) within the areas targeted by this assay, and inadequate number of viral copies (<250 copies / mL). A negative result must be combined with clinical observations, patient history, and epidemiological information.  Fact Sheet for Patients:   StrictlyIdeas.no  Fact Sheet for Healthcare Providers: BankingDealers.co.za  This test is not yet approved or  cleared by the [place] FDA and has been authorized for detection and/or diagnosis of SARS-CoV-2 by FDA under an Emergency Use  Authorization (EUA).  This EUA will remain in effect (meaning this test can be used) for the duration of the COVID-19 declaration under Section 564(b)(1) of the Act, 21 U.S.C. section 360bbb-3(b)(1), unless the authorization is terminated or revoked sooner.  Performed at [facility], [place] [address] [place], [place] [address]      Scheduled Meds: . heparin  5,000 Units Subcutaneous Q8H  . metoprolol tartrate  50 mg Oral BID  . potassium chloride  40 mEq Oral Once   Continuous Infusions: . cefTRIAXone (ROCEPHIN)  IV 1 g (01/24/20 2129)  . dextrose    . lactated ringers      Procedures/Studies: CT ABDOMEN PELVIS WO CONTRAST  Result Date: 01/22/2020 CLINICAL DATA:  Change in mental status question of sepsis EXAM: CT ABDOMEN AND PELVIS WITHOUT CONTRAST TECHNIQUE: Multidetector CT imaging of the abdomen and pelvis was performed following the standard protocol without IV contrast. COMPARISON:  July 17, 2019 FINDINGS: Cardiovascular: There are no significant vascular findings. The heart size is normal. There is no pericardial thickening or effusion. Mediastinum/Nodes: There  are no enlarged mediastinal, hilar or axillary lymph nodes. The thyroid gland, trachea and esophagus demonstrate no significant findings. Lungs/Pleura: There are no areas consolidation. No suspicious pulmonary nodules. There is no pleural effusion. Upper abdomen: The visualized portion of the upper abdomen is unremarkable. Musculoskeletal/Chest wall: There is no chest wall mass or suspicious osseous finding. No acute osseous abnormality Abdomen/pelvis: Lower chest: The visualized heart size within normal limits. No pericardial fluid/thickening. No hiatal hernia. The visualized portions of the lungs are clear. Hepatobiliary: The liver is normal in density without focal abnormality.The main portal vein is patent. No evidence of calcified gallstones, gallbladder wall thickening or biliary dilatation. Pancreas: Unremarkable. No pancreatic ductal dilatation or surrounding inflammatory changes. Spleen: Normal in size without focal abnormality. Adrenals/Urinary Tract: Both adrenal glands appear normal. There is a 9 mm calculus seen in lower pole the right kidney. No collecting system calculi are seen. The bladder is unremarkable. The left kidney is unremarkable. Bladder is unremarkable. Stomach/Bowel: The stomach, small bowel, and colon are normal in appearance. No inflammatory changes, wall thickening, or obstructive findings.The patient is status post small bowel anastomosis in the quadrant. Vascular/Lymphatic: There are no enlarged mesenteric, retroperitoneal, or pelvic lymph nodes. No significant vascular findings are present. Reproductive: Thin the left adnexa there is a fluid and hyperdense probable blood containing lesion measuring 4.2 cm which could represent a large complex hemorrhagic cyst. The uterus is grossly unremarkable. Other: No evidence of abdominal wall mass or hernia. Musculoskeletal: No acute or significant osseous findings. IMPRESSION: No acute intrathoracic pathology. Nonobstructing 9 mm right renal  calculus. Large 4.2 cm complex hyperdense lesion within the left adnexa which could represent a endometrioma/hemorrhagic cyst. If further evaluation is required would recommend pelvic ultrasound. Electronically Signed   By: Prudencio Pair M.D.   On: 01/22/2020 17:03   DG Chest 1 View  Result Date: 01/22/2020 CLINICAL DATA:  Generalized weakness EXAM: CHEST  1 VIEW COMPARISON:  None. FINDINGS: The heart size and mediastinal contours are within normal limits. Both lungs are clear. The visualized skeletal structures are unremarkable. IMPRESSION: No active disease. Electronically Signed   By: Prudencio Pair M.D.   On: 01/22/2020 16:21   CT Head Wo Contrast  Result Date: 01/22/2020 CLINICAL DATA:  Generalized weakness for 1 week with change in mental status, non communicative. EXAM: CT HEAD WITHOUT CONTRAST TECHNIQUE: Contiguous axial images were obtained from the base of the skull  through the vertex without intravenous contrast. COMPARISON:  None. FINDINGS: Brain: No evidence of acute infarction, hemorrhage, hydrocephalus, extra-axial collection or mass lesion/mass effect. Vascular: No hyperdense vessel or unexpected calcification. Skull: No calvarial fracture or suspicious osseous lesion. No scalp swelling or hematoma. Sinuses/Orbits: Paranasal sinuses and mastoid air cells are predominantly clear. Pneumatization of the petrous apices and squamosal temporal bones. Included orbital structures are unremarkable. Other: None IMPRESSION: No acute intracranial findings. Electronically Signed   By: Lovena Le M.D.   On: 01/22/2020 15:28   CT CHEST WO CONTRAST  Result Date: 01/22/2020 CLINICAL DATA:  Change in mental status question of sepsis EXAM: CT ABDOMEN AND PELVIS WITHOUT CONTRAST TECHNIQUE: Multidetector CT imaging of the abdomen and pelvis was performed following the standard protocol without IV contrast. COMPARISON:  July 17, 2019 FINDINGS: Cardiovascular: There are no significant vascular findings. The  heart size is normal. There is no pericardial thickening or effusion. Mediastinum/Nodes: There are no enlarged mediastinal, hilar or axillary lymph nodes. The thyroid gland, trachea and esophagus demonstrate no significant findings. Lungs/Pleura: There are no areas consolidation. No suspicious pulmonary nodules. There is no pleural effusion. Upper abdomen: The visualized portion of the upper abdomen is unremarkable. Musculoskeletal/Chest wall: There is no chest wall mass or suspicious osseous finding. No acute osseous abnormality Abdomen/pelvis: Lower chest: The visualized heart size within normal limits. No pericardial fluid/thickening. No hiatal hernia. The visualized portions of the lungs are clear. Hepatobiliary: The liver is normal in density without focal abnormality.The main portal vein is patent. No evidence of calcified gallstones, gallbladder wall thickening or biliary dilatation. Pancreas: Unremarkable. No pancreatic ductal dilatation or surrounding inflammatory changes. Spleen: Normal in size without focal abnormality. Adrenals/Urinary Tract: Both adrenal glands appear normal. There is a 9 mm calculus seen in lower pole the right kidney. No collecting system calculi are seen. The bladder is unremarkable. The left kidney is unremarkable. Bladder is unremarkable. Stomach/Bowel: The stomach, small bowel, and colon are normal in appearance. No inflammatory changes, wall thickening, or obstructive findings.The patient is status post small bowel anastomosis in the quadrant. Vascular/Lymphatic: There are no enlarged mesenteric, retroperitoneal, or pelvic lymph nodes. No significant vascular findings are present. Reproductive: Thin the left adnexa there is a fluid and hyperdense probable blood containing lesion measuring 4.2 cm which could represent a large complex hemorrhagic cyst. The uterus is grossly unremarkable. Other: No evidence of abdominal wall mass or hernia. Musculoskeletal: No acute or significant  osseous findings. IMPRESSION: No acute intrathoracic pathology. Nonobstructing 9 mm right renal calculus. Large 4.2 cm complex hyperdense lesion within the left adnexa which could represent a endometrioma/hemorrhagic cyst. If further evaluation is required would recommend pelvic ultrasound. Electronically Signed   By: Prudencio Pair M.D.   On: 01/22/2020 17:03    Lala Lund, DO  [facility]  If 7PM-7AM, please contact night-coverage [email or web address] Password [facility] 01/25/2020, 9:19 AM   LOS: 3 days

## 2020-01-25 NOTE — Progress Notes (Signed)
Patient declining care at this time. Nurse instructed [facility] to "hold off on IV right now". Instructed nurse to reconsult [facility] when patient needing PIV. VU. Fran Lowes, RN [facility]
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
## 2020-01-25 NOTE — Evaluation (Signed)
Physical Therapy Evaluation Patient Details Name: Theresa Mccoy MRN: [id number] DOB: 10-14-1980 Today's Date: 01/25/2020   History of Present Illness  39 yo female admitted to ED on 7/18 with weakness, body aches, and AMS secondary to UTI. CT abdomen reveals non-obstructive 9 mm R renal calculus, incidental finding of 4.2 cm complex hyperdense lesion within the left adnexa that could represent a endometrioma/hemorrhagic cyst. 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  Clinical Impression   Pt presents with generalized weakness, generalized pain as pt unable to pinpoint, impaired safety awareness but unsure of baseline, impaired standing balance with history of falls, and decreased activity tolerance. Pt to benefit from acute PT to address deficits. Pt ambulated room distance with close guard and occasional min assist for steadying, pt limited by fatigue and tachycardia. PT recommending OPPT to address fall risk and improve functional mobility, will also place OT consult for pt ADLs. PT to progress mobility as tolerated, and will continue to follow acutely.      Follow Up Recommendations Outpatient PT;Supervision for mobility/OOB    Equipment Recommendations  None recommended by PT    Recommendations for Other Services       Precautions / Restrictions Precautions Precautions: Fall Precaution Comments: urinary incontinence Restrictions Weight Bearing Restrictions: No      Mobility  Bed Mobility Overal bed mobility: Needs Assistance Bed Mobility: Supine to Sit     Supine to sit: Supervision;HOB elevated     General bed mobility comments: for safety, increased time with use of HOB elevation and bedrails.  Transfers Overall transfer level: Needs assistance Equipment used: None Transfers: Sit to/from Stand Sit to Stand: From elevated surface;Min assist         General transfer comment: Min  assist to steady upon standing, otherwise pt reaching for environment to steady. Pt stood by herself when PT left room to get fresh linens (pt soiled in urine), even with max cuing to wait for PT prior to mobilizing. sit to stand x4, from EOB, chair x2, and toilet  Ambulation/Gait Ambulation/Gait assistance: Min guard;Min assist Gait Distance (Feet): 25 Feet Assistive device: None Gait Pattern/deviations: Step-through pattern;Decreased stride length Gait velocity: decr   General Gait Details: Min guard for safety, occasional min assist to steady. PT assisting with lines/leads, HRmax 149 bpm when mobilizing. Pt generally unsteady with history of falls.  Stairs            Wheelchair Mobility    Modified Rankin (Stroke Patients Only)       Balance Overall balance assessment: Needs assistance;History of Falls Sitting-balance support: No upper extremity supported Sitting balance-Leahy Scale: Good     Standing balance support: No upper extremity supported;During functional activity Standing balance-Leahy Scale: Fair Standing balance comment: ambulates without AD, noticeable unsteadiness requiring occasional min assist to correct                             Pertinent Vitals/Pain Pain Assessment: Faces Faces Pain Scale: Hurts even more Pain Location: "all over" Pain Descriptors / Indicators: Sore Pain Intervention(s): Limited activity within patient's tolerance;Monitored during session;Repositioned    Home Living Family/patient expects to be discharged to:: Private residence Living Arrangements: Spouse/significant other;Children Available Help at Discharge: Family;Available 24 hours/day Type of Home: Other(Comment) (pt states she is living in a condemned building, used to be a Materials engineer) Home Access: Level entry     Home Layout: One level Home  Equipment: None      Prior Function Level of Independence: Needs assistance   Gait / Transfers Assistance Needed: pt  states shes walks but "my balance is off"; pt reports falls  ADL's / Homemaking Assistance Needed: husband assists with bathing as needed, states "especially when I mess in the bed"        Hand Dominance   Dominant Hand: Right    Extremity/Trunk Assessment   Upper Extremity Assessment Upper Extremity Assessment: Defer to OT evaluation    Lower Extremity Assessment Lower Extremity Assessment: Generalized weakness    Cervical / Trunk Assessment Cervical / Trunk Assessment: Normal  Communication   Communication: No difficulties  Cognition Arousal/Alertness: Awake/alert Behavior During Therapy: WFL for tasks assessed/performed Overall Cognitive Status: No family/caregiver present to determine baseline cognitive functioning Area of Impairment: Following commands;Safety/judgement;Problem solving;Attention                   Current Attention Level: Sustained   Following Commands: Follows one step commands inconsistently Safety/Judgement: Decreased awareness of deficits;Decreased awareness of safety   Problem Solving: Requires verbal cues;Requires tactile cues General Comments: A&Ox4, with increased time. Pt laughs inappropriately during session, is incontinent of urine which pt states is an ongoing issue at home. Pt highly distractible, pt follows commands well when she chooses to follow them, but at times disregards cuing for safety. Lacks safety awareness during mobility.      General Comments General comments (skin integrity, edema, etc.): HR 100-149 bpm during mobility, pt asymptomatic. PT assist for washing up, changing gowns as pt soiled in urine upon arrival to room    Exercises     Assessment/Plan    PT Assessment Patient needs continued PT services  PT Problem List Decreased strength;Decreased mobility;Decreased safety awareness;Decreased activity tolerance;Pain;Decreased cognition;Decreased balance       PT Treatment Interventions DME  instruction;Therapeutic activities;Gait training;Therapeutic exercise;Patient/family education;Balance training;Functional mobility training;Neuromuscular re-education    PT Goals (Current goals can be found in the Care Plan section)  Acute Rehab PT Goals Patient Stated Goal: stop hurting, improve balance PT Goal Formulation: With patient Time For Goal Achievement: 02/08/20 Potential to Achieve Goals: Good    Frequency Min 3X/week   Barriers to discharge        Co-evaluation               AM-PAC PT "6 Clicks" Mobility  Outcome Measure Help needed turning from your back to your side while in a flat bed without using bedrails?: A Little Help needed moving from lying on your back to sitting on the side of a flat bed without using bedrails?: A Little Help needed moving to and from a bed to a chair (including a wheelchair)?: A Little Help needed standing up from a chair using your arms (e.g., wheelchair or bedside chair)?: A Little Help needed to walk in hospital room?: A Little Help needed climbing 3-5 steps with a railing? : A Little 6 Click Score: 18    End of Session   Activity Tolerance: Patient limited by fatigue;Patient tolerated treatment well Patient left: in chair;with call bell/phone within reach (posey alarm not working, Therapist, sports notified) Nurse Communication: Mobility status PT Visit Diagnosis: Other abnormalities of gait and mobility (R26.89);Muscle weakness (generalized) (M62.81)    Time: 1610-9604 PT Time Calculation (min) (ACUTE ONLY): 32 min   Charges:   PT Evaluation $PT Eval Low Complexity: 1 Low PT Treatments $Gait Training: 8-22 mins        Aras Albarran E, PT Acute  Rehabilitation Services Pager [phone]  Office [phone]   Roxine Caddy D Elonda Husky 01/25/2020, 10:15 AM

## 2020-01-26 LAB — CBC WITH DIFFERENTIAL/PLATELET
Abs Immature Granulocytes: 0.05 10*3/uL (ref 0.00–0.07)
Basophils Absolute: 0 10*3/uL (ref 0.0–0.1)
Basophils Relative: 0 %
Eosinophils Absolute: 0.1 10*3/uL (ref 0.0–0.5)
Eosinophils Relative: 1 %
HCT: 33.2 % — ABNORMAL LOW (ref 36.0–46.0)
Hemoglobin: 10 g/dL — ABNORMAL LOW (ref 12.0–15.0)
Immature Granulocytes: 1 %
Lymphocytes Relative: 21 %
Lymphs Abs: 2.2 10*3/uL (ref 0.7–4.0)
MCH: 30.4 pg (ref 26.0–34.0)
MCHC: 30.1 g/dL (ref 30.0–36.0)
MCV: 100.9 fL — ABNORMAL HIGH (ref 80.0–100.0)
Monocytes Absolute: 0.5 10*3/uL (ref 0.1–1.0)
Monocytes Relative: 5 %
Neutro Abs: 7.5 10*3/uL (ref 1.7–7.7)
Neutrophils Relative %: 72 %
Platelets: 211 10*3/uL (ref 150–400)
RBC: 3.29 MIL/uL — ABNORMAL LOW (ref 3.87–5.11)
RDW: 13.1 % (ref 11.5–15.5)
WBC: 10.5 10*3/uL (ref 4.0–10.5)
nRBC: 0.2 % (ref 0.0–0.2)

## 2020-01-26 LAB — COMPREHENSIVE METABOLIC PANEL
ALT: 27 U/L (ref 0–44)
AST: 42 U/L — ABNORMAL HIGH (ref 15–41)
Albumin: 2.3 g/dL — ABNORMAL LOW (ref 3.5–5.0)
Alkaline Phosphatase: 76 U/L (ref 38–126)
Anion gap: 6 (ref 5–15)
BUN: 9 mg/dL (ref 6–20)
CO2: 18 mmol/L — ABNORMAL LOW (ref 22–32)
Calcium: 10 mg/dL (ref 8.9–10.3)
Chloride: 120 mmol/L — ABNORMAL HIGH (ref 98–111)
Creatinine, Ser: 1.04 mg/dL — ABNORMAL HIGH (ref 0.44–1.00)
GFR calc Af Amer: >60 mL/min (ref >60)
GFR calc non Af Amer: >60 mL/min (ref >60)
Glucose, Bld: 96 mg/dL (ref 70–99)
Potassium: 4.2 mmol/L (ref 3.5–5.1)
Sodium: 144 mmol/L (ref 135–145)
Total Bilirubin: 0.5 mg/dL (ref 0.3–1.2)
Total Protein: 5.7 g/dL — ABNORMAL LOW (ref 6.5–8.1)

## 2020-01-26 LAB — TSH: TSH: 1.68 u[IU]/mL (ref 0.350–4.500)

## 2020-01-26 LAB — MAGNESIUM: Magnesium: 2 mg/dL (ref 1.7–2.4)

## 2020-01-26 LAB — C-REACTIVE PROTEIN: CRP: 1.1 mg/dL — ABNORMAL HIGH (ref <1.0)

## 2020-01-26 LAB — PROCALCITONIN: Procalcitonin: 1.39 ng/mL

## 2020-01-26 LAB — BRAIN NATRIURETIC PEPTIDE: B Natriuretic Peptide: 57.8 pg/mL (ref 0.0–100.0)

## 2020-01-26 MED ORDER — LACTATED RINGERS IV SOLN: INTRAVENOUS | Status: DC

## 2020-01-26 MED ORDER — LACTATED RINGERS IV BOLUS
500.0000 mL | Freq: Once | INTRAVENOUS | Status: AC
  Administered 2020-01-26: 500 mL via INTRAVENOUS

## 2020-01-26 MED ORDER — LACTATED RINGERS IV BOLUS
1000.0000 mL | Freq: Once | INTRAVENOUS | Status: AC
  Administered 2020-01-26: 1000 mL via INTRAVENOUS

## 2020-01-26 NOTE — Progress Notes (Signed)
Pt found to be smoking a CBD Vape pen in room, Pt told that it could not smoke in room, Vapr pen room and sent to Security. IV consult place place so pt could receive IV potassium. Pt refuse PO because it had made her throw up earlier in the day.

## 2020-01-26 NOTE — Progress Notes (Signed)
PROGRESS NOTE  Theresa Mccoy OJJ:[id number] DOB: 01/28/1981 DOA: 01/22/2020 PCP: Kerin Perna, NP  Brief History:   39 y.o.femalewith medical history significant forhistory of SBO s/p resection(07/2019),hypertension, hyperparathyroidism, bipolar anxiety who presents with concerns of generalized weakness and body ache.  Resented to the hospital with encephalopathy was found to have severe hypercalcemia and admitted.   Assessment/Plan:  History of hyperparathyroidism with poor outpatient follow-up causing severe hypercalcemia, dehydration, hyponatremia and AKI.  Being treated with IV fluids, renal function calcium levels are improving along with hyponatremia.  Continue hydration.  She has so far received Zometa x1 along with Salman calcitonin SQ.  She has longstanding history of hyperparathyroidism and was supposed to follow with surgeon Dr. Lynford Humphrey but could not.  Will ensure outpatient Endo and surgery follow-up.  PTH levels were 109.  Acute metabolic encephalopathy caused by hypercalcemia.  Head CT stable, no focal deficits or headache, improving with supportive care.  EEG nonacute.  Mentation improving.  UTI.  Rocephin x3 days.  SIRs - at the time of admission no sepsis, this was due to extreme dehydration from hypercalcemia which has improved.  Coag negative staph bacteremia - consistent with contamination.  Monitor.   Bipolar disorder - Holding Risperdal and benztropine secondary to altered mental status presently borderline QTC, psych has evaluated the patient and cleared her for discharge, no medications recommended by psych.  Sustained sinus tachycardia.  Due to dehydration.  Hydrate, TSH stable, low-dose beta-blocker.  CT abdomen pelvis showing adnexal cyst.  Outpatient follow-up with OB for pelvic ultrasound.  Hypocalcemia and hypomagnesemia.  Replaced & stable  Borderline QTC.  Improved after electrolyte replacement..     Status is:  Inpatient  Remains inpatient appropriate because:Persistent severe electrolyte disturbances   Dispo: The patient is from: Home  Anticipated d/c is to: Home  Anticipated d/c date is: 2 days  Patient currently is not medically stable to d/c.   Family Communication:    Message for mother Vaughan Basta on 01/25/2020 on her cell phone at 9:20 AM  Message also left for listed significant other [phone] on 01/26/2020 at 11:12 AM  Consultants:  none  Code Status:  FULL  DVT Prophylaxis:  [place] Heparin    Procedures: As Listed in Progress Note Above  Antibiotics: None  Subjective:  Patient in bed, appears comfortable, denies any headache, no fever, no chest pain or pressure, no shortness of breath , no abdominal pain. No focal weakness.  Objective: Vitals:   01/24/20 2104 01/25/20 0501 01/25/20 2203 01/26/20 0458  BP: 115/80 113/82 98/60 (!) 90/56  Pulse: 92 (!) 101 87 82  Resp: 19 20 20 14   Temp: 98.5 F (36.9 C) 99.2 F (37.3 C) 98.7 F (37.1 C) 98.1 F (36.7 C)  TempSrc: Axillary Axillary Oral Oral  SpO2: 97% 97% 98% 96%  Weight:  56.8 kg    Height:        Intake/Output Summary (Last 24 hours) at 01/26/2020 1111 Last data filed at 01/26/2020 0057 Gross per 24 hour  Intake 1200 ml  Output 200 ml  Net 1000 ml   Weight change:  Exam:  Awake Alert, No new F.N deficits,   [place].AT,PERRAL Supple Neck,No JVD, No cervical lymphadenopathy appriciated.  Symmetrical Chest wall movement, Good air movement bilaterally, CTAB RRR,No Gallops, Rubs or new Murmurs, No Parasternal Heave +ve B.Sounds, Abd Soft, No tenderness, No organomegaly appriciated, No rebound - guarding or rigidity. No Cyanosis, Clubbing or edema, No new Rash  or bruise    Data Reviewed: I have personally reviewed following labs and imaging studies Basic Metabolic Panel: Recent Labs  Lab 01/23/20 1237 01/23/20 1712 01/24/20 0242 01/25/20 0317 01/26/20 0445   NA 168* 161* 155* 147* 144  K 3.8 3.8 4.1 3.4* 4.2  CL >130* >130* >130* 119* 120*  CO2 20* 21* 19* 22 18*  GLUCOSE 132* 172* 117* 126* 96  BUN 76* 63* 39* 16 9  CREATININE 2.14* 1.90* 1.43* 1.17* 1.04*  CALCIUM 12.3* 11.1* 10.3 10.7* 10.0  MG  --   --   --  1.5* 2.0   Liver Function Tests: Recent Labs  Lab 01/22/20 1409 01/24/20 0242 01/25/20 0317 01/26/20 0445  AST 18 34 48* 42*  ALT 19 23 31 27   ALKPHOS 109 77 94 76  BILITOT 1.4* 1.2 1.0 0.5  PROT 8.4* 5.8* 6.4* 5.7*  ALBUMIN 3.0* 2.3* 2.6* 2.3*   No results for input(s): LIPASE, AMYLASE in the last 168 hours. No results for input(s): AMMONIA in the last 168 hours. Coagulation Profile: Recent Labs  Lab 01/22/20 1409  INR 1.5*   CBC: Recent Labs  Lab 01/22/20 1409 01/23/20 0426 01/24/20 0242 01/25/20 0317 01/26/20 0445  WBC 24.7* 24.7* 21.1* 16.0* 10.5  NEUTROABS 19.9*  --   --   --  7.5  HGB 14.9 11.3* 11.0* 10.8* 10.0*  HCT 49.0* 36.8 36.3 33.8* 33.2*  MCV 99.4 101.1* 102.5* 96.3 100.9*  PLT 295 244 221 264 211   Cardiac Enzymes: No results for input(s): CKTOTAL, CKMB, CKMBINDEX, TROPONINI in the last 168 hours. BNP: Invalid input(s): POCBNP CBG: No results for input(s): GLUCAP in the last 168 hours. HbA1C: No results for input(s): HGBA1C in the last 72 hours. Urine analysis:    Component Value Date/Time   COLORURINE AMBER (A) 01/22/2020 1634   APPEARANCEUR HAZY (A) 01/22/2020 1634   APPEARANCEUR Cloudy (A) 02/02/2017 1510   LABSPEC 1.020 01/22/2020 1634   PHURINE 5.0 01/22/2020 1634   GLUCOSEU NEGATIVE 01/22/2020 1634   HGBUR MODERATE (A) 01/22/2020 1634   BILIRUBINUR NEGATIVE 01/22/2020 1634   BILIRUBINUR Negative 02/02/2017 1510   KETONESUR NEGATIVE 01/22/2020 1634   PROTEINUR 30 (A) 01/22/2020 1634   UROBILINOGEN 0.2 03/09/2017 1756   NITRITE POSITIVE (A) 01/22/2020 1634   LEUKOCYTESUR LARGE (A) 01/22/2020 1634   Sepsis Labs: @LABRCNTIP (procalcitonin:4,lacticidven:4) ) Recent  Results (from the past 240 hour(s))  Culture, blood (Routine x 2)     Status: None (Preliminary result)   Collection Time: 01/22/20  2:09 PM   Specimen: BLOOD LEFT FOREARM  Result Value Ref Range Status   Specimen Description BLOOD LEFT FOREARM  Final   Special Requests   Final    BOTTLES DRAWN AEROBIC AND ANAEROBIC Blood Culture adequate volume   Culture   Final    NO GROWTH 3 DAYS Performed at [facility], [place] [address], [place], [place] [address]    Report Status PENDING  Incomplete  Culture, blood (Routine x 2)     Status: Abnormal   Collection Time: 01/22/20  2:24 PM   Specimen: BLOOD  Result Value Ref Range Status   Specimen Description BLOOD LEFT ANTECUBITAL  Final   Special Requests   Final    BOTTLES DRAWN AEROBIC ONLY Blood Culture results may not be optimal due to an excessive volume of blood received in culture bottles   Culture  Setup Time   Final    GRAM POSITIVE COCCI IN CLUSTERS AEROBIC BOTTLE ONLY CRITICAL RESULT  CALLED TO, READ BACK BY AND VERIFIED WITH: Andres Shad, PHARMD AT 1027 01/23/20 BY L BENFIELD    Culture (A)  Final    STAPHYLOCOCCUS SPECIES (COAGULASE NEGATIVE) THE SIGNIFICANCE OF ISOLATING THIS ORGANISM FROM A SINGLE SET OF BLOOD CULTURES WHEN MULTIPLE SETS ARE DRAWN IS UNCERTAIN. PLEASE NOTIFY THE MICROBIOLOGY DEPARTMENT WITHIN ONE WEEK IF SPECIATION AND SENSITIVITIES ARE REQUIRED. Performed at [facility], [place] [address] [place], [place] [address]    Report Status 01/24/2020 FINAL  Final  Blood Culture ID Panel (Reflexed)     Status: Abnormal   Collection Time: 01/22/20  2:24 PM  Result Value Ref Range Status   Enterococcus species NOT DETECTED NOT DETECTED Final   Listeria monocytogenes NOT DETECTED NOT DETECTED Final   Staphylococcus species DETECTED (A) NOT DETECTED Final    Comment: Methicillin (oxacillin) susceptible coagulase negative staphylococcus. Possible blood culture contaminant (unless isolated from more than one blood  culture draw or clinical case suggests pathogenicity). No antibiotic treatment is indicated for blood  culture contaminants. CRITICAL RESULT CALLED TO, READ BACK BY AND VERIFIED WITH: Andres Shad, PHARMD AT 1027 01/23/20 BY L BENFIELD    Staphylococcus aureus (BCID) NOT DETECTED NOT DETECTED Final   Methicillin resistance NOT DETECTED NOT DETECTED Final   Streptococcus species NOT DETECTED NOT DETECTED Final   Streptococcus agalactiae NOT DETECTED NOT DETECTED Final   Streptococcus pneumoniae NOT DETECTED NOT DETECTED Final   Streptococcus pyogenes NOT DETECTED NOT DETECTED Final   Acinetobacter baumannii NOT DETECTED NOT DETECTED Final   Enterobacteriaceae species NOT DETECTED NOT DETECTED Final   Enterobacter cloacae complex NOT DETECTED NOT DETECTED Final   Escherichia coli NOT DETECTED NOT DETECTED Final   Klebsiella oxytoca NOT DETECTED NOT DETECTED Final   Klebsiella pneumoniae NOT DETECTED NOT DETECTED Final   Proteus species NOT DETECTED NOT DETECTED Final   Serratia marcescens NOT DETECTED NOT DETECTED Final   Haemophilus influenzae NOT DETECTED NOT DETECTED Final   Neisseria meningitidis NOT DETECTED NOT DETECTED Final   Pseudomonas aeruginosa NOT DETECTED NOT DETECTED Final   Candida albicans NOT DETECTED NOT DETECTED Final   Candida glabrata NOT DETECTED NOT DETECTED Final   Candida krusei NOT DETECTED NOT DETECTED Final   Candida parapsilosis NOT DETECTED NOT DETECTED Final   Candida tropicalis NOT DETECTED NOT DETECTED Final    Comment: Performed at [facility], [address] [place], [place] [address]  SARS Coronavirus 2 by RT PCR (hospital order, performed in [facility] hospital lab) Nasopharyngeal Nasopharyngeal Swab     Status: None   Collection Time: 01/22/20  4:35 PM   Specimen: Nasopharyngeal Swab  Result Value Ref Range Status   SARS Coronavirus 2 NEGATIVE NEGATIVE Final    Comment: (NOTE) SARS-CoV-2 target nucleic acids are NOT DETECTED.  The SARS-CoV-2  RNA is generally detectable in upper and lower respiratory specimens during the acute phase of infection. The lowest concentration of SARS-CoV-2 viral copies this assay can detect is 250 copies / mL. A negative result does not preclude SARS-CoV-2 infection and should not be used as the sole basis for treatment or other patient management decisions.  A negative result may occur with improper specimen collection / handling, submission of specimen other than nasopharyngeal swab, presence of viral mutation(s) within the areas targeted by this assay, and inadequate number of viral copies (<250 copies / mL). A negative result must be combined with clinical observations, patient history, and epidemiological information.  Fact Sheet for Patients:  StrictlyIdeas.no  Fact Sheet for Healthcare Providers: BankingDealers.co.za  This test is not yet approved or  cleared by the [place] FDA and has been authorized for detection and/or diagnosis of SARS-CoV-2 by FDA under an Emergency Use Authorization (EUA).  This EUA will remain in effect (meaning this test can be used) for the duration of the COVID-19 declaration under Section 564(b)(1) of the Act, 21 U.S.C. section 360bbb-3(b)(1), unless the authorization is terminated or revoked sooner.  Performed at [facility], [place] [address] [place], [place] [address]      Scheduled Meds:  heparin  5,000 Units Subcutaneous Q8H   metoprolol tartrate  50 mg Oral BID   potassium chloride  40 mEq Oral Once   Continuous Infusions:  cefTRIAXone (ROCEPHIN)  IV 1 g (01/26/20 0057)   lactated ringers 100 mL/hr at 01/26/20 0900    Procedures/Studies: CT ABDOMEN PELVIS WO CONTRAST  Result Date: 01/22/2020 CLINICAL DATA:  Change in mental status question of sepsis EXAM: CT ABDOMEN AND PELVIS WITHOUT CONTRAST TECHNIQUE: Multidetector CT imaging of the abdomen and pelvis was performed following  the standard protocol without IV contrast. COMPARISON:  July 17, 2019 FINDINGS: Cardiovascular: There are no significant vascular findings. The heart size is normal. There is no pericardial thickening or effusion. Mediastinum/Nodes: There are no enlarged mediastinal, hilar or axillary lymph nodes. The thyroid gland, trachea and esophagus demonstrate no significant findings. Lungs/Pleura: There are no areas consolidation. No suspicious pulmonary nodules. There is no pleural effusion. Upper abdomen: The visualized portion of the upper abdomen is unremarkable. Musculoskeletal/Chest wall: There is no chest wall mass or suspicious osseous finding. No acute osseous abnormality Abdomen/pelvis: Lower chest: The visualized heart size within normal limits. No pericardial fluid/thickening. No hiatal hernia. The visualized portions of the lungs are clear. Hepatobiliary: The liver is normal in density without focal abnormality.The main portal vein is patent. No evidence of calcified gallstones, gallbladder wall thickening or biliary dilatation. Pancreas: Unremarkable. No pancreatic ductal dilatation or surrounding inflammatory changes. Spleen: Normal in size without focal abnormality. Adrenals/Urinary Tract: Both adrenal glands appear normal. There is a 9 mm calculus seen in lower pole the right kidney. No collecting system calculi are seen. The bladder is unremarkable. The left kidney is unremarkable. Bladder is unremarkable. Stomach/Bowel: The stomach, small bowel, and colon are normal in appearance. No inflammatory changes, wall thickening, or obstructive findings.The patient is status post small bowel anastomosis in the quadrant. Vascular/Lymphatic: There are no enlarged mesenteric, retroperitoneal, or pelvic lymph nodes. No significant vascular findings are present. Reproductive: Thin the left adnexa there is a fluid and hyperdense probable blood containing lesion measuring 4.2 cm which could represent a large complex  hemorrhagic cyst. The uterus is grossly unremarkable. Other: No evidence of abdominal wall mass or hernia. Musculoskeletal: No acute or significant osseous findings. IMPRESSION: No acute intrathoracic pathology. Nonobstructing 9 mm right renal calculus. Large 4.2 cm complex hyperdense lesion within the left adnexa which could represent a endometrioma/hemorrhagic cyst. If further evaluation is required would recommend pelvic ultrasound. Electronically Signed   By: Prudencio Pair M.D.   On: 01/22/2020 17:03   DG Chest 1 View  Result Date: 01/22/2020 CLINICAL DATA:  Generalized weakness EXAM: CHEST  1 VIEW COMPARISON:  None. FINDINGS: The heart size and mediastinal contours are within normal limits. Both lungs are clear. The visualized skeletal structures are unremarkable. IMPRESSION: No active disease. Electronically Signed   By: Prudencio Pair M.D.   On: 01/22/2020 16:21   CT Head Wo  Contrast  Result Date: 01/22/2020 CLINICAL DATA:  Generalized weakness for 1 week with change in mental status, non communicative. EXAM: CT HEAD WITHOUT CONTRAST TECHNIQUE: Contiguous axial images were obtained from the base of the skull through the vertex without intravenous contrast. COMPARISON:  None. FINDINGS: Brain: No evidence of acute infarction, hemorrhage, hydrocephalus, extra-axial collection or mass lesion/mass effect. Vascular: No hyperdense vessel or unexpected calcification. Skull: No calvarial fracture or suspicious osseous lesion. No scalp swelling or hematoma. Sinuses/Orbits: Paranasal sinuses and mastoid air cells are predominantly clear. Pneumatization of the petrous apices and squamosal temporal bones. Included orbital structures are unremarkable. Other: None IMPRESSION: No acute intracranial findings. Electronically Signed   By: Lovena Le M.D.   On: 01/22/2020 15:28   CT CHEST WO CONTRAST  Result Date: 01/22/2020 CLINICAL DATA:  Change in mental status question of sepsis EXAM: CT ABDOMEN AND PELVIS WITHOUT  CONTRAST TECHNIQUE: Multidetector CT imaging of the abdomen and pelvis was performed following the standard protocol without IV contrast. COMPARISON:  July 17, 2019 FINDINGS: Cardiovascular: There are no significant vascular findings. The heart size is normal. There is no pericardial thickening or effusion. Mediastinum/Nodes: There are no enlarged mediastinal, hilar or axillary lymph nodes. The thyroid gland, trachea and esophagus demonstrate no significant findings. Lungs/Pleura: There are no areas consolidation. No suspicious pulmonary nodules. There is no pleural effusion. Upper abdomen: The visualized portion of the upper abdomen is unremarkable. Musculoskeletal/Chest wall: There is no chest wall mass or suspicious osseous finding. No acute osseous abnormality Abdomen/pelvis: Lower chest: The visualized heart size within normal limits. No pericardial fluid/thickening. No hiatal hernia. The visualized portions of the lungs are clear. Hepatobiliary: The liver is normal in density without focal abnormality.The main portal vein is patent. No evidence of calcified gallstones, gallbladder wall thickening or biliary dilatation. Pancreas: Unremarkable. No pancreatic ductal dilatation or surrounding inflammatory changes. Spleen: Normal in size without focal abnormality. Adrenals/Urinary Tract: Both adrenal glands appear normal. There is a 9 mm calculus seen in lower pole the right kidney. No collecting system calculi are seen. The bladder is unremarkable. The left kidney is unremarkable. Bladder is unremarkable. Stomach/Bowel: The stomach, small bowel, and colon are normal in appearance. No inflammatory changes, wall thickening, or obstructive findings.The patient is status post small bowel anastomosis in the quadrant. Vascular/Lymphatic: There are no enlarged mesenteric, retroperitoneal, or pelvic lymph nodes. No significant vascular findings are present. Reproductive: Thin the left adnexa there is a fluid and  hyperdense probable blood containing lesion measuring 4.2 cm which could represent a large complex hemorrhagic cyst. The uterus is grossly unremarkable. Other: No evidence of abdominal wall mass or hernia. Musculoskeletal: No acute or significant osseous findings. IMPRESSION: No acute intrathoracic pathology. Nonobstructing 9 mm right renal calculus. Large 4.2 cm complex hyperdense lesion within the left adnexa which could represent a endometrioma/hemorrhagic cyst. If further evaluation is required would recommend pelvic ultrasound. Electronically Signed   By: Prudencio Pair M.D.   On: 01/22/2020 17:03    Lala Lund, DO  [facility]  If 7PM-7AM, please contact night-coverage [email or web address] Password [email or web address] 01/26/2020, 11:11 AM   LOS: 4 days

## 2020-01-26 NOTE — TOC Initial Note (Signed)
Transition of Care [facility]) - Initial/Assessment Note    Patient Details  Name: Theresa Mccoy MRN: [id number] Date of Birth: 12-29-1980  Transition of Care [facility]) CM/SW Contact:    Verdell Carmine, RN Phone Number: 01/26/2020, 12:19 PM  Clinical Narrative:                 Initially admitted with septic picture, electrolyte instability  High calcium. Encephalopathy.  Hydrated decreased use of psychotropics, confusion and encephalopaty lessened. - Hospitalization continued due to continued electrolyte imbalance.  Continuing hydration and electrolyte checks.  Lives with So and has mother as point of contact.  Expected Discharge Plan: Home/Self Care     Patient Goals and CMS Choice        Expected Discharge Plan and Services Expected Discharge Plan: Home/Self Care   Discharge Planning Services: CM Consult                                          Prior Living Arrangements/Services   Lives with:: Significant Other Patient language and need for interpreter reviewed:: Yes        Need for Family Participation in Patient Care: Yes (Comment) Care giver support system in place?: Yes (comment)   Criminal Activity/Legal Involvement Pertinent to Current Situation/Hospitalization: No - Comment as needed  Activities of Daily Living      Permission Sought/Granted                  Emotional Assessment       Orientation: : Oriented to Self Alcohol / Substance Use: Not Applicable Psych Involvement: No (comment)  Admission diagnosis:  Dehydration [E86.0] Hypernatremia [E87.0] Weakness [R53.1] Sepsis ([place]) [A41.9] Acute renal failure, unspecified acute renal failure type ([place]) [N17.9] AMS (altered mental status) [R41.82] Sepsis, due to unspecified organism, unspecified whether acute organ dysfunction present [facility]) [A41.9] Patient Active Problem List   Diagnosis Date Noted  . Sepsis ([place]) 01/22/2020  . Hypotension 01/22/2020  . Hypernatremia 01/22/2020  . AKI  (acute kidney injury) ([place]) 01/22/2020  . Adnexal mass 01/22/2020  . Renal calculus, right 01/22/2020  . Hypercalcemia 07/07/2019  . Primary hyperparathyroidism ([place]) 07/07/2019  . Urinary tract infection 07/02/2019  . SBO (small bowel obstruction) ([place]) 07/02/2019  . Supervision of high risk pregnancy, antepartum 05/17/2019  . Muscle cramps 02/02/2017  . Bipolar disorder, current episode mixed, moderate ([place]) 02/02/2017  . Generalized abdominal pain 02/02/2017  . Generalized anxiety disorder 02/02/2017  . Essential hypertension 02/02/2017   PCP:  Kerin Perna, NP Pharmacy:   [facility] [place], [place] [address] Phone: [phone] Fax: [phone]     Social Determinants of Health (SDOH) Interventions    Readmission Risk Interventions No flowsheet data found.

## 2020-01-27 DIAGNOSIS — E860 Dehydration: Secondary | ICD-10-CM

## 2020-01-27 LAB — COMPREHENSIVE METABOLIC PANEL
ALT: 31 U/L (ref 0–44)
AST: 39 U/L (ref 15–41)
Albumin: 2 g/dL — ABNORMAL LOW (ref 3.5–5.0)
Alkaline Phosphatase: 78 U/L (ref 38–126)
Anion gap: 5 (ref 5–15)
BUN: 6 mg/dL (ref 6–20)
CO2: 21 mmol/L — ABNORMAL LOW (ref 22–32)
Calcium: 9 mg/dL (ref 8.9–10.3)
Chloride: 119 mmol/L — ABNORMAL HIGH (ref 98–111)
Creatinine, Ser: 0.81 mg/dL (ref 0.44–1.00)
GFR calc Af Amer: >60 mL/min (ref >60)
GFR calc non Af Amer: >60 mL/min (ref >60)
Glucose, Bld: 92 mg/dL (ref 70–99)
Potassium: 3.5 mmol/L (ref 3.5–5.1)
Sodium: 145 mmol/L (ref 135–145)
Total Bilirubin: 0.7 mg/dL (ref 0.3–1.2)
Total Protein: 5.2 g/dL — ABNORMAL LOW (ref 6.5–8.1)

## 2020-01-27 LAB — CBC WITH DIFFERENTIAL/PLATELET
Abs Immature Granulocytes: 0.04 10*3/uL (ref 0.00–0.07)
Basophils Absolute: 0 10*3/uL (ref 0.0–0.1)
Basophils Relative: 0 %
Eosinophils Absolute: 0.1 10*3/uL (ref 0.0–0.5)
Eosinophils Relative: 1 %
HCT: 26.7 % — ABNORMAL LOW (ref 36.0–46.0)
Hemoglobin: 8.7 g/dL — ABNORMAL LOW (ref 12.0–15.0)
Immature Granulocytes: 1 %
Lymphocytes Relative: 24 %
Lymphs Abs: 1.8 10*3/uL (ref 0.7–4.0)
MCH: 31.5 pg (ref 26.0–34.0)
MCHC: 32.6 g/dL (ref 30.0–36.0)
MCV: 96.7 fL (ref 80.0–100.0)
Monocytes Absolute: 0.5 10*3/uL (ref 0.1–1.0)
Monocytes Relative: 7 %
Neutro Abs: 4.8 10*3/uL (ref 1.7–7.7)
Neutrophils Relative %: 67 %
Platelets: 217 10*3/uL (ref 150–400)
RBC: 2.76 MIL/uL — ABNORMAL LOW (ref 3.87–5.11)
RDW: 13.3 % (ref 11.5–15.5)
WBC: 7.3 10*3/uL (ref 4.0–10.5)
nRBC: 0 % (ref 0.0–0.2)

## 2020-01-27 LAB — CULTURE, BLOOD (ROUTINE X 2)
Culture: NO GROWTH
Special Requests: ADEQUATE

## 2020-01-27 LAB — PROCALCITONIN: Procalcitonin: 0.8 ng/mL

## 2020-01-27 LAB — MAGNESIUM: Magnesium: 1.6 mg/dL — ABNORMAL LOW (ref 1.7–2.4)

## 2020-01-27 LAB — C-REACTIVE PROTEIN: CRP: 1.6 mg/dL — ABNORMAL HIGH (ref <1.0)

## 2020-01-27 LAB — BRAIN NATRIURETIC PEPTIDE: B Natriuretic Peptide: 87.1 pg/mL (ref 0.0–100.0)

## 2020-01-27 MED ORDER — MAGNESIUM SULFATE 2 GM/50ML IV SOLN
2.0000 g | Freq: Once | INTRAVENOUS | Status: AC
  Administered 2020-01-27: 2 g via INTRAVENOUS
  Filled 2020-01-27: qty 50

## 2020-01-27 MED ORDER — LACTATED RINGERS IV BOLUS
1000.0000 mL | Freq: Once | INTRAVENOUS | Status: AC
  Administered 2020-01-27: 1000 mL via INTRAVENOUS

## 2020-01-27 NOTE — Progress Notes (Signed)
Patient refusing heparin sq, Dr.Chotiner ordered to give pt SCD, patient refused SCD verbalized she does ot want anthing wrap around her legs. Patient was educated but she refused

## 2020-01-27 NOTE — Progress Notes (Signed)
Physical Therapy Treatment Patient Details Name: Theresa Mccoy MRN: [id number] DOB: Feb 12, 1981 Today's Date: 01/27/2020    History of Present Illness Pt is a 39 y.o. female admitted to ED on 01/22/20 with weakness, body aches, and AMS secondary to UTI. CT abdomen reveals non-obstructive 9 mm R renal calculus, incidental finding of 4.2 cm complex hyperdense lesion within the left adnexa that could represent a endometrioma/hemorrhagic cyst. 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   PT Comments    Pt preparing for d/c home today. Trialled gait training with RW, stability much improved; recommend rollator for energy conservation and stability. Pt reports husband walking to hospital to give clothes so they can walk home at d/c (live nearby). Spoke with CM requesting cab voucher for pt and husband.   Follow Up Recommendations  No PT follow up;Supervision for mobility/OOB;     Equipment Recommendations  Other (comment) (rollator)    Recommendations for Other Services       Precautions / Restrictions Precautions Precautions: Fall;Other (comment) Precaution Comments: urinary incontinence Restrictions Weight Bearing Restrictions: No    Mobility  Bed Mobility Overal bed mobility: Modified Independent             General bed mobility comments: HOB elevated  Transfers Overall transfer level: Needs assistance Equipment used: Rolling walker (2 wheeled) Transfers: Sit to/from Stand Sit to Stand: Supervision            Ambulation/Gait Ambulation/Gait assistance: Supervision Gait Distance (Feet): 50 Feet Assistive device: Rolling walker (2 wheeled) Gait Pattern/deviations: Step-through pattern;Decreased stride length Gait velocity: Decreased   General Gait Details: Agreeable to in-room ambulation and trial with RW; pt reports stability much improved with UE support; no overt LOB or  instability   Stairs             Wheelchair Mobility    Modified Rankin (Stroke Patients Only)       Balance Overall balance assessment: Needs assistance;History of Falls Sitting-balance support: No upper extremity supported Sitting balance-Leahy Scale: Good       Standing balance-Leahy Scale: Fair Standing balance comment: Can static stand without UE support                            Cognition Arousal/Alertness: Awake/alert Behavior During Therapy: WFL for tasks assessed/performed Overall Cognitive Status: No family/caregiver present to determine baseline cognitive functioning Area of Impairment: Attention;Following commands;Safety/judgement;Awareness;Problem solving                   Current Attention Level: Sustained;Selective   Following Commands: Follows one step commands consistently;Follows multi-step commands inconsistently Safety/Judgement: Decreased awareness of deficits;Decreased awareness of safety Awareness: Emergent Problem Solving: Requires verbal cues General Comments: Potentially distracted as husband on speaker phone entire session. Easily distractable requiring cues to task. Poor insight into deficits      Exercises      General Comments        Pertinent Vitals/Pain Pain Assessment: Faces Faces Pain Scale: Hurts a little bit Pain Location: Stomach "from those IV fluids" Pain Descriptors / Indicators: Discomfort Pain Intervention(s): Monitored during session    Home Living                      Prior Function            PT Goals (current goals can now be found in the care plan section) Progress towards  PT goals: Progressing toward goals    Frequency    Min 3X/week      PT Plan Discharge plan needs to be updated    Co-evaluation              AM-PAC PT "6 Clicks" Mobility   Outcome Measure  Help needed turning from your back to your side while in a flat bed without using bedrails?:  None Help needed moving from lying on your back to sitting on the side of a flat bed without using bedrails?: None Help needed moving to and from a bed to a chair (including a wheelchair)?: None Help needed standing up from a chair using your arms (e.g., wheelchair or bedside chair)?: None Help needed to walk in hospital room?: A Little Help needed climbing 3-5 steps with a railing? : A Little 6 Click Score: 22    End of Session   Activity Tolerance: Patient tolerated treatment well Patient left: in chair;with call bell/phone within reach;with bed alarm set Nurse Communication: Mobility status PT Visit Diagnosis: Other abnormalities of gait and mobility (R26.89);Muscle weakness (generalized) (M62.81)     Time: 2162-4469 PT Time Calculation (min) (ACUTE ONLY): 15 min  Charges:  $Gait Training: 8-22 mins                    Mabeline Caras, PT, DPT Acute Rehabilitation Services  Pager [phone] Office [place] 01/27/2020, 2:38 PM

## 2020-01-27 NOTE — Discharge Instructions (Signed)
Follow with Primary MD Kerin Perna, NP in 7 days   Get CBC, CMP, PTH, TSH, 2 view Chest X ray -  checked next visit within 1 week by Primary MD    Activity: As tolerated with Full fall precautions use walker/cane & assistance as needed  Disposition Home   Diet: Heart Healthy    Special Instructions: If you have smoked or chewed Tobacco  in the last 2 yrs please stop smoking, stop any regular Alcohol  and or any Recreational drug use.  On your next visit with your primary care physician please Get Medicines reviewed and adjusted.  Please request your Prim.MD to go over all Hospital Tests and Procedure/Radiological results at the follow up, please get all Hospital records sent to your Prim MD by signing hospital release before you go home.  If you experience worsening of your admission symptoms, develop shortness of breath, life threatening emergency, suicidal or homicidal thoughts you must seek medical attention immediately by calling 911 or calling your MD immediately  if symptoms less severe.  You Must read complete instructions/literature along with all the possible adverse reactions/side effects for all the Medicines you take and that have been prescribed to you. Take any new Medicines after you have completely understood and accpet all the possible adverse reactions/side effects.

## 2020-01-27 NOTE — TOC Transition Note (Signed)
Transition of Care [facility]) - CM/SW Discharge Note   Patient Details  Name: Yarelli Decelles MRN: [id number] Date of Birth: [birth year]  [facility] CM/SW Contact:  Verdell Carmine, RN Phone Number: 01/27/2020, 3:16 PM   Clinical Narrative:     Patient being discharged PT recommended Rollator, called [employer] to see about getting it through [facility]. Patient does not have any extra money for purchase if there are extra costs. Does not have vehicle, will offer her ride home. Per CSW Husband walking here to bring her clothes.   Final next level of care: Home/Self Care Barriers to Discharge: No Barriers Identified   Patient Goals and CMS Choice Patient states their goals for this hospitalization and ongoing recovery are:: Go Home   Choice offered to / list presented to : Patient  Discharge Placement             Home self care          Discharge Plan and Services   Discharge Planning Services: CM Consult            DME Arranged: Walker rolling with seat DME Agency: [employer] Date DME Agency Contacted: 01/27/20 Time DME Agency Contacted: 1500 Representative spoke with at DME Agency: [place] ([place]) Interventions     Readmission Risk Interventions No flowsheet data found.

## 2020-01-27 NOTE — Discharge Summary (Signed)
Theresa Mccoy DJT:[id number] DOB: Jan 20, 1981 DOA: 01/22/2020  PCP: Kerin Perna, NP  Admit date: 01/22/2020  Discharge date: 01/27/2020  Admitted From: Home   Disposition:  Home   Recommendations for Outpatient Follow-up:   Follow up with PCP in 1-2 weeks  PCP Please obtain BMP/CBC, 2 view CXR in 1week,  (see Discharge instructions)   PCP Please follow up on the following pending results: follow CBC, CMP, Magnesium, needs outpt Psych, Endo follow up within 1 week   Home Health:  PT, RN Equipment/Devices: Rolling walker  Consultations: Psych Discharge Condition: Stable    CODE STATUS: Full    Diet Recommendation: Heart Healthy   Diet Order            Diet - low sodium heart healthy           Diet Heart Room service appropriate? Yes; Fluid consistency: Thin  Diet effective now                  Chief Complaint  Patient presents with  . Weakness  . Altered Mental Status     Brief history of present illness from the day of admission and additional interim summary    39 y.o.femalewith medical history significant forhistory of SBO s/p resection(07/2019),hypertension, hyperparathyroidism, bipolar anxiety who presents with concerns of generalized weakness and body ache.  Resented to the hospital with encephalopathy was found to have severe hypercalcemia and admitted.                                                                  Hospital Course   History of hyperparathyroidism with poor outpatient follow-up causing severe hypercalcemia, dehydration, hyponatremia and AKI.    He was treated aggressively with IV fluids, 1 dose of Zometa and Solman calcitonin shot, her corrected calcium levels have normalized, stable creatinine.  She has longstanding history of hyperparathyroidism and was supposed  to follow with surgeon Dr. Lynford Humphrey but could not.  Have counseled her not to skip her outpatient Endo and surgery follow-up.  PTH levels were 109.  We will also follow with PCP, request PCP to please make sure patient follows appropriately with the subspecialist.  Acute metabolic encephalopathy caused by hypercalcemia.  Head CT stable, no focal deficits or headache, improving with supportive care.  EEG nonacute.  Mentation now at baseline.  UTI.  Finished Rocephin Rx.  SIRs - at the time of admission no sepsis, this was due to extreme dehydration from hypercalcemia which has improved.  Coag negative staph bacteremia - consistent with contamination.  Monitor.   Bipolar disorder - Holding Risperdal and Benztropine secondary to altered mental status presently borderline QTC, psych has evaluated the patient and cleared her for discharge, no medications recommended by psych.  Sustained sinus tachycardia.  Due to dehydration.  Stable TSH resolved after IV fluids.  CT abdomen pelvis showing adnexal cyst.  Outpatient follow-up with OB for pelvic ultrasound.  Hypomagnesemia.  Replaced .  Borderline QTC.  Improved after electrolyte replacement.   Discharge diagnosis     Principal Problem:   Sepsis ([place]) Active Problems:   Bipolar disorder, current episode mixed, moderate ([facility])   Hypercalcemia   Hypotension   Hypernatremia   AKI (acute kidney injury) ([place])   Adnexal mass   Renal calculus, right    Discharge instructions    Discharge Instructions    Diet - low sodium heart healthy   Complete by: As directed    Discharge instructions   Complete by: As directed    Follow with Primary MD Kerin Perna, NP in 7 days   Get CBC, CMP, PTH, TSH, 2 view Chest X ray -  checked next visit within 1 week by Primary MD    Activity: As tolerated with Full fall precautions use walker/cane & assistance as needed  Disposition Home   Diet: Heart Healthy    Special Instructions:  If you have smoked or chewed Tobacco  in the last 2 yrs please stop smoking, stop any regular Alcohol  and or any Recreational drug use.  On your next visit with your primary care physician please Get Medicines reviewed and adjusted.  Please request your Prim.MD to go over all Hospital Tests and Procedure/Radiological results at the follow up, please get all Hospital records sent to your Prim MD by signing hospital release before you go home.  If you experience worsening of your admission symptoms, develop shortness of breath, life threatening emergency, suicidal or homicidal thoughts you must seek medical attention immediately by calling 911 or calling your MD immediately  if symptoms less severe.  You Must read complete instructions/literature along with all the possible adverse reactions/side effects for all the Medicines you take and that have been prescribed to you. Take any new Medicines after you have completely understood and accpet all the possible adverse reactions/side effects.   Increase activity slowly   Complete by: As directed       Discharge Medications   Allergies as of 01/27/2020      Reactions   Codeine Other (See Comments)   Constipation      Medication List    STOP taking these medications   benztropine 0.5 MG tablet Commonly known as: COGENTIN   multivitamin with minerals Tabs tablet   risperiDONE microspheres 50 MG injection Commonly known as: RISPERDAL CONSTA     TAKE these medications   acetaminophen 325 MG tablet Commonly known as: TYLENOL Take 2 tablets (650 mg total) by mouth every 6 (six) hours as needed for mild pain.   Blood Pressure Monitor Automat Devi 1 Device by Does not apply route daily. Automatic blood pressure cuff with regular cuff. Blood pressure to be monitored regularly at home. ICD-10 code: O09.90.   gabapentin 300 MG capsule Commonly known as: NEURONTIN Take 1 capsule (300 mg total) by mouth 3 (three) times daily.     ketoconazole 2 % shampoo Commonly known as: NIZORAL Apply 1 application topically daily as needed for itching.   risperiDONE 2 MG tablet Commonly known as: RISPERDAL 1 in am 2 at hs What changed:   how much to take  how to take this  when to take this  additional instructions   WesTab Plus 27-1 MG Tabs Take 1 tablet by mouth daily.  Durable Medical Equipment  (From admission, onward)         Start     Ordered   01/27/20 1400  For home use only DME Walker rolling  Once       Comments: 5 wheel  Question Answer Comment  Walker: With 5 Inch Wheels   Patient needs a walker to treat with the following condition Weakness      01/27/20 1359           Follow-up Information    Kerin Perna, NP. Schedule an appointment as soon as possible for a visit in 1 week(s).   Specialty: Internal Medicine Contact information: [place] [address] [place]. Schedule an appointment as soon as possible for a visit in 1 week(s).   Specialty: Internal Medicine Contact information: [address] [id number] [place]       Armandina Gemma, MD. Schedule an appointment as soon as possible for a visit in 1 week(s).   Specialty: General Surgery Why: Hyperparathyroidism. Contact information: [address] [place] [address] [phone]        Renato Shin, MD. Schedule an appointment as soon as possible for a visit in 1 week(s).   Specialty: Endocrinology Why: Hyperparathyroidism with hypercalcemia Contact information: [address] [place] [address] [place]. Schedule an appointment as soon as possible for a visit in 1 week(s).   Specialty: Behavioral Health Why:  bipolar disorder Contact information: [place] [phone]              Major procedures and Radiology Reports - PLEASE review detailed and final reports thoroughly  -       CT ABDOMEN PELVIS WO CONTRAST  Result Date: 01/22/2020 CLINICAL DATA:  Change in mental status question of sepsis EXAM: CT ABDOMEN AND PELVIS WITHOUT CONTRAST TECHNIQUE: Multidetector CT imaging of the abdomen and pelvis was performed following the standard protocol without IV contrast. COMPARISON:  July 17, 2019 FINDINGS: Cardiovascular: There are no significant vascular findings. The heart size is normal. There is no pericardial thickening or effusion. Mediastinum/Nodes: There are no enlarged mediastinal, hilar or axillary lymph nodes. The thyroid gland, trachea and esophagus demonstrate no significant findings. Lungs/Pleura: There are no areas consolidation. No suspicious pulmonary nodules. There is no pleural effusion. Upper abdomen: The visualized portion of the upper abdomen is unremarkable. Musculoskeletal/Chest wall: There is no chest wall mass or suspicious osseous finding. No acute osseous abnormality Abdomen/pelvis: Lower chest: The visualized heart size within normal limits. No pericardial fluid/thickening. No hiatal hernia. The visualized portions of the lungs are clear. Hepatobiliary: The liver is normal in density without focal abnormality.The main portal vein is patent. No evidence of calcified gallstones, gallbladder wall thickening or biliary dilatation. Pancreas: Unremarkable. No pancreatic ductal dilatation or surrounding inflammatory changes. Spleen: Normal in size without focal abnormality. Adrenals/Urinary Tract: Both adrenal glands appear normal. There is a 9 mm calculus seen in lower pole the right kidney. No collecting system calculi are seen. The bladder is unremarkable. The left kidney is unremarkable. Bladder is unremarkable. Stomach/Bowel: The stomach, small bowel, and colon are normal in appearance. No inflammatory changes,  wall thickening, or obstructive findings.The patient is status post small bowel anastomosis in the quadrant. Vascular/Lymphatic: There are no  enlarged mesenteric, retroperitoneal, or pelvic lymph nodes. No significant vascular findings are present. Reproductive: Thin the left adnexa there is a fluid and hyperdense probable blood containing lesion measuring 4.2 cm which could represent a large complex hemorrhagic cyst. The uterus is grossly unremarkable. Other: No evidence of abdominal wall mass or hernia. Musculoskeletal: No acute or significant osseous findings. IMPRESSION: No acute intrathoracic pathology. Nonobstructing 9 mm right renal calculus. Large 4.2 cm complex hyperdense lesion within the left adnexa which could represent a endometrioma/hemorrhagic cyst. If further evaluation is required would recommend pelvic ultrasound. Electronically Signed   By: Prudencio Pair M.D.   On: 01/22/2020 17:03   DG Chest 1 View  Result Date: 01/22/2020 CLINICAL DATA:  Generalized weakness EXAM: CHEST  1 VIEW COMPARISON:  None. FINDINGS: The heart size and mediastinal contours are within normal limits. Both lungs are clear. The visualized skeletal structures are unremarkable. IMPRESSION: No active disease. Electronically Signed   By: Prudencio Pair M.D.   On: 01/22/2020 16:21   CT Head Wo Contrast  Result Date: 01/22/2020 CLINICAL DATA:  Generalized weakness for 1 week with change in mental status, non communicative. EXAM: CT HEAD WITHOUT CONTRAST TECHNIQUE: Contiguous axial images were obtained from the base of the skull through the vertex without intravenous contrast. COMPARISON:  None. FINDINGS: Brain: No evidence of acute infarction, hemorrhage, hydrocephalus, extra-axial collection or mass lesion/mass effect. Vascular: No hyperdense vessel or unexpected calcification. Skull: No calvarial fracture or suspicious osseous lesion. No scalp swelling or hematoma. Sinuses/Orbits: Paranasal sinuses and mastoid air cells are  predominantly clear. Pneumatization of the petrous apices and squamosal temporal bones. Included orbital structures are unremarkable. Other: None IMPRESSION: No acute intracranial findings. Electronically Signed   By: Lovena Le M.D.   On: 01/22/2020 15:28   CT CHEST WO CONTRAST  Result Date: 01/22/2020 CLINICAL DATA:  Change in mental status question of sepsis EXAM: CT ABDOMEN AND PELVIS WITHOUT CONTRAST TECHNIQUE: Multidetector CT imaging of the abdomen and pelvis was performed following the standard protocol without IV contrast. COMPARISON:  July 17, 2019 FINDINGS: Cardiovascular: There are no significant vascular findings. The heart size is normal. There is no pericardial thickening or effusion. Mediastinum/Nodes: There are no enlarged mediastinal, hilar or axillary lymph nodes. The thyroid gland, trachea and esophagus demonstrate no significant findings. Lungs/Pleura: There are no areas consolidation. No suspicious pulmonary nodules. There is no pleural effusion. Upper abdomen: The visualized portion of the upper abdomen is unremarkable. Musculoskeletal/Chest wall: There is no chest wall mass or suspicious osseous finding. No acute osseous abnormality Abdomen/pelvis: Lower chest: The visualized heart size within normal limits. No pericardial fluid/thickening. No hiatal hernia. The visualized portions of the lungs are clear. Hepatobiliary: The liver is normal in density without focal abnormality.The main portal vein is patent. No evidence of calcified gallstones, gallbladder wall thickening or biliary dilatation. Pancreas: Unremarkable. No pancreatic ductal dilatation or surrounding inflammatory changes. Spleen: Normal in size without focal abnormality. Adrenals/Urinary Tract: Both adrenal glands appear normal. There is a 9 mm calculus seen in lower pole the right kidney. No collecting system calculi are seen. The bladder is unremarkable. The left kidney is unremarkable. Bladder is unremarkable.  Stomach/Bowel: The stomach, small bowel, and colon are normal in appearance. No inflammatory changes, wall thickening, or obstructive findings.The patient is status post small bowel anastomosis in the quadrant. Vascular/Lymphatic: There are no enlarged mesenteric, retroperitoneal, or pelvic lymph nodes. No significant vascular findings are present. Reproductive: Thin the left adnexa there is a fluid and hyperdense  probable blood containing lesion measuring 4.2 cm which could represent a large complex hemorrhagic cyst. The uterus is grossly unremarkable. Other: No evidence of abdominal wall mass or hernia. Musculoskeletal: No acute or significant osseous findings. IMPRESSION: No acute intrathoracic pathology. Nonobstructing 9 mm right renal calculus. Large 4.2 cm complex hyperdense lesion within the left adnexa which could represent a endometrioma/hemorrhagic cyst. If further evaluation is required would recommend pelvic ultrasound. Electronically Signed   By: Prudencio Pair M.D.   On: 01/22/2020 17:03    Micro Results     Recent Results (from the past 240 hour(s))  Culture, blood (Routine x 2)     Status: None   Collection Time: 01/22/20  2:09 PM   Specimen: BLOOD LEFT FOREARM  Result Value Ref Range Status   Specimen Description BLOOD LEFT FOREARM  Final   Special Requests   Final    BOTTLES DRAWN AEROBIC AND ANAEROBIC Blood Culture adequate volume   Culture   Final    NO GROWTH 5 DAYS Performed at [facility], [address] [place], [place] [address]    Report Status 01/27/2020 FINAL  Final  Culture, blood (Routine x 2)     Status: Abnormal   Collection Time: 01/22/20  2:24 PM   Specimen: BLOOD  Result Value Ref Range Status   Specimen Description BLOOD LEFT ANTECUBITAL  Final   Special Requests   Final    BOTTLES DRAWN AEROBIC ONLY Blood Culture results may not be optimal due to an excessive volume of blood received in culture bottles   Culture  Setup Time   Final    GRAM  POSITIVE COCCI IN CLUSTERS AEROBIC BOTTLE ONLY CRITICAL RESULT CALLED TO, READ BACK BY AND VERIFIED WITH: Andres Shad, PHARMD AT 1027 01/23/20 BY L BENFIELD    Culture (A)  Final    STAPHYLOCOCCUS SPECIES (COAGULASE NEGATIVE) THE SIGNIFICANCE OF ISOLATING THIS ORGANISM FROM A SINGLE SET OF BLOOD CULTURES WHEN MULTIPLE SETS ARE DRAWN IS UNCERTAIN. PLEASE NOTIFY THE MICROBIOLOGY DEPARTMENT WITHIN ONE WEEK IF SPECIATION AND SENSITIVITIES ARE REQUIRED. Performed at [facility], [place] [address] [place], [place] [address]    Report Status 01/24/2020 FINAL  Final  Blood Culture ID Panel (Reflexed)     Status: Abnormal   Collection Time: 01/22/20  2:24 PM  Result Value Ref Range Status   Enterococcus species NOT DETECTED NOT DETECTED Final   Listeria monocytogenes NOT DETECTED NOT DETECTED Final   Staphylococcus species DETECTED (A) NOT DETECTED Final    Comment: Methicillin (oxacillin) susceptible coagulase negative staphylococcus. Possible blood culture contaminant (unless isolated from more than one blood culture draw or clinical case suggests pathogenicity). No antibiotic treatment is indicated for blood  culture contaminants. CRITICAL RESULT CALLED TO, READ BACK BY AND VERIFIED WITH: Andres Shad, PHARMD AT 1027 01/23/20 BY L BENFIELD    Staphylococcus aureus (BCID) NOT DETECTED NOT DETECTED Final   Methicillin resistance NOT DETECTED NOT DETECTED Final   Streptococcus species NOT DETECTED NOT DETECTED Final   Streptococcus agalactiae NOT DETECTED NOT DETECTED Final   Streptococcus pneumoniae NOT DETECTED NOT DETECTED Final   Streptococcus pyogenes NOT DETECTED NOT DETECTED Final   Acinetobacter baumannii NOT DETECTED NOT DETECTED Final   Enterobacteriaceae species NOT DETECTED NOT DETECTED Final   Enterobacter cloacae complex NOT DETECTED NOT DETECTED Final   Escherichia coli NOT DETECTED NOT DETECTED Final   Klebsiella oxytoca NOT DETECTED NOT DETECTED Final   Klebsiella pneumoniae NOT  DETECTED NOT DETECTED Final  Proteus species NOT DETECTED NOT DETECTED Final   Serratia marcescens NOT DETECTED NOT DETECTED Final   Haemophilus influenzae NOT DETECTED NOT DETECTED Final   Neisseria meningitidis NOT DETECTED NOT DETECTED Final   Pseudomonas aeruginosa NOT DETECTED NOT DETECTED Final   Candida albicans NOT DETECTED NOT DETECTED Final   Candida glabrata NOT DETECTED NOT DETECTED Final   Candida krusei NOT DETECTED NOT DETECTED Final   Candida parapsilosis NOT DETECTED NOT DETECTED Final   Candida tropicalis NOT DETECTED NOT DETECTED Final    Comment: Performed at [facility], [place] [address], [place], [place] [address]  SARS Coronavirus 2 by RT PCR (hospital order, performed in [facility] hospital lab) Nasopharyngeal Nasopharyngeal Swab     Status: None   Collection Time: 01/22/20  4:35 PM   Specimen: Nasopharyngeal Swab  Result Value Ref Range Status   SARS Coronavirus 2 NEGATIVE NEGATIVE Final    Comment: (NOTE) SARS-CoV-2 target nucleic acids are NOT DETECTED.  The SARS-CoV-2 RNA is generally detectable in upper and lower respiratory specimens during the acute phase of infection. The lowest concentration of SARS-CoV-2 viral copies this assay can detect is 250 copies / mL. A negative result does not preclude SARS-CoV-2 infection and should not be used as the sole basis for treatment or other patient management decisions.  A negative result may occur with improper specimen collection / handling, submission of specimen other than nasopharyngeal swab, presence of viral mutation(s) within the areas targeted by this assay, and inadequate number of viral copies (<250 copies / mL). A negative result must be combined with clinical observations, patient history, and epidemiological information.  Fact Sheet for Patients:   StrictlyIdeas.no  Fact Sheet for Healthcare Providers: BankingDealers.co.za  This test is not  yet approved or  cleared by the [place] FDA and has been authorized for detection and/or diagnosis of SARS-CoV-2 by FDA under an Emergency Use Authorization (EUA).  This EUA will remain in effect (meaning this test can be used) for the duration of the COVID-19 declaration under Section 564(b)(1) of the Act, 21 U.S.C. section 360bbb-3(b)(1), unless the authorization is terminated or revoked sooner.  Performed at [facility], [place] [address], [place], [place] [address]     Today   Subjective    Pat Elicker today has no headache,no chest abdominal pain,no new weakness tingling or numbness, feels much better wants to go home today.    Objective   Blood pressure (!) 101/63, pulse 84, temperature 98 F (36.7 C), temperature source Oral, resp. rate 18, height 5' (1.524 m), weight 57.4 kg, SpO2 100 %.   Intake/Output Summary (Last 24 hours) at 01/27/2020 1408 Last data filed at 01/27/2020 0549 Gross per 24 hour  Intake 1022 ml  Output 850 ml  Net 172 ml    Exam  Awake Alert, No new F.N deficits  [place].AT,PERRAL Supple Neck,No JVD, No cervical lymphadenopathy appriciated.  Symmetrical Chest wall movement, Good air movement bilaterally, CTAB RRR,No Gallops,Rubs or new Murmurs, No Parasternal Heave +ve B.Sounds, Abd Soft, Non tender, No organomegaly appriciated, No rebound -guarding or rigidity. No Cyanosis, Clubbing or edema, No new Rash or bruise   Data Review   Recent Labs  Lab 01/22/20 1409 01/22/20 1409 01/23/20 0426 01/24/20 0242 01/25/20 0317 01/26/20 0445 01/27/20 1112  WBC 24.7*   < > 24.7* 21.1* 16.0* 10.5 7.3  HGB 14.9   < > 11.3* 11.0* 10.8* 10.0* 8.7*  HCT 49.0*   < > 36.8 36.3 33.8*  33.2* 26.7*  PLT 295   < > 244 221 264 211 217  MCV 99.4   < > 101.1* 102.5* 96.3 100.9* 96.7  MCH 30.2   < > 31.0 31.1 30.8 30.4 31.5  MCHC 30.4   < > 30.7 30.3 32.0 30.1 32.6  RDW 13.9   < > 14.2 14.2 13.3 13.1 13.3  LYMPHSABS 3.2  --   --   --   --  2.2 1.8    MONOABS 1.4*  --   --   --   --  0.5 0.5  EOSABS 0.0  --   --   --   --  0.1 0.1  BASOSABS 0.0  --   --   --   --  0.0 0.0   < > = values in this interval not displayed.    Recent Labs  Lab 01/22/20 1409 01/22/20 1631 01/22/20 1859 01/22/20 1900 01/22/20 2125 01/23/20 1712 01/24/20 0242 01/25/20 0317 01/25/20 0821 01/25/20 1804 01/26/20 0445 01/27/20 1112  NA 165*  --    < >  --    < > 161* 155* 147*  --   --  144 145  K 4.0  --    < >  --    < > 3.8 4.1 3.4*  --   --  4.2 3.5  CL 129*  --    < >  --    < > >130* >130* 119*  --   --  120* 119*  CO2 23  --    < >  --    < > 21* 19* 22  --   --  18* 21*  GLUCOSE 199*  --    < >  --    < > 172* 117* 126*  --   --  96 92  BUN 116*  --    < >  --    < > 63* 39* 16  --   --  9 6  CREATININE 3.06*  --    < >  --    < > 1.90* 1.43* 1.17*  --   --  1.04* 0.81  CALCIUM 12.7*  --    < >  --    < > 11.1* 10.3 10.7*  --   --  10.0 9.0  AST 18  --   --   --   --   --  34 48*  --   --  42* 39  ALT 19  --   --   --   --   --  23 31  --   --  27 31  ALKPHOS 109  --   --   --   --   --  77 94  --   --  76 78  BILITOT 1.4*  --   --   --   --   --  1.2 1.0  --   --  0.5 0.7  ALBUMIN 3.0*  --   --   --   --   --  2.3* 2.6*  --   --  2.3* 2.0*  MG  --   --   --   --   --   --   --  1.5*  --   --  2.0 1.6*  CRP  --   --   --   --   --   --   --   --   --   --  1.1* 1.6*  PROCALCITON  --   --   --   --   --   --   --   --  2.11  --  1.39 0.80  LATICACIDVEN 3.3* 2.4*  --  1.5  --   --   --   --   --   --   --   --   INR 1.5*  --   --   --   --   --   --   --   --   --   --   --   TSH  --   --   --   --   --   --   --   --   --  1.680  --   --   BNP  --   --   --   --   --   --   --  82.1  --   --  57.8 87.1   < > = values in this interval not displayed.    Recent Labs  Lab 01/22/20 1409 01/22/20 1631 01/22/20 1635 01/22/20 1900 01/25/20 0317 01/25/20 0821 01/26/20 0445 01/27/20 1112  CRP  --   --   --   --   --   --  1.1* 1.6*  BNP  --    --   --   --  82.1  --  57.8 87.1  PROCALCITON  --   --   --   --   --  2.11 1.39 0.80  LATICACIDVEN 3.3* 2.4*  --  1.5  --   --   --   --   SARSCOV2NAA  --   --  NEGATIVE  --   --   --   --   --         Total Time in preparing paper work, data evaluation and todays exam - 35 minutes  Lala Lund M.D on 01/27/2020 at 2:08 PM  [facility]   Office  [phone]

## 2020-01-30 ENCOUNTER — Telehealth: Payer: Self-pay

## 2020-01-30 ENCOUNTER — Other Ambulatory Visit (INDEPENDENT_AMBULATORY_CARE_PROVIDER_SITE_OTHER): Payer: Self-pay | Admitting: Primary Care

## 2020-01-30 NOTE — Telephone Encounter (Signed)
Transition Care Management Follow-up Telephone Call  Date of discharge and from where: 01/27/2020, [facility]   How have you been since you were released from the hospital? She said she is doing good.   Any questions or concerns?  she needed the phone number or Behavioral health clinic to schedule appointment,   Items Reviewed:  Did the pt receive and understand the discharge instructions provided? Yes she has the instructions , she was aware of the appts that she needs to schedule  Medications obtained and verified?  she said she has all medications except the tylenol. She also noted that she already  called Ms Oletta Lamas, NP requesting refills for her medications be sent to her [employer]. She did not have any  questions about her medication regime   Any new allergies since your discharge?  none reported   Do you have support at home?yes, her husband  No home health ordered.   Has home BP monitor.  Functional Questionnaire: (I = Independent and D = Dependent) ADLs: independent. Has RW to use as needed  Follow up appointments reviewed:   PCP Hospital f/u appt confirmed?  Juluis Mire, NP 02/07/2020  Specialist Hospital f/u appt confirmed? she needs to call endocrinology and surgery to schedule appts. She also needs to call Behavioral health - provided her with the clinic number.   Are transportation arrangements needed? no.   reminded her that she can call DSS to register for medicaid transportation to her medical appts  If their condition worsens, is the pt aware to call PCP or go to the Emergency Dept.?  yes  Was the patient provided with contact information for the PCP's office or ED? she has the clinic phone number  Was to pt encouraged to call back with questions or concerns?  yes, instructed her to call this CM or [facility]

## 2020-01-30 NOTE — Addendum Note (Signed)
Addended by: Jefferson Fuel on: 01/30/2020 10:13 AM   Modules accepted: Orders

## 2020-01-30 NOTE — Telephone Encounter (Signed)
PT need a refill acetaminophen (TYLENOL) 325 MG tablet [[id number]]  gabapentin (NEURONTIN) 300 MG capsule [[id number]]  ketoconazole (NIZORAL) 2 % shampoo [[id number]] risperiDONE (RISPERDAL) 2 MG tablet [[id number]] Prenatal Vit-Fe Fumarate-FA (WESTAB PLUS) 27-1 MG TABS [[id number]]  [facility] [id number] - [place], [place] - [address] [address]  Phone: [phone] Fax: [phone]

## 2020-01-30 NOTE — Telephone Encounter (Signed)
Requested medication (s) are due for refill today: no  Requested medication (s) are on the active medication list: yes    Future visit scheduled: yes  Notes to clinic:  review medication for refill Last filled by different providers   Requested Prescriptions  Pending Prescriptions Disp Refills   acetaminophen (TYLENOL) 325 MG tablet      Sig: Take 2 tablets (650 mg total) by mouth every 6 (six) hours as needed for mild pain.      Over the Counter:  OTC Passed - 01/30/2020 10:13 AM      Passed - Valid encounter within last 12 months    Recent Outpatient Visits           4 months ago Bipolar disorder, current episode mixed, moderate ([place])   [place] Scot Jun, FNP   2 years ago Bipolar affective disorder, remission status unspecified ([place])   [place], Roger David, PA-C   2 years ago Possible pregnancy   [place], Roger David, PA-C   2 years ago Bipolar depression [facility])   [place], Roger David, PA-C   2 years ago Muscle cramps   [place] Tresa Garter, MD       Future Appointments             In 1 week Kerin Perna, NP [facility]              ketoconazole (NIZORAL) 2 % shampoo 120 mL     Sig: Apply 1 application topically daily as needed.      Over the Counter:  OTC Passed - 01/30/2020 10:13 AM      Passed - Valid encounter within last 12 months    Recent Outpatient Visits           4 months ago Bipolar disorder, current episode mixed, moderate ([place])   [place] Scot Jun, FNP   2 years ago Bipolar affective disorder, remission status unspecified ([place])   [place], Roger David, PA-C   2 years ago Possible pregnancy   [place], Roger David, PA-C   2 years ago Bipolar  depression [facility])   [place], Roger David, PA-C   2 years ago Muscle cramps   [place] Tresa Garter, MD       Future Appointments             In 1 week Kerin Perna, NP [facility]              gabapentin (NEURONTIN) 300 MG capsule 90 capsule 1    Sig: Take 1 capsule (300 mg total) by mouth 3 (three) times daily.      Neurology: Anticonvulsants - gabapentin Passed - 01/30/2020 10:13 AM      Passed - Valid encounter within last 12 months    Recent Outpatient Visits           4 months ago Bipolar disorder, current episode mixed, moderate ([place])   [place] Scot Jun, FNP   2 years ago Bipolar affective disorder, remission status unspecified ([place])   [place], Roger David, PA-C   2 years ago Possible pregnancy   [place]  [facility] Clent Demark, PA-C   2 years ago Bipolar depression [facility])   [place] Clent Demark, PA-C   2 years ago Muscle cramps   [place] Tresa Garter, MD       Future Appointments             In 1 week Oletta Lamas, [place] Cage, NP [facility]              risperiDONE (RISPERDAL) 2 MG tablet 90 tablet 2    Sig: 1 in am 2 at hs      Not Delegated - Psychiatry:  Antipsychotics - Second Generation (Atypical) - risperidone Failed - 01/30/2020 10:13 AM      Failed - This refill cannot be delegated      Failed - Prolactin Level (serum) in normal range and within 180 days    No results found for: PROLACTIN, TOTPROLACTIN, LABPROL        Passed - ALT in normal range and within 180 days    ALT  Date Value Ref Range Status  01/27/2020 31 0 - 44 U/L Final          Passed - AST in normal range and within 180 days    AST  Date Value Ref Range Status  01/27/2020 39 15 - 41 U/L Final           Passed - Valid encounter within last 6 months    Recent Outpatient Visits           4 months ago Bipolar disorder, current episode mixed, moderate ([place])   [place] Scot Jun, FNP   2 years ago Bipolar affective disorder, remission status unspecified ([place])   [place], Roger David, PA-C   2 years ago Possible pregnancy   [place], Roger David, PA-C   2 years ago Bipolar depression [facility])   [place], Roger David, PA-C   2 years ago Muscle cramps   [place] Tresa Garter, MD       Future Appointments             In 1 week Kerin Perna, NP [place]

## 2020-02-07 ENCOUNTER — Other Ambulatory Visit: Payer: Self-pay

## 2020-02-07 ENCOUNTER — Telehealth (INDEPENDENT_AMBULATORY_CARE_PROVIDER_SITE_OTHER): Payer: Self-pay | Admitting: Primary Care

## 2020-02-07 ENCOUNTER — Ambulatory Visit (INDEPENDENT_AMBULATORY_CARE_PROVIDER_SITE_OTHER): Payer: Medicaid Other | Admitting: Primary Care

## 2020-02-07 ENCOUNTER — Encounter (INDEPENDENT_AMBULATORY_CARE_PROVIDER_SITE_OTHER): Payer: Self-pay | Admitting: Primary Care

## 2020-02-07 VITALS — BP 114/82 | HR 93 | Temp 98.4°F | Ht 60.0 in | Wt 128.2 lb

## 2020-02-07 DIAGNOSIS — F3162 Bipolar disorder, current episode mixed, moderate: Secondary | ICD-10-CM

## 2020-02-07 DIAGNOSIS — Z09 Encounter for follow-up examination after completed treatment for conditions other than malignant neoplasm: Secondary | ICD-10-CM | POA: Diagnosis not present

## 2020-02-07 DIAGNOSIS — R32 Unspecified urinary incontinence: Secondary | ICD-10-CM | POA: Diagnosis not present

## 2020-02-07 DIAGNOSIS — R6510 Systemic inflammatory response syndrome (SIRS) of non-infectious origin without acute organ dysfunction: Secondary | ICD-10-CM

## 2020-02-07 NOTE — Telephone Encounter (Signed)
Copied from [place] [phone]. Topic: Quick Communication - Rx Refill/Question >> Feb 07, 2020  5:39 PM Erick Blinks wrote: Medication: Pt called and is requesting depends adult diapers  Has the patient contacted their pharmacy? Yes.   (Agent: If no, request that the patient contact the pharmacy for the refill.) (Agent: If yes, when and what did the pharmacy advise?)  Preferred Pharmacy (with phone number or street name):  [place], [place] - [address] [place] [address] Phone: [phone] Fax: [phone]   Agent: Please be advised that RX refills may take up to 3 business days. We ask that you follow-up with your pharmacy.

## 2020-02-07 NOTE — Progress Notes (Signed)
Assessment and Plan: Theresa Mccoy was seen today for hospitalization follow-up.  Diagnoses and all orders for this visit:  Bipolar disorder, current episode mixed, moderate ([place]) Psych referrals for management   Hospital discharge follow-up Sepsis secondary to UTI UA positive for nitrate and large leukocyte and many bacteria.  Urine culture pending. Received Zosyn in the ED and will switch to IV Rocephin in the morning  -     CBC with Differential -     CMP14+EGFR -     DG Chest 2 View; Future unclear need for x-ray   Urinary incontinence, unspecified type -     For home use only DME Other see comment       HPI Theresa Mccoy is a 39 y.o.female presents for follow up from the hospital. Admit date to the hospital was 01/22/20, patient was discharged from the hospital on 01/27/20, patient was admitted for: SIRs  (systemic inflammatory response syndrome)- at the time of admission no sepsis, this was due to extreme dehydration from hypercalcemia which has improved.   Past Medical History:  Diagnosis Date  . Anemia   . Depression   . Hyperthyroidism   . PTSD (post-traumatic stress disorder)   . SBO (small bowel obstruction) ([place]) 07/07/2019  . Thyroid disease      Allergies  Allergen Reactions  . Codeine Other (See Comments)    Constipation       Current Outpatient Medications on File Prior to Visit  Medication Sig Dispense Refill  . risperiDONE (RISPERDAL) 2 MG tablet 1 in am 2 at hs (Patient taking differently: Take 2 mg by mouth daily. ) 90 tablet 2  . acetaminophen (TYLENOL) 325 MG tablet Take 2 tablets (650 mg total) by mouth every 6 (six) hours as needed for mild pain. (Patient not taking: Reported on 02/07/2020)    . Blood Pressure Monitoring (BLOOD PRESSURE MONITOR AUTOMAT) DEVI 1 Device by Does not apply route daily. Automatic blood pressure cuff with regular cuff. Blood pressure to be monitored regularly at home. ICD-10 code: O09.90. (Patient not taking: Reported on  02/07/2020) 1 kit 0  . gabapentin (NEURONTIN) 300 MG capsule Take 1 capsule (300 mg total) by mouth 3 (three) times daily. (Patient not taking: Reported on 02/07/2020) 90 capsule 1  . ketoconazole (NIZORAL) 2 % shampoo Apply 1 application topically daily as needed for itching. (Patient not taking: Reported on 02/07/2020)     No current facility-administered medications on file prior to visit.    ROS: all negative except above.   Physical Exam: Filed Weights   02/07/20 1511  Weight: 128 lb 3.2 oz (58.2 kg)   BP 114/82 (BP Location: Right Arm, Patient Position: Sitting, Cuff Size: Normal)   Pulse 93   Temp 98.4 F (36.9 C) (Oral)   Ht 5' (1.524 m)   Wt 128 lb 3.2 oz (58.2 kg)   LMP 01/19/2020 (Approximate)   SpO2 99%   BMI 25.04 kg/m  General Appearance: Well nourished, in no apparent distress. Eyes: PERRLA, EOMs, conjunctiva no swelling or erythema Sinuses: No Frontal/maxillary tenderness ENT/Mouth: Ext aud canals clear, TMs without erythema, bulging. No erythema, swelling, or exudate on post pharynx.  Tonsils not swollen or erythematous. Hearing normal.  Neck: Supple, thyroid normal.  Respiratory: Respiratory effort normal, BS equal bilaterally without rales, rhonchi, wheezing or stridor.  Cardio: RRR with no MRGs. Brisk peripheral pulses without edema.  Abdomen: Soft, + BS.  Non tender, no guarding, rebound, hernias, masses. Lymphatics: Non tender without lymphadenopathy.  Musculoskeletal: Full  ROM, 5/5 strength, normal gait.  Skin: Warm, dry without rashes, lesions, ecchymosis.  Neuro: Cranial nerves intact. Normal muscle tone, no cerebellar symptoms. Sensation intact.  Psych: Awake and oriented X 3, normal affect, Insight and Judgment appropriate.     Kerin Perna, NP 3:29 PM

## 2020-02-07 NOTE — Patient Instructions (Signed)
Healthy Eating Following a healthy eating pattern may help you to achieve and maintain a healthy body weight, reduce the risk of chronic disease, and live a long and productive life. It is important to follow a healthy eating pattern at an appropriate calorie level for your body. Your nutritional needs should be met primarily through food by choosing a variety of nutrient-rich foods. What are tips for following this plan? Reading food labels  Read labels and choose the following: ? Reduced or low sodium. ? Juices with 100% fruit juice. ? Foods with low saturated fats and high polyunsaturated and monounsaturated fats. ? Foods with whole grains, such as whole wheat, cracked wheat, brown rice, and wild rice. ? Whole grains that are fortified with folic acid. This is recommended for women who are pregnant or who want to become pregnant.  Read labels and avoid the following: ? Foods with a lot of added sugars. These include foods that contain brown sugar, corn sweetener, corn syrup, dextrose, fructose, glucose, high-fructose corn syrup, honey, invert sugar, lactose, malt syrup, maltose, molasses, raw sugar, sucrose, trehalose, or turbinado sugar.  Do not eat more than the following amounts of added sugar per day:  6 teaspoons (25 g) for women.  9 teaspoons (38 g) for men. ? Foods that contain processed or refined starches and grains. ? Refined grain products, such as white flour, degermed cornmeal, white bread, and white rice. Shopping  Choose nutrient-rich snacks, such as vegetables, whole fruits, and nuts. Avoid high-calorie and high-sugar snacks, such as potato chips, fruit snacks, and candy.  Use oil-based dressings and spreads on foods instead of solid fats such as butter, stick margarine, or cream cheese.  Limit pre-made sauces, mixes, and "instant" products such as flavored rice, instant noodles, and ready-made pasta.  Try more plant-protein sources, such as tofu, tempeh, black beans,  edamame, lentils, nuts, and seeds.  Explore eating plans such as the Mediterranean diet or vegetarian diet. Cooking  Use oil to saut or stir-fry foods instead of solid fats such as butter, stick margarine, or lard.  Try baking, boiling, grilling, or broiling instead of frying.  Remove the fatty part of meats before cooking.  Steam vegetables in water or broth. Meal planning   At meals, imagine dividing your plate into fourths: ? One-half of your plate is fruits and vegetables. ? One-fourth of your plate is whole grains. ? One-fourth of your plate is protein, especially lean meats, poultry, eggs, tofu, beans, or nuts.  Include low-fat dairy as part of your daily diet. Lifestyle  Choose healthy options in all settings, including home, work, school, restaurants, or stores.  Prepare your food safely: ? Wash your hands after handling raw meats. ? Keep food preparation surfaces clean by regularly washing with hot, soapy water. ? Keep raw meats separate from ready-to-eat foods, such as fruits and vegetables. ? Cook seafood, meat, poultry, and eggs to the recommended internal temperature. ? Store foods at safe temperatures. In general:  Keep cold foods at 59F (4.4C) or below.  Keep hot foods at 159F (60C) or above.  Keep your freezer at [facility] (-17.8C) or below.  Foods are no longer safe to eat when they have been between the temperatures of 40-159F (4.4-60C) for more than 2 hours. What foods should I eat? Fruits Aim to eat 2 cup-equivalents of fresh, canned (in natural juice), or frozen fruits each day. Examples of 1 cup-equivalent of fruit include 1 small apple, 8 large strawberries, 1 cup canned fruit,  cup  dried fruit, or 1 cup 100% juice. Vegetables Aim to eat 2-3 cup-equivalents of fresh and frozen vegetables each day, including different varieties and colors. Examples of 1 cup-equivalent of vegetables include 2 medium carrots, 2 cups raw, leafy greens, 1 cup chopped  vegetable (raw or cooked), or 1 medium baked potato. Grains Aim to eat 6 ounce-equivalents of whole grains each day. Examples of 1 ounce-equivalent of grains include 1 slice of bread, 1 cup ready-to-eat cereal, 3 cups popcorn, or  cup cooked rice, pasta, or cereal. Meats and other proteins Aim to eat 5-6 ounce-equivalents of protein each day. Examples of 1 ounce-equivalent of protein include 1 egg, 1/2 cup nuts or seeds, or 1 tablespoon (16 g) peanut butter. A cut of meat or fish that is the size of a deck of cards is about 3-4 ounce-equivalents.  Of the protein you eat each week, try to have at least 8 ounces come from seafood. This includes salmon, trout, herring, and anchovies. Dairy Aim to eat 3 cup-equivalents of fat-free or low-fat dairy each day. Examples of 1 cup-equivalent of dairy include 1 cup (240 mL) milk, 8 ounces (250 g) yogurt, 1 ounces (44 g) natural cheese, or 1 cup (240 mL) fortified soy milk. Fats and oils  Aim for about 5 teaspoons (21 g) per day. Choose monounsaturated fats, such as canola and olive oils, avocados, peanut butter, and most nuts, or polyunsaturated fats, such as sunflower, corn, and soybean oils, walnuts, pine nuts, sesame seeds, sunflower seeds, and flaxseed. Beverages  Aim for six 8-oz glasses of water per day. Limit coffee to three to five 8-oz cups per day.  Limit caffeinated beverages that have added calories, such as soda and energy drinks.  Limit alcohol intake to no more than 1 drink a day for nonpregnant women and 2 drinks a day for men. One drink equals 12 oz of beer (355 mL), 5 oz of wine (148 mL), or 1 oz of hard liquor (44 mL). Seasoning and other foods  Avoid adding excess amounts of salt to your foods. Try flavoring foods with herbs and spices instead of salt.  Avoid adding sugar to foods.  Try using oil-based dressings, sauces, and spreads instead of solid fats. This information is based on general [place] nutrition guidelines. For more  information, visit [employer]. Exact amounts may vary based on your nutrition needs. Summary  A healthy eating plan may help you to maintain a healthy weight, reduce the risk of chronic diseases, and stay active throughout your life.  Plan your meals. Make sure you eat the right portions of a variety of nutrient-rich foods.  Try baking, boiling, grilling, or broiling instead of frying.  Choose healthy options in all settings, including home, work, school, restaurants, or stores. This information is not intended to replace advice given to you by your health care provider. Make sure you discuss any questions you have with your health care provider. Document Revised: 10/05/2017 Document Reviewed: 10/05/2017 Elsevier Patient Education  [place].

## 2020-02-08 LAB — CBC WITH DIFFERENTIAL/PLATELET
Basophils Absolute: 0 10*3/uL (ref 0.0–0.2)
Basos: 1 %
EOS (ABSOLUTE): 0.1 10*3/uL (ref 0.0–0.4)
Eos: 2 %
Hematocrit: 27.7 % — ABNORMAL LOW (ref 34.0–46.6)
Hemoglobin: 9.4 g/dL — ABNORMAL LOW (ref 11.1–15.9)
Immature Grans (Abs): 0 10*3/uL (ref 0.0–0.1)
Immature Granulocytes: 0 %
Lymphocytes Absolute: 2.4 10*3/uL (ref 0.7–3.1)
Lymphs: 37 %
MCH: 31.4 pg (ref 26.6–33.0)
MCHC: 33.9 g/dL (ref 31.5–35.7)
MCV: 93 fL (ref 79–97)
Monocytes Absolute: 0.7 10*3/uL (ref 0.1–0.9)
Monocytes: 11 %
Neutrophils Absolute: 3.1 10*3/uL (ref 1.4–7.0)
Neutrophils: 49 %
Platelets: 275 10*3/uL (ref 150–450)
RBC: 2.99 x10E6/uL — ABNORMAL LOW (ref 3.77–5.28)
RDW: 13.6 % (ref 11.7–15.4)
WBC: 6.3 10*3/uL (ref 3.4–10.8)

## 2020-02-08 LAB — CMP14+EGFR
ALT: 12 IU/L (ref 0–32)
AST: 9 IU/L (ref 0–40)
Albumin/Globulin Ratio: 1 — ABNORMAL LOW (ref 1.2–2.2)
Albumin: 2.9 g/dL — ABNORMAL LOW (ref 3.8–4.8)
Alkaline Phosphatase: 158 IU/L — ABNORMAL HIGH (ref 48–121)
BUN/Creatinine Ratio: 14 (ref 9–23)
BUN: 10 mg/dL (ref 6–20)
Bilirubin Total: 0.2 mg/dL (ref 0.0–1.2)
CO2: 24 mmol/L (ref 20–29)
Calcium: 9.3 mg/dL (ref 8.7–10.2)
Chloride: 109 mmol/L — ABNORMAL HIGH (ref 96–106)
Creatinine, Ser: 0.69 mg/dL (ref 0.57–1.00)
GFR calc Af Amer: 127 mL/min/{1.73_m2} (ref >59)
GFR calc non Af Amer: 110 mL/min/{1.73_m2} (ref >59)
Globulin, Total: 2.8 g/dL (ref 1.5–4.5)
Glucose: 88 mg/dL (ref 65–99)
Potassium: 4.2 mmol/L (ref 3.5–5.2)
Sodium: 140 mmol/L (ref 134–144)
Total Protein: 5.7 g/dL — ABNORMAL LOW (ref 6.0–8.5)

## 2020-02-09 NOTE — Telephone Encounter (Signed)
Pharmacy will not accept the prescription given to patient. Needs to be written on normal Rx paper or pad. Or sent electronically to pharmacy.

## 2020-03-29 IMAGING — CT CT PELVIS W/O CM
1 series · 15 of 32 positions shown, 19 images · non-contrast
Comparison: CT of the abdomen pelvis 07/13/2019

CLINICAL DATA: 39-year-old with history closed loop bowel
obstruction and necrotic bowel. Patient has postoperative fluid and
plan for CT-guided aspiration or drainage.

EXAM:
CT PELVIS WITHOUT CONTRAST
TECHNIQUE: Multidetector CT imaging of the pelvis was performed following the
standard protocol without intravenous contrast.

[Series 2: i-spiral 5.0 b40f · axial · 0.85mm/px · z∈[+822,+1074]mm · 15 of 81 slices shown, 19 images]
[im 6/81  soft-tissue]
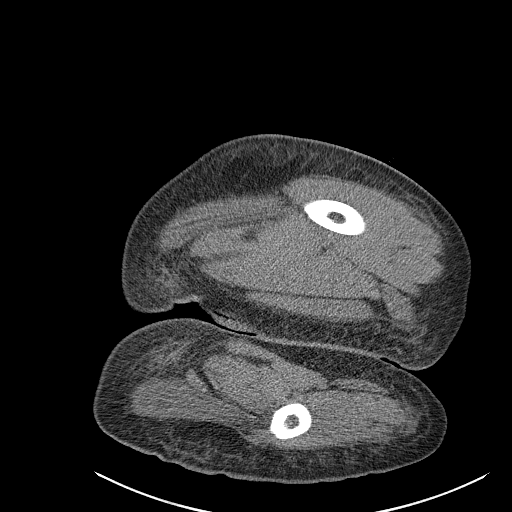
[im 6/81  bone]
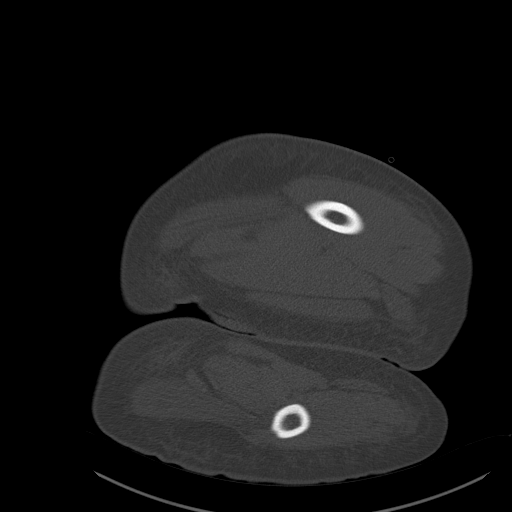
[im 11/81  soft-tissue]
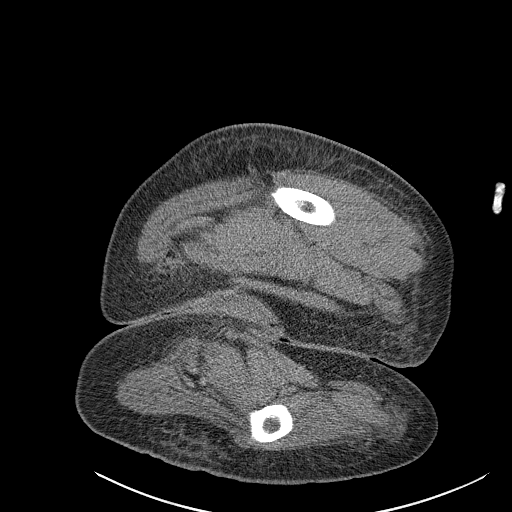
[im 16/81  soft-tissue]
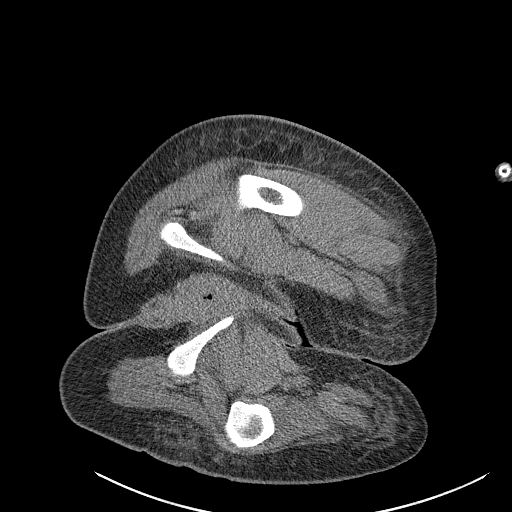
[im 24/81  soft-tissue]
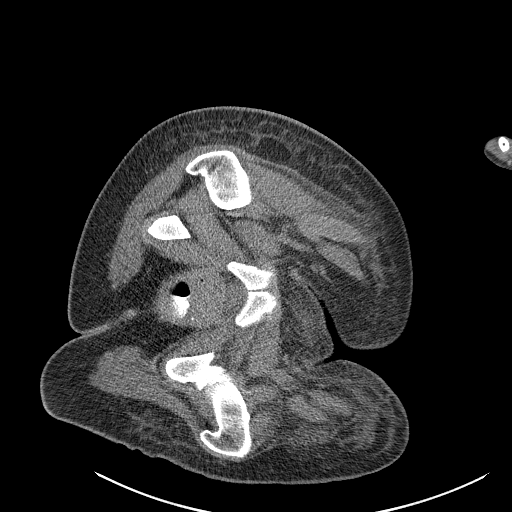
[im 29/81  soft-tissue]
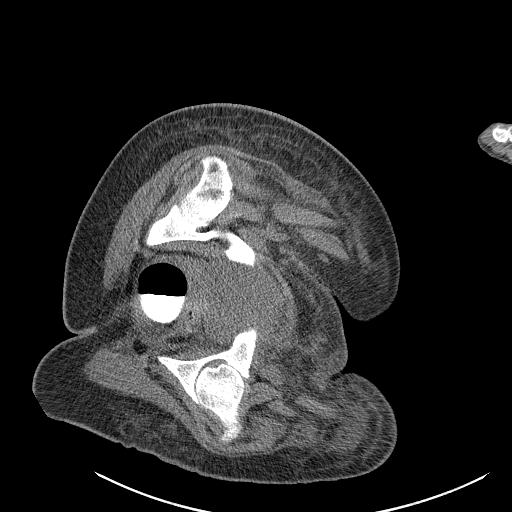
[im 34/81  soft-tissue]
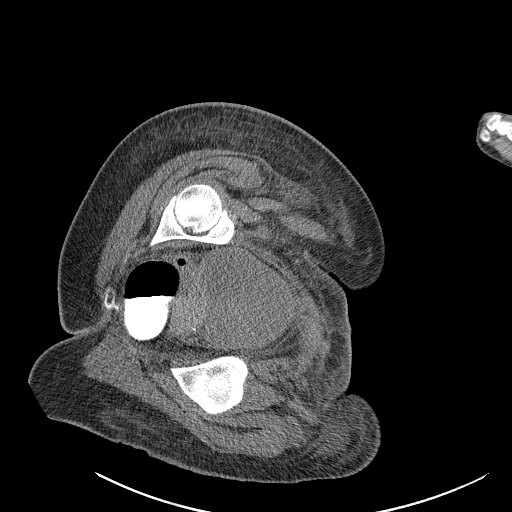
[im 42/81  soft-tissue]
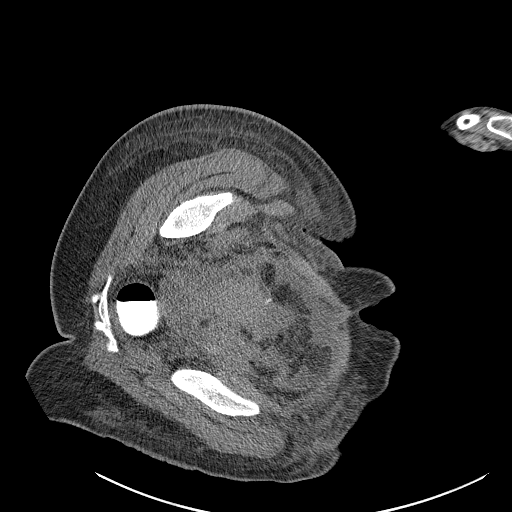
[im 47/81  soft-tissue]
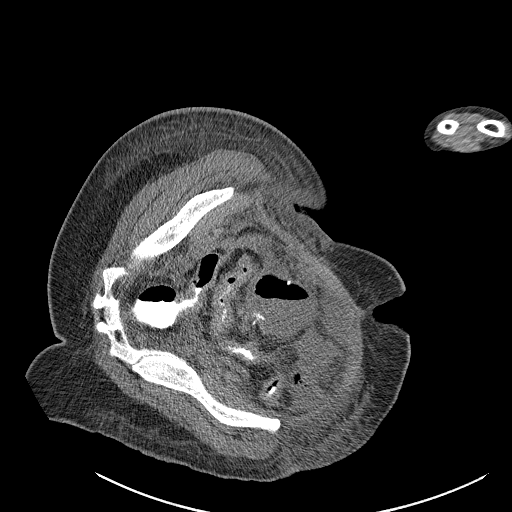
[im 52/81  soft-tissue]
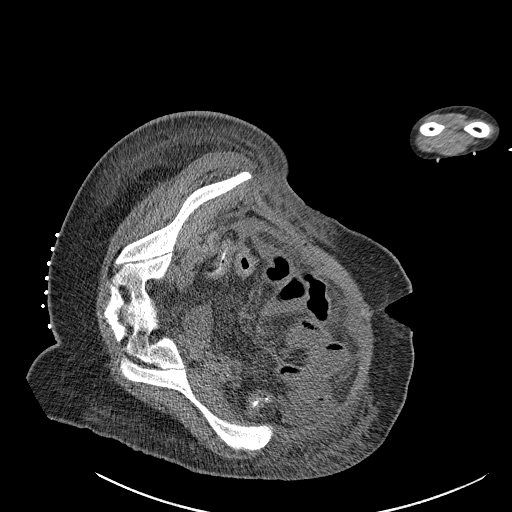
[im 52/81  bone]
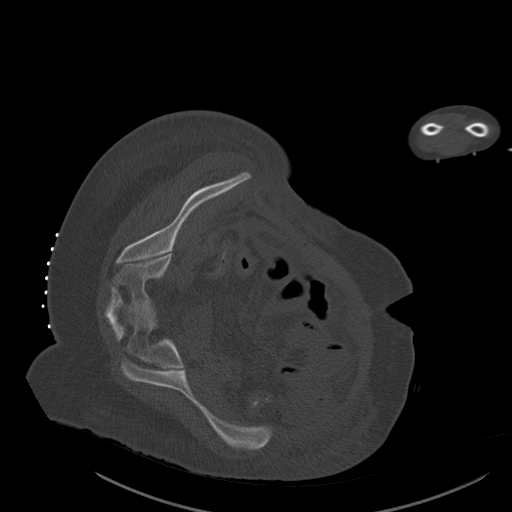
[im 57/81  soft-tissue]
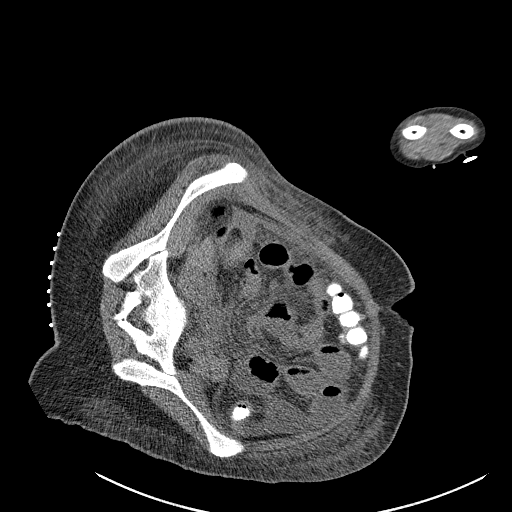
[im 65/81  soft-tissue]
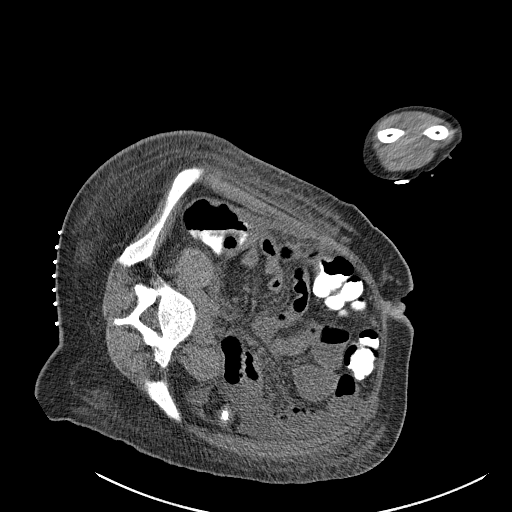
[im 70/81  soft-tissue]
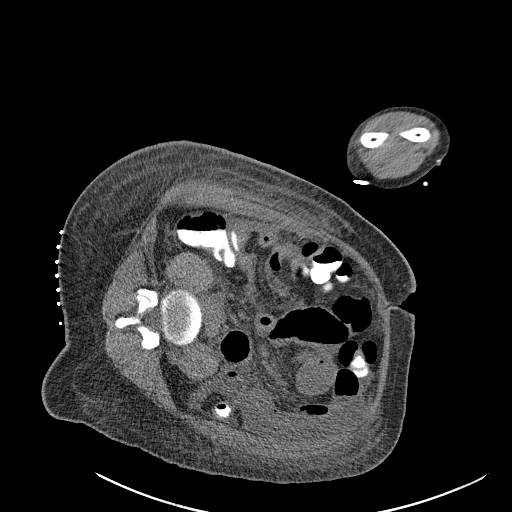
[im 70/81  lung]
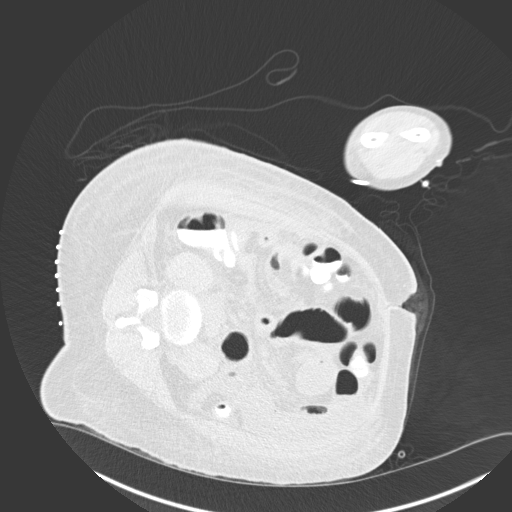
[im 73/81  lung]
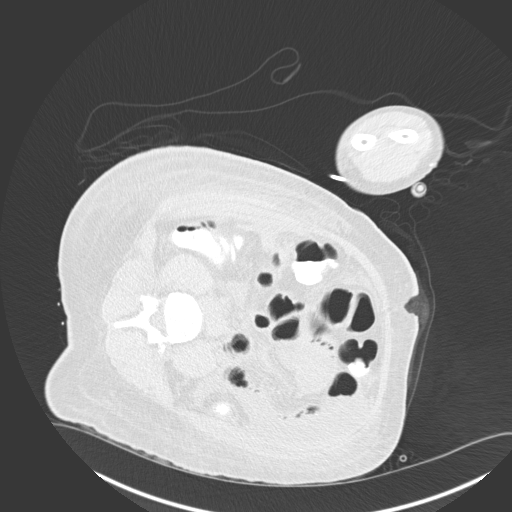
[im 75/81  soft-tissue]
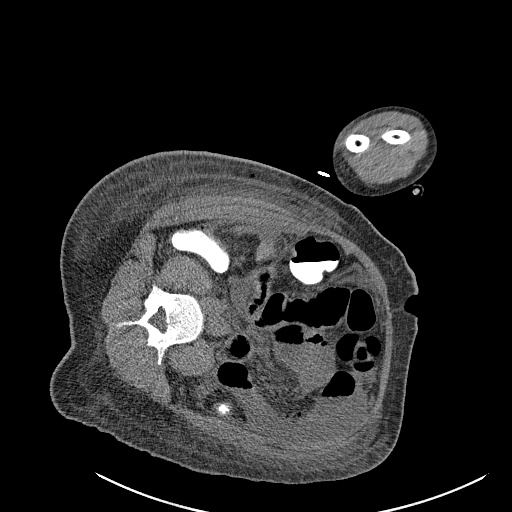
[im 75/81  lung]
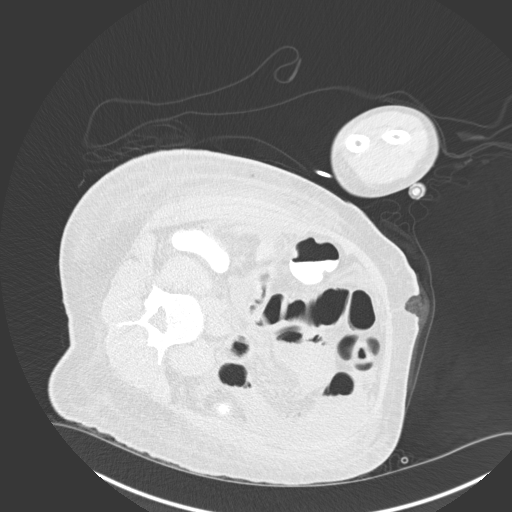
[im 78/81  lung]
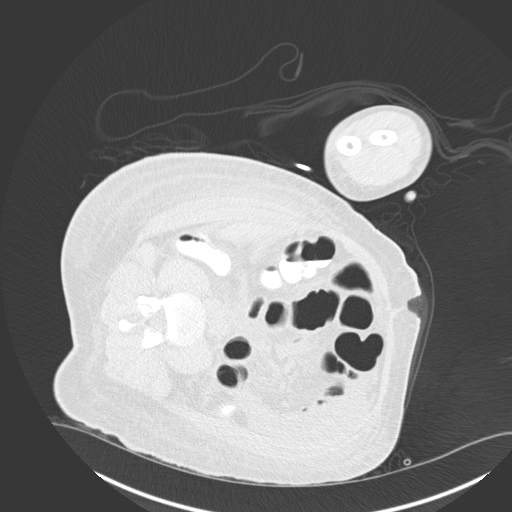

[15 of 32 positions shown; findings below may reference images not displayed]

FINDINGS: CT images were obtained with the patient on her left side in
anticipation of a transgluteal drain placement.

Oral contrast has migrated into the colon and rectum. Mild to
moderate distention of the rectum with gas and oral contrast. The
pelvic fluid collection is smaller and there is a small amount of
fluid along the anterior lower abdomen and layering in the left
lower abdomen.
IMPRESSION: Pelvic fluid collection has decreased in size and suspect this fluid
is layering ascites. Again noted is a small amount of fluid in the
anterior lower abdomen and left lower quadrant. Findings discussed
with Dr. Wittgren in General Surgery. CT-guided aspiration/drainage was
not performed.

## 2020-04-01 IMAGING — CT CT ABD-PELV W/ CM
2 of 4 series · 14 of 46 positions shown, 16 images · IV contrast (Omni 300)
Comparison: 07/13/2019

CLINICAL DATA: Intra-abdominal abscess.

EXAM:
CT ABDOMEN AND PELVIS WITH CONTRAST
TECHNIQUE: Multidetector CT imaging of the abdomen and pelvis was performed
using the standard protocol following bolus administration of
intravenous contrast.
CONTRAST:  100 cc Omnipaque 300

[Series 3: a/p w/ 5mm · axial · 0.76mm/px · z∈[-362,+18]mm · 11 of 92 slices shown, 13 images]
[im 8/92  soft-tissue]
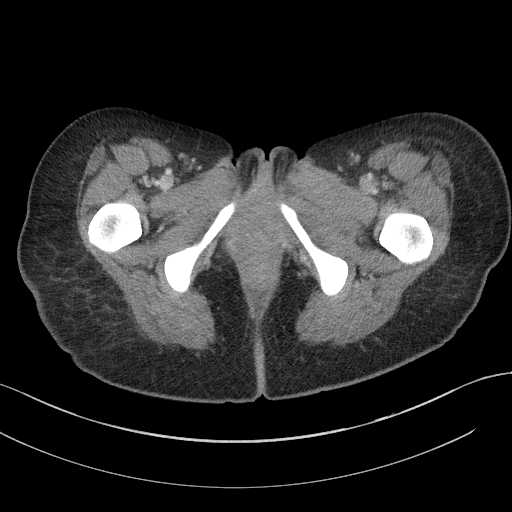
[im 8/92  bone]
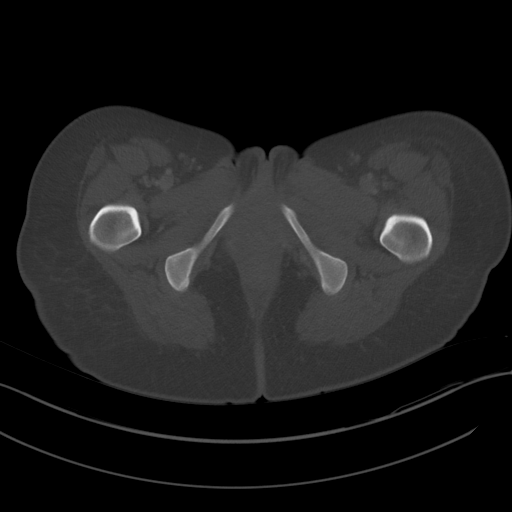
[im 16/92  soft-tissue]
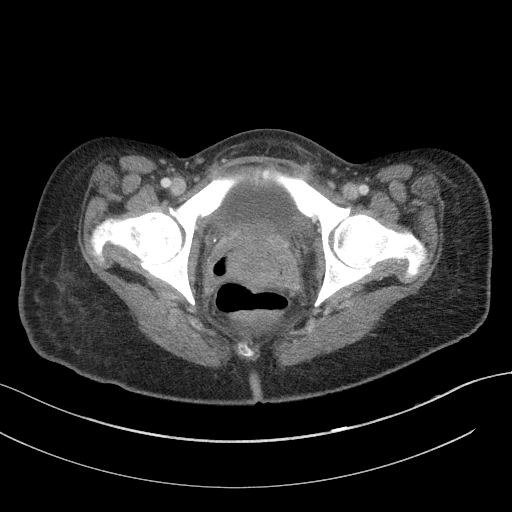
[im 23/92  soft-tissue]
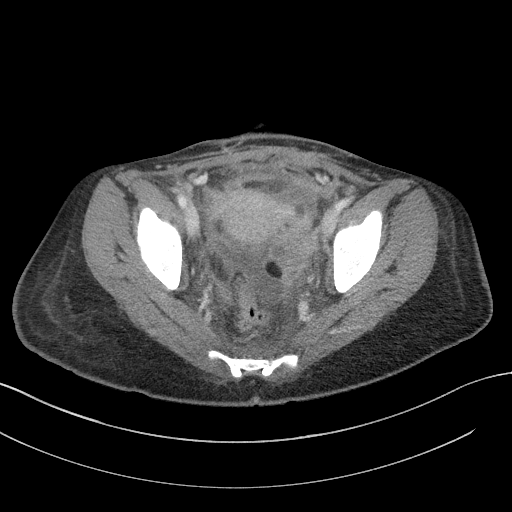
[im 31/92  soft-tissue]
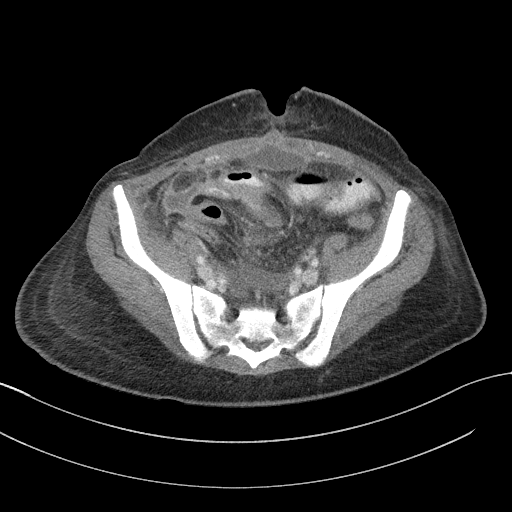
[im 38/92  soft-tissue]
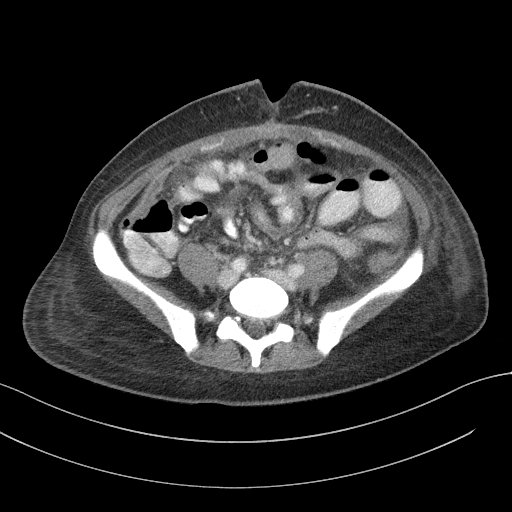
[im 46/92  soft-tissue]
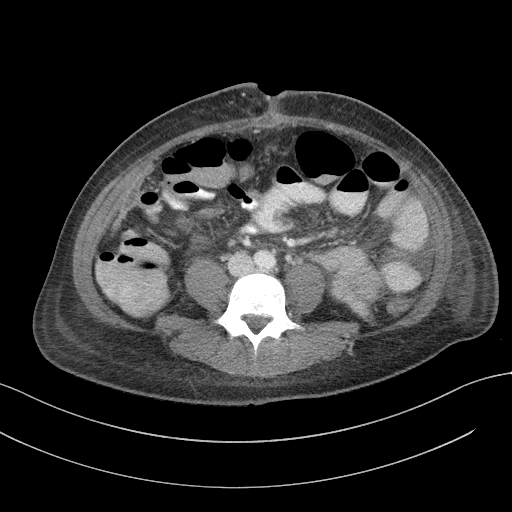
[im 54/92  soft-tissue]
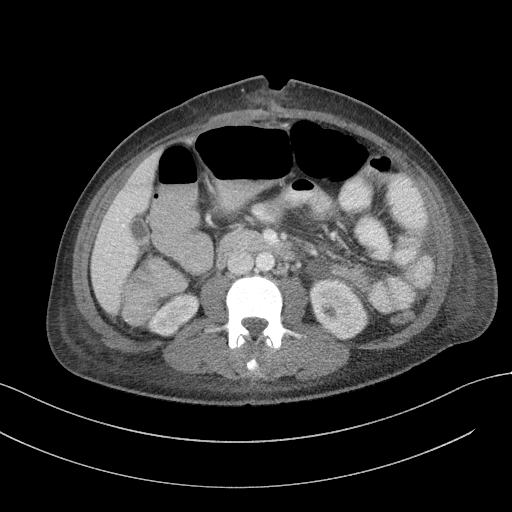
[im 61/92  soft-tissue]
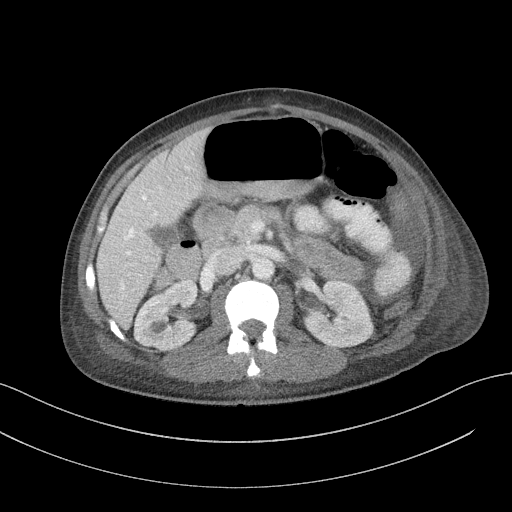
[im 69/92  soft-tissue]
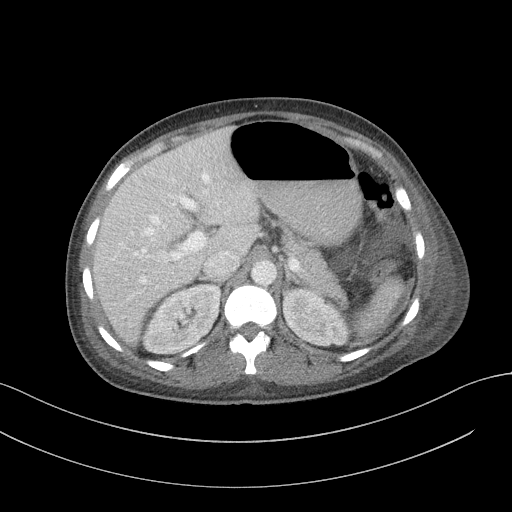
[im 69/92  bone]
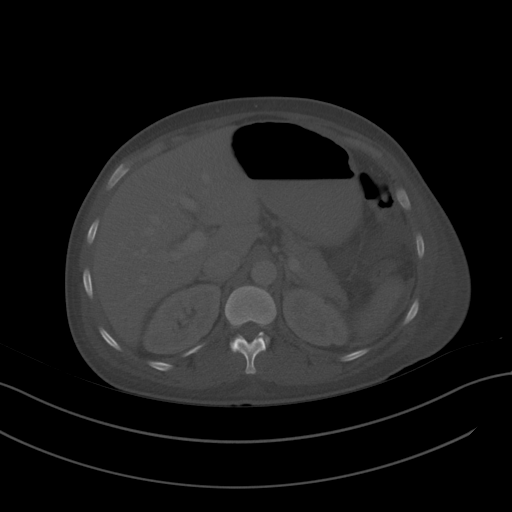
[im 76/92  soft-tissue]
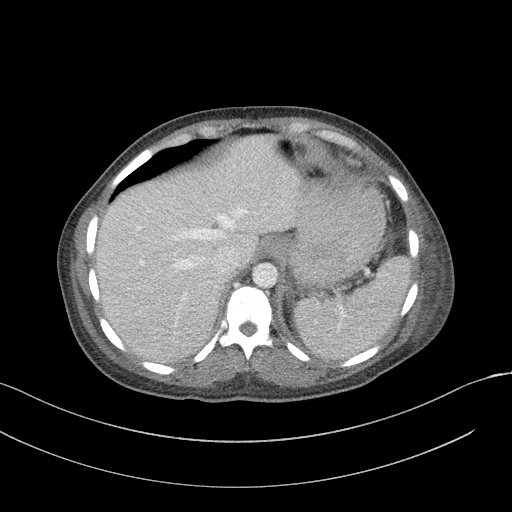
[im 84/92  soft-tissue]
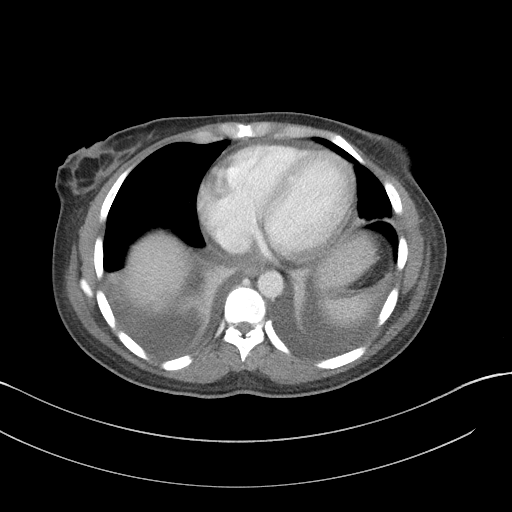

[Series 6: a/p w/ cor · coronal · 0.75mm/px · 3 of 123 slices shown]
[im 41/123  soft-tissue]
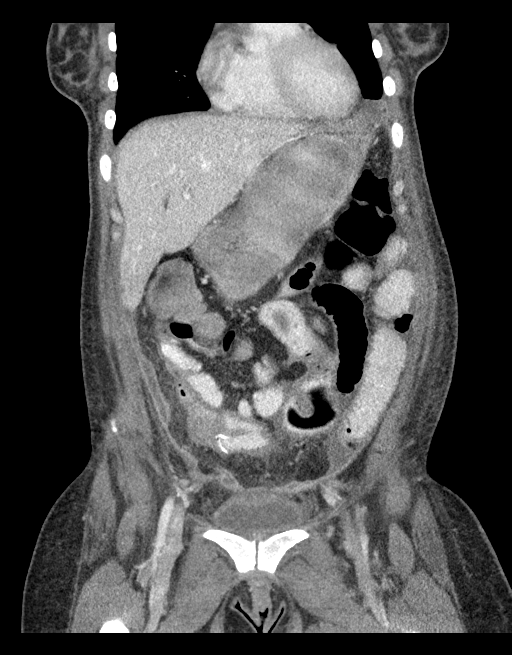
[im 55/123  soft-tissue]
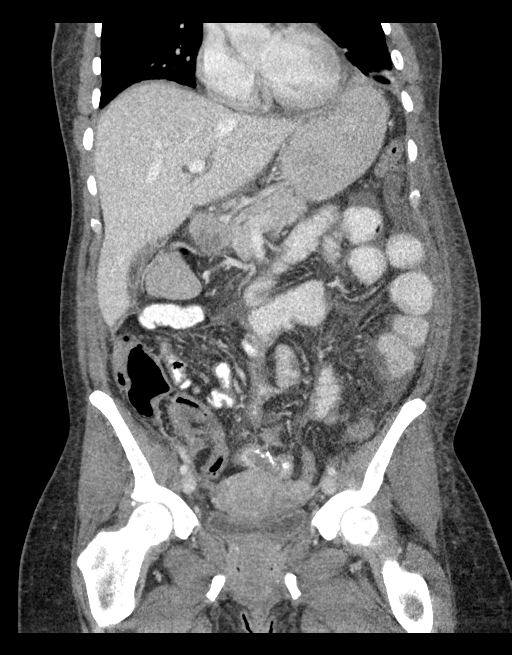
[im 68/123  soft-tissue]
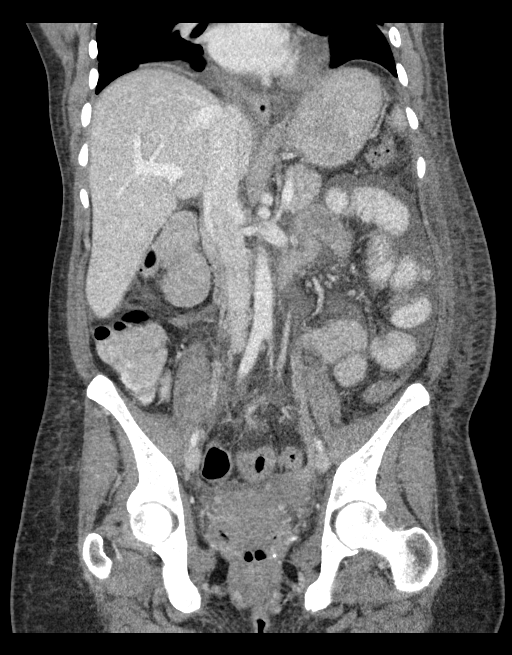

[14 of 46 positions shown; findings below may reference images not displayed]

FINDINGS: Lower chest: Similar small to moderate bilateral pleural effusions
with bilateral lower lobe collapse/consolidation.

Hepatobiliary: Tiny 5 mm hypodensity in the posterior right liver is
too small to characterize but likely benign. Probable tiny layering
gallstones. No intrahepatic or extrahepatic biliary dilation.

Pancreas: No focal mass lesion. No dilatation of the main duct. No
intraparenchymal cyst. No peripancreatic edema.

Spleen: No splenomegaly. No focal mass lesion.

Adrenals/Urinary Tract: Right adrenal gland unremarkable. Stable 14
mm left adrenal nodule, incompletely characterized today but on a
noncontrast CT of 07/02/2019 this had low attenuation compatible
with adenoma. 8 mm nonobstructing stone noted lower pole right
kidney. Tiny nonobstructing stone identified interpolar right kidney
with stable appearance of a 12 mm interpolar right renal cyst. Left
kidney unremarkable. No evidence for hydroureter. The urinary
bladder appears normal for the degree of distention.

Stomach/Bowel: Stomach is distended with fluid/dilute contrast.
Duodenum is normally positioned as is the ligament of Treitz. Mild
distention of fluid-filled left abdominal small bowel loops noted
measuring up to 3.2 cm maximum diameter, stable to potentially
minimally decreased since prior. Contrast material is seen and
distal small bowel loops and terminal ileum which are nondilated.
Fluid and gas are noted in the right and transverse colon. Left
colon is largely decompressed but does contain gas.

Vascular/Lymphatic: No abdominal aortic aneurysm. No abdominal
aortic atherosclerotic calcification. There is no gastrohepatic or
hepatoduodenal ligament lymphadenopathy. No retroperitoneal or
mesenteric lymphadenopathy. No pelvic sidewall lymphadenopathy.

Reproductive: The uterus is unremarkable.  There is no adnexal mass.

Other: Free fluid is seen around the inferior liver and spleen
interloop mesenteric fluid noted. Multiple rim enhancing fluid
collections are identified in the peritoneal cavity of the lower
abdomen and pelvis.

Midline lens shaped rim enhancing fluid collection is identified
just deep to the rectus sheath, measuring 4.7 x 2.0 x 7.7 cm
tracking from the dome of the bladder cranially towards the
umbilicus.

Irregular right lower quadrant anterior rim enhancing fluid
collection is also mainly lens shaped but has some fingers of rim
enhancing fluid extending posteriorly towards its inferior margin.
This collection is difficult to measure given the irregular shape,
but is approximately 1.2 x 5.8 x 6.5 cm not including the
finger-like projections inferiorly in the right lower quadrant

Small 1.7 cm rim enhancing fluid collection identified anterior left
lower quadrant on image 62/3.

Oval fluid collection in the cul-de-sac measures 4.8 x 2.4 cm today
compared to 4.4 x 3.6 cm on the study from 07/13/2019. There is some
posterior enhancement which may be enhancement of the adjacent
peritoneum as no definite anterior enhancing rim is evident.

Musculoskeletal: Diffuse body wall edema noted with open anterior
midline wound. No worrisome lytic or sclerotic osseous abnormality.
IMPRESSION: 1. Multiple rim enhancing fluid collections in the peritoneal cavity
of the lower abdomen and pelvis, compatible with evolving abscess
pockets. Some of these collections appear better organized with more
discrete rim enhancement on today's study compared to 07/13/2019.
2. Mild distention of fluid-filled left abdominal small bowel loops,
stable to minimally decreased in the interval.
3. Diffuse body wall edema with open anterior midline wound.
4. Stable small to moderate bilateral pleural effusions with
bilateral lower lobe collapse/consolidation.
5. Cholelithiasis.
6. Nonobstructing right renal stones.
7. Stable 14 mm left adrenal nodule, compatible with adenoma on
prior imaging.

## 2020-04-05 ENCOUNTER — Telehealth (INDEPENDENT_AMBULATORY_CARE_PROVIDER_SITE_OTHER): Payer: Medicaid Other | Admitting: Psychiatry

## 2020-04-05 ENCOUNTER — Other Ambulatory Visit: Payer: Self-pay

## 2020-04-05 ENCOUNTER — Encounter (HOSPITAL_COMMUNITY): Payer: Self-pay | Admitting: Psychiatry

## 2020-04-05 DIAGNOSIS — F314 Bipolar disorder, current episode depressed, severe, without psychotic features: Secondary | ICD-10-CM

## 2020-04-05 MED ORDER — TRAZODONE HCL 50 MG PO TABS: 50.0000 mg | ORAL_TABLET | Freq: Every day | ORAL | 2 refills | Status: DC

## 2020-04-05 MED ORDER — GABAPENTIN 300 MG PO CAPS: 300.0000 mg | ORAL_CAPSULE | Freq: Three times a day (TID) | ORAL | 2 refills | Status: DC

## 2020-04-05 MED ORDER — HYDROXYZINE HCL 10 MG PO TABS: 10.0000 mg | ORAL_TABLET | Freq: Three times a day (TID) | ORAL | 2 refills | Status: DC | PRN

## 2020-04-05 MED ORDER — RISPERIDONE 2 MG PO TABS: ORAL_TABLET | ORAL | 2 refills | Status: DC
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
## 2020-04-06 ENCOUNTER — Telehealth (HOSPITAL_COMMUNITY): Payer: Self-pay | Admitting: *Deleted

## 2020-04-06 NOTE — Telephone Encounter (Signed)
Prior authorization approved for patients Risperidone. [place] #[id number]. Pharmacy notified.

## 2020-05-25 ENCOUNTER — Telehealth (INDEPENDENT_AMBULATORY_CARE_PROVIDER_SITE_OTHER): Payer: Medicaid Other | Admitting: Primary Care

## 2020-05-25 ENCOUNTER — Other Ambulatory Visit: Payer: Self-pay

## 2020-05-25 ENCOUNTER — Encounter (INDEPENDENT_AMBULATORY_CARE_PROVIDER_SITE_OTHER): Payer: Self-pay | Admitting: Primary Care

## 2020-05-25 DIAGNOSIS — R32 Unspecified urinary incontinence: Secondary | ICD-10-CM | POA: Diagnosis not present

## 2020-05-25 MED ORDER — OXYBUTYNIN CHLORIDE ER 10 MG PO TB24: 10.0000 mg | ORAL_TABLET | Freq: Every day | ORAL | 1 refills | Status: DC

## 2020-05-25 NOTE — Progress Notes (Signed)
Pt is aware that she has to urinate but does not make it to the bathroom

## 2020-05-29 NOTE — Progress Notes (Addendum)
Virtual Visit via Telephone Note  I connected with Theresa Mccoy on 05/29/20 at 11:10 AM EST by telephone and verified that I am speaking with the correct person using two identifiers.  Location: Patient:home  Provider: Kerin Perna @ home    I discussed the limitations, risks, security and privacy concerns of performing an evaluation and management service by telephone and the availability of in person appointments. I also discussed with the patient that there may be a patient responsible charge related to this service. The patient expressed understanding and agreed to proceed.   History of Present Illness: [name] is a 39 year old who is having a vsit  for a chronic condition she is having uncontrolled she is unaware of when she needs to urinated and dribbling down her leg. Kegel exercise not working . Has been using incontinent supplies for years.  Past Medical History:  Diagnosis Date  . Anemia   . Depression   . Hyperthyroidism   . PTSD (post-traumatic stress disorder)   . SBO (small bowel obstruction) ([place]) 07/07/2019  . Thyroid disease    Current Outpatient Medications on File Prior to Visit  Medication Sig Dispense Refill  . acetaminophen (TYLENOL) 325 MG tablet Take 2 tablets (650 mg total) by mouth every 6 (six) hours as needed for mild pain.    . Blood Pressure Monitoring (BLOOD PRESSURE MONITOR AUTOMAT) DEVI 1 Device by Does not apply route daily. Automatic blood pressure cuff with regular cuff. Blood pressure to be monitored regularly at home. ICD-10 code: O09.90. 1 kit 0  . gabapentin (NEURONTIN) 300 MG capsule Take 1 capsule (300 mg total) by mouth 3 (three) times daily. 90 capsule 2  . hydrOXYzine (ATARAX/VISTARIL) 10 MG tablet Take 1 tablet (10 mg total) by mouth 3 (three) times daily as needed. 90 tablet 2  . ketoconazole (NIZORAL) 2 % shampoo Apply 1 application topically daily as needed for itching.     . risperiDONE (RISPERDAL) 2 MG tablet 1 in am  2 at hs 90 tablet 2  . traZODone (DESYREL) 50 MG tablet Take 1 tablet (50 mg total) by mouth at bedtime. 30 tablet 2   No current facility-administered medications on file prior to visit.   Theresa Mccoy was seen today for urinary incontinence.  Diagnoses and all orders for this visit:  Urinary incontinence, unspecified type Probable cause pelvic floor spinter lax  -     oxybutynin (DITROPAN XL) 10 MG 24 hr tablet; Take 1 tablet (10 mg total) by mouth at bedtime.   During this encounter. We spent 15 mins   Kerin Perna, NP

## 2020-06-11 ENCOUNTER — Ambulatory Visit: Payer: Medicaid Other | Admitting: Internal Medicine

## 2020-07-05 ENCOUNTER — Other Ambulatory Visit: Payer: Self-pay

## 2020-07-05 ENCOUNTER — Telehealth (HOSPITAL_COMMUNITY): Payer: Medicaid Other | Admitting: Psychiatry

## 2020-09-13 ENCOUNTER — Emergency Department (HOSPITAL_COMMUNITY)
Admission: EM | Admit: 2020-09-13 | Discharge: 2020-09-13 | Disposition: A | Discharge status: Home / Self Care | Payer: Medicaid Other | Attending: Emergency Medicine | Admitting: Emergency Medicine

## 2020-09-13 ENCOUNTER — Encounter (HOSPITAL_COMMUNITY): Payer: Self-pay

## 2020-09-13 DIAGNOSIS — F17210 Nicotine dependence, cigarettes, uncomplicated: Secondary | ICD-10-CM | POA: Insufficient documentation

## 2020-09-13 DIAGNOSIS — R519 Headache, unspecified: Secondary | ICD-10-CM | POA: Diagnosis not present

## 2020-09-13 DIAGNOSIS — I10 Essential (primary) hypertension: Secondary | ICD-10-CM | POA: Insufficient documentation

## 2020-09-13 MED ORDER — SODIUM CHLORIDE 0.9 % IV BOLUS
1000.0000 mL | Freq: Once | INTRAVENOUS | Status: AC
  Administered 2020-09-13: 1000 mL via INTRAVENOUS

## 2020-09-13 MED ORDER — PROCHLORPERAZINE EDISYLATE 10 MG/2ML IJ SOLN
10.0000 mg | Freq: Once | INTRAMUSCULAR | Status: AC
  Administered 2020-09-13: 10 mg via INTRAVENOUS
  Filled 2020-09-13: qty 2

## 2020-09-13 MED ORDER — DIPHENHYDRAMINE HCL 50 MG/ML IJ SOLN
25.0000 mg | Freq: Once | INTRAMUSCULAR | Status: AC
  Administered 2020-09-13: 25 mg via INTRAVENOUS
  Filled 2020-09-13: qty 1

## 2020-09-13 MED ORDER — KETOROLAC TROMETHAMINE 15 MG/ML IJ SOLN
15.0000 mg | Freq: Once | INTRAMUSCULAR | Status: AC
  Administered 2020-09-13: 15 mg via INTRAVENOUS
  Filled 2020-09-13: qty 1

## 2020-09-13 NOTE — ED Provider Notes (Signed)
[facility] DEPT Provider Note   CSN: [id number] Arrival date & time: 09/13/20  0734     History Chief Complaint  Patient presents with  . Headache    Theresa Mccoy is a 40 y.o. female.  40 year old female with an extensive past medical history including bipolar, migraines presents to the ED with a chief complaint of headache which began around 1 AM this morning.  Patient reports she has been out of her mental health medications for the past few days, she does not recall where these medications have been as she has been having fights with her husband.  Does report a prior history of migraines, similar symptoms in nature.  Does states she had one episode of vomiting, thick yellow stuff which has now resolved.  Describes the headache as sharp pain to the frontal aspect with radiation to her whole forehead.  She has not taken any medication for improvement in her symptoms.  There is no tinges in her vision, does wear glasses at baseline.  No fever, no chest pain or shortness of breath.   The history is provided by the patient.       Past Medical History:  Diagnosis Date  . Anemia   . Depression   . Hyperthyroidism   . PTSD (post-traumatic stress disorder)   . SBO (small bowel obstruction) ([place]) 07/07/2019  . Thyroid disease     Patient Active Problem List   Diagnosis Date Noted  . Severe bipolar I disorder, current or most recent episode depressed ([place]) 04/05/2020  . Sepsis ([place]) 01/22/2020  . Hypotension 01/22/2020  . Hypernatremia 01/22/2020  . AKI (acute kidney injury) ([place]) 01/22/2020  . Adnexal mass 01/22/2020  . Renal calculus, right 01/22/2020  . Hypercalcemia 07/07/2019  . Primary hyperparathyroidism ([place]) 07/07/2019  . Urinary tract infection 07/02/2019  . SBO (small bowel obstruction) ([place]) 07/02/2019  . Supervision of high risk pregnancy, antepartum 05/17/2019  . Muscle cramps 02/02/2017  . Bipolar disorder, current episode  mixed, moderate ([place]) 02/02/2017  . Generalized abdominal pain 02/02/2017  . Generalized anxiety disorder 02/02/2017  . Essential hypertension 02/02/2017    Past Surgical History:  Procedure Laterality Date  . BOWEL RESECTION N/A 07/08/2019   Procedure: Small Bowel Resection;  Surgeon: Clovis Riley, MD;  Location: [facility];  Service: General;  Laterality: N/A;  . CESAREAN SECTION    . DILATION AND CURETTAGE OF UTERUS    . HAND SURGERY Left   . LAPAROTOMY N/A 07/08/2019   Procedure: EXPLORATORY LAPAROTOMY;  Surgeon: Clovis Riley, MD;  Location: [facility] OR;  Service: General;  Laterality: N/A;  . SALPINGECTOMY       OB History    Gravida  5   Para  2   Term  2   Preterm      AB  2   Living  2     SAB  2   IAB      Ectopic  0   Multiple      Live Births  2           History reviewed. No pertinent family history.  Social History   Tobacco Use  . Smoking status: Current Every Day Smoker    Packs/day: 0.25    Types: Cigarettes  . Smokeless tobacco: Never Used  Vaping Use  . Vaping Use: Never used  Substance Use Topics  . 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 Drug use: Not Currently  Types: Marijuana    Home Medications Prior to Admission medications   Medication Sig Start Date End Date Taking? Authorizing Provider  acetaminophen (TYLENOL) 325 MG tablet Take 2 tablets (650 mg total) by mouth every 6 (six) hours as needed for mild pain. 07/18/19   Meuth, Brooke A, PA-C  Blood Pressure Monitoring (BLOOD PRESSURE MONITOR AUTOMAT) DEVI 1 Device by Does not apply route daily. Automatic blood pressure cuff with regular cuff. Blood pressure to be monitored regularly at home. ICD-10 code: O09.90. 05/17/19   Donnamae Jude, MD  gabapentin (NEURONTIN) 300 MG capsule Take 1 capsule (300 mg total) by mouth 3 (three) times daily. 04/05/20   Salley Slaughter, NP  hydrOXYzine (ATARAX/VISTARIL) 10 MG tablet Take 1 tablet (10 mg total) by mouth 3 (three)  times daily as needed. 04/05/20   Salley Slaughter, NP  ketoconazole (NIZORAL) 2 % shampoo Apply 1 application topically daily as needed for itching.  12/13/19   [provider]  oxybutynin (DITROPAN XL) 10 MG 24 hr tablet Take 1 tablet (10 mg total) by mouth at bedtime. 05/25/20   Kerin Perna, NP  risperiDONE (RISPERDAL) 2 MG tablet 1 in am 2 at hs 04/05/20   Eulis Canner E, NP  traZODone (DESYREL) 50 MG tablet Take 1 tablet (50 mg total) by mouth at bedtime. 04/05/20   Salley Slaughter, NP    Allergies    Codeine  Review of Systems   Review of Systems  Constitutional: Negative for fever.  HENT: Negative for sore throat.   Eyes: Negative for photophobia.  Respiratory: Negative for shortness of breath.   Cardiovascular: Negative for chest pain.  Gastrointestinal: Negative for abdominal pain, nausea and vomiting.  Musculoskeletal: Negative for back pain.  Skin: Negative for pallor and wound.  Neurological: Positive for headaches. Negative for light-headedness.  All other systems reviewed and are negative.   Physical Exam Updated Vital Signs BP 124/83   Pulse 78   Temp 97.6 F (36.4 C) (Oral)   Resp 18   LMP 09/13/2020 (Approximate)   SpO2 100%   Physical Exam Vitals and nursing note reviewed.  Constitutional:      Appearance: She is well-developed.  HENT:     Head: Normocephalic and atraumatic.  Eyes:     Extraocular Movements: Extraocular movements intact.  Cardiovascular:     Rate and Rhythm: Normal rate.  Pulmonary:     Effort: Pulmonary effort is normal.  Abdominal:     Palpations: Abdomen is soft.  Musculoskeletal:     Cervical back: Normal range of motion.  Skin:    General: Skin is warm and dry.  Neurological:     Mental Status: She is alert and oriented to person, place, and time.     Cranial Nerves: No cranial nerve deficit, dysarthria or facial asymmetry.     Sensory: Sensation is intact.     Motor: No weakness or abnormal muscle  tone.     Coordination: Finger-Nose-Finger Test normal.     Gait: Gait is intact.     Comments: Alert, oriented, thought content appropriate. Speech fluent without evidence of aphasia. Able to follow 2 step commands without difficulty.  Cranial Nerves:  II:  Peripheral visual fields grossly normal, pupils, round, reactive to light III,IV, VI: ptosis not present, extra-ocular motions intact bilaterally  V,VII: smile symmetric, facial light touch sensation equal VIII: hearing grossly normal bilaterally  IX,X: midline uvula rise  XI: bilateral shoulder shrug equal and strong XII: midline tongue  extension  Motor:  5/5 in upper and lower extremities bilaterally including strong and equal grip strength and dorsiflexion/plantar flexion Sensory: light touch normal in all extremities.  Cerebellar: normal finger-to-nose with bilateral upper extremities, pronator drift negative Gait: normal gait and balance   Psychiatric:        Mood and Affect: Mood is anxious.        Speech: Speech is delayed.        Behavior: Behavior is withdrawn.     ED Results / Procedures / Treatments   Labs (all labs ordered are listed, but only abnormal results are displayed) Labs Reviewed - No data to display  EKG None  Radiology No results found.  Procedures Procedures   Medications Ordered in ED Medications  prochlorperazine (COMPAZINE) injection 10 mg (10 mg Intravenous Given 09/13/20 1143)  diphenhydrAMINE (BENADRYL) injection 25 mg (25 mg Intravenous Given 09/13/20 1143)  sodium chloride 0.9 % bolus 1,000 mL (0 mLs Intravenous Stopped 09/13/20 1424)  ketorolac (TORADOL) 15 MG/ML injection 15 mg (15 mg Intravenous Given 09/13/20 1410)    ED Course  I have reviewed the triage vital signs and the nursing notes.  Pertinent labs & imaging results that were available during my care of the patient were reviewed by me and considered in my medical decision making (see chart for details).    MDM  Rules/Calculators/A&P     Patient with a past medical history of migraines presents to the ED with a chief complaint of headache sudden onset yesterday.  Does report this occurred after she ate some chicken fingers, had one episode of vomiting, reports she vomited the chicken tenders, this is her second night eating days.  Headache is described as sharp originating at the front of her head with radiation throughout the front.  Prior history of migraines reports she has not taken medication for this in several years.  She has not taken any medication for her symptoms, does report her Excedrin usually helps her migraines, however she "do not have any money for Excedrin".  She arrived in the ED with stable vital signs, she is afebrile.  There is no neck pain, no changes in her vision, no red flags, no trauma.  Neuro exam is unremarkable.  We discussed symptomatic treatment at this time.  Patient reports improvement in headache after headache cocktail.  Patient understands and agrees to management, patient is aware that she will need to follow-up with PCP pending psychiatric meds.  Precautions discussed at length  Portions of this note were generated with Dragon dictation software. Dictation errors may occur despite best attempts at proofreading.  Final Clinical Impression(s) / ED Diagnoses Final diagnoses:  Bad headache    Rx / DC Orders ED Discharge Orders    None       Janeece Fitting, PA-C 09/13/20 1501    Breck Coons, MD 09/14/20 [phone]

## 2020-09-13 NOTE — Discharge Instructions (Addendum)
You will need to follow-up with primary care physician in order to obtain your medications for your mental health disorders.  If you experience any changes in vision, worsening symptoms you may return to the emergency department.

## 2020-09-13 NOTE — ED Triage Notes (Signed)
Pt presents with c/o headache for the past day. Pt reports that the pain is making her nauseated. Pt reports a hx of headaches in the past.

## 2020-09-14 ENCOUNTER — Telehealth: Payer: Self-pay | Admitting: *Deleted

## 2020-09-14 NOTE — Telephone Encounter (Signed)
Transition Care Management Follow-up Telephone Call  Date of discharge and from where: 09/13/2020 - [facility]  How have you been since you were released from the hospital? "Headache isn't as bad"  Any questions or concerns? No  Items Reviewed:  Did the pt receive and understand the discharge instructions provided? Yes   Medications obtained and verified? Yes   Other? No   Any new allergies since your discharge? No   Dietary orders reviewed? Yes  Do you have support at home? Yes   Home Care and Equipment/Supplies: Were home health services ordered? not applicable If so, what is the name of the agency? N/A  Has the agency set up a time to come to the patient's home? not applicable Were any new equipment or medical supplies ordered?  No What is the name of the medical supply agency? N/A Were you able to get the supplies/equipment? not applicable Do you have any questions related to the use of the equipment or supplies? No  Functional Questionnaire: (I = Independent and D = Dependent) ADLs: I  Bathing/Dressing- I  Meal Prep- I  Eating- I  Maintaining continence- I  Transferring/Ambulation- I  Managing Meds- I  Follow up appointments reviewed:   PCP Hospital f/u appt confirmed? No    Specialist Hospital f/u appt confirmed? No    Are transportation arrangements needed? No   If their condition worsens, is the pt aware to call PCP or go to the Emergency Dept.? Yes  Was the patient provided with contact information for the PCP's office or ED? Yes  Was to pt encouraged to call back with questions or concerns? Yes

## 2020-10-29 ENCOUNTER — Emergency Department (HOSPITAL_BASED_OUTPATIENT_CLINIC_OR_DEPARTMENT_OTHER)
Admission: EM | Admit: 2020-10-29 | Discharge: 2020-10-30 | Disposition: A | Discharge status: Home / Self Care | Payer: Medicaid Other | Attending: Emergency Medicine | Admitting: Emergency Medicine

## 2020-10-29 ENCOUNTER — Other Ambulatory Visit: Payer: Self-pay

## 2020-10-29 ENCOUNTER — Encounter (HOSPITAL_BASED_OUTPATIENT_CLINIC_OR_DEPARTMENT_OTHER): Payer: Self-pay

## 2020-10-29 DIAGNOSIS — M542 Cervicalgia: Secondary | ICD-10-CM | POA: Insufficient documentation

## 2020-10-29 DIAGNOSIS — Z711 Person with feared health complaint in whom no diagnosis is made: Secondary | ICD-10-CM | POA: Diagnosis not present

## 2020-10-29 DIAGNOSIS — F17210 Nicotine dependence, cigarettes, uncomplicated: Secondary | ICD-10-CM | POA: Diagnosis not present

## 2020-10-29 NOTE — ED Provider Notes (Signed)
[facility] Provider Note  CSN: [id number] Arrival date & time: 10/29/20 2053    History Chief Complaint  Patient presents with  . Neck Pain  . Back Pain    HPI  Theresa Mccoy is a 40 y.o. female with history of bipolar disorder and hyperthyroidism (she thinks it might be hypothyroid instead) who presnts for evaluation of neck pain. She states she feels lumps in her neck and it hurts to swallow. This started today. She has a variety of vague complaints, states she has been to many doctors and no one can tell her what's wrong. She is scheduled to see [facility] in 2 days for her mental health diagnoses but does not have a PCP.    Past Medical History:  Diagnosis Date  . Anemia   . Depression   . Hyperthyroidism   . PTSD (post-traumatic stress disorder)   . SBO (small bowel obstruction) ([place]) 07/07/2019  . Thyroid disease     Past Surgical History:  Procedure Laterality Date  . BOWEL RESECTION N/A 07/08/2019   Procedure: Small Bowel Resection;  Surgeon: Clovis Riley, MD;  Location: [facility];  Service: General;  Laterality: N/A;  . CESAREAN SECTION    . DILATION AND CURETTAGE OF UTERUS    . HAND SURGERY Left   . LAPAROTOMY N/A 07/08/2019   Procedure: EXPLORATORY LAPAROTOMY;  Surgeon: Clovis Riley, MD;  Location: [facility];  Service: General;  Laterality: N/A;  . SALPINGECTOMY      No family history on file.  Social History   Tobacco Use  . Smoking status: Current Every Day Smoker    Packs/day: 0.25    Types: Cigarettes  . Smokeless tobacco: Never Used  Vaping Use  . Vaping Use: Never used  Substance Use Topics  . 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 Drug use: Not Currently    Types: Marijuana    Comment: Occasionally      Home Medications Prior to Admission medications   Medication Sig Start Date End Date Taking? Authorizing Provider  acetaminophen (TYLENOL) 325 MG tablet Take 2 tablets (650 mg total) by mouth  every 6 (six) hours as needed for mild pain. 07/18/19   Meuth, Brooke A, PA-C  Blood Pressure Monitoring (BLOOD PRESSURE MONITOR AUTOMAT) DEVI 1 Device by Does not apply route daily. Automatic blood pressure cuff with regular cuff. Blood pressure to be monitored regularly at home. ICD-10 code: O09.90. 05/17/19   Donnamae Jude, MD  gabapentin (NEURONTIN) 300 MG capsule Take 1 capsule (300 mg total) by mouth 3 (three) times daily. 04/05/20   Salley Slaughter, NP  hydrOXYzine (ATARAX/VISTARIL) 10 MG tablet Take 1 tablet (10 mg total) by mouth 3 (three) times daily as needed. 04/05/20   Salley Slaughter, NP  ketoconazole (NIZORAL) 2 % shampoo Apply 1 application topically daily as needed for itching.  12/13/19   [provider]  oxybutynin (DITROPAN XL) 10 MG 24 hr tablet Take 1 tablet (10 mg total) by mouth at bedtime. 05/25/20   Kerin Perna, NP  risperiDONE (RISPERDAL) 2 MG tablet 1 in am 2 at hs 04/05/20   Eulis Canner E, NP  traZODone (DESYREL) 50 MG tablet Take 1 tablet (50 mg total) by mouth at bedtime. 04/05/20   Salley Slaughter, NP     Allergies    Codeine   Review of Systems   Review of Systems A comprehensive review of systems was completed and  negative except as noted in HPI.    Physical Exam BP (!) 137/106 (BP Location: Right Arm) Comment: Pt moved her arm while BP was obtained.  Pulse 73   Temp 98.4 F (36.9 C) (Temporal)   Resp 18   Ht 5' (1.524 m)   Wt 56.7 kg   SpO2 100%   BMI 24.41 kg/m   Physical Exam Vitals and nursing note reviewed.  Constitutional:      Appearance: Normal appearance.  HENT:     Head: Normocephalic and atraumatic.     Nose: Nose normal.     Mouth/Throat:     Mouth: Mucous membranes are moist.     Pharynx: No oropharyngeal exudate or posterior oropharyngeal erythema.  Eyes:     Extraocular Movements: Extraocular movements intact.     Conjunctiva/sclera: Conjunctivae normal.  Cardiovascular:     Rate and Rhythm:  Normal rate.  Pulmonary:     Effort: Pulmonary effort is normal.     Breath sounds: Normal breath sounds.  Abdominal:     General: Abdomen is flat.     Palpations: Abdomen is soft.     Tenderness: There is no abdominal tenderness.  Musculoskeletal:        General: No swelling. Normal range of motion.     Cervical back: Neck supple. No tenderness.  Lymphadenopathy:     Cervical: No cervical adenopathy.  Skin:    General: Skin is warm and dry.  Neurological:     General: No focal deficit present.     Mental Status: She is alert.  Psychiatric:        Mood and Affect: Mood normal.      ED Results / Procedures / Treatments   Labs (all labs ordered are listed, but only abnormal results are displayed) Labs Reviewed - No data to display  EKG None   Radiology No results found.  Procedures Procedures  Medications Ordered in the ED Medications - No data to display   MDM Rules/Calculators/A&P MDM Patient with numerous vague complaints, difficult to determine which is her true primary reason for coming today but she seems most concerned about her neck. She has a normal exam. The 'lumps' she feels are her SCM muscles. She was reassured that there does not appear to be any emergent medical condition. Discharge home, referred to [facility].  ED Course  I have reviewed the triage vital signs and the nursing notes.  Pertinent labs & imaging results that were available during my care of the patient were reviewed by me and considered in my medical decision making (see chart for details).     Final Clinical Impression(s) / ED Diagnoses Final diagnoses:  Feared complaint without diagnosis    Rx / DC Orders ED Discharge Orders    None       Truddie Hidden, MD 10/29/20 2349

## 2020-10-29 NOTE — ED Triage Notes (Addendum)
Patient BIB [facility] from Home with Neck and Back Pain.  Patient has Hx of Hypothyroidism for which patient takes several medications for.  VSS, Ambulatory, GCS 15.

## 2020-10-30 NOTE — ED Notes (Signed)
This RN presented the AVS utilizing Teachback Method. Patient verbalizes understanding of Discharge Instructions. Opportunity for Questioning and Answers were provided. Patient Discharged from ED ambulatory to Home.   

## 2020-10-30 NOTE — ED Notes (Signed)
This RN arranged a Cab for this patient to be transported home. Patient made aware that Cab is en route.

## 2020-10-31 ENCOUNTER — Ambulatory Visit (INDEPENDENT_AMBULATORY_CARE_PROVIDER_SITE_OTHER): Payer: Self-pay | Admitting: *Deleted

## 2020-10-31 NOTE — Telephone Encounter (Signed)
Pt called with complaints of shortness of breath that PM 10/30/20; it occurs with exertion; she denies fever and cough; the pt says all of her "muscles are tight mainly in her chest, arms, back, and legs"; recommendations made per nurse triage protocol; the pt sees Juluis Mire, [facility]; this provider has no availability within the timeframe per protocol; the pt was advised to be consider being  evaluated at Urgent Care or ED; she would like to be seen by her PCP because she has other issues to discuss; explained to pt that this recommendation is related to her  shortness of breath; she would like to be contacted if there are any cancellations today; her best contact number is [phone] or alternate number [phone]; the pt would also like for someone to contact her regarding her need for a prescription for adult diapers; will route to office for notification. Reason for Disposition . [1] MILD difficulty breathing (e.g., minimal/no SOB at rest, SOB with walking, pulse <100) AND [2] NEW-onset or WORSE than normal  Answer Assessment - Initial Assessment Questions 1. RESPIRATORY STATUS: "Describe your breathing?" (e.g., wheezing, shortness of breath, unable to speak, severe coughing)      Shortness of breath 2. ONSET: "When did this breathing problem begin?"      PM 10/30/20 3. PATTERN "Does the difficult breathing come and go, or has it been constant since it started?"      Intermittent, occurs when walking 4. SEVERITY: "How bad is your breathing?" (e.g., mild, moderate, severe)    - MILD: No SOB at rest, mild SOB with walking, speaks normally in sentences, can lay down, no retractions, pulse < 100.    - MODERATE: SOB at rest, SOB with minimal exertion and prefers to sit, cannot lie down flat, speaks in phrases, mild retractions, audible wheezing, pulse 100-120.    - SEVERE: Very SOB at rest, speaks in single words, struggling to breathe, sitting hunched forward, retractions, pulse >  120    mild 5. RECURRENT SYMPTOM: "Have you had difficulty breathing before?" If Yes, ask: "When was the last time?" and "What happened that time?"      no 6. CARDIAC HISTORY: "Do you have any history of heart disease?" (e.g., heart attack, angina, bypass surgery, angioplasty)   HTN 7. LUNG HISTORY: "Do you have any history of lung disease?"  (e.g., pulmonary embolus, asthma, emphysema)    no 8. CAUSE: "What do you think is causing the breathing problem?"      "all muscles tight"; chest, arm, back, chest 9. OTHER SYMPTOMS: "Do you have any other symptoms? (e.g., dizziness, runny nose, cough, chest pain, fever)   Muscle tightness in chest, legs, back, and arms 10. PREGNANCY: "Is there any chance you are pregnant?" "When was your last menstrual period?"     Yes, irregular periods 11. TRAVEL: "Have you traveled out of the country in the last month?" (e.g., travel history, exposures)   no  Protocols used: BREATHING DIFFICULTY-A-AH

## 2020-10-31 NOTE — Telephone Encounter (Signed)
Addendum to previous note: the pt was seen in the ED on 10/29/20 and was told to follow up with her PCP and [facility]; the pt says she has an appt with [facility] 10/30/20 at 1400; please see ED note.

## 2020-11-21 ENCOUNTER — Ambulatory Visit (INDEPENDENT_AMBULATORY_CARE_PROVIDER_SITE_OTHER): Payer: Medicaid Other | Admitting: Primary Care

## 2020-11-21 ENCOUNTER — Other Ambulatory Visit: Payer: Self-pay

## 2020-11-21 ENCOUNTER — Encounter (INDEPENDENT_AMBULATORY_CARE_PROVIDER_SITE_OTHER): Payer: Self-pay | Admitting: Primary Care

## 2020-11-21 VITALS — BP 119/72 | HR 70 | Temp 98.1°F

## 2020-11-21 DIAGNOSIS — N926 Irregular menstruation, unspecified: Secondary | ICD-10-CM | POA: Diagnosis not present

## 2020-11-21 DIAGNOSIS — R300 Dysuria: Secondary | ICD-10-CM | POA: Diagnosis not present

## 2020-11-21 DIAGNOSIS — N6452 Nipple discharge: Secondary | ICD-10-CM

## 2020-11-21 LAB — POCT URINE PREGNANCY: Preg Test, Ur: NEGATIVE

## 2020-11-21 NOTE — Patient Instructions (Signed)

## 2020-11-21 NOTE — Progress Notes (Signed)
Established Patient Office Visit  Subjective:  Patient ID: Theresa Mccoy, female    DOB: May 15, 1981  Age: 40 y.o. MRN: [id number]  CC: No chief complaint on file.   HPI [name] presents is a 40 year old female concern she was pregnant  No LMP recorded. (Menstrual status: Irregular Periods). and has breast drainage . Pregnancy test negative   Past Medical History:  Diagnosis Date  . Anemia   . Depression   . Hyperthyroidism   . PTSD (post-traumatic stress disorder)   . SBO (small bowel obstruction) ([place]) 07/07/2019  . Thyroid disease     Past Surgical History:  Procedure Laterality Date  . BOWEL RESECTION N/A 07/08/2019   Procedure: Small Bowel Resection;  Surgeon: Clovis Riley, MD;  Location: [place];  Service: General;  Laterality: N/A;  . CESAREAN SECTION    . DILATION AND CURETTAGE OF UTERUS    . HAND SURGERY Left   . LAPAROTOMY N/A 07/08/2019   Procedure: EXPLORATORY LAPAROTOMY;  Surgeon: Clovis Riley, MD;  Location: [facility] OR;  Service: General;  Laterality: N/A;  . SALPINGECTOMY      No family history on file.  Social History   Socioeconomic History  . Marital status: Married    Spouse name: Not on file  . Number of children: 2  . Years of education: Not on file  . Highest education level: GED or equivalent  Occupational History  . Occupation: Unemployed  Tobacco Use  . Smoking status: Current Every Day Smoker    Packs/day: 0.25    Types: Cigarettes  . Smokeless tobacco: Never Used  Vaping Use  . Vaping Use: Never used  Substance and Sexual Activity  . 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 Drug use: Not Currently    Types: Marijuana    Comment: Occasionally   . Sexual activity: Yes    Birth control/protection: None  Other Topics Concern  . Not on file  Social History Narrative   Pt stated that she lives in [place] with her fiance and two children, that she recently moved from [place], and also that she is pregnant.  She  also stated that she is unemployed.   Social Determinants of Health   Financial Resource Strain: Not on file  Food Insecurity: Not on file  Transportation Needs: Not on file  Physical Activity: Not on file  Stress: Not on file  Social Connections: Not on file  Intimate Partner Violence: Not on file    Outpatient Medications Prior to Visit  Medication Sig Dispense Refill  . acetaminophen (TYLENOL) 325 MG tablet Take 2 tablets (650 mg total) by mouth every 6 (six) hours as needed for mild pain.    . Blood Pressure Monitoring (BLOOD PRESSURE MONITOR AUTOMAT) DEVI 1 Device by Does not apply route daily. Automatic blood pressure cuff with regular cuff. Blood pressure to be monitored regularly at home. ICD-10 code: O09.90. 1 kit 0  . gabapentin (NEURONTIN) 300 MG capsule Take 1 capsule (300 mg total) by mouth 3 (three) times daily. 90 capsule 2  . hydrOXYzine (ATARAX/VISTARIL) 10 MG tablet Take 1 tablet (10 mg total) by mouth 3 (three) times daily as needed. 90 tablet 2  . ketoconazole (NIZORAL) 2 % shampoo Apply 1 application topically daily as needed for itching.     Marland Kitchen oxybutynin (DITROPAN XL) 10 MG 24 hr tablet Take 1 tablet (10 mg total) by mouth at bedtime. 30 tablet 1  . risperiDONE (  RISPERDAL) 2 MG tablet 1 in am 2 at hs 90 tablet 2  . traZODone (DESYREL) 50 MG tablet Take 1 tablet (50 mg total) by mouth at bedtime. 30 tablet 2   No facility-administered medications prior to visit.    Allergies  Allergen Reactions  . Codeine Other (See Comments)    Constipation     ROS Review of Systems  Constitutional: Positive for unexpected weight change.       Gain  Gastrointestinal: Positive for nausea.       R/o pregnancy   Endocrine:       Breast drainage   Genitourinary: Positive for dysuria and menstrual problem.  All other systems reviewed and are negative.     Objective:    Physical Exam Vitals reviewed.  Constitutional:      Appearance: Normal appearance.  HENT:      Right Ear: Tympanic membrane and external ear normal.     Left Ear: Tympanic membrane and external ear normal.     Nose: Nose normal.  Eyes:     Extraocular Movements: Extraocular movements intact.     Pupils: Pupils are equal, round, and reactive to light.  Cardiovascular:     Rate and Rhythm: Normal rate and regular rhythm.  Pulmonary:     Effort: Pulmonary effort is normal.     Breath sounds: Normal breath sounds.  Chest:       Comments: Bilateral tenderness Abdominal:     General: Bowel sounds are normal. There is distension.     Palpations: Abdomen is soft.  Musculoskeletal:        General: Normal range of motion.     Cervical back: Normal range of motion.  Skin:    General: Skin is warm and dry.  Neurological:     Mental Status: She is alert and oriented to person, place, and time.  Psychiatric:        Mood and Affect: Mood normal.        Behavior: Behavior normal.        Thought Content: Thought content normal.        Judgment: Judgment normal.     There were no vitals taken for this visit. Wt Readings from Last 3 Encounters:  10/29/20 125 lb (56.7 kg)  02/07/20 128 lb 3.2 oz (58.2 kg)  01/27/20 126 lb 8.7 oz (57.4 kg)     Health Maintenance Due  Topic Date Due  . COVID-19 Vaccine (1) Never done  . Hepatitis C Screening  Never done  . PAP SMEAR-Modifier  05/05/2020    There are no preventive care reminders to display for this patient.  Lab Results  Component Value Date   TSH 1.680 01/25/2020   Lab Results  Component Value Date   WBC 6.3 02/07/2020   HGB 9.4 (L) 02/07/2020   HCT 27.7 (L) 02/07/2020   MCV 93 02/07/2020   PLT 275 02/07/2020   Lab Results  Component Value Date   NA 140 02/07/2020   K 4.2 02/07/2020   CO2 24 02/07/2020   GLUCOSE 88 02/07/2020   BUN 10 02/07/2020   CREATININE 0.69 02/07/2020   BILITOT 0.2 02/07/2020   ALKPHOS 158 (H) 02/07/2020   AST 9 02/07/2020   ALT 12 02/07/2020   PROT 5.7 (L) 02/07/2020   ALBUMIN 2.9  (L) 02/07/2020   CALCIUM 9.3 02/07/2020   ANIONGAP 5 01/27/2020   No results found for: CHOL No results found for: HDL No results found for: LDLCALC No results  found for: TRIG No results found for: CHOLHDL Lab Results  Component Value Date   HGBA1C 5.3 02/02/2017      Assessment & Plan:  Diagnoses and all orders for this visit:  Dysuria -     Urine Culture  Irregular menstrual cycle -     POCT urine pregnancy negative   Breast discharge See PE very tender -     Anaerobic and Aerobic Culture -     MM Digital Diagnostic Bilat; Future Taught SBE and return demonstration    Follow-up: No follow-ups on file.    Kerin Perna, NP

## 2020-11-22 ENCOUNTER — Telehealth: Payer: Self-pay

## 2020-11-22 NOTE — Telephone Encounter (Signed)
Copied from [place] [id number]. Topic: General - Other >> Nov 22, 2020  3:42 PM Tessa Lerner A wrote: Reason for CRM: Patient was told by their PCP on 11/21/20 that they would need to schedule two additional visits with specialists   Patient was directed to get additional imaging but is unaware of where they're supposed to go or if they'll need a referral   Please contact to further advise when possible.

## 2020-11-23 ENCOUNTER — Other Ambulatory Visit: Payer: Self-pay | Admitting: Primary Care

## 2020-11-23 DIAGNOSIS — N6452 Nipple discharge: Secondary | ICD-10-CM

## 2020-11-23 NOTE — Telephone Encounter (Signed)
Provided patient number to the breast center to call and schedule mammogram.

## 2020-11-25 LAB — ANAEROBIC AND AEROBIC CULTURE

## 2020-11-26 ENCOUNTER — Other Ambulatory Visit: Payer: Self-pay | Admitting: Family Medicine

## 2020-11-26 LAB — URINE CULTURE

## 2020-11-26 MED ORDER — CEPHALEXIN 500 MG PO CAPS: 500.0000 mg | ORAL_CAPSULE | Freq: Two times a day (BID) | ORAL | 0 refills | Status: DC

## 2020-11-27 ENCOUNTER — Encounter (INDEPENDENT_AMBULATORY_CARE_PROVIDER_SITE_OTHER): Payer: Self-pay | Admitting: Primary Care

## 2020-11-28 ENCOUNTER — Other Ambulatory Visit: Payer: Self-pay | Admitting: Family Medicine

## 2020-11-28 ENCOUNTER — Telehealth: Payer: Self-pay

## 2020-11-28 NOTE — Telephone Encounter (Signed)
Patient name and DOB has been verified Patient was informed of lab results. Patient had no questions.  

## 2020-11-28 NOTE — Telephone Encounter (Signed)
-----   Message from Charlott Rakes, MD sent at 11/26/2020  1:27 PM EDT ----- Please inform her that her breast culture did not grow any organisms.  Urine was positive for bacteria causing UTI and I have sent prescription for an antibiotic to her pharmacy. Thanks

## 2021-01-02 ENCOUNTER — Other Ambulatory Visit: Payer: Medicaid Other

## 2021-01-08 ENCOUNTER — Other Ambulatory Visit (INDEPENDENT_AMBULATORY_CARE_PROVIDER_SITE_OTHER): Payer: Self-pay | Admitting: Primary Care

## 2021-01-08 DIAGNOSIS — R32 Unspecified urinary incontinence: Secondary | ICD-10-CM

## 2021-01-18 ENCOUNTER — Telehealth: Payer: Self-pay | Admitting: Primary Care

## 2021-01-18 NOTE — Telephone Encounter (Signed)
Copied from [place] [id number]. Topic: General - Call Back - No Documentation >> Jan 17, 2021  3:15 PM Erick Blinks wrote: Richardson Landry calling from [employer] wants to know status of incontinence supply/approval forms. Please advise  [phone] extension [phone]

## 2021-01-22 NOTE — Telephone Encounter (Signed)
Contacted steve with [employer]. After conversation it was realized that request from this company was never received. And patient was already receiving supplies through [employer]. [employer] will close patient account as she can not receive supplies from 2 companies.

## 2021-01-25 ENCOUNTER — Inpatient Hospital Stay: Admission: RE | Admit: 2021-01-25 | Payer: Medicaid Other | Source: Ambulatory Visit

## 2021-01-25 ENCOUNTER — Other Ambulatory Visit: Payer: Medicaid Other

## 2021-01-25 ENCOUNTER — Other Ambulatory Visit (INDEPENDENT_AMBULATORY_CARE_PROVIDER_SITE_OTHER): Payer: Self-pay | Admitting: Primary Care

## 2021-01-25 DIAGNOSIS — R32 Unspecified urinary incontinence: Secondary | ICD-10-CM

## 2021-01-28 ENCOUNTER — Other Ambulatory Visit (HOSPITAL_COMMUNITY): Payer: Self-pay | Admitting: Psychiatry

## 2021-01-28 DIAGNOSIS — F314 Bipolar disorder, current episode depressed, severe, without psychotic features: Secondary | ICD-10-CM

## 2021-02-07 ENCOUNTER — Emergency Department (HOSPITAL_COMMUNITY)
Admission: EM | Admit: 2021-02-07 | Discharge: 2021-02-08 | Disposition: A | Discharge status: Home / Self Care | Payer: 59 | Attending: Emergency Medicine | Admitting: Emergency Medicine

## 2021-02-07 ENCOUNTER — Other Ambulatory Visit: Payer: Self-pay

## 2021-02-07 DIAGNOSIS — F17210 Nicotine dependence, cigarettes, uncomplicated: Secondary | ICD-10-CM | POA: Diagnosis not present

## 2021-02-07 DIAGNOSIS — I10 Essential (primary) hypertension: Secondary | ICD-10-CM

## 2021-02-07 DIAGNOSIS — R519 Headache, unspecified: Secondary | ICD-10-CM | POA: Diagnosis present

## 2021-02-07 NOTE — ED Triage Notes (Signed)
EMS reports pt c/o headache x 3 days. Denies N/V and dizziness. Hx of HTN.

## 2021-02-07 NOTE — ED Provider Notes (Signed)
Emergency Medicine Provider Triage Evaluation Note  Kerryn Cadwallader , a 40 y.o. female  was evaluated in triage.  Pt complains of headaches.  Patient states she began experiencing a 10/10, constant, frontal headache about 4 days ago.  Symptoms have been persistent.  Reports initial nausea/vomiting which has since resolved.  No chest pain, shortness of breath, visual changes, numbness, or weakness.  Physical Exam  BP (!) 154/114 (BP Location: Left Arm)   Pulse 75   Temp 98 F (36.7 C) (Oral)   Resp 18   SpO2 100%  Gen:   Awake, no distress   Resp:  Normal effort  MSK:   Moves extremities without difficulty  Other:    Medical Decision Making  Medically screening exam initiated at 11:55 PM.  Appropriate orders placed.  Patzy Flournoy was informed that the remainder of the evaluation will be completed by another provider, this initial triage assessment does not replace that evaluation, and the importance of remaining in the ED until their evaluation is complete.   Rayna Sexton, PA-C [id number]    Delora Fuel, MD [id number] [phone]

## 2021-02-08 ENCOUNTER — Emergency Department (HOSPITAL_COMMUNITY): Payer: 59

## 2021-02-08 DIAGNOSIS — I10 Essential (primary) hypertension: Secondary | ICD-10-CM | POA: Diagnosis not present

## 2021-02-08 MED ORDER — DIPHENHYDRAMINE HCL 25 MG PO CAPS
50.0000 mg | ORAL_CAPSULE | Freq: Once | ORAL | Status: AC
  Administered 2021-02-08: 50 mg via ORAL
  Filled 2021-02-08: qty 2

## 2021-02-08 MED ORDER — DEXAMETHASONE SODIUM PHOSPHATE 10 MG/ML IJ SOLN
10.0000 mg | Freq: Once | INTRAMUSCULAR | Status: AC
  Administered 2021-02-08: 10 mg via INTRAMUSCULAR
  Filled 2021-02-08: qty 1

## 2021-02-08 MED ORDER — PROCHLORPERAZINE EDISYLATE 10 MG/2ML IJ SOLN
10.0000 mg | Freq: Once | INTRAMUSCULAR | Status: AC
  Administered 2021-02-08: 10 mg via INTRAMUSCULAR
  Filled 2021-02-08: qty 2

## 2021-02-08 MED ORDER — KETOROLAC TROMETHAMINE 60 MG/2ML IM SOLN
60.0000 mg | Freq: Once | INTRAMUSCULAR | Status: AC
  Administered 2021-02-08: 60 mg via INTRAMUSCULAR
  Filled 2021-02-08: qty 2

## 2021-02-08 NOTE — Discharge Instructions (Addendum)
1. Medications: usual home medications 2. Treatment: rest, drink plenty of fluids,  3. Follow Up: Please followup with your primary doctor and Neurologist in the next week for discussion of your diagnoses and further evaluation after today's visit; if you do not have a primary care doctor use the resource guide provided to find one; Please return to the ER for return of headache, new or worsening symptoms

## 2021-02-08 NOTE — ED Provider Notes (Signed)
[facility] DEPT Provider Note   CSN: [id number] Arrival date & time: 02/07/21  2347     History Chief Complaint  Patient presents with   Headache    Theresa Mccoy is a 40 y.o. female presents to the emergency department with complaints of gradual onset persistent frontal headache onset 4 days ago.  Patient denies instigating event including falls or trauma.  Reports pain is throbbing in nature.  It is not associated with vision changes, numbness, tingling, weakness.  Patient reports she did have some vomiting on Tuesday but this has resolved and she has been able to eat and drink since that time.  Denies fevers, chills, neck pain, neck stiffness.  Patient states she does have a history of migraine headaches but this headache feels different.  Her standard migraines seem to move location however this pain has been constant.  Patient normally takes Excedrin for her migraines but has been unable to afford this as of late.  She reports taking ibuprofen for this headache without significant relief.  Patient denies thunderclap headache, syncope with headache onset or other associated symptoms.  The history is provided by the patient and medical records. No language interpreter was used.      Past Medical History:  Diagnosis Date   Anemia    Depression    Hyperthyroidism    PTSD (post-traumatic stress disorder)    SBO (small bowel obstruction) ([place]) 07/07/2019   Thyroid disease     Patient Active Problem List   Diagnosis Date Noted   Severe bipolar I disorder, current or most recent episode depressed ([place]) 04/05/2020   Sepsis ([place]) 01/22/2020   Hypotension 01/22/2020   Hypernatremia 01/22/2020   AKI (acute kidney injury) ([place]) 01/22/2020   Adnexal mass 01/22/2020   Renal calculus, right 01/22/2020   Hypercalcemia 07/07/2019   Primary hyperparathyroidism ([place]) 07/07/2019   Urinary tract infection 07/02/2019   SBO (small bowel obstruction) ([place])  07/02/2019   Supervision of high risk pregnancy, antepartum 05/17/2019   Muscle cramps 02/02/2017   Bipolar disorder, current episode mixed, moderate ([place]) 02/02/2017   Generalized abdominal pain 02/02/2017   Generalized anxiety disorder 02/02/2017   Essential hypertension 02/02/2017    Past Surgical History:  Procedure Laterality Date   BOWEL RESECTION N/A 07/08/2019   Procedure: Small Bowel Resection;  Surgeon: Clovis Riley, MD;  Location: [facility];  Service: General;  Laterality: N/A;   CESAREAN SECTION     DILATION AND CURETTAGE OF UTERUS     HAND SURGERY Left    LAPAROTOMY N/A 07/08/2019   Procedure: EXPLORATORY LAPAROTOMY;  Surgeon: Clovis Riley, MD;  Location: [facility] OR;  Service: General;  Laterality: N/A;   SALPINGECTOMY       OB History     Gravida  5   Para  2   Term  2   Preterm      AB  2   Living  2      SAB  2   IAB      Ectopic  0   Multiple      Live Births  2           No family history on file.  Social History   Tobacco Use   Smoking status: Every Day    Packs/day: 0.25    Types: Cigarettes   Smokeless tobacco: Never  Vaping Use   Vaping Use: Never used  Substance Use Topics   Alcohol use: No    Comment:  sober since 02-2016   Drug use: Not Currently    Types: Marijuana    Comment: Occasionally     Home Medications Prior to Admission medications   Medication Sig Start Date End Date Taking? Authorizing Provider  acetaminophen (TYLENOL) 325 MG tablet Take 2 tablets (650 mg total) by mouth every 6 (six) hours as needed for mild pain. 07/18/19   Meuth, Brooke A, PA-C  Blood Pressure Monitoring (BLOOD PRESSURE MONITOR AUTOMAT) DEVI 1 Device by Does not apply route daily. Automatic blood pressure cuff with regular cuff. Blood pressure to be monitored regularly at home. ICD-10 code: O09.90. 05/17/19   Donnamae Jude, MD  cephALEXin (KEFLEX) 500 MG capsule Take 1 capsule (500 mg total) by mouth 2 (two) times daily. 11/26/20   Charlott Rakes, MD  gabapentin (NEURONTIN) 300 MG capsule TAKE 1 CAPSULE(300 MG) BY MOUTH THREE TIMES DAILY 01/29/21   Eulis Canner E, NP  hydrOXYzine (ATARAX/VISTARIL) 10 MG tablet TAKE 1 TABLET(10 MG) BY MOUTH THREE TIMES DAILY AS NEEDED 01/29/21   Salley Slaughter, NP  ketoconazole (NIZORAL) 2 % shampoo Apply 1 application topically daily as needed for itching.  12/13/19   [provider]  oxybutynin (DITROPAN-XL) 10 MG 24 hr tablet TAKE 1 TABLET(10 MG) BY MOUTH AT BEDTIME 01/08/21   Kerin Perna, NP  risperiDONE (RISPERDAL) 2 MG tablet 1 in am 2 at hs 04/05/20   Eulis Canner E, NP  traZODone (DESYREL) 50 MG tablet Take 1 tablet (50 mg total) by mouth at bedtime. 04/05/20   Salley Slaughter, NP    Allergies    Codeine  Review of Systems   Review of Systems  Constitutional:  Negative for appetite change, diaphoresis, fatigue, fever and unexpected weight change.  HENT:  Negative for mouth sores.   Eyes:  Negative for visual disturbance.  Respiratory:  Negative for cough, chest tightness, shortness of breath and wheezing.   Cardiovascular:  Negative for chest pain.  Gastrointestinal:  Negative for abdominal pain, constipation, diarrhea, nausea and vomiting.  Endocrine: Negative for polydipsia, polyphagia and polyuria.  Genitourinary:  Negative for dysuria, frequency, hematuria and urgency.  Musculoskeletal:  Negative for back pain and neck stiffness.  Skin:  Negative for rash.  Allergic/Immunologic: Negative for immunocompromised state.  Neurological:  Positive for headaches. Negative for syncope and light-headedness.  Hematological:  Does not bruise/bleed easily.  Psychiatric/Behavioral:  Negative for sleep disturbance. The patient is not nervous/anxious.    Physical Exam Updated Vital Signs BP (!) 151/103   Pulse 64   Temp 98 F (36.7 C) (Oral)   Resp 16   SpO2 99%   Physical Exam Vitals and nursing note reviewed.  Constitutional:      General: She is not  in acute distress.    Appearance: She is well-developed. She is not diaphoretic.  HENT:     Head: Normocephalic and atraumatic.     Nose: Nose normal.     Mouth/Throat:     Mouth: Mucous membranes are moist.  Eyes:     General: No scleral icterus.    Conjunctiva/sclera: Conjunctivae normal.     Pupils: Pupils are equal, round, and reactive to light.     Comments: No horizontal, vertical or rotational nystagmus  Neck:     Comments: Full active and passive ROM without pain No midline or paraspinal tenderness No nuchal rigidity or meningeal signs Cardiovascular:     Rate and Rhythm: Normal rate and regular rhythm.  Pulmonary:  Effort: Pulmonary effort is normal. No respiratory distress.     Breath sounds: No wheezing or rales.  Abdominal:     General: There is no distension.     Palpations: Abdomen is soft.     Tenderness: There is no abdominal tenderness. There is no guarding or rebound.  Musculoskeletal:        General: Normal range of motion.     Cervical back: Normal range of motion and neck supple.  Lymphadenopathy:     Cervical: No cervical adenopathy.  Skin:    General: Skin is warm and dry.     Findings: No rash.  Neurological:     Mental Status: She is alert and oriented to person, place, and time.     Cranial Nerves: No cranial nerve deficit.     Motor: No abnormal muscle tone.     Coordination: Coordination normal.     Comments: Mental Status:  Alert, oriented, thought content appropriate. Speech fluent without evidence of aphasia. Able to follow 2 step commands without difficulty.  Cranial Nerves:  II:  Peripheral visual fields grossly normal, pupils equal, round, reactive to light III,IV, VI: ptosis not present, extra-ocular motions intact bilaterally  V,VII: smile symmetric, facial light touch sensation equal VIII: hearing grossly normal bilaterally  IX,X: midline uvula rise  XI: bilateral shoulder shrug equal and strong XII: midline tongue extension   Motor:  5/5 in upper and lower extremities bilaterally including strong and equal grip strength and dorsiflexion/plantar flexion Sensory: Pinprick and light touch normal in all extremities.  Cerebellar: normal finger-to-nose with bilateral upper extremities Gait: normal gait and balance CV: distal pulses palpable throughout   Psychiatric:        Behavior: Behavior normal.        Thought Content: Thought content normal.        Judgment: Judgment normal.    ED Results / Procedures / Treatments     Radiology CT HEAD WO CONTRAST (5MM)  Result Date: 02/08/2021 CLINICAL DATA:  Headache. EXAM: CT HEAD WITHOUT CONTRAST TECHNIQUE: Contiguous axial images were obtained from the base of the skull through the vertex without intravenous contrast. COMPARISON:  Head CT dated 01/22/2020. FINDINGS: Brain: Mild chronic microvascular ischemic changes, advanced for the patient's age. There is no acute intracranial hemorrhage. No mass effect or midline shift. No extra-axial fluid collection. Vascular: No hyperdense vessel or unexpected calcification. Skull: Normal. Negative for fracture or focal lesion. Sinuses/Orbits: No acute finding. Other: None IMPRESSION: No acute intracranial pathology. Electronically Signed   By: Anner Crete M.D.   On: 02/08/2021 01:01    Procedures Procedures   Medications Ordered in ED Medications  prochlorperazine (COMPAZINE) injection 10 mg (10 mg Intramuscular Given 02/08/21 0336)  dexamethasone (DECADRON) injection 10 mg (10 mg Intramuscular Given 02/08/21 0335)  ketorolac (TORADOL) injection 60 mg (60 mg Intramuscular Given 02/08/21 0334)  diphenhydrAMINE (BENADRYL) capsule 50 mg (50 mg Oral Given 02/08/21 [id number])    ED Course  I have reviewed the triage vital signs and the nursing notes.  Pertinent labs & imaging results that were available during my care of the patient were reviewed by me and considered in my medical decision making (see chart for details).    MDM  Rules/Calculators/A&P                           Patient with history of migraines presents to the emergency department with headache ongoing x4 days.  Normal neurologic exam.  CT scan without acute abnormalities.  Highly doubt subarachnoid hemorrhage.  No mass noted on CT scan.  Will give migraine cocktail and reassess.  4:33 AM Patient reports she is feeling significantly better.  Discussed her elevated blood pressure.  She will need close follow-up with her primary care provider.  She does have a history of essential hypertension.  No chest pain or shortness of breath today.  CT scan without evidence of acute intracranial hemorrhage.  Additionally recommend patient follow-up with [facility] neurology for ongoing migraine headaches.  She states understanding and is in agreement with the plan.  BP (!) 147/110 (BP Location: Right Arm)   Pulse 62   Temp 98 F (36.7 C) (Oral)   Resp 17   SpO2 99%    Final Clinical Impression(s) / ED Diagnoses Final diagnoses:  Bad headache  Hypertension, unspecified type    Rx / DC Orders ED Discharge Orders     None        Matthias Bogus, Gwenlyn Perking [id number]    Delora Fuel, MD [id number] [phone]

## 2021-02-11 ENCOUNTER — Other Ambulatory Visit (INDEPENDENT_AMBULATORY_CARE_PROVIDER_SITE_OTHER): Payer: Self-pay | Admitting: Primary Care

## 2021-02-11 DIAGNOSIS — F314 Bipolar disorder, current episode depressed, severe, without psychotic features: Secondary | ICD-10-CM

## 2021-02-11 DIAGNOSIS — R32 Unspecified urinary incontinence: Secondary | ICD-10-CM

## 2021-02-11 NOTE — Telephone Encounter (Signed)
Medication Refill - Medication:oxybutynin (DITROPAN-XL) 10 MG 24 hr tablet ,gabapentin (NEURONTIN) 300  hydrOXYzine (ATARAX/VISTARIL) 10 MG tablet, cephALEXin (KEFLEX) 500 MG capsule  Has the patient contacted their pharmacy? yes (Agent: If no, request that the patient contact the pharmacy for the refill.) (Agent: If yes, when and what did the pharmacy advise?)contact pcp  Preferred Pharmacy (with phone number or street name):  cephALEXin (KEFLEX) 500 MG capsule  Agent: Please be advised that RX refills may take up to 3 business days. We ask that you follow-up with your pharmacy.

## 2021-02-11 NOTE — Telephone Encounter (Signed)
Requested medication (s) are due for refill today: no  Requested medication (s) are on the active medication list: yes   Future visit scheduled: yes   Notes to clinic:  Medications requested filled by different providers  Review for refills    Requested Prescriptions  Pending Prescriptions Disp Refills   hydrOXYzine (ATARAX/VISTARIL) 10 MG tablet 90 tablet 2    Sig: TAKE 1 TABLET(10 MG) BY MOUTH THREE TIMES DAILY AS NEEDED      Ear, Nose, and Throat:  Antihistamines Passed - 02/11/2021  1:39 PM      Passed - Valid encounter within last 12 months    Recent Outpatient Visits           2 months ago Dysuria   [place], Michelle P, NP   8 months ago Urinary incontinence, unspecified type   [place] Kerin Perna, NP   1 year ago Bipolar disorder, current episode mixed, moderate ([place])   [place], [place], NP   1 year ago Bipolar disorder, current episode mixed, moderate ([place])   [place] Scot Jun, FNP   3 years ago Bipolar affective disorder, remission status unspecified ([place])   [place], Roger David, PA-C       Future Appointments             In 1 month Edwards, Milford Cage, NP [facility]               cephALEXin (KEFLEX) 500 MG capsule 14 capsule 0    Sig: Take 1 capsule (500 mg total) by mouth 2 (two) times daily.      There is no refill protocol information for this order

## 2021-03-18 ENCOUNTER — Ambulatory Visit: Payer: 59 | Admitting: Neurology

## 2021-03-19 ENCOUNTER — Inpatient Hospital Stay (INDEPENDENT_AMBULATORY_CARE_PROVIDER_SITE_OTHER): Payer: 59 | Admitting: Primary Care

## 2021-03-20 ENCOUNTER — Encounter: Payer: Self-pay | Admitting: Neurology

## 2021-05-07 ENCOUNTER — Ambulatory Visit (INDEPENDENT_AMBULATORY_CARE_PROVIDER_SITE_OTHER): Payer: 59 | Admitting: Primary Care

## 2021-05-21 ENCOUNTER — Ambulatory Visit (INDEPENDENT_AMBULATORY_CARE_PROVIDER_SITE_OTHER): Payer: 59 | Admitting: Primary Care

## 2021-05-25 ENCOUNTER — Other Ambulatory Visit (INDEPENDENT_AMBULATORY_CARE_PROVIDER_SITE_OTHER): Payer: Self-pay | Admitting: Primary Care

## 2021-05-25 ENCOUNTER — Inpatient Hospital Stay (HOSPITAL_COMMUNITY)
Admission: AD | Admit: 2021-05-25 | Discharge: 2021-05-26 | Disposition: A | Discharge status: Home / Self Care | Payer: 59 | Attending: Emergency Medicine | Admitting: Emergency Medicine

## 2021-05-25 ENCOUNTER — Other Ambulatory Visit: Payer: Self-pay

## 2021-05-25 ENCOUNTER — Encounter (HOSPITAL_COMMUNITY): Payer: Self-pay | Admitting: Emergency Medicine

## 2021-05-25 ENCOUNTER — Other Ambulatory Visit: Payer: Self-pay | Admitting: Family Medicine

## 2021-05-25 DIAGNOSIS — Z79899 Other long term (current) drug therapy: Secondary | ICD-10-CM | POA: Diagnosis not present

## 2021-05-25 DIAGNOSIS — E210 Primary hyperparathyroidism: Secondary | ICD-10-CM

## 2021-05-25 DIAGNOSIS — Z3A10 10 weeks gestation of pregnancy: Secondary | ICD-10-CM | POA: Diagnosis not present

## 2021-05-25 DIAGNOSIS — O26891 Other specified pregnancy related conditions, first trimester: Secondary | ICD-10-CM | POA: Diagnosis not present

## 2021-05-25 DIAGNOSIS — R109 Unspecified abdominal pain: Secondary | ICD-10-CM

## 2021-05-25 DIAGNOSIS — O99611 Diseases of the digestive system complicating pregnancy, first trimester: Secondary | ICD-10-CM | POA: Insufficient documentation

## 2021-05-25 DIAGNOSIS — K8020 Calculus of gallbladder without cholecystitis without obstruction: Secondary | ICD-10-CM | POA: Diagnosis not present

## 2021-05-25 DIAGNOSIS — Z3491 Encounter for supervision of normal pregnancy, unspecified, first trimester: Secondary | ICD-10-CM

## 2021-05-25 DIAGNOSIS — O2341 Unspecified infection of urinary tract in pregnancy, first trimester: Secondary | ICD-10-CM

## 2021-05-25 DIAGNOSIS — O99281 Endocrine, nutritional and metabolic diseases complicating pregnancy, first trimester: Secondary | ICD-10-CM | POA: Diagnosis not present

## 2021-05-25 DIAGNOSIS — O26899 Other specified pregnancy related conditions, unspecified trimester: Secondary | ICD-10-CM

## 2021-05-25 DIAGNOSIS — O219 Vomiting of pregnancy, unspecified: Secondary | ICD-10-CM | POA: Diagnosis not present

## 2021-05-25 DIAGNOSIS — Z87891 Personal history of nicotine dependence: Secondary | ICD-10-CM | POA: Diagnosis not present

## 2021-05-25 LAB — COMPREHENSIVE METABOLIC PANEL
ALT: 42 U/L (ref 0–44)
AST: 30 U/L (ref 15–41)
Albumin: 3.9 g/dL (ref 3.5–5.0)
Alkaline Phosphatase: 71 U/L (ref 38–126)
Anion gap: 6 (ref 5–15)
BUN: 13 mg/dL (ref 6–20)
CO2: 21 mmol/L — ABNORMAL LOW (ref 22–32)
Calcium: 13.2 mg/dL (ref 8.9–10.3)
Chloride: 103 mmol/L (ref 98–111)
Creatinine, Ser: 1.05 mg/dL — ABNORMAL HIGH (ref 0.44–1.00)
GFR, Estimated: >60 mL/min (ref >60)
Glucose, Bld: 113 mg/dL — ABNORMAL HIGH (ref 70–99)
Potassium: 3.4 mmol/L — ABNORMAL LOW (ref 3.5–5.1)
Sodium: 130 mmol/L — ABNORMAL LOW (ref 135–145)
Total Bilirubin: 1.1 mg/dL (ref 0.3–1.2)
Total Protein: 7.5 g/dL (ref 6.5–8.1)

## 2021-05-25 LAB — CBC WITH DIFFERENTIAL/PLATELET
Abs Immature Granulocytes: 0.05 10*3/uL (ref 0.00–0.07)
Basophils Absolute: 0 10*3/uL (ref 0.0–0.1)
Basophils Relative: 0 %
Eosinophils Absolute: 0 10*3/uL (ref 0.0–0.5)
Eosinophils Relative: 0 %
HCT: 38 % (ref 36.0–46.0)
Hemoglobin: 13 g/dL (ref 12.0–15.0)
Immature Granulocytes: 1 %
Lymphocytes Relative: 18 %
Lymphs Abs: 1.9 10*3/uL (ref 0.7–4.0)
MCH: 29.7 pg (ref 26.0–34.0)
MCHC: 34.2 g/dL (ref 30.0–36.0)
MCV: 87 fL (ref 80.0–100.0)
Monocytes Absolute: 1.6 10*3/uL — ABNORMAL HIGH (ref 0.1–1.0)
Monocytes Relative: 15 %
Neutro Abs: 6.9 10*3/uL (ref 1.7–7.7)
Neutrophils Relative %: 66 %
Platelets: 250 10*3/uL (ref 150–400)
RBC: 4.37 MIL/uL (ref 3.87–5.11)
RDW: 11.6 % (ref 11.5–15.5)
WBC: 10.4 10*3/uL (ref 4.0–10.5)
nRBC: 0 % (ref 0.0–0.2)

## 2021-05-25 LAB — URINALYSIS, ROUTINE W REFLEX MICROSCOPIC
Bilirubin Urine: NEGATIVE
Glucose, UA: NEGATIVE mg/dL
Ketones, ur: NEGATIVE mg/dL
Nitrite: POSITIVE — AB
Protein, ur: 100 mg/dL — AB
RBC / HPF: >50 RBC/hpf — ABNORMAL HIGH (ref 0–5)
Specific Gravity, Urine: 1.023 (ref 1.005–1.030)
WBC, UA: >50 WBC/hpf — ABNORMAL HIGH (ref 0–5)
pH: 6 (ref 5.0–8.0)

## 2021-05-25 LAB — LIPASE, BLOOD: Lipase: 34 U/L (ref 11–51)

## 2021-05-25 LAB — I-STAT BETA HCG BLOOD, ED (MC, WL, AP ONLY): I-stat hCG, quantitative: >2000 m[IU]/mL — ABNORMAL HIGH (ref <5)

## 2021-05-25 NOTE — ED Triage Notes (Signed)
Patient arrived with EMS from home reports persistent emesis with pain across her abdomen for 3 weeks .

## 2021-05-25 NOTE — ED Notes (Signed)
X1 for vitals for triage with no response

## 2021-05-25 NOTE — ED Provider Notes (Signed)
Emergency Medicine Provider Triage Evaluation Note  Theresa Mccoy , a 40 y.o. female  was evaluated in triage.  Pt complains of abdominal pain nausea, and vomiting.  Patient reports that pain is located throughout her entire abdomen.  Pain has been present over the last 3 weeks.  Pain has gotten progressively worse over this time.  Reports pain radiates to left flank and back patient endorses nausea and vomiting.  States that emesis is bilious and stomach contents.  Review of Systems  Positive: Abdominal pain, nausea, vomiting, hematuria Negative: Dysuria, urinary urgency, melena, blood in stool, diarrhea, constipation, hematemesis, coffee-ground emesis, fevers, chills  Physical Exam  BP 108/84 (BP Location: Left Arm)   Pulse 91   Temp 98.4 F (36.9 C) (Oral)   Resp 18   SpO2 100%  Gen:   Awake, no distress   Resp:  Normal effort  MSK:   Moves extremities without difficulty  Other:  Abdomen soft, nondistended, diffuse tenderness throughout entire abdomen.  CVA tenderness bilaterally  Medical Decision Making  Medically screening exam initiated at 9:19 PM.  Appropriate orders placed.  Theresa Mccoy was informed that the remainder of the evaluation will be completed by another provider, this initial triage assessment does not replace that evaluation, and the importance of remaining in the ED until their evaluation is complete.     Loni Beckwith, PA-C 05/25/21 2121    Pattricia Boss, MD 05/26/21 Theresa Mccoy

## 2021-05-26 ENCOUNTER — Inpatient Hospital Stay (HOSPITAL_COMMUNITY): Payer: 59

## 2021-05-26 ENCOUNTER — Encounter (HOSPITAL_COMMUNITY): Payer: Self-pay

## 2021-05-26 DIAGNOSIS — O26891 Other specified pregnancy related conditions, first trimester: Secondary | ICD-10-CM

## 2021-05-26 DIAGNOSIS — Z3A10 10 weeks gestation of pregnancy: Secondary | ICD-10-CM | POA: Diagnosis not present

## 2021-05-26 DIAGNOSIS — O219 Vomiting of pregnancy, unspecified: Secondary | ICD-10-CM | POA: Diagnosis not present

## 2021-05-26 LAB — WET PREP, GENITAL
Sperm: NONE SEEN
Trich, Wet Prep: NONE SEEN
WBC, Wet Prep HPF POC: <10 (ref <10)
Yeast Wet Prep HPF POC: NONE SEEN

## 2021-05-26 LAB — HCG, QUANTITATIVE, PREGNANCY: hCG, Beta Chain, Quant, S: 239028 m[IU]/mL — ABNORMAL HIGH (ref <5)

## 2021-05-26 LAB — COMPREHENSIVE METABOLIC PANEL
ALT: 27 U/L (ref 0–44)
AST: 13 U/L — ABNORMAL LOW (ref 15–41)
Albumin: 2.9 g/dL — ABNORMAL LOW (ref 3.5–5.0)
Alkaline Phosphatase: 51 U/L (ref 38–126)
Anion gap: 5 (ref 5–15)
BUN: 13 mg/dL (ref 6–20)
CO2: 19 mmol/L — ABNORMAL LOW (ref 22–32)
Calcium: 11.7 mg/dL — ABNORMAL HIGH (ref 8.9–10.3)
Chloride: 111 mmol/L (ref 98–111)
Creatinine, Ser: 0.94 mg/dL (ref 0.44–1.00)
GFR, Estimated: >60 mL/min (ref >60)
Glucose, Bld: 95 mg/dL (ref 70–99)
Potassium: 3.8 mmol/L (ref 3.5–5.1)
Sodium: 135 mmol/L (ref 135–145)
Total Bilirubin: 1 mg/dL (ref 0.3–1.2)
Total Protein: 5.8 g/dL — ABNORMAL LOW (ref 6.5–8.1)

## 2021-05-26 LAB — CBC
HCT: 28.8 % — ABNORMAL LOW (ref 36.0–46.0)
Hemoglobin: 10 g/dL — ABNORMAL LOW (ref 12.0–15.0)
MCH: 30.4 pg (ref 26.0–34.0)
MCHC: 34.7 g/dL (ref 30.0–36.0)
MCV: 87.5 fL (ref 80.0–100.0)
Platelets: 182 10*3/uL (ref 150–400)
RBC: 3.29 MIL/uL — ABNORMAL LOW (ref 3.87–5.11)
RDW: 11.6 % (ref 11.5–15.5)
WBC: 7.9 10*3/uL (ref 4.0–10.5)
nRBC: 0 % (ref 0.0–0.2)

## 2021-05-26 LAB — T4, FREE: Free T4: 1.88 ng/dL — ABNORMAL HIGH (ref 0.61–1.12)

## 2021-05-26 LAB — TSH: TSH: <0.01 u[IU]/mL — ABNORMAL LOW (ref 0.350–4.500)

## 2021-05-26 MED ORDER — ONDANSETRON HCL 4 MG/2ML IJ SOLN: 4.0000 mg | Freq: Once | INTRAMUSCULAR | Status: DC

## 2021-05-26 MED ORDER — CYCLOBENZAPRINE HCL 5 MG PO TABS
10.0000 mg | ORAL_TABLET | Freq: Once | ORAL | Status: AC
  Administered 2021-05-26: 10 mg via ORAL
  Filled 2021-05-26: qty 2

## 2021-05-26 MED ORDER — SODIUM CHLORIDE 0.9 % IV BOLUS: 1000.0000 mL | Freq: Once | INTRAVENOUS | Status: DC

## 2021-05-26 MED ORDER — ONDANSETRON 4 MG PO TBDP
8.0000 mg | ORAL_TABLET | Freq: Once | ORAL | Status: AC
  Administered 2021-05-26: 8 mg via ORAL
  Filled 2021-05-26: qty 2

## 2021-05-26 MED ORDER — FAMOTIDINE 20 MG PO TABS: 20.0000 mg | ORAL_TABLET | Freq: Every day | ORAL | 0 refills | Status: DC

## 2021-05-26 MED ORDER — SODIUM CHLORIDE 0.9 % IV SOLN: INTRAVENOUS | Status: DC

## 2021-05-26 MED ORDER — PROMETHAZINE HCL 25 MG PO TABS: 25.0000 mg | ORAL_TABLET | Freq: Four times a day (QID) | ORAL | 0 refills | Status: DC | PRN

## 2021-05-26 MED ORDER — ACETAMINOPHEN 325 MG PO TABS
650.0000 mg | ORAL_TABLET | Freq: Once | ORAL | Status: AC
  Administered 2021-05-26: 650 mg via ORAL
  Filled 2021-05-26: qty 2

## 2021-05-26 MED ORDER — SODIUM CHLORIDE 0.9 % IV BOLUS
1000.0000 mL | Freq: Once | INTRAVENOUS | Status: AC
  Administered 2021-05-26: 1000 mL via INTRAVENOUS

## 2021-05-26 MED ORDER — CEFADROXIL 500 MG PO CAPS: 500.0000 mg | ORAL_CAPSULE | Freq: Two times a day (BID) | ORAL | 0 refills | Status: DC

## 2021-05-26 NOTE — Progress Notes (Addendum)
PO challenge given to patient. Oral fluids and crackers provided. 1400  Patient tolerated po challenge. Patient discharge instructions given.

## 2021-05-26 NOTE — MAU Note (Signed)
..  Theresa Mccoy is a 40 y.o. at Unknown here in MAU reporting: Abdominal pain and n/v for 3 weeks. Reports she has not been able to keep anything down and vomits about 2 times every day.  Denies vaginal bleeding.  LMP: some time in October  Pain score: 10/10 Vitals:   05/25/21 2346 05/26/21 0300  BP: (!) 114/94 (!) 121/94  Pulse: 90 93  Resp: 14 18  Temp: 98.1 F (36.7 C)   SpO2: 99% 100%

## 2021-05-26 NOTE — Discharge Instructions (Signed)

## 2021-05-26 NOTE — MAU Note (Signed)
No nausea or vomiting since patient arrival.

## 2021-05-26 NOTE — ED Notes (Signed)
MAU RN notified on patient's transfer.

## 2021-05-26 NOTE — MAU Note (Signed)
Pt originally collected vaginal swabs her self in bathroom after instruction. Wet prep needed to be recollected. Pt agreeable for RN to collect swabs. When adult diaper removed it was noted to be heavily saturated with urine and small amount of stool. Pt states she has had urinary incontinence for years and also is unable to distinguish passing of stool. New pad and panties provided. Gavin Pound CNM notified of pt condition and statements.

## 2021-05-26 NOTE — MAU Provider Note (Addendum)
History     CSN: [id number]  Arrival date and time: 05/25/21 2011   Event Date/Time   First Provider Initiated Contact with Patient 05/26/21 0427      Chief Complaint  Patient presents with   Abdominal Pain   Theresa Mccoy is a 40 y.o. [id number] at Unknown [place].  She states her last period was in October She presents today, as transfer from [facility], for Abdominal Pain.  She states the pain started about 3 weeks ago and has been ongoing.  She describes the pain as constant pressure and is located in the lower abdomen.  She rates the pain a 10/10 and reports it has no relieving factors, but notes sitting makes it worse.     OB History     Gravida  9   Para  2   Term  2   Preterm      AB  6   Living  2      SAB  6   IAB      Ectopic  0   Multiple      Live Births  2           Past Medical History:  Diagnosis Date   Anemia    Depression    Hyperthyroidism    PTSD (post-traumatic stress disorder)    SBO (small bowel obstruction) (HCC) 07/07/2019   Thyroid disease     Past Surgical History:  Procedure Laterality Date   BOWEL RESECTION N/A 07/08/2019   Procedure: Small Bowel Resection;  Surgeon: Connor, Chelsea A, MD;  Location: [facility];  Service: General;  Laterality: N/A;   CESAREAN SECTION     DILATION AND CURETTAGE OF UTERUS     HAND SURGERY Left    LAPAROTOMY N/A 07/08/2019   Procedure: EXPLORATORY LAPAROTOMY;  Surgeon: Connor, Chelsea A, MD;  Location: [facility];  Service: General;  Laterality: N/A;   SALPINGECTOMY      No family history on file.  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      Blood Pressure Monitoring (BLOOD PRESSURE MONITOR AUTOMAT) DEVI 1 Device by Does not apply route daily. Automatic blood pressure cuff with regular cuff. Blood pressure to be monitored regularly at home. ICD-10 code: O09.90. 1 kit 0    cephALEXin (KEFLEX) 500 MG capsule Take 1 capsule (500 mg total) by mouth 2 (two) times daily. 14 capsule 0    gabapentin (NEURONTIN) 300 MG capsule TAKE 1 CAPSULE(300 MG) BY MOUTH THREE TIMES DAILY 90 capsule 2    hydrOXYzine (ATARAX/VISTARIL) 10 MG tablet TAKE 1 TABLET(10 MG) BY MOUTH THREE TIMES DAILY AS NEEDED 90 tablet 2    ketoconazole (NIZORAL) 2 % shampoo Apply 1 application topically daily as needed for itching.       oxybutynin (DITROPAN-XL) 10   MG 24 hr tablet TAKE 1 TABLET(10 MG) BY MOUTH AT BEDTIME 30 tablet 1    risperiDONE (RISPERDAL) 2 MG tablet 1 in am 2 at hs 90 tablet 2    traZODone (DESYREL) 50 MG tablet Take 1 tablet (50 mg total) by mouth at bedtime. 30 tablet 2     Review of Systems  Gastrointestinal:  Positive for abdominal pain. Negative for constipation, diarrhea, nausea and vomiting.  Genitourinary:  Negative for difficulty urinating, dysuria, vaginal bleeding and vaginal discharge.  Musculoskeletal:  Positive for back pain.  Neurological:  Negative for dizziness, light-headedness and headaches.  Physical Exam   Blood pressure 112/87, pulse 88, temperature 98.1 F (36.7 C), temperature source Oral, resp. rate 17, height 5' (1.524 m), weight 53 kg, SpO2 100 %.  Physical Exam Vitals reviewed.  Constitutional:      Comments: Disheveled appearance Malodor Present   HENT:     Head: Normocephalic and atraumatic.  Eyes:     Conjunctiva/sclera: Conjunctivae normal.  Cardiovascular:     Rate and Rhythm: Normal rate.     Heart  sounds: Normal heart sounds.  Pulmonary:     Effort: Pulmonary effort is normal. No respiratory distress.     Breath sounds: Normal breath sounds.  Abdominal:     General: Bowel sounds are normal.     Palpations: Abdomen is soft.     Tenderness: There is abdominal tenderness in the suprapubic area, left upper quadrant and left lower quadrant.  Skin:    General: Skin is warm and dry.  Neurological:     Mental Status: She is alert and oriented to person, place, and time.  Psychiatric:        Mood and Affect: Mood normal.    MAU Course  Procedures Results for orders placed or performed during the hospital encounter of 05/25/21 (from the past 24 hour(s))  Urinalysis, Routine w reflex microscopic Urine, Clean Catch     Status: Abnormal   Collection Time: 05/25/21  9:19 PM  Result Value Ref Range   Color, Urine AMBER (A) YELLOW   APPearance TURBID (A) CLEAR   Specific Gravity, Urine 1.023 1.005 - 1.030   pH 6.0 5.0 - 8.0   Glucose, UA NEGATIVE NEGATIVE mg/dL   Hgb urine dipstick MODERATE (A) NEGATIVE   Bilirubin Urine NEGATIVE NEGATIVE   Ketones, ur NEGATIVE NEGATIVE mg/dL   Protein, ur 100 (A) NEGATIVE mg/dL   Nitrite POSITIVE (A) NEGATIVE   Leukocytes,Ua LARGE (A) NEGATIVE   RBC / HPF >50 (H) 0 - 5 RBC/hpf   WBC, UA >50 (H) 0 - 5 WBC/hpf   Bacteria, UA FEW (A) NONE SEEN   Squamous Epithelial / LPF 6-10 0 - 5   WBC Clumps PRESENT    Mucus PRESENT   Comprehensive metabolic panel     Status: Abnormal   Collection Time: 05/25/21  9:35 PM  Result Value Ref Range   Sodium 130 (L) 135 - 145 mmol/L   Potassium 3.4 (L) 3.5 - 5.1 mmol/L   Chloride 103 98 - 111 mmol/L   CO2 21 (L) 22 - 32 mmol/L   Glucose, Bld 113 (H) 70 - 99 mg/dL   BUN 13 6 - 20 mg/dL   Creatinine, Ser 1.05 (H) 0.44 - 1.00 mg/dL   Calcium 13.2 (HH) 8.9 - 10.3 mg/dL   Total Protein 7.5 6.5 - 8.1 g/dL   Albumin 3.9 3.5 - 5.0 g/dL   AST 30 15 - 41 U/L   ALT 42   0 - 44 U/L   Alkaline Phosphatase 71 38 - 126 U/L    Total Bilirubin 1.1 0.3 - 1.2 mg/dL   GFR, Estimated >60 >60 mL/min   Anion gap 6 5 - 15  CBC with Differential     Status: Abnormal   Collection Time: 05/25/21  9:35 PM  Result Value Ref Range   WBC 10.4 4.0 - 10.5 K/uL   RBC 4.37 3.87 - 5.11 MIL/uL   Hemoglobin 13.0 12.0 - 15.0 g/dL   HCT 38.0 36.0 - 46.0 %   MCV 87.0 80.0 - 100.0 fL   MCH 29.7 26.0 - 34.0 pg   MCHC 34.2 30.0 - 36.0 g/dL   RDW 11.6 11.5 - 15.5 %   Platelets 250 150 - 400 K/uL   nRBC 0.0 0.0 - 0.2 %   Neutrophils Relative % 66 %   Neutro Abs 6.9 1.7 - 7.7 K/uL   Lymphocytes Relative 18 %   Lymphs Abs 1.9 0.7 - 4.0 K/uL   Monocytes Relative 15 %   Monocytes Absolute 1.6 (H) 0.1 - 1.0 K/uL   Eosinophils Relative 0 %   Eosinophils Absolute 0.0 0.0 - 0.5 K/uL   Basophils Relative 0 %   Basophils Absolute 0.0 0.0 - 0.1 K/uL   Immature Granulocytes 1 %   Abs Immature Granulocytes 0.05 0.00 - 0.07 K/uL  Lipase, blood     Status: None   Collection Time: 05/25/21  9:35 PM  Result Value Ref Range   Lipase 34 11 - 51 U/L  I-Stat Beta hCG blood, ED ([facility], [facility], [facility] only)     Status: Abnormal   Collection Time: 05/25/21 10:17 PM  Result Value Ref Range   I-stat hCG, quantitative >2,000.0 (H) <5 mIU/mL   Comment 3          TSH     Status: Abnormal   Collection Time: 05/26/21  3:04 AM  Result Value Ref Range   TSH <0.010 (L) 0.350 - 4.500 uIU/mL    MDM Physical Exam Cultures: Wet Prep and GC/CT Labs: UA, CBC, hCG, T3&4 Ultrasound Assessment and Plan  40 year old [id number] at Unknown [place] Abdominal Pain UTI Hypercalcemia   -Labs ordered by [facility] provider. -Results reviewed. -Provider informs patient of urine c/w UTI.  Will send for culture. -Plan to treat. -Will send for US. -Patient offered and accepts pain medication. -Flexeril and tylenol given. -Nurse instructed to collect cultures   Jessica L Emly 05/26/2021, 4:27 AM   Reassessment (5:32 AM) Dr. Pratt reviews chart and advises: *Give IV  fluids *Obtain additional labs: Free T3&T4, Plan to repeat CMP for CA+ levels *Add on Renal US to assess for kidney stones -Nurse updated on POC. -Provider to bedside to update patient.  Questions addressed.   Reassessment (8:10 AM)  -Dr. Pratt updated on patient status and intervention. Advised: *Repeat CMP upon completion of 2nd bag of NS -Nurse updated on POC -Report given to and care assumed by E. , NP  Jessica L Emly MSN, CNM Advanced Practice Provider, [facility]    Labs repeated after patient given 2 liters of IV fluids, significant improvement noted. She is resting comfortably in her room. Has not been vomiting & denies pain.  Labs & chart reviewed by Dr. Ozan. Ok for discharge home. Pt should have f/u with her PCP & will start OB care.   At time of discharge - pt's spouse is on the phone insisting that patient needs antiemetic & food   prior to discharge because "she's always readmitted for this for the last 2 years". Patient has not vomited while in MAU and earlier had declined zofran that was previously ordered.  Per patient & spouse request, zofran was given & patient had food. She is stable for discharge.     1. Normal IUP (intrauterine pregnancy) on prenatal ultrasound, first trimester   2. Abdominal pain during pregnancy   3. Abdominal pain   4. [redacted] weeks gestation of pregnancy   5. Hypercalcemia   6. Primary hyperparathyroidism (HCC)   7. Calculus of gallbladder without cholecystitis without obstruction   8. Urinary tract infection in mother during first trimester of pregnancy    -discharge in stable condition -rx phenergan & pepcid -msg to [facility] for [facility] appointment -pt to f/u with her PCP -reviewed reasons to return to MAU  , , NP  

## 2021-05-26 NOTE — Progress Notes (Signed)
Patient ambulated to bathroom given item to wash up. No complaints at this time

## 2021-05-26 NOTE — ED Provider Notes (Signed)
I reassessed the patient prior to transfer to MAU.  She seems appropriate for transfer.   Montine Circle, PA-C 05/26/21 2500    Merrily Pew, MD 05/26/21 [phone]

## 2021-05-26 NOTE — ED Provider Notes (Signed)
Identified patient via review of emergency room waiting room. Pregnant. Here with vomiting and significant hypercalcemia as likely cause. Will start fluids. Will add on initial workup for hypercalcemia. Discussed with MAU who felt comfortable managing patient if still stable. Plan to recheck vitals, reeval and rediscuss with MAU provider for possible transfer. I did not personally interact with or evaluate patient.    Allah Reason, Corene Cornea, MD 05/26/21 [phone]

## 2021-05-27 ENCOUNTER — Other Ambulatory Visit: Payer: Self-pay | Admitting: Family Medicine

## 2021-05-27 LAB — CULTURE, OB URINE

## 2021-05-27 LAB — GC/CHLAMYDIA PROBE AMP (~~LOC~~) NOT AT ARMC
Chlamydia: NEGATIVE
Comment: NEGATIVE
Comment: NORMAL
Neisseria Gonorrhea: NEGATIVE

## 2021-05-27 LAB — PTH, INTACT AND CALCIUM
Calcium, Total (PTH): 13.5 mg/dL (ref 8.7–10.2)
PTH: 137 pg/mL — ABNORMAL HIGH (ref 15–65)

## 2021-05-27 LAB — CALCITRIOL (1,25 DI-OH VIT D): Vit D, 1,25-Dihydroxy: 90 pg/mL — ABNORMAL HIGH (ref 24.8–81.5)

## 2021-05-27 LAB — T3, FREE: T3, Free: 4.4 pg/mL (ref 2.0–4.4)

## 2021-05-27 MED ORDER — PROMETHAZINE HCL 25 MG PO TABS: 25.0000 mg | ORAL_TABLET | Freq: Four times a day (QID) | ORAL | 0 refills | Status: DC | PRN

## 2021-05-27 MED ORDER — FAMOTIDINE 20 MG PO TABS: 20.0000 mg | ORAL_TABLET | Freq: Every day | ORAL | 0 refills | Status: DC

## 2021-05-28 ENCOUNTER — Telehealth: Payer: Self-pay | Admitting: Family Medicine

## 2021-05-28 NOTE — Telephone Encounter (Signed)
Patient was called regarding scheduling OB appointments, there was no answer to the phone call so a voicemail was left with a message and the office call back number.

## 2021-06-06 LAB — PTH-RELATED PEPTIDE: PTH-related peptide: <2 pmol/L

## 2021-06-18 ENCOUNTER — Other Ambulatory Visit: Payer: Self-pay

## 2021-06-18 ENCOUNTER — Encounter (INDEPENDENT_AMBULATORY_CARE_PROVIDER_SITE_OTHER): Payer: Self-pay

## 2021-06-18 ENCOUNTER — Inpatient Hospital Stay (INDEPENDENT_AMBULATORY_CARE_PROVIDER_SITE_OTHER): Payer: 59 | Admitting: Primary Care

## 2021-06-21 ENCOUNTER — Other Ambulatory Visit (HOSPITAL_COMMUNITY)
Admission: RE | Admit: 2021-06-21 | Discharge: 2021-06-21 | Disposition: A | Discharge status: Home / Self Care | Payer: 59 | Source: Ambulatory Visit | Attending: Family Medicine | Admitting: Family Medicine

## 2021-06-21 ENCOUNTER — Encounter: Payer: Self-pay | Admitting: Obstetrics and Gynecology

## 2021-06-21 ENCOUNTER — Other Ambulatory Visit: Payer: Self-pay

## 2021-06-21 ENCOUNTER — Ambulatory Visit (INDEPENDENT_AMBULATORY_CARE_PROVIDER_SITE_OTHER): Payer: 59 | Admitting: Obstetrics and Gynecology

## 2021-06-21 VITALS — BP 107/76 | Wt 118.3 lb

## 2021-06-21 DIAGNOSIS — Z9079 Acquired absence of other genital organ(s): Secondary | ICD-10-CM

## 2021-06-21 DIAGNOSIS — Z9049 Acquired absence of other specified parts of digestive tract: Secondary | ICD-10-CM

## 2021-06-21 DIAGNOSIS — O0990 Supervision of high risk pregnancy, unspecified, unspecified trimester: Secondary | ICD-10-CM

## 2021-06-21 DIAGNOSIS — Z3A13 13 weeks gestation of pregnancy: Secondary | ICD-10-CM | POA: Insufficient documentation

## 2021-06-21 DIAGNOSIS — O09521 Supervision of elderly multigravida, first trimester: Secondary | ICD-10-CM | POA: Diagnosis not present

## 2021-06-21 DIAGNOSIS — Z5941 Food insecurity: Secondary | ICD-10-CM

## 2021-06-21 DIAGNOSIS — O34219 Maternal care for unspecified type scar from previous cesarean delivery: Secondary | ICD-10-CM

## 2021-06-21 DIAGNOSIS — E210 Primary hyperparathyroidism: Secondary | ICD-10-CM

## 2021-06-21 MED ORDER — PRENATAL 27-1 MG PO TABS: 1.0000 | ORAL_TABLET | Freq: Every day | ORAL | 12 refills | Status: AC

## 2021-06-21 MED ORDER — VITAFOL ULTRA 29-0.6-0.4-200 MG PO CAPS: 1.0000 | ORAL_CAPSULE | Freq: Every day | ORAL | 12 refills | Status: DC

## 2021-06-21 MED ORDER — DOXYLAMINE-PYRIDOXINE 10-10 MG PO TBEC: 2.0000 | DELAYED_RELEASE_TABLET | Freq: Every day | ORAL | 5 refills | Status: DC

## 2021-06-21 NOTE — Progress Notes (Signed)
Subjective:    Theresa Mccoy is a [id number] [redacted]w[redacted]d being seen today for her first obstetrical visit.  Her obstetrical history is significant for advanced maternal age. Patient with 2 previous cesarean section (first for failure to progress and the other was elective). Patient also had a small bowel resection due to small bowel obstructions. She was diagnosed with primary hyperparathyroid hormone but did not follow up with endocrinology. Patient also has housing and transportation concerns. She is currently living in a hotel with her husband and walked to her appointment. Patient does intend to breast feed. Pregnancy history fully reviewed.  Patient reports no complaints.  Vitals:   06/21/21 1020  BP: 107/76  Weight: 118 lb 4.8 oz (53.7 kg)    HISTORY: OB History  Gravida Para Term Preterm AB Living  12 2 2   9 2   SAB IAB Ectopic Multiple Live Births  8   1   2     # Outcome Date GA Lbr Len/2nd Weight Sex Delivery Anes PTL Lv  12 Current           11 Ectopic 2005          10 Term 10/09/00 [redacted]w[redacted]d   M CS-LTranv EPI N LIV  9 Term 11/30/96 [redacted]w[redacted]d   F CS-LTranv Gen  LIV     Complications: Failure to Progress in First Stage  8 SAB           7 SAB           6 SAB           5 SAB           4 SAB           3 SAB           2 SAB           1 SAB            Past Medical History:  Diagnosis Date   Anemia    Depression    Hyperthyroidism    PTSD (post-traumatic stress disorder)    SBO (small bowel obstruction) ([place]) 07/07/2019   Past Surgical History:  Procedure Laterality Date   BOWEL RESECTION N/A 07/08/2019   Procedure: Small Bowel Resection;  Surgeon: Clovis Riley, MD;  Location: [facility];  Service: General;  Laterality: N/A;   Maish Vaya     HAND SURGERY Left    LAPAROTOMY N/A 07/08/2019   Procedure: EXPLORATORY LAPAROTOMY;  Surgeon: Clovis Riley, MD;  Location: [facility];  Service: General;  Laterality: N/A;    OOPHORECTOMY Right    SALPINGECTOMY     History reviewed. No pertinent family history.   Exam    Uterus:   14-weeks  Pelvic Exam:    Perineum: No Hemorrhoids, Normal Perineum   Vulva: normal   Vagina:  normal mucosa, normal discharge   pH:    Cervix: nulliparous appearance and cervix is closed and long   Adnexa: no mass, fullness, tenderness   Bony Pelvis: gynecoid  System: Breast:  normal appearance, no masses or tenderness   Skin: normal coloration and turgor, no rashes    Neurologic: oriented, no focal deficits   Extremities: normal strength, tone, and muscle mass   HEENT extra ocular movement intact   Mouth/Teeth mucous membranes moist, pharynx normal without lesions and dental hygiene poor   Neck supple and no masses   Cardiovascular: regular rate and rhythm  Respiratory:  appears well, vitals normal, no respiratory distress, acyanotic, normal RR, chest clear, no wheezing, crepitations, rhonchi, normal symmetric air entry   Abdomen: soft, non-tender; bowel sounds normal; no masses,  no organomegaly   Urinary:       Assessment:    Pregnancy: [id number] Patient Active Problem List   Diagnosis Date Noted   Supervision of high risk pregnancy, antepartum 06/21/2021   Previous cesarean delivery affecting pregnancy, antepartum 06/21/2021   S/P small bowel resection 06/21/2021   H/O unilateral salpingectomy 06/21/2021   Severe bipolar I disorder, current or most recent episode depressed ([place]) 04/05/2020   Sepsis ([place]) 01/22/2020   Hypotension 01/22/2020   Hypernatremia 01/22/2020   AKI (acute kidney injury) ([place]) 01/22/2020   Adnexal mass 01/22/2020   Renal calculus, right 01/22/2020   Hypercalcemia 07/07/2019   Primary hyperparathyroidism ([place]) 07/07/2019   Urinary tract infection 07/02/2019   SBO (small bowel obstruction) (HCC) 07/02/2019   Muscle cramps 02/02/2017   Bipolar disorder, current episode mixed, moderate ([place]) 02/02/2017    Generalized abdominal pain 02/02/2017   Generalized anxiety disorder 02/02/2017   Essential hypertension 02/02/2017        Plan:     Initial labs drawn. Prenatal vitamins. Problem list reviewed and updated. Genetic Screening discussed : Panorama ordered.  Ultrasound discussed; fetal survey: ordered. Patient referred to endocrinology Patient also referred to see Roselyn Reef given multiple social issues Patient is aware that she will require a third c-section Rx ASA provided  Follow up in 4 weeks. 50% of 30 min visit spent on counseling and coordination of care.     Abenezer Odonell 06/21/2021

## 2021-06-21 NOTE — Patient Instructions (Signed)
First Trimester of Pregnancy °The first trimester of pregnancy starts on the first day of your last menstrual period until the end of week 12. This is months 1 through 3 of pregnancy. A week after a sperm fertilizes an egg, the egg will implant into the wall of the uterus and begin to develop into a baby. By the end of 12 weeks, all the baby's organs will be formed and the baby will be 2-3 inches in size. °Body changes during your first trimester °Your body goes through many changes during pregnancy. The changes vary and generally return to normal after your baby is born. °Physical changes °You may gain or lose weight. °Your breasts may begin to grow larger and become tender. The tissue that surrounds your nipples (areola) may become darker. °Dark spots or blotches (chloasma or mask of pregnancy) may develop on your face. °You may have changes in your hair. These can include thickening or thinning of your hair or changes in texture. °Health changes °You may feel nauseous, and you may vomit. °You may have heartburn. °You may develop headaches. °You may develop constipation. °Your gums may bleed and may be sensitive to brushing and flossing. °Other changes °You may tire easily. °You may urinate more often. °Your menstrual periods will stop. °You may have a loss of appetite. °You may develop cravings for certain kinds of food. °You may have changes in your emotions from day to day. °You may have more vivid and strange dreams. °Follow these instructions at home: °Medicines °Follow your health care provider's instructions regarding medicine use. Specific medicines may be either safe or unsafe to take during pregnancy. Do not take any medicines unless told to by your health care provider. °Take a prenatal vitamin that contains at least 600 micrograms (mcg) of folic acid. °Eating and drinking °Eat a healthy diet that includes fresh fruits and vegetables, whole grains, good sources of protein such as meat, eggs, or tofu,  and low-fat dairy products. °Avoid raw meat and unpasteurized juice, milk, and cheese. These carry germs that can harm you and your baby. °If you feel nauseous or you vomit: °Eat 4 or 5 small meals a day instead of 3 large meals. °Try eating a few soda crackers. °Drink liquids between meals instead of during meals. °You may need to take these actions to prevent or treat constipation: °Drink enough fluid to keep your urine pale yellow. °Eat foods that are high in fiber, such as beans, whole grains, and fresh fruits and vegetables. °Limit foods that are high in fat and processed sugars, such as fried or sweet foods. °Activity °Exercise only as directed by your health care provider. Most people can continue their usual exercise routine during pregnancy. Try to exercise for 30 minutes at least 5 days a week. °Stop exercising if you develop pain or cramping in the lower abdomen or lower back. °Avoid exercising if it is very hot or humid or if you are at high altitude. °Avoid heavy lifting. °If you choose to, you may have sex unless your health care provider tells you not to. °Relieving pain and discomfort °Wear a good support bra to relieve breast tenderness. °Rest with your legs elevated if you have leg cramps or low back pain. °If you develop bulging veins (varicose veins) in your legs: °Wear support hose as told by your health care provider. °Elevate your feet for 15 minutes, 3-4 times a day. °Limit salt in your diet. °Safety °Wear your seat belt at all times when driving   or riding in a car. Talk with your health care provider if someone is verbally or physically abusive to you. Talk with your health care provider if you are feeling sad or have thoughts of hurting yourself. Lifestyle Do not use hot tubs, steam rooms, or saunas. Do not douche. Do not use tampons or scented sanitary pads. Do not use herbal remedies, alcohol, illegal drugs, or medicines that are not approved by your health care provider. Chemicals  in these products can harm your baby. Do not use any products that contain nicotine or tobacco, such as cigarettes, e-cigarettes, and chewing tobacco. If you need help quitting, ask your health care provider. Avoid cat litter boxes and soil used by cats. These carry germs that can cause birth defects in the baby and possibly loss of the unborn baby (fetus) by miscarriage or stillbirth. General instructions During routine prenatal visits in the first trimester, your health care provider will do a physical exam, perform necessary tests, and ask you how things are going. Keep all follow-up visits. This is important. Ask for help if you have counseling or nutritional needs during pregnancy. Your health care provider can offer advice or refer you to specialists for help with various needs. Schedule a dentist appointment. At home, brush your teeth with a soft toothbrush. Floss gently. Write down your questions. Take them to your prenatal visits. Where to find more information American Pregnancy Association: americanpregnancy.[place] and Gynecologists: PoolDevices.com.pt Office on [employer] Health: KeywordPortfolios.com.br Contact a health care provider if you have: Dizziness. A fever. Mild pelvic cramps, pelvic pressure, or nagging pain in the abdominal area. Nausea, vomiting, or diarrhea that lasts for 24 hours or longer. A bad-smelling vaginal discharge. Pain when you urinate. Known exposure to a contagious illness, such as chickenpox, measles, Zika virus, HIV, or hepatitis. Get help right away if you have: Spotting or bleeding from your vagina. Severe abdominal cramping or pain. Shortness of breath or chest pain. Any kind of trauma, such as from a fall or a car crash. New or increased pain, swelling, or redness in an arm or leg. Summary The first trimester of pregnancy starts on the first day of your last menstrual period until the end of week  12 (months 1 through 3). Eating 4 or 5 small meals a day rather than 3 large meals may help to relieve nausea and vomiting. Do not use any products that contain nicotine or tobacco, such as cigarettes, e-cigarettes, and chewing tobacco. If you need help quitting, ask your health care provider. Keep all follow-up visits. This is important. This information is not intended to replace advice given to you by your health care provider. Make sure you discuss any questions you have with your health care provider. Document Revised: 11/30/2019 Document Reviewed: 10/06/2019 Elsevier Patient Education  2022 [place] of Pregnancy The second trimester of pregnancy is from week 13 through week 27. This is months 4 through 6 of pregnancy. The second trimester is often a time when you feel your best. Your body has adjusted to being pregnant, and you begin to feel better physically. During the second trimester: Morning sickness has lessened or stopped completely. You may have more energy. You may have an increase in appetite. The second trimester is also a time when the unborn baby (fetus) is growing rapidly. At the end of the sixth month, the fetus may be up to 12 inches long and weigh about 1 pounds. You will likely begin to  feel the baby move (quickening) between 16 and 20 weeks of pregnancy. Body changes during your second trimester Your body continues to go through many changes during your second trimester. The changes vary and generally return to normal after the baby is born. Physical changes Your weight will continue to increase. You will notice your lower abdomen bulging out. You may begin to get stretch marks on your hips, abdomen, and breasts. Your breasts will continue to grow and to become tender. Dark spots or blotches (chloasma or mask of pregnancy) may develop on your face. A dark line from your belly button to the pubic area (linea nigra) may appear. You may have  changes in your hair. These can include thickening of your hair, rapid growth, and changes in texture. Some people also have hair loss during or after pregnancy, or hair that feels dry or thin. Health changes You may develop headaches. You may have heartburn. You may develop constipation. You may develop hemorrhoids or swollen, bulging veins (varicose veins). Your gums may bleed and may be sensitive to brushing and flossing. You may urinate more often because the fetus is pressing on your bladder. You may have back pain. This is caused by: Weight gain. Pregnancy hormones that are relaxing the joints in your pelvis. A shift in weight and the muscles that support your balance. Follow these instructions at home: Medicines Follow your health care provider's instructions regarding medicine use. Specific medicines may be either safe or unsafe to take during pregnancy. Do not take any medicines unless approved by your health care provider. Take a prenatal vitamin that contains at least 600 micrograms (mcg) of folic acid. Eating and drinking Eat a healthy diet that includes fresh fruits and vegetables, whole grains, good sources of protein such as meat, eggs, or tofu, and low-fat dairy products. Avoid raw meat and unpasteurized juice, milk, and cheese. These carry germs that can harm you and your baby. You may need to take these actions to prevent or treat constipation: Drink enough fluid to keep your urine pale yellow. Eat foods that are high in fiber, such as beans, whole grains, and fresh fruits and vegetables. Limit foods that are high in fat and processed sugars, such as fried or sweet foods. Activity Exercise only as directed by your health care provider. Most people can continue their usual exercise routine during pregnancy. Try to exercise for 30 minutes at least 5 days a week. Stop exercising if you develop contractions in your uterus. Stop exercising if you develop pain or cramping in the  lower abdomen or lower back. Avoid exercising if it is very hot or humid or if you are at a high altitude. Avoid heavy lifting. If you choose to, you may have sex unless your health care provider tells you not to. Relieving pain and discomfort Wear a supportive bra to prevent discomfort from breast tenderness. Take warm sitz baths to soothe any pain or discomfort caused by hemorrhoids. Use hemorrhoid cream if your health care provider approves. Rest with your legs raised (elevated) if you have leg cramps or low back pain. If you develop varicose veins: Wear support hose as told by your health care provider. Elevate your feet for 15 minutes, 3-4 times a day. Limit salt in your diet. Safety Wear your seat belt at all times when driving or riding in a car. Talk with your health care provider if someone is verbally or physically abusive to you. Lifestyle Do not use hot tubs, steam rooms, or saunas.  Do not douche. Do not use tampons or scented sanitary pads. Avoid cat litter boxes and soil used by cats. These carry germs that can cause birth defects in the baby and possibly loss of the fetus by miscarriage or stillbirth. Do not use herbal remedies, alcohol, illegal drugs, or medicines that are not approved by your health care provider. Chemicals in these products can harm your baby. Do not use any products that contain nicotine or tobacco, such as cigarettes, e-cigarettes, and chewing tobacco. If you need help quitting, ask your health care provider. General instructions During a routine prenatal visit, your health care provider will do a physical exam and other tests. He or she will also discuss your overall health. Keep all follow-up visits. This is important. Ask your health care provider for a referral to a local prenatal education class. Ask for help if you have counseling or nutritional needs during pregnancy. Your health care provider can offer advice or refer you to specialists for help  with various needs. Where to find more information American Pregnancy Association: americanpregnancy.[place] and Gynecologists: PoolDevices.com.pt Office on [employer] Health: KeywordPortfolios.com.br Contact a health care provider if you have: A headache that does not go away when you take medicine. Vision changes or you see spots in front of your eyes. Mild pelvic cramps, pelvic pressure, or nagging pain in the abdominal area. Persistent nausea, vomiting, or diarrhea. A bad-smelling vaginal discharge or foul-smelling urine. Pain when you urinate. Sudden or extreme swelling of your face, hands, ankles, feet, or legs. A fever. Get help right away if you: Have fluid leaking from your vagina. Have spotting or bleeding from your vagina. Have severe abdominal cramping or pain. Have difficulty breathing. Have chest pain. Have fainting spells. Have not felt your baby move for the time period told by your health care provider. Have new or increased pain, swelling, or redness in an arm or leg. Summary The second trimester of pregnancy is from week 13 through week 27 (months 4 through 6). Do not use herbal remedies, alcohol, illegal drugs, or medicines that are not approved by your health care provider. Chemicals in these products can harm your baby. Exercise only as directed by your health care provider. Most people can continue their usual exercise routine during pregnancy. Keep all follow-up visits. This is important. This information is not intended to replace advice given to you by your health care provider. Make sure you discuss any questions you have with your health care provider. Document Revised: 11/30/2019 Document Reviewed: 10/06/2019 Elsevier Patient Education  2022 [employer].  Contraception Choices Contraception, also called birth control, refers to methods or devices that prevent pregnancy. Hormonal methods Contraceptive  implant A contraceptive implant is a thin, plastic tube that contains a hormone that prevents pregnancy. It is different from an intrauterine device (IUD). It is inserted into the upper part of the arm by a health care provider. Implants can be effective for up to 3 years. Progestin-only injections Progestin-only injections are injections of progestin, a synthetic form of the hormone progesterone. They are given every 3 months by a health care provider. Birth control pills Birth control pills are pills that contain hormones that prevent pregnancy. They must be taken once a day, preferably at the same time each day. A prescription is needed to use this method of contraception. Birth control patch The birth control patch contains hormones that prevent pregnancy. It is placed on the skin and must be changed once a week for  three weeks and removed on the fourth week. A prescription is needed to use this method of contraception. Vaginal ring A vaginal ring contains hormones that prevent pregnancy. It is placed in the vagina for three weeks and removed on the fourth week. After that, the process is repeated with a new ring. A prescription is needed to use this method of contraception. Emergency contraceptive Emergency contraceptives prevent pregnancy after unprotected sex. They come in pill form and can be taken up to 5 days after sex. They work best the sooner they are taken after having sex. Most emergency contraceptives are available without a prescription. This method should not be used as your only form of birth control. Barrier methods Female condom A female condom is a thin sheath that is worn over the penis during sex. Condoms keep sperm from going inside a woman's body. They can be used with a sperm-killing substance (spermicide) to increase their effectiveness. They should be thrown away after one use. Female condom A female condom is a soft, loose-fitting sheath that is put into the vagina before  sex. The condom keeps sperm from going inside a woman's body. They should be thrown away after one use. Diaphragm A diaphragm is a soft, dome-shaped barrier. It is inserted into the vagina before sex, along with a spermicide. The diaphragm blocks sperm from entering the uterus, and the spermicide kills sperm. A diaphragm should be left in the vagina for 6-8 hours after sex and removed within 24 hours. A diaphragm is prescribed and fitted by a health care provider. A diaphragm should be replaced every 1-2 years, after giving birth, after gaining more than 15 lb (6.8 kg), and after pelvic surgery. Cervical cap A cervical cap is a round, soft latex or plastic cup that fits over the cervix. It is inserted into the vagina before sex, along with spermicide. It blocks sperm from entering the uterus. The cap should be left in place for 6-8 hours after sex and removed within 48 hours. A cervical cap must be prescribed and fitted by a health care provider. It should be replaced every 2 years. Sponge A sponge is a soft, circular piece of polyurethane foam with spermicide in it. The sponge helps block sperm from entering the uterus, and the spermicide kills sperm. To use it, you make it wet and then insert it into the vagina. It should be inserted before sex, left in for at least 6 hours after sex, and removed and thrown away within 30 hours. Spermicides Spermicides are chemicals that kill or block sperm from entering the cervix and uterus. They can come as a cream, jelly, suppository, foam, or tablet. A spermicide should be inserted into the vagina with an applicator at least 51-88 minutes before sex to allow time for it to work. The process must be repeated every time you have sex. Spermicides do not require a prescription. Intrauterine contraception Intrauterine device (IUD) An IUD is a T-shaped device that is put in a woman's uterus. There are two types: Hormone IUD.This type contains progestin, a synthetic  form of the hormone progesterone. This type can stay in place for 3-5 years. Copper IUD.This type is wrapped in copper wire. It can stay in place for 10 years. Permanent methods of contraception Female tubal ligation In this method, a woman's fallopian tubes are sealed, tied, or blocked during surgery to prevent eggs from traveling to the uterus. Hysteroscopic sterilization In this method, a small, flexible insert is placed into each fallopian tube. The  inserts cause scar tissue to form in the fallopian tubes and block them, so sperm cannot reach an egg. The procedure takes about 3 months to be effective. Another form of birth control must be used during those 3 months. Female sterilization This is a procedure to tie off the tubes that carry sperm (vasectomy). After the procedure, the man can still ejaculate fluid (semen). Another form of birth control must be used for 3 months after the procedure. Natural planning methods Natural family planning In this method, a couple does not have sex on days when the woman could become pregnant. Calendar method In this method, the woman keeps track of the length of each menstrual cycle, identifies the days when pregnancy can happen, and does not have sex on those days. Ovulation method In this method, a couple avoids sex during ovulation. Symptothermal method This method involves not having sex during ovulation. The woman typically checks for ovulation by watching changes in her temperature and in the consistency of cervical mucus. Post-ovulation method In this method, a couple waits to have sex until after ovulation. Where to find more information Centers for Disease Control and Prevention: http://www.wolf.info/ Summary Contraception, also called birth control, refers to methods or devices that prevent pregnancy. Hormonal methods of contraception include implants, injections, pills, patches, vaginal rings, and emergency contraceptives. Barrier methods of  contraception can include female condoms, female condoms, diaphragms, cervical caps, sponges, and spermicides. There are two types of IUDs (intrauterine devices). An IUD can be put in a woman's uterus to prevent pregnancy for 3-5 years. Permanent sterilization can be done through a procedure for males and females. Natural family planning methods involve nothaving sex on days when the woman could become pregnant. This information is not intended to replace advice given to you by your health care provider. Make sure you discuss any questions you have with your health care provider. Document Revised: 11/28/2019 Document Reviewed: 11/28/2019 Elsevier Patient Education  [place].

## 2021-06-22 LAB — CBC/D/PLT+RPR+RH+ABO+RUBIGG...
Antibody Screen: NEGATIVE
Basophils Absolute: 0 10*3/uL (ref 0.0–0.2)
Basos: 0 %
EOS (ABSOLUTE): 0.2 10*3/uL (ref 0.0–0.4)
Eos: 1 %
HCV Ab: <0.1 s/co ratio (ref 0.0–0.9)
HIV Screen 4th Generation wRfx: NONREACTIVE
Hematocrit: 31.7 % — ABNORMAL LOW (ref 34.0–46.6)
Hemoglobin: 10.3 g/dL — ABNORMAL LOW (ref 11.1–15.9)
Hepatitis B Surface Ag: NEGATIVE
Immature Grans (Abs): 0 10*3/uL (ref 0.0–0.1)
Immature Granulocytes: 0 %
Lymphocytes Absolute: 2.8 10*3/uL (ref 0.7–3.1)
Lymphs: 22 %
MCH: 29.4 pg (ref 26.6–33.0)
MCHC: 32.5 g/dL (ref 31.5–35.7)
MCV: 91 fL (ref 79–97)
Monocytes Absolute: 0.9 10*3/uL (ref 0.1–0.9)
Monocytes: 7 %
Neutrophils Absolute: 8.8 10*3/uL — ABNORMAL HIGH (ref 1.4–7.0)
Neutrophils: 70 %
Platelets: 290 10*3/uL (ref 150–450)
RBC: 3.5 x10E6/uL — ABNORMAL LOW (ref 3.77–5.28)
RDW: 12.3 % (ref 11.7–15.4)
RPR Ser Ql: NONREACTIVE
Rh Factor: POSITIVE
Rubella Antibodies, IGG: 3.25 index (ref >0.99)
WBC: 12.8 10*3/uL — ABNORMAL HIGH (ref 3.4–10.8)

## 2021-06-22 LAB — HCV INTERPRETATION

## 2021-06-22 LAB — HEMOGLOBIN A1C
Est. average glucose Bld gHb Est-mCnc: 117 mg/dL
Hgb A1c MFr Bld: 5.7 % — ABNORMAL HIGH (ref 4.8–5.6)

## 2021-06-22 LAB — HEPATITIS C ANTIBODY: Hep C Virus Ab: <0.1 s/co ratio (ref 0.0–0.9)

## 2021-06-22 LAB — TSH: TSH: <0.005 u[IU]/mL — ABNORMAL LOW (ref 0.450–4.500)

## 2021-06-24 LAB — CERVICOVAGINAL ANCILLARY ONLY
Bacterial Vaginitis (gardnerella): POSITIVE — AB
Candida Glabrata: NEGATIVE
Candida Vaginitis: POSITIVE — AB
Comment: NEGATIVE
Comment: NEGATIVE
Comment: NEGATIVE

## 2021-06-25 ENCOUNTER — Other Ambulatory Visit: Payer: Self-pay

## 2021-06-25 ENCOUNTER — Telehealth: Payer: Self-pay

## 2021-06-25 DIAGNOSIS — R7303 Prediabetes: Secondary | ICD-10-CM

## 2021-06-25 DIAGNOSIS — O0990 Supervision of high risk pregnancy, unspecified, unspecified trimester: Secondary | ICD-10-CM

## 2021-06-25 DIAGNOSIS — R7989 Other specified abnormal findings of blood chemistry: Secondary | ICD-10-CM

## 2021-06-25 LAB — URINE CULTURE, OB REFLEX

## 2021-06-25 LAB — CULTURE, OB URINE

## 2021-06-25 MED ORDER — CEPHALEXIN 500 MG PO CAPS: 500.0000 mg | ORAL_CAPSULE | Freq: Four times a day (QID) | ORAL | 2 refills | Status: DC

## 2021-06-25 MED ORDER — TERCONAZOLE 0.8 % VA CREA: 1.0000 | TOPICAL_CREAM | Freq: Every day | VAGINAL | 0 refills | Status: DC

## 2021-06-25 MED ORDER — METRONIDAZOLE 500 MG PO TABS: 500.0000 mg | ORAL_TABLET | Freq: Two times a day (BID) | ORAL | 0 refills | Status: DC

## 2021-06-25 NOTE — Telephone Encounter (Addendum)
-----   Message from Mora Bellman, MD sent at 06/25/2021  4:50 PM EST ----- Please inform patient of UTI. Rx e-prescribed  MyChart message sent.

## 2021-06-25 NOTE — Addendum Note (Signed)
Addended by: Mora Bellman on: 06/25/2021 10:21 AM   Modules accepted: Orders

## 2021-06-25 NOTE — Addendum Note (Signed)
Addended by: Mora Bellman on: 06/25/2021 04:50 PM   Modules accepted: Orders

## 2021-06-25 NOTE — Telephone Encounter (Addendum)
-----   Message from Mora Bellman, MD sent at 06/24/2021  8:22 AM EST ----- Please inform patient that she is pre-diabetic and needs to come in for an early 2 hour glucola. In addition, her thyroid testing is abnormal and needs to come in for free t3, free t4. This can be collected at the time of her glucola  Constant, Peggy, MD  P [facility] Please inform patient of both yeast and BV infection. Rx have been e-prescribed   Called pt; patient answered the phone, I introduced myself, patient identified herself as nicole and stated "yall need to stop calling me, you're getting on my nerves." Call was disconnected prior to review of results. MyChart message sent.

## 2021-06-26 LAB — CYTOLOGY - PAP
Chlamydia: NEGATIVE
Comment: NEGATIVE
Comment: NEGATIVE
Comment: NEGATIVE
Comment: NORMAL
Diagnosis: NEGATIVE
High risk HPV: NEGATIVE
Neisseria Gonorrhea: NEGATIVE
Trichomonas: NEGATIVE

## 2021-06-29 ENCOUNTER — Encounter: Payer: Self-pay | Admitting: Obstetrics and Gynecology

## 2021-06-29 DIAGNOSIS — B9689 Other specified bacterial agents as the cause of diseases classified elsewhere: Secondary | ICD-10-CM

## 2021-07-02 ENCOUNTER — Other Ambulatory Visit: Payer: 59

## 2021-07-04 ENCOUNTER — Other Ambulatory Visit: Payer: 59

## 2021-07-05 ENCOUNTER — Other Ambulatory Visit: Payer: Self-pay | Admitting: Lactation Services

## 2021-07-05 ENCOUNTER — Other Ambulatory Visit: Payer: Self-pay

## 2021-07-05 MED ORDER — METRONIDAZOLE 0.75 % VA GEL
1.0000 | Freq: Two times a day (BID) | VAGINAL | 0 refills | Status: DC
  Filled 2021-07-05: qty 70, 4d supply, fill #0

## 2021-07-05 NOTE — Progress Notes (Signed)
Ordered Metrogel for patient at her request. She reports Flagyl makes her vomit and requested Metrogel.

## 2021-07-08 ENCOUNTER — Other Ambulatory Visit: Payer: Self-pay

## 2021-07-09 ENCOUNTER — Other Ambulatory Visit: Payer: Self-pay

## 2021-07-09 ENCOUNTER — Ambulatory Visit: Payer: Medicaid Other | Admitting: Clinical

## 2021-07-09 ENCOUNTER — Other Ambulatory Visit: Payer: 59

## 2021-07-09 DIAGNOSIS — R7303 Prediabetes: Secondary | ICD-10-CM

## 2021-07-09 DIAGNOSIS — F3160 Bipolar disorder, current episode mixed, unspecified: Secondary | ICD-10-CM

## 2021-07-09 DIAGNOSIS — O0990 Supervision of high risk pregnancy, unspecified, unspecified trimester: Secondary | ICD-10-CM

## 2021-07-09 DIAGNOSIS — R7989 Other specified abnormal findings of blood chemistry: Secondary | ICD-10-CM

## 2021-07-09 NOTE — Patient Instructions (Signed)
[facility] for [facility] [place], [place] [address] [phone] (main office) [phone] ([place] office)  [place]:   What if I or someone I know is in crisis?  If you are thinking about harming yourself or having thoughts of suicide, or if you know someone who is, seek help right away.  Call your doctor or mental health care provider.  Call 911 or go to a hospital emergency room to get immediate help, or ask a friend or family member to help you do these things; IF YOU ARE IN [place], YOU MAY GO TO WALK-IN URGENT CARE 24/7 at [facility] (see below)  Call the [place] National Suicide Prevention Lifelines toll-free, 24-hour hotline at 1-800-273-TALK 937-337-2967) or TTY: 1-800-799-4 TTY 301-235-6396) to talk to a trained counselor.  If you are in crisis, make sure you are not left alone.   If someone else is in crisis, make sure he or she is not left alone   24 Hour :   [facility]  [address] [name], [place] [address] [phone] or [phone] WALK-IN URGENT CARE 24/7  [facility] Mobile Crisis: [phone]) [phone]  [place] National Suicide Hotline: 2256465838   [facility], [address] [place], [place] Fax: [phone] [email or web address] *Interpreters available *Accepts all insurance and uninsured for Urgent Care needs *Accepts Medicaid and uninsured for outpatient treatment

## 2021-07-09 NOTE — BH Assessment (Addendum)
error 

## 2021-07-09 NOTE — BH Specialist Note (Signed)
Integrated Behavioral Health via Telemedicine Visit  07/09/2021 Theresa Mccoy [id number]  Number of [place] visits: 1 Session Start time: 8:22  Session End time: 8:37 Total time: 15  Referring Provider: Mora Bellman, MD Patient/Family location: Home [facility] Provider location: Center for [place] at [facility]  All persons participating in visit: [name] and Theresa Mccoy   Types of Service: Individual psychotherapy and Video visit  I connected with Theresa Mccoy and/or Theresa Mccoy's n/a via  Telephone or Video Enabled Telemedicine Application  (Video is Caregility application) and verified that I am speaking with the correct person using two identifiers. Discussed confidentiality: Yes   I discussed the limitations of telemedicine and the availability of in person appointments.  Discussed there is a possibility of technology failure and discussed alternative modes of communication if that failure occurs.  I discussed that engaging in this telemedicine visit, they consent to the provision of behavioral healthcare and the services will be billed under their insurance.  Patient and/or legal guardian expressed understanding and consented to Telemedicine visit: Yes   Presenting Concerns: Patient and/or family reports the following symptoms/concerns: Pt requests referral to return to [facility] for [facility] medication management; pt has not taken medication in "a long time"; previously on Risperdal, Neurontin, Desyrel and Atarax.  Duration of problem: Ongoing; Severity of problem: severe  Goals Addressed: Patient will:  Demonstrate ability to: Increase healthy adjustment to current life circumstances and Improve medication compliance  Progress towards Goals: Ongoing  Interventions: Interventions utilized:  Medication Monitoring and Supportive Reflection Standardized Assessments completed: PHQ9/GAD7 given in past  two weeks  Patient and/or Family Response: Pt agrees with treatment plan  Assessment: Patient currently experiencing Bipolar affective disorder, currently unmedicated.   Patient may benefit from referral to psychiatry for [facility] medication management and brief therapeutic interventions.  Plan: Follow up with behavioral health clinician on : Two weeks Behavioral recommendations:  -Accept [facility] -Consider using [facility] walk-in 24/7 as needed Referral(s): Integrated Orthoptist (In Clinic) and [place] (LME/Outside Clinic)  I discussed the assessment and treatment plan with the patient and/or parent/guardian. They were provided an opportunity to ask questions and all were answered. They agreed with the plan and demonstrated an understanding of the instructions.   They were advised to call back or seek an in-person evaluation if the symptoms worsen or if the condition fails to improve as anticipated.  Theresa Hamman Lumina Gitto, LCSW

## 2021-07-10 ENCOUNTER — Telehealth: Payer: Self-pay | Admitting: General Practice

## 2021-07-10 ENCOUNTER — Other Ambulatory Visit: Payer: Self-pay | Admitting: Obstetrics and Gynecology

## 2021-07-10 DIAGNOSIS — O24419 Gestational diabetes mellitus in pregnancy, unspecified control: Secondary | ICD-10-CM | POA: Insufficient documentation

## 2021-07-10 LAB — GLUCOSE TOLERANCE, 2 HOURS W/ 1HR
Glucose, 1 hour: 150 mg/dL (ref 70–179)
Glucose, 2 hour: 152 mg/dL (ref 70–152)
Glucose, Fasting: 97 mg/dL — ABNORMAL HIGH (ref 70–91)

## 2021-07-10 LAB — T4, FREE: Free T4: 0.8 ng/dL — ABNORMAL LOW (ref 0.82–1.77)

## 2021-07-10 LAB — T3, FREE: T3, Free: 2 pg/mL (ref 2.0–4.4)

## 2021-07-10 MED ORDER — METRONIDAZOLE 0.75 % VA GEL: 1.0000 | Freq: Every day | VAGINAL | 0 refills | Status: AC

## 2021-07-10 NOTE — Telephone Encounter (Signed)
-----   Message from Mora Bellman, MD sent at 07/10/2021  7:18 AM EST ----- Please inform patient of diagnosis of diabetes. Referral to diabetic educator placed

## 2021-07-10 NOTE — BH Specialist Note (Signed)
Integrated Behavioral Health via Telemedicine Visit  07/10/2021 Theresa Mccoy [id number]  Number of [place] visits: 2 Session Start time: 10:45  Session End time: 11:19 Total time:  58  Referring Provider: Clayton Lefort, MD Patient/Family location: Home [facility] Provider location: Center for [facility] at [facility]  All persons participating in visit: [name] and [name]   Types of Service: Individual psychotherapy and Video visit  I connected with Theresa Mccoy and/or Theresa Mccoy's  n/a  via  Telephone or Video Enabled Telemedicine Application  (Video is Caregility application) and verified that I am speaking with the correct person using two identifiers. Discussed confidentiality: Yes   I discussed the limitations of telemedicine and the availability of in person appointments.  Discussed there is a possibility of technology failure and discussed alternative modes of communication if that failure occurs.  I discussed that engaging in this telemedicine visit, they consent to the provision of behavioral healthcare and the services will be billed under their insurance.  Patient and/or legal guardian expressed understanding and consented to Telemedicine visit: Yes   Presenting Concerns: Patient and/or family reports the following symptoms/concerns: Confusion about being unable to understand lab results (wants to know if she's having twins); pt wants to know how to find out about obtaining housing (currently living in hotel; moving to friend's home temporarily today); problems with ebt card ("instacart took all the ebt but got no food"). Pt also lost her beloved uncle and a cousin in the past week. Pt is coping by keeping a positive outlook and "don't worry about what I can't change".  Duration of problem: Ongoing; Severity of problem: moderate  Patient and/or Family's Strengths/Protective Factors: Social  connections and Sense of purpose  Goals Addressed: Patient will:  Reduce symptoms of: stress   Increase knowledge and/or ability of: stress reduction   Demonstrate ability to: Increase healthy adjustment to current life circumstances, Increase adequate support systems for patient/family, and Begin healthy grieving over loss  Progress towards Goals: Ongoing  Interventions: Interventions utilized:  Solution-Focused Strategies and Link to [employer] Assessments completed: Not Needed  Patient and/or Family Response: Pt agrees with treatment plan  Assessment: Patient currently experiencing Bipolar affective disorder (as previously diagnosed by psychiatry) and Psychosocial stress.   Patient may benefit from continued psychoeducation and brief therapeutic interventions regarding coping with managing mood instability and current life stress .  Plan: Follow up with behavioral health clinician on : Two weeks Behavioral recommendations:  -Expect a call-back from clinical staff regarding lab results -Continue taking prenatal vitamin daily -Continue plan to bring husband to upcoming diabetes education appointment and ultrasound appointment -Consider additional housing resources on After Visit Summary -Continue working with case worker regarding obtaining maternity clothes -Continue using Advertising account executive as needed -Consider contacting EBT worker regarding money going missing on card -Accept [place] referral -Continue to consider [facility] 24/7 walk-in, as needed [facility]) Referral(s): [place] (In Clinic), [place] (LME/Outside Clinic), and Community Resources:  Food and Housing  I discussed the assessment and treatment plan with the patient and/or parent/guardian. They were provided an opportunity to ask questions and all were answered. They agreed with the plan and demonstrated an understanding of the instructions.    They were advised to call back or seek an in-person evaluation if the symptoms worsen or if the condition fails to improve as anticipated.  [name], LCSW

## 2021-07-10 NOTE — Telephone Encounter (Signed)
Called patient, no answer- left message to call us back or check mychart account for more information. Scheduled appt 1/17 @ 1015 with diabetes education

## 2021-07-19 ENCOUNTER — Ambulatory Visit: Payer: Medicaid Other | Admitting: Clinical

## 2021-07-19 ENCOUNTER — Other Ambulatory Visit: Payer: Self-pay

## 2021-07-19 ENCOUNTER — Encounter: Payer: Self-pay | Admitting: Family Medicine

## 2021-07-19 ENCOUNTER — Ambulatory Visit (INDEPENDENT_AMBULATORY_CARE_PROVIDER_SITE_OTHER): Payer: Medicaid Other | Admitting: Family Medicine

## 2021-07-19 VITALS — BP 102/72 | HR 109 | Wt 118.3 lb

## 2021-07-19 DIAGNOSIS — K56609 Unspecified intestinal obstruction, unspecified as to partial versus complete obstruction: Secondary | ICD-10-CM

## 2021-07-19 DIAGNOSIS — I10 Essential (primary) hypertension: Secondary | ICD-10-CM | POA: Diagnosis not present

## 2021-07-19 DIAGNOSIS — E210 Primary hyperparathyroidism: Secondary | ICD-10-CM | POA: Diagnosis not present

## 2021-07-19 DIAGNOSIS — F314 Bipolar disorder, current episode depressed, severe, without psychotic features: Secondary | ICD-10-CM

## 2021-07-19 DIAGNOSIS — O24419 Gestational diabetes mellitus in pregnancy, unspecified control: Secondary | ICD-10-CM

## 2021-07-19 DIAGNOSIS — Z658 Other specified problems related to psychosocial circumstances: Secondary | ICD-10-CM

## 2021-07-19 DIAGNOSIS — R8271 Bacteriuria: Secondary | ICD-10-CM

## 2021-07-19 DIAGNOSIS — E039 Hypothyroidism, unspecified: Secondary | ICD-10-CM

## 2021-07-19 DIAGNOSIS — N200 Calculus of kidney: Secondary | ICD-10-CM

## 2021-07-19 DIAGNOSIS — O0990 Supervision of high risk pregnancy, unspecified, unspecified trimester: Secondary | ICD-10-CM | POA: Diagnosis not present

## 2021-07-19 DIAGNOSIS — O99891 Other specified diseases and conditions complicating pregnancy: Secondary | ICD-10-CM

## 2021-07-19 DIAGNOSIS — O34219 Maternal care for unspecified type scar from previous cesarean delivery: Secondary | ICD-10-CM

## 2021-07-19 LAB — GLUCOSE, CAPILLARY: Glucose-Capillary: 117 mg/dL — ABNORMAL HIGH (ref 70–99)

## 2021-07-19 MED ORDER — LEVOTHYROXINE SODIUM 75 MCG PO TABS: 75.0000 ug | ORAL_TABLET | Freq: Every day | ORAL | 5 refills | Status: DC

## 2021-07-19 MED ORDER — ASPIRIN EC 81 MG PO TBEC: 81.0000 mg | DELAYED_RELEASE_TABLET | Freq: Every day | ORAL | 11 refills | Status: DC

## 2021-07-19 NOTE — Progress Notes (Signed)
Subjective:  Theresa Mccoy is a 41 y.o. [id number] at [redacted]w[redacted]d being seen today for ongoing prenatal care.  She is currently monitored for the following issues for this high-risk pregnancy and has Muscle cramps; Bipolar disorder, current episode mixed, moderate ([place]); Generalized abdominal pain; Generalized anxiety disorder; Essential hypertension; Urinary tract infection; SBO (small bowel obstruction) ([place]); Hypercalcemia; Primary hyperparathyroidism ([place]); Sepsis ([place]); Hypotension; Hypernatremia; AKI (acute kidney injury) ([place]); Adnexal mass; Renal calculus, right; Severe bipolar I disorder, current or most recent episode depressed ([place]); Supervision of high risk pregnancy, antepartum; Previous cesarean delivery affecting pregnancy, antepartum; S/P small bowel resection; H/O unilateral salpingectomy; Gestational diabetes mellitus (GDM) affecting pregnancy, antepartum; Asymptomatic bacteriuria in pregnancy; and Hypothyroidism on their problem list.  Patient reports no complaints.  Contractions: Not present. Vag. Bleeding: None.  Movement: Present. Denies leaking of fluid.   The following portions of the patient's history were reviewed and updated as appropriate: allergies, current medications, past family history, past medical history, past social history, past surgical history and problem list. Problem list updated.  Objective:   Vitals:   07/19/21 1025  BP: 102/72  Pulse: (!) 109  Weight: 118 lb 4.8 oz (53.7 kg)    Fetal Status: Fetal Heart Rate (bpm): 134   Movement: Present     General:  Alert, oriented and cooperative. Patient is in no acute distress.  Skin: Skin is warm and dry. No rash noted.   Cardiovascular: Normal heart rate noted  Respiratory: Normal respiratory effort, no problems with respiration noted  Abdomen: Soft, gravid, appropriate for gestational age. Pain/Pressure: Absent     Pelvic: Vag. Bleeding: None     Cervical exam deferred        Extremities: Normal range of  motion.  Edema: None  Mental Status: Normal mood and affect. Normal behavior. Normal judgment and thought content.   Urinalysis:      Assessment and Plan:  Pregnancy: [id number] at [redacted]w[redacted]d  1. Supervision of high risk pregnancy, antepartum BP and FHR normal Refused further blood draw at last visit and not able to obtain genetic testing Would like it sent today, order placed  2. Essential hypertension Noted in chart BP's normal to date this pregnancy Baseline labs notable for Cr 0.9-1.0, Calcium 11.7 in setting of known hyperparathyroidism  3. SBO (small bowel obstruction) (HCC) Hx of SBO with resection, op note from 07/08/2019 reviewed Necrotic perforated small bowel with frank spillage of feculent material Note reports dense adhesions in the abdomen and pelvis Schedule RCS with two attendings, consider repeat vertical skin  4. Primary hyperparathyroidism ([place]) Referred to endocrinology last visit She reports has not been called Referral placed again  5. Gestational diabetes mellitus (GDM) affecting pregnancy, antepartum Diagnosed on mildly abnormal 2hr GTT due to abnormal screening A1c Has not yet received testing supplies, not yet scheduled with [facility] Will send rx for supplies and schedule ASAP with DM Educator  6. Renal calculus, right   7. Severe bipolar I disorder, current or most recent episode depressed [facility]) Not currently on any meds Reports she finished her course, and no refills were there Not connected with psychiatric care at present Referred to [facility] across the street  8. Previous cesarean delivery affecting pregnancy, antepartum X2, very remote, last done in 2002 See SBO problem above  9. Asymptomatic bacteriuria in pregnancy Rx sent to patient She reports she took full course TOC today  10. Hypothyroidism, unspecified type Abnormal TSH and free T4 Start weight based Synthroid, 55mcg daily  Preterm labor symptoms  and general obstetric precautions  including but not limited to vaginal bleeding, contractions, leaking of fluid and fetal movement were reviewed in detail with the patient. Please refer to After Visit Summary for other counseling recommendations.  Return in 2 weeks (on 08/02/2021) for [facility], ob visit, needs MD, review sugar log.   Clarnce Flock, MD

## 2021-07-19 NOTE — BH Specialist Note (Signed)
Behavioral health intern spoke with pt after medical visit to ease anxiety. The pt expressed grief from lost of family members and stresses of currently moving. The intern took the pt to the food market. Pt was seen for under 15 minutes.

## 2021-07-19 NOTE — Patient Instructions (Signed)

## 2021-07-20 LAB — TSH+FREE T4
Free T4: 0.78 ng/dL — ABNORMAL LOW (ref 0.82–1.77)
TSH: 0.6 u[IU]/mL (ref 0.450–4.500)

## 2021-07-21 LAB — AFP, SERUM, OPEN SPINA BIFIDA
AFP MoM: 2.54
AFP Value: 144 ng/mL
Gest. Age on Collection Date: 17.7 weeks
Maternal Age At EDD: 41.4 yr
OSBR Risk 1 IN: 533
Test Results:: NEGATIVE
Weight: 118 [lb_av]

## 2021-07-21 LAB — URINE CULTURE, OB REFLEX

## 2021-07-21 LAB — CULTURE, OB URINE

## 2021-07-23 ENCOUNTER — Encounter: Payer: Medicaid Other | Attending: Obstetrics and Gynecology | Admitting: Registered"

## 2021-07-23 ENCOUNTER — Other Ambulatory Visit: Payer: Self-pay

## 2021-07-23 ENCOUNTER — Ambulatory Visit (INDEPENDENT_AMBULATORY_CARE_PROVIDER_SITE_OTHER): Payer: Medicaid Other | Admitting: Registered"

## 2021-07-23 DIAGNOSIS — Z3A00 Weeks of gestation of pregnancy not specified: Secondary | ICD-10-CM | POA: Diagnosis not present

## 2021-07-23 DIAGNOSIS — O24419 Gestational diabetes mellitus in pregnancy, unspecified control: Secondary | ICD-10-CM | POA: Diagnosis present

## 2021-07-23 MED ORDER — GLUCOSE BLOOD VI STRP: ORAL_STRIP | 12 refills | Status: DC

## 2021-07-23 MED ORDER — ACCU-CHEK SOFTCLIX LANCETS MISC: 12 refills | Status: DC

## 2021-07-23 NOTE — Patient Instructions (Addendum)
You can use the microwave to cook some foods: Eggs: Get a microwave safe bowl or large mug. Use cooking spray to coat the bowl, crack the egg into the bowl, cook for 30 seconds. Then stir the eggs and and cook for 20 seconds more or until not runny. You can add some chopped onions and cheese. You can have microwave waffle with the egg. Consider getting the whole grain waffles. If you have syrup, use sugar-free syrup.  Sandwiches, heat up lunch meat. Continue putting lettuce on your sandwich.  2 tacos at one time should be fine.  Consider only drinking water.  Instead of juice eat the whole fruit.

## 2021-07-23 NOTE — Addendum Note (Signed)
Addended by: Georgia Lopes on: 07/23/2021 10:54 AM   Modules accepted: Orders

## 2021-07-24 ENCOUNTER — Ambulatory Visit: Payer: Medicaid Other | Admitting: Clinical

## 2021-07-24 DIAGNOSIS — F3160 Bipolar disorder, current episode mixed, unspecified: Secondary | ICD-10-CM

## 2021-07-24 DIAGNOSIS — Z658 Other specified problems related to psychosocial circumstances: Secondary | ICD-10-CM

## 2021-07-24 NOTE — Progress Notes (Signed)
Patient was seen for Gestational Diabetes self-management on 07/23/21  Start time 28 and End time 1120 Pt states she was late because the transportation service went to her old address.   Estimated due date: 12/22/21; [redacted]w[redacted]d  Clinical: Medications: aspirin 81 mg, synthroid, prenatal vitamin Medical History: HTN, hypotension, hypothyroidism, Renal calculus, GAD, severe bipolar, s/p small bowel resection Labs: OGTT FBS 97 mg/dL, A1c 5.7%   Dietary and Lifestyle History: Pt needs corrective lenses, states she lost her glasses and doesn't know where accepts medicaid to get new glasses. Pt states she can read 14 pt font.  Pt states she does not cook and is trying to get husband to prepare food, but states he doesn't know how either. Pt states they do not have a stove, uses microwave to heat things up. Pt states she has meat in the freezer but doesn't know how to cook it.   Pt reports that her EBT funds disappear.  Physical Activity: not assessed Stress: not assessed Sleep: not assessed  24 hr Recall:  First Meal: rice crispies with sugar Snack: Second meal: Snack: Third meal: waffle and juice Snack: Beverages: water, apple juice, orange juice  NUTRITION INTERVENTION  Nutrition education (E-1) on the following topics:   Initial Follow-up  []  []  Definition of Gestational Diabetes []  []  Why dietary management is important in controlling blood glucose []  []  Effects each nutrient has on blood glucose levels []  []  Simple carbohydrates vs complex carbohydrates []  []  Fluid intake [x]  []  Creating a balanced meal plan []  []  Carbohydrate counting  [x]  []  When to check blood glucose levels [x]  []  Proper blood glucose monitoring techniques []  []  Effect of stress and stress reduction techniques  []  []  Exercise effect on blood glucose levels, appropriate exercise during pregnancy []  []  Importance of limiting caffeine and abstaining from alcohol and smoking []  []  Medications used for  blood sugar control during pregnancy []  []  Hypoglycemia and rule of 15 []  []  Postpartum self care  Blood glucose monitor given: Accu-chek Guide Me Lot #[id number] Exp: 08/16/2022 CBG: 111 mg/dL   Patient instructed to monitor glucose levels: FBS: 60 - ? 95 mg/dL (some clinics use 90 for cutoff) 1 hour: ? 140 mg/dL 2 hour: ? 120 mg/dL  Patient received handouts: MyPlate  Patient will be seen for follow-up in 2 weeks or as needed.

## 2021-07-24 NOTE — Patient Instructions (Signed)
Center for [facility] Healthcare at [facility] [place], [place] [address] [phone] (main office) [phone] [facility] office)  [place] (serves [place], [place], [place], [place], [place], [place], [place], [place], [place], [place], [place], [place], and [place] counties) [address], [place], [place] [address] [phone] [email or web address]  **Rental assistance, Home Rehabilitation,Weatherization Assistance Program, Forensic psychologist, Housing Voucher Program  [employer] for Housing and [employer] Studies: Chief Financial Officer Resources to residents of [place], [place], and [place] Make sure you have your documents ready, including:  (Household income verification: 2 months pay stubs, unemployment/social security award letter, statement of no income for all household members over 79) Photo ID for all household members over 18 Utility Bill/Rent Ledger/Lease: must show past due amount for utilities/rent, or the rental agreement if rent is current 2. Start your application online or by paper (in [place] or [place]) at:     http://boyd-evans.org/  3. Once you have completed the online application, you will get an email confirmation message from the county. Expect to hear back by phone or email at least 6-10 weeks from submitting your application.  4. While you wait:  Call [phone] to check in on your application Let your landlord know that you've applied. Your landlord will be asked to submit documents (W-9) during this application process. Payments will be made directly to the landlord/property [place] or utility assistance for [employer], [place], and [place] at https://rb.gy/[email or web address] Questions? Call or email Renee at  [phone] or [email or web address]   Eviction Mediation Program: The HOPE Program Https://www.rebuild.http://mills-williams.net/ HOPE Progam serves low-income renters in [place] counties, defined as less than or equal to 80% of the area median income for the county where the renter lives. In the following 12 counties, you should apply to your local rent and utility assistance program INSTEAD OF the HOPE Program: [place], [place], [place], [place], [place], [place], [place], [place], [place], [place], [place]  If you live outside of [place], contact [place] call center at [phone] to talk to a Program Representative Monday-Friday, 8am-5pm Note that Native American tribes also received federal funding for rent and utility assistance programs. Recognized members of the following tribes will be served by programs managed by tribal governments, including: [place] Indians, [place], [place], [place] of [place] and [place], [place], [phone] [email or web address] .[email or web address], [phone] [email or web address] .[place] [address], [place], [place] [address] [phone] www.gha-[place].[facility] [address] [place], [place] [address] [phone] https://manning.[email or web address] **Programs include: Engineer, building services and Housing Counseling, Healthy Doctor, general practice, Homeless Prevention and [place] [address] [place], [place], [place] [address] [phone] www.https://www.farmer-stevens.info/ **housing applications/recertification; tax payment relief/exemption under specific qualifications  [facility] [address], [place], [place] [address] www.onlineg[place].com/~maryshouse **transitional housing for  women in recovery who have minor children or are pregnant  [place], [place], [place] [address] ArtistMovie.se  **emergency shelter and support services for families facing homelessness  [place] [address] [place], [place] [address] [phone] www.youthfocus.org **transitional housing to pregnant women; emergency housing for youth who have run away, are experiencing a family  crisis, are victims of abuse or neglect, or are homeless  [facility] [address] [place], [place] [address] [phone] ircgso.org **Drop-in center for people experiencing homelessness; overnight warming center when temperature is 25 degrees or below  [employer] [place], [place], [place] [address] [phone] https://reentrystaffingagency.org/ **help with affordable housing to people experiencing homelessness or unemployment due to incarceration  [facility] [address] [place], [place] [address] [phone] www.greensborourbanministry.org  **emergency and transitional housing, rent/mortgage assistance, utility assistance  [facility] [address], [place], [place] [address] [phone] www.salvationarmyofg[place].org **emergency and transitional housing  [employer] for [employer] [place], [place], [place] [address] [phone] Www.habitatgreensboro.[place], [place], [place] [address] [phone] https://chshousing.org **[place] and [facility]  [employer] [address] [place] [address], [place], [place] [address] [phone] arrivance.com **home buyer education courses, foreclosure prevention  [place], [place], [place] [address] [phone] WirelessNovelties.no **Environmental Exposure Assessment (investigation of homes where either children or pregnant women with a confirmed elevated blood lead level reside)  [facility] [place], [place], [place] [address] [phone] http://www.perez.com/ **Home Expense Assistance/Repairs Program; offers home accessibility updates, such as ramps or bars in the bathroom  [employer] [address] [place], [place] [address] [phone] https://www.self-help.org/locations/[place]-branch **Offers credit-building and banking services to people unable to use traditional banking

## 2021-07-29 ENCOUNTER — Other Ambulatory Visit: Payer: Self-pay

## 2021-07-29 NOTE — BH Specialist Note (Signed)
Integrated Behavioral Health via in-person Visit  07/29/2021 Theresa Mccoy [id number]  Number of [place] visits: 4 Session Start time: 9:50  Session End time: 10:18 Total time:  28  Referring Provider: Clayton Lefort, MD Patient/Family location: Home [facility] Provider location: [facility] [place] [facility]  All persons participating in visit: Patient Armida Vickroy and [place]   Types of Service: Individual psychotherapy  I connected with [name] and/or Andee Poles [place]'s  n/a  via  in-person visit. Discussed confidentiality: Yes   I discussed the limitations of telemedicine and the availability of in person appointments.  Discussed there is a possibility of technology failure and discussed alternative modes of communication if that failure occurs.  I discussed that engaging in this telemedicine visit, they consent to the provision of behavioral healthcare and the services will be billed under their insurance.  Patient and/or legal guardian expressed understanding and consented to Telemedicine visit: Yes   Presenting Concerns: Patient and/or family reports the following symptoms/concerns: Being denied disability in the past, uncertainty about how to eat with gestational diabetes, continuing ebt issue; need to process feelings regarding recent loss of family members; agrees to referral to [facility] to establish with outpatient care; some worry about baby's movements.  Duration of problem: Ongoin; Severity of problem: moderate  Patient and/or Family's Strengths/Protective Factors: Social connections, Concrete supports in place (healthy food, safe environments, etc.), and Sense of purpose  Goals Addressed: Patient will:  Reduce symptoms of: stress   Increase knowledge and/or ability of: stress reduction   Demonstrate ability to: Increase adequate support systems for patient/family and Increase motivation to adhere to  plan of care  Progress towards Goals: Ongoing  Interventions: Interventions utilized:  Solution-Focused Strategies Standardized Assessments completed: GAD-7 and PHQ 9  Patient and/or Family Response: Pt agrees with treatment plan  Assessment: Patient currently experiencing Bipolar affective disorder (as previously diagnosed) and Psychosocial stress.   Patient may benefit from continued brief therapeutic interventions until firmly established with outpatient ongoing therapy and psychiatry.  Plan: Follow up with behavioral health clinician on : One month; Call Roselyn Reef at [phone], as needed Behavioral recommendations:  -Continue taking prenatal vitamin daily as prescribed -Continue working with case worker (maternity clothes) and EBT case worker (EBT problems) and WIC -Continue using Advertising account executive as needed -Accept referral to [facility] to establish with outpatient care -Consider doing intake at [facility], as discussed, to be able to access disability lawyers available to guests of the [place] allowing time and space to grieve family losses, along with sharing good memories of loved ones Referral(s): [place] (In Clinic), [place] (LME/Outside Clinic), and Community Resources:  community resources for legal, food, etc.  I discussed the assessment and treatment plan with the patient and/or parent/guardian. They were provided an opportunity to ask questions and all were answered. They agreed with the plan and demonstrated an understanding of the instructions.   They were advised to call back or seek an in-person evaluation if the symptoms worsen or if the condition fails to improve as anticipated.  Caroleen Hamman Laneisha Mino, LCSW

## 2021-07-31 ENCOUNTER — Encounter: Payer: Self-pay | Admitting: *Deleted

## 2021-08-02 ENCOUNTER — Encounter: Payer: Medicaid Other | Admitting: Family Medicine

## 2021-08-05 ENCOUNTER — Other Ambulatory Visit (HOSPITAL_COMMUNITY)
Admission: RE | Admit: 2021-08-05 | Discharge: 2021-08-05 | Disposition: A | Discharge status: Home / Self Care | Payer: Medicaid Other | Source: Ambulatory Visit | Attending: Family Medicine | Admitting: Family Medicine

## 2021-08-05 ENCOUNTER — Ambulatory Visit (INDEPENDENT_AMBULATORY_CARE_PROVIDER_SITE_OTHER): Payer: Medicaid Other | Admitting: Obstetrics and Gynecology

## 2021-08-05 ENCOUNTER — Other Ambulatory Visit: Payer: Self-pay

## 2021-08-05 ENCOUNTER — Encounter: Payer: Self-pay | Admitting: Obstetrics and Gynecology

## 2021-08-05 VITALS — BP 96/68 | HR 98 | Wt 123.9 lb

## 2021-08-05 DIAGNOSIS — O24419 Gestational diabetes mellitus in pregnancy, unspecified control: Secondary | ICD-10-CM

## 2021-08-05 DIAGNOSIS — N898 Other specified noninflammatory disorders of vagina: Secondary | ICD-10-CM

## 2021-08-05 DIAGNOSIS — E210 Primary hyperparathyroidism: Secondary | ICD-10-CM

## 2021-08-05 DIAGNOSIS — O10919 Unspecified pre-existing hypertension complicating pregnancy, unspecified trimester: Secondary | ICD-10-CM

## 2021-08-05 DIAGNOSIS — Z8719 Personal history of other diseases of the digestive system: Secondary | ICD-10-CM

## 2021-08-05 DIAGNOSIS — E039 Hypothyroidism, unspecified: Secondary | ICD-10-CM

## 2021-08-05 DIAGNOSIS — Z3A20 20 weeks gestation of pregnancy: Secondary | ICD-10-CM

## 2021-08-05 DIAGNOSIS — K5900 Constipation, unspecified: Secondary | ICD-10-CM

## 2021-08-05 DIAGNOSIS — N9489 Other specified conditions associated with female genital organs and menstrual cycle: Secondary | ICD-10-CM

## 2021-08-05 DIAGNOSIS — F3162 Bipolar disorder, current episode mixed, moderate: Secondary | ICD-10-CM

## 2021-08-05 DIAGNOSIS — N736 Female pelvic peritoneal adhesions (postinfective): Secondary | ICD-10-CM

## 2021-08-05 DIAGNOSIS — O2342 Unspecified infection of urinary tract in pregnancy, second trimester: Secondary | ICD-10-CM

## 2021-08-05 DIAGNOSIS — O34219 Maternal care for unspecified type scar from previous cesarean delivery: Secondary | ICD-10-CM

## 2021-08-05 MED ORDER — POLYETHYLENE GLYCOL 3350 17 G PO PACK: 17.0000 g | PACK | Freq: Two times a day (BID) | ORAL | 2 refills | Status: AC

## 2021-08-05 NOTE — Progress Notes (Addendum)
PRENATAL VISIT NOTE  Subjective:  Theresa Mccoy is a 41 y.o. [id number] at [redacted]w[redacted]d being seen today for ongoing prenatal care.  She is currently monitored for the following issues for this high-risk pregnancy and has Muscle cramps; Bipolar disorder, current episode mixed, moderate ([place]); Generalized abdominal pain; Generalized anxiety disorder; Essential hypertension; UTI in pregnancy; History of small bowel obstruction; Hypercalcemia; Primary hyperparathyroidism ([place]); Hypotension; Adnexal mass; Renal calculus, right; Severe bipolar I disorder, current or most recent episode depressed (); Supervision of high risk pregnancy, antepartum; Previous cesarean delivery affecting pregnancy, antepartum; S/P small bowel resection; H/O unilateral salpingectomy; Gestational diabetes mellitus (GDM) affecting pregnancy, antepartum; Hypothyroidism; Pelvic adhesive disease; and Chronic hypertension affecting pregnancy on their problem list.  Patient reports  see below .  Contractions: Not present. Vag. Bleeding: None.  Movement: Present. Denies leaking of fluid.   The following portions of the patient's history were reviewed and updated as appropriate: allergies, current medications, past family history, past medical history, past social history, past surgical history and problem list.   Objective:   Vitals:   08/05/21 1003  BP: 96/68  Pulse: 98  Weight: 123 lb 14.4 oz (56.2 kg)    Fetal Status: Fetal Heart Rate (bpm): 150   Movement: Present     General:  Alert, oriented and cooperative. Patient is in no acute distress.  Skin: Skin is warm and dry. No rash noted.   Cardiovascular: Normal heart rate noted  Respiratory: Normal respiratory effort, no problems with respiration noted  Abdomen: Soft, gravid, appropriate for gestational age.  Pain/Pressure: Present     Pelvic: Cervical exam performed in the presence of a chaperone Dilation: Closed Effacement (%): Thick  negative cough test  Extremities:  Normal range of motion.  Edema: None  Mental Status: Normal mood and affect. Normal behavior. Normal judgment and thought content.   Assessment and Plan:  Pregnancy: [id number] at [redacted]w[redacted]d 1. Pelvic adhesive disease Needs repeat c-section scheduled on weekday with two OBGYNs  2. [redacted] weeks gestation of pregnancy Has mfm anatomy u/s on 2/1 On exam room paper, it is wet. Pt states she has urine leaking out consistently for the past two weeks. Fluid smells like urine and negative spec exam today. Bedside u/s done by me today and subjectively normal amniotic fluid, single live IUP, normal FHR, +fetal movement and baby looks around 18-20 weeks - Comprehensive metabolic panel - Calcium, ionized  3. Chronic hypertension affecting pregnancy Doing well on no meds Pt confirms on low dose asa  4. Primary hyperparathyroidism ([place]) [place] called and 2/20 endocrine referral set up for her Surveillance CMP and ical ordered today. Pt asymptomatic.   5. Hypothyroidism, unspecified type Pt confirms on synthroid 75. Recheck tsh in two weeks  6. Gestational diabetes mellitus (GDM) affecting pregnancy, antepartum Pt forgot meter, book b/c she has an appointment tomorrow. F/u results from then  7. Urinary tract infection in mother during second trimester of pregnancy Toc neg but repeat ucx sent today. See above  8. Previous cesarean delivery affecting pregnancy, antepartum See above  9. Adnexal mass F/u mfm anatomy u/s. Likely hemorrhagic cyst noted a few years ago on CT  10. Hypercalcemia I told her to stop her prenatal vitamins for now - Comprehensive metabolic panel - Calcium, ionized  11. Vaginal discharge - Culture, OB Urine - Cervicovaginal ancillary only( [place])  12. Constipation, unspecified constipation type Miralax bid recommended  13. History of small bowel obstruction No e/o SBO today  14. Bipolar disorder, current  episode mixed, moderate ([facility]) Pt on no meds. It looks like  pt was last seen by psych in Sept 2021. Will send note to them to see if they can get her back plugged in with them  Preterm labor symptoms and general obstetric precautions including but not limited to vaginal bleeding, contractions, leaking of fluid and fetal movement were reviewed in detail with the patient. Please refer to After Visit Summary for other counseling recommendations.   Return in about 2 weeks (around 08/19/2021) for high risk ob, md visit, in person.  Future Appointments  Date Time Provider [place]  08/06/2021  2:15 PM [facility]  08/07/2021  9:45 AM [facility]  08/07/2021 10:30 AM [facility] NURSE [facility]  08/07/2021 10:45 AM [facility] US5 [facility]  08/26/2021  3:20 PM Shamle[place], Melanie Crazier, MD [facility] None    Aletha Halim, MD

## 2021-08-06 ENCOUNTER — Telehealth: Payer: Self-pay | Admitting: Obstetrics and Gynecology

## 2021-08-06 ENCOUNTER — Encounter: Payer: Medicaid Other | Attending: Obstetrics and Gynecology | Admitting: Registered"

## 2021-08-06 ENCOUNTER — Ambulatory Visit (INDEPENDENT_AMBULATORY_CARE_PROVIDER_SITE_OTHER): Payer: Medicaid Other | Admitting: Registered"

## 2021-08-06 ENCOUNTER — Other Ambulatory Visit: Payer: Self-pay | Admitting: Obstetrics and Gynecology

## 2021-08-06 ENCOUNTER — Other Ambulatory Visit: Payer: Self-pay | Admitting: Family Medicine

## 2021-08-06 ENCOUNTER — Telehealth: Payer: Self-pay

## 2021-08-06 ENCOUNTER — Telehealth: Payer: Self-pay | Admitting: Internal Medicine

## 2021-08-06 DIAGNOSIS — E210 Primary hyperparathyroidism: Secondary | ICD-10-CM

## 2021-08-06 DIAGNOSIS — O24419 Gestational diabetes mellitus in pregnancy, unspecified control: Secondary | ICD-10-CM

## 2021-08-06 DIAGNOSIS — Z3A00 Weeks of gestation of pregnancy not specified: Secondary | ICD-10-CM | POA: Diagnosis not present

## 2021-08-06 LAB — COMPREHENSIVE METABOLIC PANEL
ALT: 10 IU/L (ref 0–32)
AST: 13 IU/L (ref 0–40)
Albumin/Globulin Ratio: 1.3 (ref 1.2–2.2)
Albumin: 3.4 g/dL — ABNORMAL LOW (ref 3.8–4.8)
Alkaline Phosphatase: 89 IU/L (ref 44–121)
BUN/Creatinine Ratio: 11 (ref 9–23)
BUN: 8 mg/dL (ref 6–24)
Bilirubin Total: <0.2 mg/dL (ref 0.0–1.2)
CO2: 23 mmol/L (ref 20–29)
Calcium: 13.1 mg/dL (ref 8.7–10.2)
Chloride: 104 mmol/L (ref 96–106)
Creatinine, Ser: 0.75 mg/dL (ref 0.57–1.00)
Globulin, Total: 2.6 g/dL (ref 1.5–4.5)
Glucose: 89 mg/dL (ref 70–99)
Potassium: 4 mmol/L (ref 3.5–5.2)
Sodium: 135 mmol/L (ref 134–144)
Total Protein: 6 g/dL (ref 6.0–8.5)
eGFR: 103 mL/min/{1.73_m2} (ref >59)

## 2021-08-06 LAB — CERVICOVAGINAL ANCILLARY ONLY
Bacterial Vaginitis (gardnerella): NEGATIVE
Candida Glabrata: NEGATIVE
Candida Vaginitis: POSITIVE — AB
Chlamydia: NEGATIVE
Comment: NEGATIVE
Comment: NEGATIVE
Comment: NEGATIVE
Comment: NEGATIVE
Comment: NEGATIVE
Comment: NORMAL
Neisseria Gonorrhea: NEGATIVE
Trichomonas: NEGATIVE

## 2021-08-06 LAB — CALCIUM, IONIZED: Calcium, Ion: 8.2 mg/dL (ref 4.5–5.6)

## 2021-08-06 MED ORDER — METFORMIN HCL 500 MG PO TABS: 500.0000 mg | ORAL_TABLET | Freq: Two times a day (BID) | ORAL | 3 refills | Status: DC

## 2021-08-06 NOTE — Progress Notes (Signed)
Patient was seen on 08/06/21 for follow-up assessment and education for Gestational Diabetes.   EDD 12/22/21; [redacted]w[redacted]d.   Patient states she stopped taking prenatal vitamins as Dr. Ilda Basset instructed yesterday due to abnormal lab values.   Per notes in [place] today Dr. Kelton Pillar wanted her to be seen this week or next at the latest, but looks like she was not able to be scheduled until Feb 20 (3 weeks)  Patient is testing blood glucose as directed pre breakfast and 2 hours after each meal.    Patient states she has only eaten boiled eggs today (CBG 104 mg/dL). Pt states she really wanted to eat left overs from dinner last night but felt she had to save them for her boyfriend.  Patient states she has not eaten the beans she got from the [facility] because she does not have a can opener. After this visit patient will visit the market and may be able to get opener, if not pop-top cans of food.  The following learning objectives reviewed during follow-up visit:  Importance of eating a variety of foods Limit rice to 1 c serving size  Plan:  Per Dr. Dione Plover patient is to start metformin 500 mg bid with meals Visit Advertising account executive for food related items (can opener)  Patient instructed to monitor glucose levels: FBS: 60 - 95 mg/dl 2 hour: <120 mg/dl  Patient will be seen for follow-up after her blood sugar values are evaluated by MD

## 2021-08-06 NOTE — Progress Notes (Signed)
Metformin for GDM

## 2021-08-06 NOTE — Telephone Encounter (Signed)
Please bring the pt to see me either this week or next week at the latest.     Thanks

## 2021-08-06 NOTE — Telephone Encounter (Signed)
Appointment set for 08/26/21 at 3:30pm

## 2021-08-07 ENCOUNTER — Ambulatory Visit (HOSPITAL_BASED_OUTPATIENT_CLINIC_OR_DEPARTMENT_OTHER): Payer: Medicaid Other | Admitting: Obstetrics and Gynecology

## 2021-08-07 ENCOUNTER — Ambulatory Visit: Payer: Medicaid Other | Admitting: *Deleted

## 2021-08-07 ENCOUNTER — Other Ambulatory Visit: Payer: Self-pay | Admitting: *Deleted

## 2021-08-07 ENCOUNTER — Ambulatory Visit: Payer: Medicaid Other | Attending: Obstetrics and Gynecology

## 2021-08-07 ENCOUNTER — Ambulatory Visit (INDEPENDENT_AMBULATORY_CARE_PROVIDER_SITE_OTHER): Payer: 59 | Admitting: Clinical

## 2021-08-07 ENCOUNTER — Encounter: Payer: Self-pay | Admitting: *Deleted

## 2021-08-07 ENCOUNTER — Other Ambulatory Visit: Payer: Self-pay

## 2021-08-07 VITALS — BP 96/55 | HR 84

## 2021-08-07 DIAGNOSIS — E039 Hypothyroidism, unspecified: Secondary | ICD-10-CM

## 2021-08-07 DIAGNOSIS — E210 Primary hyperparathyroidism: Secondary | ICD-10-CM

## 2021-08-07 DIAGNOSIS — O24419 Gestational diabetes mellitus in pregnancy, unspecified control: Secondary | ICD-10-CM

## 2021-08-07 DIAGNOSIS — F314 Bipolar disorder, current episode depressed, severe, without psychotic features: Secondary | ICD-10-CM | POA: Diagnosis not present

## 2021-08-07 DIAGNOSIS — Z3A20 20 weeks gestation of pregnancy: Secondary | ICD-10-CM | POA: Diagnosis present

## 2021-08-07 DIAGNOSIS — O34219 Maternal care for unspecified type scar from previous cesarean delivery: Secondary | ICD-10-CM

## 2021-08-07 DIAGNOSIS — Z9079 Acquired absence of other genital organ(s): Secondary | ICD-10-CM | POA: Insufficient documentation

## 2021-08-07 DIAGNOSIS — O24415 Gestational diabetes mellitus in pregnancy, controlled by oral hypoglycemic drugs: Secondary | ICD-10-CM

## 2021-08-07 DIAGNOSIS — O09522 Supervision of elderly multigravida, second trimester: Secondary | ICD-10-CM

## 2021-08-07 DIAGNOSIS — Z9049 Acquired absence of other specified parts of digestive tract: Secondary | ICD-10-CM

## 2021-08-07 DIAGNOSIS — O0990 Supervision of high risk pregnancy, unspecified, unspecified trimester: Secondary | ICD-10-CM | POA: Diagnosis present

## 2021-08-07 DIAGNOSIS — Z658 Other specified problems related to psychosocial circumstances: Secondary | ICD-10-CM

## 2021-08-07 NOTE — Addendum Note (Signed)
Addended by: Vesta Mixer C on: 08/07/2021 10:01 AM   Modules accepted: Orders

## 2021-08-07 NOTE — Telephone Encounter (Signed)
Spoke with patient and she states that she didn't receive a call from our office to move appointment up to a sooner appointment. I did move patient up to 08/08/21 at 11:30am.

## 2021-08-07 NOTE — Progress Notes (Signed)
Maternal-Fetal Medicine   Name: Theresa Mccoy DOB: 04-08-1981 MRN: [id number] Referring Provider: Aletha Halim, MD   I had the pleasure of seeing Theresa Mccoy today at the [facility]. She is G12 P2092 at [place] 3d gestation and is here for fetal anatomy scan. Her high-risk problems include: -Primary hyperparathyroidism (PHPT). -Gestational diabetes. -Advanced maternal age. -Chronic hypertension. -Bipolar disorder. -Cigarette smoking. -Fetal echogenic intracardiac focus  Patient reports that PHPT was diagnosed in 2008 and she has been having fatigue and weakness.  From her chart, I note that in January 2021 she had nuclear medicine parathyroid scintigraphy that showed increased uptake in the inferior right lobe of the thyroid gland raising the suspicion of parathyroid adenoma.  Patient is not being followed by her endocrinologist.  Her recent serum calcium level was high (13.1 mg/DL). CT scan performed in July 2021 showed an 9 mm calculus in the lower pole of the right kidney.  Recent renal ultrasound performed in November 2022 did not show evidence of renal calculus.  Patient does not have nausea or vomiting right upper quadrant pain.  She reports fatigue and weakness intermittently.  She has a recent diagnosis of gestational diabetes and met with our diabetic educator yesterday.  She reports her fasting levels are high and metformin 500 mg twice daily was started yesterday. She has a diagnosis of chronic hypertension that is well controlled without antihypertensives.  Blood pressure today at her office is 96/55 mmHg.  -History of bipolar disorder and the patient was taking risperidone which she discontinued recently because of lack of prescription.  She reports she has been taking gabapentin for neuropathy. She has hypothyroidism and takes Synthroid 75 micrograms daily.  Past surgical history: Cesarean section, laparotomy (2021) for small bowel obstruction. Medications:  Prenatal vitamins, Synthroid, metformin, low-dose aspirin. Allergies: Codeine (constipation). Social history: Patient smokes half cigarette per day.  No alcohol or drug use.  Her partner is Theresa Mccoy and he is in good health. Family history: No history of venous thromboembolism in the family. GYN history: No history of abnormal Pap smears or cervical surgeries.  No history of breast disease. Obstetric history is significant for 2 term cesarean deliveries in 1998 and 2002.  Patient had an ectopic pregnancy in 2005 that was treated with methotrexate. Prenatal course: On cell free fetal DNA screening, the risks of fetal aneuploidies are not increased.  MSAFP screening showed low risk for open neural tube defects.  She had abnormal 2-hour glucose screening.  Recent labs Hemoglobin 10.3, hematocrit 31.7, WBC 12.8, platelets 290.  Electrolytes normal, serum calcium 13.1, AST 13, ALT 10, hemoglobin A1c 5.7%, PTH 137 (high).  Ultrasound We performed fetal anatomical survey.  Fetal biometry is consistent with the previously established dates.  Amniotic fluid is normal and good fetal activity seen.  An echogenic intracardiac focus is seen.  No other markers of aneuploidies or fetal structural defects are seen.  Our concerns include Primary hyperparathyroidism (PHPT) -Its prevalence is 0.5% but the incidence in pregnancy is not known. -Most patients are asymptomatic.  In symptomatic patients, vomiting, weakness dehydration and mental changes may occur. -Serum calcium is increased usually > 13 mg/DL.  It can be accompanied by hypokalemia and raise the serum creatinine levels. -Pregnancy complications are related to serum calcium levels.  Preeclampsia is more common.  Polyhydramnios can be seen.  Increased neonatal morbidity and mortality is seen.  Prematurity and neonatal hypocalcemia or the common causes of neonatal mortality. -Ultrasonography of the thyroid should be performed.  The most common  cause of PHPT is parathyroid adenoma.  In the presence of a neck mass and increased serum calcium level, PHPT is confirmed. -Inpatient with a definitive diagnosis of PHPT, surgery is a definitive treatment.  Complications of surgery is very low. -Medical complications include hydration, avoidance of medication that raise serum calcium levels, thiazide diuretics.  I will defer that decision to institute medical treatment to the endocrinologist. -From my literature search, one of the indications for surgery include increased serum calcium of >1 mg above the upper limit of normal.  Other indications include bone densitometry with a T score of ? -2.5 at the lumbar spine, total hip, femoral neck or the distal third of radius.  Vertebral fracture and increase his serum creatinine and symptomatic renal stones are other indications for surgery. -Surgery is ideally performed before 28 weeks of gestation (second trimester).  Gestational diabetes I explained the diagnosis of gestational diabetes.  I emphasized the importance of good blood glucose control to prevent adverse fetal or neonatal outcomes.  I discussed blood glucose normal values. I encouraged her to check her blood glucose regularly. Possible complications of gestational diabetes include fetal macrosomia, shoulder dystocia and birth injuries, stillbirth (in poorly controlled diabetes) and neonatal respiratory syndrome and other complications.  In about 85% of cases, gestational diabetes is well controlled by diet alone.  Patient will be starting metformin now. Exercise reduces the need for insulin.  Insulin may be advised if diabetes is not well controlled on metformin. I discussed ultrasound protocol and weekly BPP from 32 weeks' gestation. Timing of delivery: We recommend delivery at 39 weeks' gestation. Vaginal delivery is not contraindicated. Type 2 diabetes develops in about 25% to 40% of women with GDM. I recommend postpartum screening with 75-g  glucose load at 6 to 12 weeks after delivery.  Echogenic intracardiac focus It is present in 3% to 4% of normal fetuses.  It is also a marker for Down syndrome. I informed the patient that given that she had low rik for fetal aneuploidies on cell-free fetal DNA screening, finding of echogenic intracardiac focus should be considered a normal variant and that the risk of trisomy 21 is not increased. I also reassured that echogenic focus does not increase the risk of cardiac defects.  I explained that only amniocentesis will give a definitive result on the fetal karyotype.  I explained the procedure and possible complication of miscarriage (1 and 500 procedures).  Patient opted not to have amniocentesis.  Risperidone (Bipolar disorder) It is an antipsychotic medication in the management of schizophrenia.  It crosses the placenta.  No increased fetal congenital malformations have been reported with risperidone use in early pregnancy.  Increased to withdrawal symptoms or risk of extrapyramidal symptoms in the newborn can be expected if the drug is used in the third trimester.  I recommend referral to a psychiatrist.  Chronic hypertension I reassured the patient that she may not require antihypertensives if her blood pressures are well controlled.  I encouraged her to continue low-dose aspirin that can delay or prevent preeclampsia.  Recommendations -Patient has appointments for endocrinology consultation and a thyroid ultrasound tomorrow. -If surgery is indicated and recommended by endocrinologist, arrangements should be made for the surgery to be performed in the second trimester. -An appointment was made for her to return in 4 weeks for completion of fetal anatomy. -Fetal growth assessments every 4 weeks till delivery. -Weekly BPP from 32 weeks' gestation till delivery. -Continue Synthroid.  Thank you for consultation.  If you have any questions or concerns, please contact me the [facility].  Consultation including face-to-face (more than 50%) counseling 60 minutes.

## 2021-08-07 NOTE — Telephone Encounter (Signed)
Theresa Mccoy, can you please call her ? I need to see her before 2/20th   Thanks

## 2021-08-07 NOTE — Telephone Encounter (Signed)
Called pt and gave thyroid US appt date/time.

## 2021-08-08 ENCOUNTER — Ambulatory Visit (HOSPITAL_COMMUNITY): Admission: RE | Admit: 2021-08-08 | Payer: Medicaid Other | Source: Ambulatory Visit

## 2021-08-08 ENCOUNTER — Encounter (HOSPITAL_COMMUNITY): Payer: Self-pay

## 2021-08-08 ENCOUNTER — Ambulatory Visit: Payer: Medicaid Other | Admitting: Internal Medicine

## 2021-08-08 LAB — URINE CULTURE, OB REFLEX

## 2021-08-08 LAB — CULTURE, OB URINE

## 2021-08-08 MED ORDER — MICONAZOLE NITRATE 2 % VA CREA: 1.0000 | TOPICAL_CREAM | Freq: Every day | VAGINAL | 2 refills | Status: DC

## 2021-08-08 NOTE — Progress Notes (Incomplete)
Name: Theresa Mccoy  MRN/ DOB: [id number], [birth year]    Age/ Sex: 41 y.o., female    PCP: Kerin Perna, NP   Reason for Endocrinology Evaluation: Hypercalcemia      Date of Initial Endocrinology Evaluation: 08/08/2021     HPI: [name] is a 41 y.o. female with a past medical history of ***. The patient presented for initial endocrinology clinic visit on 08/08/2021 for consultative assistance with her Hypercalcemia.   [name] indicates that she was first diagnosed with hypercalcemia in ***, at which time ***. Since that time, she {has/has not:9025} experienced symptoms of constipation, polyuria, polydipsia, generalized weakness, diffuse muscle pains, significant memory impairment. She {DENIES:13093} use of over the counter calcium (including supplements, Tums, Rolaids, or other calcium containing antacids), lithium, HCTZ, or vitamin D supplements.   She {DENIES:13093} history of kidney stones, kidney disease, liver disease, granulomatous disease. She {DENIES:13093} osteoporosis or prior fractures. Daily dietary calcium intake: *** servings (***). She {DENIES:13093} family history of osteoporosis, parathyroid disease, thyroid disease.    HISTORY:  Past Medical History:  Past Medical History:  Diagnosis Date   AKI (acute kidney injury) ([place]) 01/22/2020   Anemia    Depression    Hypernatremia 01/22/2020   Hyperthyroidism    PTSD (post-traumatic stress disorder)    SBO (small bowel obstruction) ([place]) 07/07/2019   Sepsis ([place]) 01/22/2020   Past Surgical History:  Past Surgical History:  Procedure Laterality Date   BOWEL RESECTION N/A 07/08/2019   Procedure: Small Bowel Resection;  Surgeon: Clovis Riley, MD;  Location: [facility];  Service: General;  Laterality: N/A;   [place] OF UTERUS     HAND SURGERY Left    LAPAROTOMY N/A 07/08/2019   Procedure: EXPLORATORY LAPAROTOMY;  Surgeon: Clovis Riley, MD;  Location: [facility];  Service: General;  Laterality: N/A;   OOPHORECTOMY Right    SALPINGECTOMY      Social History:  reports that she has been smoking cigarettes. She has been smoking an average of .25 packs per day. She has never used smokeless tobacco. She reports that she does not currently use drugs after having used the following drugs: Marijuana. She reports that she does not drink alcohol. Family History: family history includes Asthma in her paternal grandmother; Diabetes in her brother.   HOME MEDICATIONS: Allergies as of 08/08/2021       Reactions   Codeine Other (See Comments)   Constipation        Medication List        Accurate as of August 08, 2021  8:35 AM. If you have any questions, ask your nurse or doctor.          Accu-Chek Softclix Lancets lancets Use four times daily as instructed.   acetaminophen 325 MG tablet Commonly known as: TYLENOL Take 2 tablets (650 mg total) by mouth every 6 (six) hours as needed for mild pain.   aspirin EC 81 MG tablet Take 1 tablet (81 mg total) by mouth daily. Swallow whole.   Blood Pressure Monitor Automat Devi 1 Device by Does not apply route daily. Automatic blood pressure cuff with regular cuff. Blood pressure to be monitored regularly at home. ICD-10 code: O09.90.   glucose blood test strip Use as instructed   levothyroxine 75 MCG tablet Commonly known as: Synthroid Take 1 tablet (75 mcg total) by mouth daily before breakfast.   metFORMIN 500 MG tablet Commonly known as: Glucophage  Take 1 tablet (500 mg total) by mouth 2 (two) times daily with a meal.   miconazole 2 % vaginal cream Commonly known as: MONISTAT 7 Place 1 Applicatorful vaginally at bedtime. Apply for seven nights   polyethylene glycol 17 g packet Commonly known as: MIRALAX / GLYCOLAX Take 17 g by mouth 2 (two) times daily.   Prenatal 27-1 MG Tabs Take 1 tablet by mouth daily.   Vitafol Ultra 29-0.6-0.4-200 MG Caps Take 1 tablet by mouth daily.           REVIEW OF SYSTEMS: A comprehensive ROS was conducted with the patient and is negative except as per HPI and below:  ROS     OBJECTIVE:  VS: LMP  (Approximate)    Wt Readings from Last 3 Encounters:  08/05/21 123 lb 14.4 oz (56.2 kg)  07/19/21 118 lb 4.8 oz (53.7 kg)  06/21/21 118 lb 4.8 oz (53.7 kg)     EXAM: General: Pt appears well and is in NAD  Hydration: Well-hydrated with moist mucous membranes and good skin turgor  Eyes: External eye exam normal without stare, lid lag or exophthalmos.  EOM intact.  PERRL.  Ears, Nose, Throat: Hearing: Grossly intact bilaterally Dental: Good dentition  Throat: Clear without mass, erythema or exudate  Neck: General: Supple without adenopathy. Thyroid: Thyroid size normal.  No goiter or nodules appreciated. No thyroid bruit.  Lungs: Clear with good BS bilat with no rales, rhonchi, or wheezes  Heart: Auscultation: RRR.  Abdomen: Normoactive bowel sounds, soft, nontender, without masses or organomegaly palpable  Extremities: Gait and station: Normal gait  Digits and nails: No clubbing, cyanosis, petechiae, or nodes Head and neck: Normal alignment and mobility BL UE: Normal ROM and strength. BL LE: No pretibial edema normal ROM and strength.  Skin: Hair: Texture and amount normal with gender appropriate distribution Skin Inspection: No rashes, acanthosis nigricans/skin tags. No lipohypertrophy Skin Palpation: Skin temperature, texture, and thickness normal to palpation  Neuro: Cranial nerves: II - XII grossly intact  Cerebellar: Normal coordination and movement; no tremor Motor: Normal strength throughout DTRs: 2+ and symmetric in UE without delay in relaxation phase  Mental Status: Judgment, insight: Intact Orientation: Oriented to time, place, and person Memory: Intact for recent and remote events Mood and affect: No depression, anxiety, or agitation     DATA REVIEWED: ***    ASSESSMENT/PLAN/RECOMMENDATIONS:    ***    Medications :  Signed electronically by: Mack Guise, MD  [facility] Endocrinology  [facility] [address] [place], [place] [address] Phone: [phone] FAX: [phone]   CC: Kerin Perna, NP [address] [place] [address] Phone: [phone] Fax: [phone]   Return to Endocrinology clinic as below: Future Appointments  Date Time Provider [place]  08/08/2021 11:30 AM Bird Swetz, Melanie Crazier, MD [facility] None  08/08/2021  1:30 PM [facility] 1 [facility]US [place]  08/21/2021  9:35 AM Caren Macadam, MD [facility]  09/04/2021  1:30 PM [facility] NURSE [facility]  09/04/2021  1:45 PM [facility] US6 [facility]  09/11/2021  8:15 AM [facility] CL[facility]

## 2021-08-08 NOTE — Telephone Encounter (Signed)
OB Note Per endocrine recs, will expedite thyroid u/s and RN to confirm that patient is not on extra calcium, like tums, and to encourage hydration  Durene Romans MD Attending Center for [employer] (Faculty Practice) 08/06/2021 Time: 1400

## 2021-08-08 NOTE — Addendum Note (Signed)
Addended by: Aletha Halim on: 08/08/2021 07:51 AM   Modules accepted: Orders

## 2021-08-09 ENCOUNTER — Telehealth: Payer: Self-pay | Admitting: General Practice

## 2021-08-09 MED ORDER — NITROFURANTOIN MONOHYD MACRO 100 MG PO CAPS: ORAL_CAPSULE | ORAL | 0 refills | Status: DC

## 2021-08-09 NOTE — Addendum Note (Signed)
Addended by: Aletha Halim on: 08/09/2021 08:21 AM   Modules accepted: Orders

## 2021-08-09 NOTE — Telephone Encounter (Signed)
-----   Message from Aletha Halim, MD sent at 08/09/2021  8:19 AM EST ----- Can y'all let her know I sent in antibiotics for her UTI? thanks

## 2021-08-09 NOTE — Telephone Encounter (Signed)
Called patient, no answer- left message stating your most recent urine culture shows you have a UTI & a prescription has been sent to your pharmacy for this. Please pick up the prescription and take twice a day for a week. You may call us back if you have questions. Per chart review, mychart message has already been sent.

## 2021-08-12 ENCOUNTER — Other Ambulatory Visit: Payer: Self-pay

## 2021-08-12 ENCOUNTER — Ambulatory Visit: Payer: Medicaid Other | Admitting: Internal Medicine

## 2021-08-12 ENCOUNTER — Encounter: Payer: Self-pay | Admitting: Internal Medicine

## 2021-08-12 ENCOUNTER — Ambulatory Visit
Admission: RE | Admit: 2021-08-12 | Discharge: 2021-08-12 | Disposition: A | Discharge status: Home / Self Care | Payer: Medicaid Other | Source: Ambulatory Visit | Attending: Internal Medicine | Admitting: Internal Medicine

## 2021-08-12 ENCOUNTER — Other Ambulatory Visit: Payer: Self-pay | Admitting: Internal Medicine

## 2021-08-12 VITALS — BP 110/62 | HR 102 | Ht 60.0 in | Wt 122.2 lb

## 2021-08-12 DIAGNOSIS — E559 Vitamin D deficiency, unspecified: Secondary | ICD-10-CM | POA: Diagnosis not present

## 2021-08-12 DIAGNOSIS — E210 Primary hyperparathyroidism: Secondary | ICD-10-CM

## 2021-08-12 DIAGNOSIS — E0590 Thyrotoxicosis, unspecified without thyrotoxic crisis or storm: Secondary | ICD-10-CM

## 2021-08-12 DIAGNOSIS — E039 Hypothyroidism, unspecified: Secondary | ICD-10-CM

## 2021-08-12 LAB — BASIC METABOLIC PANEL
BUN: 6 mg/dL (ref 6–23)
CO2: 23 mEq/L (ref 19–32)
Calcium: 13.1 mg/dL (ref 8.4–10.5)
Chloride: 106 mEq/L (ref 96–112)
Creatinine, Ser: 0.71 mg/dL (ref 0.40–1.20)
GFR: 105.86 mL/min (ref >60.00)
Glucose, Bld: 87 mg/dL (ref 70–99)
Potassium: 3.9 mEq/L (ref 3.5–5.1)
Sodium: 133 mEq/L — ABNORMAL LOW (ref 135–145)

## 2021-08-12 LAB — ALBUMIN: Albumin: 3.2 g/dL — ABNORMAL LOW (ref 3.5–5.2)

## 2021-08-12 LAB — T4: T4, Total: 16 ug/dL — ABNORMAL HIGH (ref 5.1–11.9)

## 2021-08-12 LAB — TSH: TSH: 0.01 u[IU]/mL — ABNORMAL LOW (ref 0.35–5.50)

## 2021-08-12 LAB — VITAMIN D 25 HYDROXY (VIT D DEFICIENCY, FRACTURES): VITD: 15.22 ng/mL — ABNORMAL LOW (ref 30.00–100.00)

## 2021-08-12 NOTE — Progress Notes (Signed)
Name: Theresa Mccoy  MRN/ DOB: [id number], 1980-10-30    Age/ Sex: 41 y.o., female    PCP: Kerin Perna, NP   Reason for Endocrinology Evaluation: Hypercalcemia      Date of Initial Endocrinology Evaluation: 08/12/2021     HPI: [name] is a 41 y.o. female with a past medical history of hyperparathyroidism and bipolar disorder. The patient presented for initial endocrinology clinic visit on 08/12/2021 for consultative assistance with her Hypercalcemia.   [name] indicates that she was first diagnosed with hypercalcemia since 2017 with a max level of 13.2 mg/dL ( Uncorrected ), and a PTH 137 pg/mL in 05/2021.   She is currently 21.1 weeks of gestation  EDD 12/22/2021  Per patient this is her 12th pregnancy, she has a 41 year old and a 41 year old living children.   Since that time, she  experienced symptoms of constipation, polyuria, polydipsia. She denies  use of over the counter calcium (including supplements, Tums, Rolaids, or other calcium containing antacids), lithium, HCTZ, or vitamin D supplements.   She has history of kidney stones, but no kidney disease, liver disease, or granulomatous disease. She denies osteoporosis or prior fractures. Daily dietary calcium intake: 1 servings . She denies family history of osteoporosis, parathyroid disease, thyroid disease.       THYROID HISTORY: Patient with hypothyroidism, has been on LT-4 replacement      HISTORY:  Past Medical History:  Past Medical History:  Diagnosis Date   AKI (acute kidney injury) ([place]) 01/22/2020   Anemia    Depression    Hypernatremia 01/22/2020   Hyperthyroidism    PTSD (post-traumatic stress disorder)    SBO (small bowel obstruction) ([place]) 07/07/2019   Sepsis ([place]) 01/22/2020   Past Surgical History:  Past Surgical History:  Procedure Laterality Date   BOWEL RESECTION N/A 07/08/2019   Procedure: Small Bowel Resection;  Surgeon: Clovis Riley, MD;  Location: [facility];   Service: General;  Laterality: N/A;   [place] OF UTERUS     HAND SURGERY Left    LAPAROTOMY N/A 07/08/2019   Procedure: EXPLORATORY LAPAROTOMY;  Surgeon: Clovis Riley, MD;  Location: [facility];  Service: General;  Laterality: N/A;   OOPHORECTOMY Right    SALPINGECTOMY      Social History:  reports that she has been smoking cigarettes. She has been smoking an average of .25 packs per day. She has never used smokeless tobacco. She reports that she does not currently use drugs after having used the following drugs: Marijuana. She reports that she does not drink alcohol. Family History: family history includes Asthma in her paternal grandmother; Diabetes in her brother.   HOME MEDICATIONS: Allergies as of 08/12/2021       Reactions   Codeine Other (See Comments)   Constipation        Medication List        Accurate as of August 12, 2021 11:55 AM. If you have any questions, ask your nurse or doctor.          Accu-Chek Softclix Lancets lancets Use four times daily as instructed.   acetaminophen 325 MG tablet Commonly known as: TYLENOL Take 2 tablets (650 mg total) by mouth every 6 (six) hours as needed for mild pain.   aspirin EC 81 MG tablet Take 1 tablet (81 mg total) by mouth daily. Swallow whole.   Blood Pressure Monitor Automat Devi 1 Device by Does not apply  route daily. Automatic blood pressure cuff with regular cuff. Blood pressure to be monitored regularly at home. ICD-10 code: O09.90.   glucose blood test strip Use as instructed   levothyroxine 75 MCG tablet Commonly known as: Synthroid Take 1 tablet (75 mcg total) by mouth daily before breakfast.   metFORMIN 500 MG tablet Commonly known as: Glucophage Take 1 tablet (500 mg total) by mouth 2 (two) times daily with a meal.   miconazole 2 % vaginal cream Commonly known as: MONISTAT 7 Place 1 Applicatorful vaginally at bedtime. Apply for seven nights   nitrofurantoin  (macrocrystal-monohydrate) 100 MG capsule Commonly known as: MACROBID One tab by mouth in the morning and one tab by mouth just before bed for 7 days   polyethylene glycol 17 g packet Commonly known as: MIRALAX / GLYCOLAX Take 17 g by mouth 2 (two) times daily.   Prenatal 27-1 MG Tabs Take 1 tablet by mouth daily.   Vitafol Ultra 29-0.6-0.4-200 MG Caps Take 1 tablet by mouth daily.          REVIEW OF SYSTEMS: A comprehensive ROS was conducted with the patient and is negative except as per HPI     OBJECTIVE:  VS: Pulse (!) 102    Ht 5' (1.524 m)    Wt 122 lb 3.2 oz (55.4 kg)    LMP  (Approximate)    SpO2 99%    BMI 23.87 kg/m    Wt Readings from Last 3 Encounters:  08/12/21 122 lb 3.2 oz (55.4 kg)  08/05/21 123 lb 14.4 oz (56.2 kg)  07/19/21 118 lb 4.8 oz (53.7 kg)     EXAM: General: Pt appears well and is in NAD  Neck: General: Supple without adenopathy. Thyroid: Thyroid size normal.  No goiter or nodules appreciated.  Lungs: Clear with good BS bilat with no rales, rhonchi, or wheezes  Heart: Auscultation: RRR.  Abdomen: Normoactive bowel sounds, soft, nontender, without masses or organomegaly palpable  Extremities:  BL LE: No pretibial edema normal ROM and strength.  Mental Status: Judgment, insight: Intact Orientation: Oriented to time, place, and person Mood and affect: No depression, anxiety, or agitation     DATA REVIEWED:     Latest Reference Range & Units 08/12/21 11:57  Sodium 135 - 145 mEq/L 133 (L)  Potassium 3.5 - 5.1 mEq/L 3.9  Chloride 96 - 112 mEq/L 106  CO2 19 - 32 mEq/L 23  Glucose 70 - 99 mg/dL 87  BUN 6 - 23 mg/dL 6  Creatinine 0.40 - 1.20 mg/dL 0.71  Calcium 8.4 - 10.5 mg/dL 13.1 (HH)  Albumin 3.5 - 5.2 g/dL 3.2 (L)  GFR >60.00 mL/min 105.86      Latest Reference Range & Units 08/12/21 11:57  VITD 30.00 - 100.00 ng/mL 15.22 (L)    Latest Reference Range & Units 08/12/21 11:57  TSH 0.35 - 5.50 uIU/mL 0.01 (L)    Latest  Reference Range & Units 08/12/21 11:59  Creatinine, Urine 20 - 275 mg/dL 197    Latest Reference Range & Units 08/12/21 11:59  CALCIUM, RANDOM URINE mg/dL 22.0    CT abdomen 01/22/2020  No acute intrathoracic pathology.   Nonobstructing 9 mm right renal calculus.   Large 4.2 cm complex hyperdense lesion within the left adnexa which could represent a endometrioma/hemorrhagic cyst. If further evaluation is required would recommend pelvic ultrasound.   Thyroid ultrasound 08/12/2021 Estimated total number of nodules >/= 1 cm: 0   Number of spongiform nodules >/=  2 cm  not described below (TR1): 0   Number of mixed cystic and solid nodules >/= 1.5 cm not described below ([place]): 0   _________________________________________________________   3 mm left thyroid nodule does not meet criteria for FNA or imaging surveillance.   IMPRESSION: 1. No significant abnormality of the thyroid. 2. 7 mm hypoechoic structure located posterior to the inferior aspect of the right thyroid lobe is suspicious for a parathyroid adenoma.   NM parathyroid scan 07/09/2019 Small focus of mildly persistent uptake localizing to the inferior pole of right lobe of thyroid gland. 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  ASSESSMENT/PLAN/RECOMMENDATIONS:   Primary Hyperparathyroidism   - Pt with constipation, polyuria and polydipsia  - Corrected calcium 13.74 mg/dL  -Patient MEETS surgical criteria based on age < 63, Serum calcium concentration of 1.0 mg/dL or more above the upper limit of normal, as well as history of renal stones (based on CT from 2021) -I have recommended proceeding with parathyroidectomy during second trimester, the patient is scheduled for surgical consult on 08/20/2021.  She had NM parathyroid scan from 2021 consistent with right inferior parathyroid adenoma, recent thyroid ultrasound shows a 7 mm hypoechoic structure located posterior to the inferior aspect of the  right thyroid lobe suspicious for parathyroid adenoma -I have encouraged the patient to make that appointment, transportations has been organized by [facility] department.  Patient with social determinants and has missed prior appointments with me as well as her ultrasound on 08/08/2021 due to transportation issues. -I have strongly encouraged the patient to increase hydration - AVOID CALCIUM SUPPLEMENTS, AVOID LOW CALCIUM DIET - Maintain normal dietary calcium intake (2-3 servings of dairy a day)    2. Hypothyroidism :  - TSH suppressed , will reduce levothyroxine below    Medication  Take levothyroxine 75 mcg Monday through Saturday ( Skip Sundays )   3. Vitamin D Deficiency :   -We will start vitamin D3 2000 IUs daily    Follow-up in 2 months   Signed electronically by: Mack Guise, MD  [facility] Endocrinology  [facility] [address] [place], [place] [address] Phone: [phone] FAX: [phone]   CC: Kerin Perna, NP [address] [place] [address] Phone: [phone] Fax: [phone]   Return to Endocrinology clinic as below: Future Appointments  Date Time Provider [place]  08/21/2021  9:35 AM Caren Macadam, MD [facility]  09/04/2021  1:30 PM [facility] NURSE [facility]  09/04/2021  1:45 PM [facility] US6 [facility]  09/11/2021  8:15 AM [place] [facility]

## 2021-08-12 NOTE — Patient Instructions (Signed)
-   STAY Hydrated  - Avoid over the counter calcium supplements  - Consume 2-3 servings of dietary calcium daily ( low fat cheese, yogurt/milk , green leafy vegetables )

## 2021-08-13 ENCOUNTER — Telehealth: Payer: Self-pay

## 2021-08-13 ENCOUNTER — Telehealth: Payer: Self-pay | Admitting: Internal Medicine

## 2021-08-13 LAB — CALCIUM, IONIZED: Calcium, Ion: 8.09 mg/dL (ref 4.8–5.6)

## 2021-08-13 LAB — CREATININE, URINE, RANDOM: Creatinine, Urine: 197 mg/dL (ref 20–275)

## 2021-08-13 LAB — PARATHYROID HORMONE, INTACT (NO CA): PTH: 200 pg/mL — ABNORMAL HIGH (ref 16–77)

## 2021-08-13 LAB — CALCIUM, URINE, RANDOM: CALCIUM, RANDOM URINE: 22 mg/dL

## 2021-08-13 MED ORDER — VITAMIN D3 50 MCG (2000 UT) PO CAPS: 2000.0000 [IU] | ORAL_CAPSULE | Freq: Every day | ORAL | 3 refills | Status: AC

## 2021-08-13 NOTE — Telephone Encounter (Signed)
Theresa Mccoy,    Please let [name] know that her calcium is not any better and she needs to UP her water intake needs between 8-10 glasses of water a day  I am going to start her on Vitamin D 2000 iu daily because its very low      Please let her know that she is on TOO much thyroid hormone and Needs to [facility] every Sunday from now on but take 1 tablet Monday through Saturday     Thanks

## 2021-08-13 NOTE — Telephone Encounter (Signed)
Patient notified and verbalized understanding. 

## 2021-08-13 NOTE — Telephone Encounter (Addendum)
Patient will need transportation to [facility] 08/20/21 at 1:30 PM. Pt has medicaid, will need to set up transportation. Clinical staff unable to find Medicaid transportation for patient due to type of Medicaid. Report given to [facility] care manager on 08/15/21.   Update received on 08/19/21: Belle, Care Manager, states she has been unable to coordinate Medicaid transportation. Currently speaking with DSS to see if they can provide transportation. Patient does have a friend who may be able to drive her to appointment. Theresa Mccoy has attempted to contact patient by call and text today with no success.

## 2021-08-21 ENCOUNTER — Encounter: Payer: Self-pay | Admitting: Family Medicine

## 2021-08-21 ENCOUNTER — Other Ambulatory Visit: Payer: Self-pay

## 2021-08-21 ENCOUNTER — Ambulatory Visit (INDEPENDENT_AMBULATORY_CARE_PROVIDER_SITE_OTHER): Payer: Medicaid Other | Admitting: Family Medicine

## 2021-08-21 VITALS — BP 99/70 | HR 101 | Wt 119.9 lb

## 2021-08-21 DIAGNOSIS — O34219 Maternal care for unspecified type scar from previous cesarean delivery: Secondary | ICD-10-CM

## 2021-08-21 DIAGNOSIS — N9489 Other specified conditions associated with female genital organs and menstrual cycle: Secondary | ICD-10-CM

## 2021-08-21 DIAGNOSIS — O24419 Gestational diabetes mellitus in pregnancy, unspecified control: Secondary | ICD-10-CM

## 2021-08-21 DIAGNOSIS — N736 Female pelvic peritoneal adhesions (postinfective): Secondary | ICD-10-CM

## 2021-08-21 DIAGNOSIS — Z9079 Acquired absence of other genital organ(s): Secondary | ICD-10-CM

## 2021-08-21 DIAGNOSIS — E210 Primary hyperparathyroidism: Secondary | ICD-10-CM

## 2021-08-21 DIAGNOSIS — O0990 Supervision of high risk pregnancy, unspecified, unspecified trimester: Secondary | ICD-10-CM

## 2021-08-21 DIAGNOSIS — E039 Hypothyroidism, unspecified: Secondary | ICD-10-CM

## 2021-08-21 DIAGNOSIS — F3162 Bipolar disorder, current episode mixed, moderate: Secondary | ICD-10-CM

## 2021-08-21 DIAGNOSIS — O10919 Unspecified pre-existing hypertension complicating pregnancy, unspecified trimester: Secondary | ICD-10-CM

## 2021-08-21 DIAGNOSIS — O2342 Unspecified infection of urinary tract in pregnancy, second trimester: Secondary | ICD-10-CM

## 2021-08-21 MED ORDER — METFORMIN HCL 1000 MG PO TABS: 1000.0000 mg | ORAL_TABLET | Freq: Two times a day (BID) | ORAL | 5 refills | Status: DC

## 2021-08-21 NOTE — Patient Instructions (Signed)

## 2021-08-21 NOTE — Progress Notes (Signed)
Subjective:  Theresa Mccoy is a 41 y.o. [id number] at [redacted]w[redacted]d being seen today for ongoing prenatal care.  She is currently monitored for the following issues for this high-risk pregnancy and has Muscle cramps; Bipolar disorder, current episode mixed, moderate ([place]); Generalized abdominal pain; Generalized anxiety disorder; Essential hypertension; UTI in pregnancy; History of small bowel obstruction; Hypercalcemia; Primary hyperparathyroidism ([place]); Hypotension; Adnexal mass; Renal calculus, right; Severe bipolar I disorder, current or most recent episode depressed ([place]); Supervision of high risk pregnancy, antepartum; Previous cesarean delivery affecting pregnancy, antepartum; S/P small bowel resection; H/O unilateral salpingectomy; Gestational diabetes mellitus (GDM) affecting pregnancy, antepartum; Hypothyroidism; Pelvic adhesive disease; Chronic hypertension affecting pregnancy; and Acquired hypothyroidism on their problem list.  Patient reports no complaints.  Contractions: Not present. Vag. Bleeding: None.  Movement: Present. Denies leaking of fluid.   The following portions of the patient's history were reviewed and updated as appropriate: allergies, current medications, past family history, past medical history, past social history, past surgical history and problem list. Problem list updated.  Objective:   Vitals:   08/21/21 1023  BP: 99/70  Pulse: (!) 101  Weight: 119 lb 14.4 oz (54.4 kg)    Fetal Status: Fetal Heart Rate (bpm): 135   Movement: Present     General:  Alert, oriented and cooperative. Patient is in no acute distress.  Skin: Skin is warm and dry. No rash noted.   Cardiovascular: Normal heart rate noted  Respiratory: Normal respiratory effort, no problems with respiration noted  Abdomen: Soft, gravid, appropriate for gestational age. Pain/Pressure: Present     Pelvic: Vag. Bleeding: None     Cervical exam deferred        Extremities: Normal range of motion.  Edema:  None  Mental Status: Normal mood and affect. Normal behavior. Normal judgment and thought content.   Urinalysis:      Assessment and Plan:  Pregnancy: [name] at [redacted]w[redacted]d  1. Supervision of high risk pregnancy, antepartum BP and FHR normal  2. Previous cesarean delivery affecting pregnancy, antepartum X2, dense adhesions noted on op note from 07/08/2019 from [facility] that required bowel resection Plan to schedule with two attendings for repeat  3. Pelvic adhesive disease See above  4. Adnexal mass Unremarkable [place] on 05/26/2021  5. Primary hyperparathyroidism ([place]) Seen by Endo on 08/12/2021 Given overall clinical picture surgery is indicated and they are moving forward with that plan Has appt with [facility] on 09/16/2021 for surgical planning Patient is aware of the appt, plans being made to help with transportation  6. Chronic hypertension affecting pregnancy Normotensive, no meds On ASA  7. Gestational diabetes mellitus (GDM) affecting pregnancy, antepartum Abnormal A1c followed by abnormal 2hr GTT On metformin 500mg  BID, reports she is taking it Not consistently checking sugars, did not bring log From recall: Reports AM fastings are 130's Postprandials largely uncontrolled, gettings values in the 120-160's Stressed the importance of checking sugars regularly Increase metformin to 1g BID, rtc in 2 weeks, may need admission for initiation of insulin depending on her sugars  8. Acquired hypothyroidism Synthroid decreased to 37mcg at last Endo visit  9. Urinary tract infection in mother during second trimester of pregnancy Klebsiella UTI on last culture from 08/05/21, treated with macrobid TOC at next visit  10. Bipolar disorder, current episode mixed, moderate ([facility]) No meds Reports mood is good  11. H/O unilateral salpingectomy Surgically absent R ovary noted on [place] from 05/26/2021  Preterm labor symptoms and general obstetric precautions including but not  limited to vaginal bleeding, contractions, leaking of fluid and fetal movement were reviewed in detail with the patient. Please refer to After Visit Summary for other counseling recommendations.  Return in 2 weeks (on 09/04/2021) for [facility], ob visit, needs MD.   Clarnce Flock, MD

## 2021-08-26 ENCOUNTER — Ambulatory Visit: Payer: Medicaid Other | Admitting: Internal Medicine

## 2021-08-30 NOTE — BH Specialist Note (Deleted)
Integrated Behavioral Health via Telemedicine Visit ? ?08/30/2021 ?[name] ?[id number] ? ?Number of [place] Clinician visits: No data recorded ?Session Start time: No data recorded  ?Session End time: No data recorded ?Total time in minutes: No data recorded ? ?Referring Provider: *** ?Patient/Family location: Home*** ?[facility] Provider location: [facility] [employer] at [facility] ? ?All persons participating in visit: Patient *** and [name] *** ? ?Types of Service: {CHL AMB TYPE OF [phone]} ? ?I connected with [name] and/or [name]'s {family members:20773} via  Telephone or Video Enabled Telemedicine Application  (Video is Caregility application) and verified that I am speaking with the correct person using two identifiers. Discussed confidentiality: {YES/NO:21197} ? ?I discussed the limitations of telemedicine and the availability of in person appointments.  Discussed there is a possibility of technology failure and discussed alternative modes of communication if that failure occurs. ? ?I discussed that engaging in this telemedicine visit, they consent to the provision of behavioral healthcare and the services will be billed under their insurance. ? ?Patient and/or legal guardian expressed understanding and consented to Telemedicine visit: {YES/NO:21197} ? ?Presenting Concerns: ?Patient and/or family reports the following symptoms/concerns: *** ?Duration of problem: ***; Severity of problem: {Mild/Moderate/Severe:20260} ? ?Patient and/or Family's Strengths/Protective Factors: ?{CHL AMB BH PROTECTIVE FACTORS:[phone]} ? ?Goals Addressed: ?Patient will: ? Reduce symptoms of: {IBH Symptoms:21014056}  ? Increase knowledge and/or ability of: {IBH Patient Tools:21014057}  ? Demonstrate ability to: {IBH Goals:21014053} ? ?Progress towards Goals: ?{CHL AMB BH PROGRESS TOWARDS VQMGQ:[id number]} ? ?Interventions: ?Interventions  utilized:  {IBH Interventions:21014054} ?Standardized Assessments completed: {IBH Screening Tools:21014051} ? ?Patient and/or Family Response: *** ? ?Assessment: ?Patient currently experiencing ***.  ? ?Patient may benefit from ***. ? ?Plan: ?Follow up with behavioral health clinician on : *** ?Behavioral recommendations: *** ?Referral(s): {IBH Referrals:21014055} ? ?I discussed the assessment and treatment plan with the patient and/or parent/guardian. They were provided an opportunity to ask questions and all were answered. They agreed with the plan and demonstrated an understanding of the instructions. ?  ?They were advised to call back or seek an in-person evaluation if the symptoms worsen or if the condition fails to improve as anticipated. ? ?[name], LCSW ?

## 2021-09-04 ENCOUNTER — Ambulatory Visit (INDEPENDENT_AMBULATORY_CARE_PROVIDER_SITE_OTHER): Payer: Medicaid Other | Admitting: Obstetrics & Gynecology

## 2021-09-04 ENCOUNTER — Other Ambulatory Visit: Payer: Self-pay | Admitting: *Deleted

## 2021-09-04 ENCOUNTER — Ambulatory Visit: Payer: Managed Care, Other (non HMO) | Admitting: *Deleted

## 2021-09-04 ENCOUNTER — Ambulatory Visit: Payer: Managed Care, Other (non HMO) | Attending: Obstetrics and Gynecology

## 2021-09-04 ENCOUNTER — Other Ambulatory Visit: Payer: Self-pay

## 2021-09-04 VITALS — BP 101/67 | HR 113

## 2021-09-04 VITALS — BP 97/66 | HR 94 | Wt 122.2 lb

## 2021-09-04 DIAGNOSIS — O0990 Supervision of high risk pregnancy, unspecified, unspecified trimester: Secondary | ICD-10-CM

## 2021-09-04 DIAGNOSIS — E039 Hypothyroidism, unspecified: Secondary | ICD-10-CM | POA: Diagnosis not present

## 2021-09-04 DIAGNOSIS — O24419 Gestational diabetes mellitus in pregnancy, unspecified control: Secondary | ICD-10-CM

## 2021-09-04 DIAGNOSIS — O09522 Supervision of elderly multigravida, second trimester: Secondary | ICD-10-CM

## 2021-09-04 DIAGNOSIS — O34219 Maternal care for unspecified type scar from previous cesarean delivery: Secondary | ICD-10-CM

## 2021-09-04 DIAGNOSIS — O24415 Gestational diabetes mellitus in pregnancy, controlled by oral hypoglycemic drugs: Secondary | ICD-10-CM

## 2021-09-04 DIAGNOSIS — Z9049 Acquired absence of other specified parts of digestive tract: Secondary | ICD-10-CM | POA: Insufficient documentation

## 2021-09-04 DIAGNOSIS — F319 Bipolar disorder, unspecified: Secondary | ICD-10-CM

## 2021-09-04 DIAGNOSIS — Z3A24 24 weeks gestation of pregnancy: Secondary | ICD-10-CM

## 2021-09-04 DIAGNOSIS — E210 Primary hyperparathyroidism: Secondary | ICD-10-CM

## 2021-09-04 DIAGNOSIS — O10912 Unspecified pre-existing hypertension complicating pregnancy, second trimester: Secondary | ICD-10-CM

## 2021-09-04 DIAGNOSIS — O10919 Unspecified pre-existing hypertension complicating pregnancy, unspecified trimester: Secondary | ICD-10-CM

## 2021-09-04 DIAGNOSIS — Z9079 Acquired absence of other genital organ(s): Secondary | ICD-10-CM | POA: Diagnosis present

## 2021-09-04 MED ORDER — INSULIN ASPART 100 UNIT/ML IJ SOLN: INTRAMUSCULAR | 12 refills | Status: DC

## 2021-09-04 MED ORDER — INSULIN NPH (HUMAN) (ISOPHANE) 100 UNIT/ML ~~LOC~~ SUSP: SUBCUTANEOUS | 11 refills | Status: DC

## 2021-09-04 MED ORDER — INSULIN SYRINGES (DISPOSABLE) U-100 1 ML MISC: 2 refills | Status: DC

## 2021-09-04 NOTE — Progress Notes (Signed)
? ?PRENATAL VISIT NOTE ? ?Subjective:  ?Theresa Mccoy is a 41 y.o. [id number] at [redacted]w[redacted]d being seen today for ongoing prenatal care.  She is currently monitored for the following issues for this high-risk pregnancy and has Muscle cramps; Bipolar disorder, current episode mixed, moderate ([place]); Generalized abdominal pain; Generalized anxiety disorder; Essential hypertension; UTI in pregnancy; History of small bowel obstruction; Hypercalcemia; Primary hyperparathyroidism ([place]); Hypotension; Adnexal mass; Renal calculus, right; Severe bipolar I disorder, current or most recent episode depressed ([place]); Supervision of high risk pregnancy, antepartum; Previous cesarean delivery affecting pregnancy, antepartum; S/P small bowel resection; H/O unilateral salpingectomy; Gestational diabetes mellitus (GDM) affecting pregnancy, antepartum; Hypothyroidism; Pelvic adhesive disease; Chronic hypertension affecting pregnancy; and Acquired hypothyroidism on their problem list. ? ?Patient reports no complaints.  Contractions: Not present. Vag. Bleeding: None.  Movement: Present. Denies leaking of fluid.  ? ?The following portions of the patient's history were reviewed and updated as appropriate: allergies, current medications, past family history, past medical history, past social history, past surgical history and problem list.  ? ?Objective:  ? ?Vitals:  ? 09/04/21 1513  ?BP: 97/66  ?Pulse: 94  ?Weight: 122 lb 3.2 oz (55.4 kg)  ? ? ?Fetal Status: Fetal Heart Rate (bpm): 148   Movement: Present    ? ?General:  Alert, oriented and cooperative. Patient is in no acute distress.  ?Skin: Skin is warm and dry. No rash noted.   ?Cardiovascular: Normal heart rate noted  ?Respiratory: Normal respiratory effort, no problems with respiration noted  ?Abdomen: Soft, gravid, appropriate for gestational age.  Pain/Pressure: Present     ?Pelvic: Cervical exam deferred        ?Extremities: Normal range of motion.  Edema: None  ?Mental Status: Normal  mood and affect. Normal behavior. Normal judgment and thought content.  ? ?Assessment and Plan:  ?Pregnancy: [id number] at [redacted]w[redacted]d ?1. Gestational diabetes mellitus (GDM) affecting pregnancy, antepartum ?Patient did not bring log today but reports fasting today was 182, after meals around 170s-180s.  On Metformin 1000 g bid, she said this is not working. Recommended admission for glycemic control, she declined. Will start insulin therapy, she reports helping a family member with this in the past, and said she can do it. ?Calculated Initial Insulin Regimen (using www.perinatology.com calculator  and modified a bit)  ?Breakfast : Rapid-acting or regular insulin 10 units [place] and NPH insulin 20 units [place] .  ?Dinner : Rapid-acting or regular insulin 10 units [place].  ?Hour of Sleep (HS): NPH insulin 10 units [place] ?- insulin aspart (NOVOLOG) 100 UNIT/ML injection; Inject 10 units before breakfast and before dinner  Dispense: 10 mL; Refill: 12 ?- insulin NPH Human (HUMULIN N) 100 UNIT/ML injection; Inject 20 units before breakfast and 10 units at bedtime  Dispense: 10 mL; Refill: 11 ?Will reevaluate in one week. If still not optimal, will recommend admission. ? ?2. Chronic hypertension affecting pregnancy ?Stable BP. ? ?3. Previous cesarean delivery affecting pregnancy, antepartum ?Extensive adhesive disease, will need proper planning for repeat cesarean section ? ?4. Primary hyperparathyroidism ([place]) ?Appointment with General Surgery soon, will follow up recommendations ? ?5. [redacted] weeks gestation of pregnancy ?6. Supervision of high risk pregnancy, antepartum ?Preterm labor symptoms and general obstetric precautions including but not limited to vaginal bleeding, contractions, leaking of fluid and fetal movement were reviewed in detail with the patient. ?Please refer to After Visit Summary for other counseling recommendations.  ? ?Return in about 1 week (around 09/11/2021) for OFFICE OB VISIT (MD only) for blood sugar review  after insulin  start. ? ?Future Appointments  ?Date Time Provider [place]  ?09/11/2021  8:15 AM [facility] CLIN[facility] [place]  ?10/02/2021  3:15 PM [facility] NURSE [facility]  ?10/02/2021  3:30 PM [facility] US3 [facility] [place]  ?10/07/2021 11:30 AM Shamleffer, Melanie Crazier, MD [facility] None  ?10/29/2021 11:00 AM Goldammer, Baruch Merl, LCSW [facility] None  ?10/30/2021  2:30 PM [facility] NURSE [facility]  ?10/30/2021  2:45 PM [facility] US5 [facility]  ? ? ?Verita Schneiders, MD ? ?

## 2021-09-05 ENCOUNTER — Ambulatory Visit: Payer: Medicaid Other | Admitting: Internal Medicine

## 2021-09-06 ENCOUNTER — Telehealth: Payer: Self-pay

## 2021-09-06 MED ORDER — INSULIN LISPRO 100 UNIT/ML IJ SOLN: INTRAMUSCULAR | 11 refills | Status: DC

## 2021-09-06 NOTE — Telephone Encounter (Signed)
Patient's pharmacy, [facility] on [address], called and left voicemail on nurse line stating that Novolog prescription is not covered by patient's insurance and they recommend Humalog prescription be used in place. I called patient's pharmacy back and provided verbal order to pharmacist for Humalog in place of Novolog. ? ?Paulina Fusi, RN ?09/06/21 ?

## 2021-09-06 NOTE — BH Specialist Note (Signed)
Integrated Behavioral Health via Telemedicine Visit ? ?09/16/2021 ?[name] ?[id number] ? ?Number of [place] Clinician visits: 5-Fifth Visit ? ?Session Start time: [phone] ?  ?Session End time: 0900 ? ?Total time in minutes: 9 ? ? ?Referring Provider: Mora Bellman, MD ?Patient/Family location: Hospital ?[facility] Provider location: [facility] [place] at [facility] ? ?All persons participating in visit: [name] and [place]  ? ?Types of Service: Individual psychotherapy and Video visit ? ?I connected with [name] and/or Andee Poles Scruggs's  n/a  via  Telephone or Video Enabled Telemedicine Application  (Video is Caregility application) and verified that I am speaking with the correct person using two identifiers. Discussed confidentiality: Yes  ? ?I discussed the limitations of telemedicine and the availability of in person appointments.  Discussed there is a possibility of technology failure and discussed alternative modes of communication if that failure occurs. ? ?I discussed that engaging in this telemedicine visit, they consent to the provision of behavioral healthcare and the services will be billed under their insurance. ? ?Patient and/or legal guardian expressed understanding and consented to Telemedicine visit: Yes  ? ?Presenting Concerns: ?Patient and/or family reports the following symptoms/concerns: Pt currently in the hospital for hypoglycemia; pt says she's starting [facility] medication in the hospital, and has no concerns at this time; will attend scheduled appointment with [facility] outpatient.  ?Duration of problem: Ongoing; Severity of problem: severe ? ?Patient and/or Family's Strengths/Protective Factors: ?Social connections, [place] supports in place (healthy food, safe environments, etc.), Sense of purpose, and Physical Health (exercise, healthy diet, medication compliance, etc.) ? ?Goals Addressed: ?Patient will: ? Reduce  symptoms of: stress  ? ?Progress towards Goals: ?Ongoing ? ?Interventions: ?Interventions utilized:  Supportive Reflection ?Standardized Assessments completed: Not Needed ? ?Patient and/or Family Response: Patient agrees with treatment plan. ? ? ?Assessment: ?Patient currently experiencing Bipolar affective disorder (as previously diagnosed) and Psychosocial stress ? ?Patient may benefit from brief therapeutic intervention today and ongoing [place] care with psychiatry in outpatient . ? ?Plan: ?Follow up with behavioral health clinician on : Call Raeqwon Lux at [phone], as needed ?Behavioral recommendations:  ?-Continue plan to take prenatal vitamin and medications as prescribed ?-Continue plan to attend all upcoming medical and behavioral health appointments ? ?Referral(s): [place] (In Clinic) ? ?I discussed the assessment and treatment plan with the patient and/or parent/guardian. They were provided an opportunity to ask questions and all were answered. They agreed with the plan and demonstrated an understanding of the instructions. ?  ?They were advised to call back or seek an in-person evaluation if the symptoms worsen or if the condition fails to improve as anticipated. ? ?[name], LCSW ?

## 2021-09-11 ENCOUNTER — Encounter (HOSPITAL_COMMUNITY): Payer: Self-pay

## 2021-09-11 ENCOUNTER — Other Ambulatory Visit: Payer: Self-pay

## 2021-09-11 ENCOUNTER — Inpatient Hospital Stay (HOSPITAL_COMMUNITY)
Admission: EM | Admit: 2021-09-11 | Discharge: 2021-09-16 | DRG: 818 | Disposition: A | Discharge status: Home / Self Care | Payer: Commercial Managed Care - HMO | Attending: Obstetrics and Gynecology | Admitting: Obstetrics and Gynecology

## 2021-09-11 DIAGNOSIS — O99332 Smoking (tobacco) complicating pregnancy, second trimester: Secondary | ICD-10-CM | POA: Diagnosis present

## 2021-09-11 DIAGNOSIS — E210 Primary hyperparathyroidism: Secondary | ICD-10-CM | POA: Diagnosis present

## 2021-09-11 DIAGNOSIS — N179 Acute kidney failure, unspecified: Secondary | ICD-10-CM | POA: Diagnosis not present

## 2021-09-11 DIAGNOSIS — F3162 Bipolar disorder, current episode mixed, moderate: Secondary | ICD-10-CM

## 2021-09-11 DIAGNOSIS — O0990 Supervision of high risk pregnancy, unspecified, unspecified trimester: Secondary | ICD-10-CM

## 2021-09-11 DIAGNOSIS — O2342 Unspecified infection of urinary tract in pregnancy, second trimester: Secondary | ICD-10-CM | POA: Diagnosis present

## 2021-09-11 DIAGNOSIS — O26832 Pregnancy related renal disease, second trimester: Secondary | ICD-10-CM | POA: Diagnosis not present

## 2021-09-11 DIAGNOSIS — O24419 Gestational diabetes mellitus in pregnancy, unspecified control: Secondary | ICD-10-CM | POA: Diagnosis present

## 2021-09-11 DIAGNOSIS — O10919 Unspecified pre-existing hypertension complicating pregnancy, unspecified trimester: Secondary | ICD-10-CM | POA: Diagnosis present

## 2021-09-11 DIAGNOSIS — N3000 Acute cystitis without hematuria: Secondary | ICD-10-CM | POA: Diagnosis present

## 2021-09-11 DIAGNOSIS — Z3A25 25 weeks gestation of pregnancy: Secondary | ICD-10-CM | POA: Diagnosis not present

## 2021-09-11 DIAGNOSIS — F17210 Nicotine dependence, cigarettes, uncomplicated: Secondary | ICD-10-CM | POA: Diagnosis present

## 2021-09-11 DIAGNOSIS — O99342 Other mental disorders complicating pregnancy, second trimester: Secondary | ICD-10-CM | POA: Diagnosis present

## 2021-09-11 DIAGNOSIS — O24414 Gestational diabetes mellitus in pregnancy, insulin controlled: Principal | ICD-10-CM | POA: Diagnosis present

## 2021-09-11 DIAGNOSIS — E039 Hypothyroidism, unspecified: Secondary | ICD-10-CM | POA: Diagnosis present

## 2021-09-11 DIAGNOSIS — D351 Benign neoplasm of parathyroid gland: Secondary | ICD-10-CM | POA: Diagnosis present

## 2021-09-11 DIAGNOSIS — E162 Hypoglycemia, unspecified: Principal | ICD-10-CM

## 2021-09-11 DIAGNOSIS — D509 Iron deficiency anemia, unspecified: Secondary | ICD-10-CM | POA: Diagnosis present

## 2021-09-11 DIAGNOSIS — O2340 Unspecified infection of urinary tract in pregnancy, unspecified trimester: Secondary | ICD-10-CM | POA: Diagnosis present

## 2021-09-11 DIAGNOSIS — O10012 Pre-existing essential hypertension complicating pregnancy, second trimester: Secondary | ICD-10-CM | POA: Diagnosis present

## 2021-09-11 DIAGNOSIS — O99282 Endocrine, nutritional and metabolic diseases complicating pregnancy, second trimester: Secondary | ICD-10-CM | POA: Diagnosis present

## 2021-09-11 DIAGNOSIS — O2312 Infections of bladder in pregnancy, second trimester: Secondary | ICD-10-CM | POA: Diagnosis present

## 2021-09-11 DIAGNOSIS — E11649 Type 2 diabetes mellitus with hypoglycemia without coma: Secondary | ICD-10-CM | POA: Diagnosis not present

## 2021-09-11 DIAGNOSIS — F314 Bipolar disorder, current episode depressed, severe, without psychotic features: Secondary | ICD-10-CM | POA: Diagnosis present

## 2021-09-11 DIAGNOSIS — O34219 Maternal care for unspecified type scar from previous cesarean delivery: Secondary | ICD-10-CM | POA: Diagnosis present

## 2021-09-11 DIAGNOSIS — F4310 Post-traumatic stress disorder, unspecified: Secondary | ICD-10-CM | POA: Diagnosis present

## 2021-09-11 DIAGNOSIS — Z20822 Contact with and (suspected) exposure to covid-19: Secondary | ICD-10-CM | POA: Diagnosis present

## 2021-09-11 DIAGNOSIS — O99012 Anemia complicating pregnancy, second trimester: Secondary | ICD-10-CM | POA: Diagnosis present

## 2021-09-11 HISTORY — DX: Type 2 diabetes mellitus without complications: E11.9

## 2021-09-11 LAB — CBC WITH DIFFERENTIAL/PLATELET
Abs Immature Granulocytes: 0.08 10*3/uL — ABNORMAL HIGH (ref 0.00–0.07)
Basophils Absolute: 0 10*3/uL (ref 0.0–0.1)
Basophils Relative: 0 %
Eosinophils Absolute: 0 10*3/uL (ref 0.0–0.5)
Eosinophils Relative: 0 %
HCT: 29.4 % — ABNORMAL LOW (ref 36.0–46.0)
Hemoglobin: 9.9 g/dL — ABNORMAL LOW (ref 12.0–15.0)
Immature Granulocytes: 1 %
Lymphocytes Relative: 13 %
Lymphs Abs: 1.7 10*3/uL (ref 0.7–4.0)
MCH: 31.8 pg (ref 26.0–34.0)
MCHC: 33.7 g/dL (ref 30.0–36.0)
MCV: 94.5 fL (ref 80.0–100.0)
Monocytes Absolute: 0.7 10*3/uL (ref 0.1–1.0)
Monocytes Relative: 6 %
Neutro Abs: 10.7 10*3/uL — ABNORMAL HIGH (ref 1.7–7.7)
Neutrophils Relative %: 80 %
Platelets: 287 10*3/uL (ref 150–400)
RBC: 3.11 MIL/uL — ABNORMAL LOW (ref 3.87–5.11)
RDW: 13.6 % (ref 11.5–15.5)
WBC: 13.3 10*3/uL — ABNORMAL HIGH (ref 4.0–10.5)
nRBC: 0 % (ref 0.0–0.2)

## 2021-09-11 LAB — CBG MONITORING, ED: Glucose-Capillary: 127 mg/dL — ABNORMAL HIGH (ref 70–99)

## 2021-09-11 LAB — BASIC METABOLIC PANEL
Anion gap: 7 (ref 5–15)
BUN: 6 mg/dL (ref 6–20)
CO2: 16 mmol/L — ABNORMAL LOW (ref 22–32)
Calcium: 12.1 mg/dL — ABNORMAL HIGH (ref 8.9–10.3)
Chloride: 112 mmol/L — ABNORMAL HIGH (ref 98–111)
Creatinine, Ser: 1.1 mg/dL — ABNORMAL HIGH (ref 0.44–1.00)
GFR, Estimated: >60 mL/min (ref >60)
Glucose, Bld: 132 mg/dL — ABNORMAL HIGH (ref 70–99)
Potassium: 3.7 mmol/L (ref 3.5–5.1)
Sodium: 135 mmol/L (ref 135–145)

## 2021-09-11 NOTE — ED Notes (Signed)
ED Provider at bedside. 

## 2021-09-11 NOTE — ED Provider Notes (Signed)
?[place] ?Provider Note ? ? ?CSN: [id number] ?Arrival date & time:    ? ?  ? ?History ? ?Chief Complaint  ?Patient presents with  ? Hypoglycemia  ? ? ?Theresa Mccoy is a 41 y.o. female. ? ?The history is provided by the patient and medical records.  ?Hypoglycemia ? ?41 y.o. [id number] approx [redacted] weeks gestation, presenting to the ED for hypoglycemia.  Reportedly she was diagnosed with gestational diabetes last week and routine prenatal visit.  She was started on insulin, pick this up 2 days ago and began taking as directed.  Today, husband came home and found her lying down and seem lethargic.  EMS was called and CBG was 43 upon their arrival.  She was given D10 with increase in sugar to 110.  She is awake and alert on arrival to ED.  She denies any nausea or vomiting.  She is not had any vaginal bleeding or loss of fluid.  She is still feeling some fetal movement. ? ?Home Medications ?Prior to Admission medications   ?Medication Sig Start Date End Date Taking? Authorizing Provider  ?Accu-Chek Softclix Lancets lancets Use four times daily as instructed. 07/23/21   Clarnce Flock, MD  ?aspirin EC 81 MG tablet Take 1 tablet (81 mg total) by mouth daily. Swallow whole. 07/19/21   Clarnce Flock, MD  ?Blood Pressure Monitoring (BLOOD PRESSURE MONITOR AUTOMAT) DEVI 1 Device by Does not apply route daily. Automatic blood pressure cuff with regular cuff. Blood pressure to be monitored regularly at home. ICD-10 code: O09.90. 05/17/19   Donnamae Jude, MD  ?Cholecalciferol (VITAMIN D3) 50 MCG (2000 UT) capsule Take 1 capsule (2,000 Units total) by mouth daily. 08/13/21   Shamleffer, Melanie Crazier, MD  ?glucose blood test strip Use as instructed 07/23/21   Clarnce Flock, MD  ?insulin lispro (HUMALOG) 100 UNIT/ML injection 10 units before breakfast and before dinner 09/06/21   Anyanwu, Sallyanne Havers, MD  ?insulin NPH Human (HUMULIN N) 100 UNIT/ML injection Inject 20 units before  breakfast and 10 units at bedtime 09/04/21   Anyanwu, Sallyanne Havers, MD  ?Insulin Syringes, Disposable, U-100 1 ML MISC Use as directed 09/04/21   Anyanwu, Sallyanne Havers, MD  ?levothyroxine (SYNTHROID) 75 MCG tablet Take 1 tablet (75 mcg total) by mouth daily before breakfast. 07/19/21   Clarnce Flock, MD  ?metFORMIN (GLUCOPHAGE) 1000 MG tablet Take 1 tablet (1,000 mg total) by mouth 2 (two) times daily with a meal. 08/21/21   Clarnce Flock, MD  ?polyethylene glycol (MIRALAX / GLYCOLAX) 17 g packet Take 17 g by mouth 2 (two) times daily. 08/05/21   Aletha Halim, MD  ?Prenatal 27-1 MG TABS Take 1 tablet by mouth daily. ?Patient not taking: Reported on 08/07/2021 06/21/21   Constant, Vickii Chafe, MD  ?   ? ?Allergies    ?Codeine   ? ?Review of Systems   ?Review of Systems  ?Endocrine:  ?     Hypoglycemia  ?All other systems reviewed and are negative. ? ?Physical Exam ?Updated Vital Signs ?BP 108/80   Pulse 89   Temp 97.6 ?F (36.4 ?C) (Oral)   Resp 14   Ht 5' (1.524 m)   Wt 55.3 kg   LMP  (Approximate)   SpO2 100%   BMI 23.83 kg/m?  ? ?Physical Exam ?Vitals and nursing note reviewed.  ?Constitutional:   ?   Appearance: She is well-developed.  ?HENT:  ?   Head: Normocephalic and atraumatic.  ?Eyes:  ?  Conjunctiva/sclera: Conjunctivae normal.  ?   Pupils: Pupils are equal, round, and reactive to light.  ?Cardiovascular:  ?   Rate and Rhythm: Normal rate and regular rhythm.  ?   Heart sounds: Normal heart sounds.  ?Pulmonary:  ?   Effort: Pulmonary effort is normal.  ?   Breath sounds: Normal breath sounds.  ?Abdominal:  ?   General: Bowel sounds are normal.  ?   Palpations: Abdomen is soft.  ?   Comments: Gravid abdomen  ?Musculoskeletal:     ?   General: Normal range of motion.  ?   Cervical back: Normal range of motion.  ?Skin: ?   General: Skin is warm and dry.  ?Neurological:  ?   Mental Status: She is alert and oriented to person, place, and time.  ? ? ?ED Results / Procedures / Treatments   ?Labs ?(all labs ordered  are listed, but only abnormal results are displayed) ?Labs Reviewed  ?CBC WITH DIFFERENTIAL/PLATELET - Abnormal; Notable for the following components:  ?    Result Value  ? WBC 13.3 (*)   ? RBC 3.11 (*)   ? Hemoglobin 9.9 (*)   ? HCT 29.4 (*)   ? Neutro Abs 10.7 (*)   ? Abs Immature Granulocytes 0.08 (*)   ? All other components within normal limits  ?BASIC METABOLIC PANEL - Abnormal; Notable for the following components:  ? Chloride 112 (*)   ? CO2 16 (*)   ? Glucose, Bld 132 (*)   ? Creatinine, Ser 1.10 (*)   ? Calcium 12.1 (*)   ? All other components within normal limits  ?URINALYSIS, ROUTINE W REFLEX MICROSCOPIC - Abnormal; Notable for the following components:  ? Color, Urine AMBER (*)   ? APPearance TURBID (*)   ? Glucose, UA 150 (*)   ? Hgb urine dipstick SMALL (*)   ? Ketones, ur 5 (*)   ? Protein, ur 30 (*)   ? Leukocytes,Ua LARGE (*)   ? WBC, UA >50 (*)   ? Bacteria, UA MANY (*)   ? All other components within normal limits  ?CBG MONITORING, ED - Abnormal; Notable for the following components:  ? Glucose-Capillary 127 (*)   ? All other components within normal limits  ?CBG MONITORING, ED - Abnormal; Notable for the following components:  ? Glucose-Capillary 30 (*)   ? All other components within normal limits  ?URINE CULTURE  ? ? ?EKG ?None ? ?Radiology ?No results found. ? ?Procedures ?Procedures  ? ? ?CRITICAL CARE ?Performed by: Larene Pickett ? ? ?Total critical care time: 35 minutes ? ?Critical care time was exclusive of separately billable procedures and treating other patients. ? ?Critical care was necessary to treat or prevent imminent or life-threatening deterioration. ? ?Critical care was time spent personally by me on the following activities: development of treatment plan with patient and/or surrogate as well as nursing, discussions with consultants, evaluation of patient's response to treatment, examination of patient, obtaining history from patient or surrogate, ordering and performing  treatments and interventions, ordering and review of laboratory studies, ordering and review of radiographic studies, pulse oximetry and re-evaluation of patient's condition. ? ? ?Medications Ordered in ED ?Medications - No data to display ? ?ED Course/ Medical Decision Making/ A&P ?  ?                        ?Medical Decision Making ?Amount and/or Complexity of Data Reviewed ?Labs: ordered. ? ?  Risk ?Prescription drug management. ?Decision regarding hospitalization. ? ? ?41 y.o. F approx [redacted] weeks gestation here with hypoglycemia.  Recently diagnosed with gestation diabetes and started on insulin.  Husband found her somewhat lethargic today and sugar was 43 upon EMS arrival.  Given D10 and sugar improved.  She is AAOx3 on arrival.  Still feeling fetal movement, denies vaginal bleeding/loss of fluids.  Has not checked her sugar today.  Will check labs, UA.  [facility] has evaluated, cleared from OB standpoint. ? ?Labs overall reassuring-- bicarb is low at 16..  Does appear to have UTI.  Given rocephin pending urine culture. ? ?12:44 AM ?Discussed with Dr. Harolyn Rutherford-- does not feel she needs admission.  Will have diabetic coordinator see her in the next day or so to adjust insulin regimen and re-check chemistry. ? ?2:02 AM ?Repeat cbg 30.  Patient remains awake, alert, talkative.  Given D50.  Likely will need to start D5 drip.  Unclear exactly how much insulin she took as patient could not tell me what her regimen she was supposed to be and did not check sugars today.  Also voices that she does not understand how to dose her insulin.  Recurrent hypoglycemia possibly from accidental OD on insulin vs from UTI. ? ?Discussed with OB-GYN again, they will admit for ongoing care and close monitoring. ? ?Final Clinical Impression(s) / ED Diagnoses ?Final diagnoses:  ?Hypoglycemia  ?Acute cystitis without hematuria  ? ? ?Rx / DC Orders ?ED Discharge Orders   ? ? None  ? ?  ? ? ?  ?Larene Pickett, PA-C ?09/12/21 0510 ? ?  ?Merrily Pew, MD ?09/12/21 7915 ? ?

## 2021-09-11 NOTE — ED Notes (Signed)
[facility] notified.  ?

## 2021-09-11 NOTE — Progress Notes (Signed)
[facility] notified at 2314 by [place] pt at 82w3darriving via EMS for hypoglycemic complaints. Pts spouse found her lethargic and called EMS. CBG in EMS was 43. Most recent CBG after treatment is 127 at 2305. FHR of 148 dopplered at 2325. Pt had a previous c/s. Pt denies Ucs, bleeding, and leaking fluid, aside from urine. Pt poor historian and confused. Dr. AHarolyn Rutherfordnotified at 2336 of pt. Pt OB cleared by Anyanwu. Pt educated on monitoring blood sugar at home. ED provider notified that pt is OB cleared. ?

## 2021-09-11 NOTE — ED Triage Notes (Signed)
Arrives EMS from home last seen normal this morning. Husband came home and found her laying down lethargic. EMS arrival cbg 43 with 25g D10 cbg increased 110. New onset diabetes 2 days ago. Approx [redacted] weeks pregnant.  ? ? ? ? ?

## 2021-09-12 ENCOUNTER — Encounter (HOSPITAL_COMMUNITY): Payer: Self-pay | Admitting: Obstetrics & Gynecology

## 2021-09-12 ENCOUNTER — Ambulatory Visit: Payer: Self-pay | Admitting: Surgery

## 2021-09-12 DIAGNOSIS — Z20822 Contact with and (suspected) exposure to covid-19: Secondary | ICD-10-CM | POA: Diagnosis present

## 2021-09-12 DIAGNOSIS — F314 Bipolar disorder, current episode depressed, severe, without psychotic features: Secondary | ICD-10-CM | POA: Diagnosis present

## 2021-09-12 DIAGNOSIS — F4310 Post-traumatic stress disorder, unspecified: Secondary | ICD-10-CM | POA: Diagnosis present

## 2021-09-12 DIAGNOSIS — N3000 Acute cystitis without hematuria: Secondary | ICD-10-CM | POA: Diagnosis present

## 2021-09-12 DIAGNOSIS — O99332 Smoking (tobacco) complicating pregnancy, second trimester: Secondary | ICD-10-CM | POA: Diagnosis present

## 2021-09-12 DIAGNOSIS — O0990 Supervision of high risk pregnancy, unspecified, unspecified trimester: Secondary | ICD-10-CM | POA: Diagnosis not present

## 2021-09-12 DIAGNOSIS — E213 Hyperparathyroidism, unspecified: Secondary | ICD-10-CM | POA: Diagnosis not present

## 2021-09-12 DIAGNOSIS — O99012 Anemia complicating pregnancy, second trimester: Secondary | ICD-10-CM | POA: Diagnosis present

## 2021-09-12 DIAGNOSIS — D509 Iron deficiency anemia, unspecified: Secondary | ICD-10-CM | POA: Diagnosis present

## 2021-09-12 DIAGNOSIS — Z3A25 25 weeks gestation of pregnancy: Secondary | ICD-10-CM

## 2021-09-12 DIAGNOSIS — O24912 Unspecified diabetes mellitus in pregnancy, second trimester: Secondary | ICD-10-CM | POA: Diagnosis not present

## 2021-09-12 DIAGNOSIS — O26832 Pregnancy related renal disease, second trimester: Secondary | ICD-10-CM | POA: Diagnosis not present

## 2021-09-12 DIAGNOSIS — E11649 Type 2 diabetes mellitus with hypoglycemia without coma: Secondary | ICD-10-CM

## 2021-09-12 DIAGNOSIS — O99282 Endocrine, nutritional and metabolic diseases complicating pregnancy, second trimester: Secondary | ICD-10-CM | POA: Diagnosis present

## 2021-09-12 DIAGNOSIS — O10012 Pre-existing essential hypertension complicating pregnancy, second trimester: Secondary | ICD-10-CM | POA: Diagnosis present

## 2021-09-12 DIAGNOSIS — E210 Primary hyperparathyroidism: Secondary | ICD-10-CM | POA: Diagnosis present

## 2021-09-12 DIAGNOSIS — O2312 Infections of bladder in pregnancy, second trimester: Secondary | ICD-10-CM | POA: Diagnosis present

## 2021-09-12 DIAGNOSIS — E039 Hypothyroidism, unspecified: Secondary | ICD-10-CM | POA: Diagnosis present

## 2021-09-12 DIAGNOSIS — F17210 Nicotine dependence, cigarettes, uncomplicated: Secondary | ICD-10-CM | POA: Diagnosis present

## 2021-09-12 DIAGNOSIS — O99342 Other mental disorders complicating pregnancy, second trimester: Secondary | ICD-10-CM | POA: Diagnosis present

## 2021-09-12 DIAGNOSIS — O2342 Unspecified infection of urinary tract in pregnancy, second trimester: Secondary | ICD-10-CM | POA: Diagnosis present

## 2021-09-12 DIAGNOSIS — O24419 Gestational diabetes mellitus in pregnancy, unspecified control: Secondary | ICD-10-CM | POA: Diagnosis not present

## 2021-09-12 DIAGNOSIS — N179 Acute kidney failure, unspecified: Secondary | ICD-10-CM | POA: Diagnosis not present

## 2021-09-12 DIAGNOSIS — D351 Benign neoplasm of parathyroid gland: Secondary | ICD-10-CM | POA: Diagnosis present

## 2021-09-12 DIAGNOSIS — O34219 Maternal care for unspecified type scar from previous cesarean delivery: Secondary | ICD-10-CM | POA: Diagnosis present

## 2021-09-12 DIAGNOSIS — O24414 Gestational diabetes mellitus in pregnancy, insulin controlled: Secondary | ICD-10-CM | POA: Diagnosis present

## 2021-09-12 LAB — URINALYSIS, ROUTINE W REFLEX MICROSCOPIC
Bilirubin Urine: NEGATIVE
Glucose, UA: 150 mg/dL — AB
Ketones, ur: 5 mg/dL — AB
Nitrite: NEGATIVE
Protein, ur: 30 mg/dL — AB
Specific Gravity, Urine: 1.013 (ref 1.005–1.030)
WBC, UA: >50 WBC/hpf — ABNORMAL HIGH (ref 0–5)
pH: 6 (ref 5.0–8.0)

## 2021-09-12 LAB — TYPE AND SCREEN
ABO/RH(D): O POS
Antibody Screen: NEGATIVE

## 2021-09-12 LAB — RESP PANEL BY RT-PCR (FLU A&B, COVID) ARPGX2
Influenza A by PCR: NEGATIVE
Influenza B by PCR: NEGATIVE
SARS Coronavirus 2 by RT PCR: NEGATIVE

## 2021-09-12 LAB — COMPREHENSIVE METABOLIC PANEL
ALT: 13 U/L (ref 0–44)
AST: 14 U/L — ABNORMAL LOW (ref 15–41)
Albumin: 2.2 g/dL — ABNORMAL LOW (ref 3.5–5.0)
Alkaline Phosphatase: 98 U/L (ref 38–126)
Anion gap: 5 (ref 5–15)
BUN: 5 mg/dL — ABNORMAL LOW (ref 6–20)
CO2: 19 mmol/L — ABNORMAL LOW (ref 22–32)
Calcium: 12 mg/dL — ABNORMAL HIGH (ref 8.9–10.3)
Chloride: 113 mmol/L — ABNORMAL HIGH (ref 98–111)
Creatinine, Ser: 1.07 mg/dL — ABNORMAL HIGH (ref 0.44–1.00)
GFR, Estimated: >60 mL/min (ref >60)
Glucose, Bld: 80 mg/dL (ref 70–99)
Potassium: 4.4 mmol/L (ref 3.5–5.1)
Sodium: 137 mmol/L (ref 135–145)
Total Bilirubin: <0.1 mg/dL — ABNORMAL LOW (ref 0.3–1.2)
Total Protein: 5.3 g/dL — ABNORMAL LOW (ref 6.5–8.1)

## 2021-09-12 LAB — CBC
HCT: 28.3 % — ABNORMAL LOW (ref 36.0–46.0)
Hemoglobin: 9.2 g/dL — ABNORMAL LOW (ref 12.0–15.0)
MCH: 31.5 pg (ref 26.0–34.0)
MCHC: 32.5 g/dL (ref 30.0–36.0)
MCV: 96.9 fL (ref 80.0–100.0)
Platelets: 254 10*3/uL (ref 150–400)
RBC: 2.92 MIL/uL — ABNORMAL LOW (ref 3.87–5.11)
RDW: 13.6 % (ref 11.5–15.5)
WBC: 12 10*3/uL — ABNORMAL HIGH (ref 4.0–10.5)
nRBC: 0 % (ref 0.0–0.2)

## 2021-09-12 LAB — GLUCOSE, CAPILLARY
Glucose-Capillary: 99 mg/dL (ref 70–99)
Glucose-Capillary: 120 mg/dL — ABNORMAL HIGH (ref 70–99)
Glucose-Capillary: 116 mg/dL — ABNORMAL HIGH (ref 70–99)
Glucose-Capillary: 107 mg/dL — ABNORMAL HIGH (ref 70–99)
Glucose-Capillary: 99 mg/dL (ref 70–99)

## 2021-09-12 LAB — CBG MONITORING, ED
Glucose-Capillary: 150 mg/dL — ABNORMAL HIGH (ref 70–99)
Glucose-Capillary: 30 mg/dL — CL (ref 70–99)
Glucose-Capillary: 75 mg/dL (ref 70–99)
Glucose-Capillary: 95 mg/dL (ref 70–99)

## 2021-09-12 MED ORDER — DOCUSATE SODIUM 100 MG PO CAPS: 100.0000 mg | ORAL_CAPSULE | Freq: Two times a day (BID) | ORAL | Status: DC | PRN

## 2021-09-12 MED ORDER — CALCIUM CARBONATE ANTACID 500 MG PO CHEW: 2.0000 | CHEWABLE_TABLET | ORAL | Status: DC | PRN

## 2021-09-12 MED ORDER — LEVOTHYROXINE SODIUM 25 MCG PO TABS
75.0000 ug | ORAL_TABLET | ORAL | Status: DC
  Administered 2021-09-12 – 2021-09-16 (×4): 75 ug via ORAL
  Filled 2021-09-12 (×6): qty 3

## 2021-09-12 MED ORDER — INSULIN ASPART 100 UNIT/ML IJ SOLN: 0.0000 [IU] | Freq: Four times a day (QID) | INTRAMUSCULAR | Status: DC

## 2021-09-12 MED ORDER — DEXTROSE-NACL 5-0.45 % IV SOLN
INTRAVENOUS | Status: DC
Start: 2021-09-12 — End: 2021-09-12

## 2021-09-12 MED ORDER — SODIUM CHLORIDE 0.9 % IV SOLN
1.0000 g | Freq: Once | INTRAVENOUS | Status: AC
  Administered 2021-09-12: 01:00:00 1 g via INTRAVENOUS
  Filled 2021-09-12: qty 10

## 2021-09-12 MED ORDER — DEXTROSE 50 % IV SOLN
25.0000 g | INTRAVENOUS | Status: AC
  Administered 2021-09-12: 04:00:00 25 g via INTRAVENOUS
  Filled 2021-09-12: qty 50

## 2021-09-12 MED ORDER — PRENATAL MULTIVITAMIN CH
1.0000 | ORAL_TABLET | Freq: Every day | ORAL | Status: DC
  Administered 2021-09-12: 12:00:00 1 via ORAL
  Filled 2021-09-12 (×2): qty 1

## 2021-09-12 MED ORDER — DEXTROSE 50 % IV SOLN
1.0000 | Freq: Once | INTRAVENOUS | Status: AC
  Administered 2021-09-12: 02:00:00 50 mL via INTRAVENOUS

## 2021-09-12 MED ORDER — CEFAZOLIN SODIUM-DEXTROSE 2-4 GM/100ML-% IV SOLN
2.0000 g | INTRAVENOUS | Status: DC
  Filled 2021-09-12: qty 100

## 2021-09-12 MED ORDER — SODIUM CHLORIDE 0.9 % IV SOLN: INTRAVENOUS | Status: DC

## 2021-09-12 MED ORDER — LEVOTHYROXINE SODIUM 25 MCG PO TABS: 75.0000 ug | ORAL_TABLET | Freq: Every day | ORAL | Status: DC

## 2021-09-12 MED ORDER — ZOLPIDEM TARTRATE 5 MG PO TABS: 5.0000 mg | ORAL_TABLET | Freq: Every evening | ORAL | Status: DC | PRN

## 2021-09-12 MED ORDER — DEXTROSE-NACL 5-0.45 % IV SOLN: INTRAVENOUS | Status: DC

## 2021-09-12 MED ORDER — SODIUM CHLORIDE 0.9 % IV SOLN: 250.0000 mL | INTRAVENOUS | Status: DC | PRN

## 2021-09-12 MED ORDER — DOCUSATE SODIUM 100 MG PO CAPS: 100.0000 mg | ORAL_CAPSULE | Freq: Every day | ORAL | Status: DC

## 2021-09-12 MED ORDER — SODIUM CHLORIDE 0.9 % IV SOLN
2.0000 g | INTRAVENOUS | Status: DC
  Filled 2021-09-12: qty 20

## 2021-09-12 MED ORDER — SODIUM CHLORIDE 0.9 % IV SOLN
1.0000 g | Freq: Once | INTRAVENOUS | Status: AC
  Administered 2021-09-12: 11:00:00 1 g via INTRAVENOUS
  Filled 2021-09-12 (×2): qty 10

## 2021-09-12 MED ORDER — SODIUM CHLORIDE 0.9% FLUSH
3.0000 mL | Freq: Two times a day (BID) | INTRAVENOUS | Status: DC
  Administered 2021-09-12: 04:00:00 3 mL via INTRAVENOUS

## 2021-09-12 MED ORDER — ASPIRIN EC 81 MG PO TBEC: 81.0000 mg | DELAYED_RELEASE_TABLET | Freq: Every day | ORAL | Status: DC

## 2021-09-12 MED ORDER — INSULIN ASPART 100 UNIT/ML IJ SOLN: 0.0000 [IU] | Freq: Three times a day (TID) | INTRAMUSCULAR | Status: DC

## 2021-09-12 MED ORDER — SODIUM CHLORIDE 0.9% FLUSH: 3.0000 mL | INTRAVENOUS | Status: DC | PRN

## 2021-09-12 MED ORDER — ACETAMINOPHEN 325 MG PO TABS
650.0000 mg | ORAL_TABLET | ORAL | Status: DC | PRN
  Administered 2021-09-12 – 2021-09-15 (×4): 650 mg via ORAL
  Filled 2021-09-12 (×4): qty 2

## 2021-09-12 NOTE — Progress Notes (Signed)
OB Note ?Will d/c dextrose IVF, GDM diet with AM fasting, 2 hour post prandial and qhs CBG checks and switch to NS MIVF. Gen surg to come see and I spoke to Psych and they will see her at some point during the admission to re-establish care ? ?Durene Romans MD ?Attending ?[facility] [employer]) ?GYN Consult Phone: [phone] (M-F, [phone]) & [phone] (Off hours, weekends, holidays) ? ?

## 2021-09-12 NOTE — H&P (Signed)
FACULTY PRACTICE ANTEPARTUM ADMISSION HISTORY AND PHYSICAL NOTE   History of Present Illness: Theresa Mccoy is a 41 y.o. [id number] at 35w4dadmitted for hypoglycemia in the setting of inadequately controlled GDM and active urinary tract infection.  [facility] At her last appointment on 09/04/2021, she reported elevated fasting  and postprandial CBGs in 170s -180s on Metformin 1000 mg po bid. As a result, she was started on a weight-dosed outpatient insulin therapy, after she declined admission for this reason. She was advised to take this insulin in lieu of the Metformin.   She was apparently doing well on this insulin regimen, but yesterday evening, she was discovered by her husband to be laying down and lethargic at home. EMS was called. On EMS arrival, her CBG was noted to be 43 and she was given D10 which increased her CBG to 110. Patient was unable to explain what her insulin regimen was or say how much insulin she took.  On arrival to ED, she was awake and alert, no nausea or vomiting, no other concerning symptoms. CBG was 127.   Initial labs showed UA concerning for UTI and she received Rocephin, also noted to have elevated Cr of 1.1 (on review of records was 1.05 three months ago, but most recently was 0.71 on 08/12/21).  Repeat CBG three hours later was noted to be 30, patient noted to remain alert, oriented and talkative.  She was given D50 which increased her CBG to 150, and the plan was made for admission given concern about patient's confusion about her insulin regimen, possible insulin overdose or hypoglycemic episodes precipitated by her UTI.    Patient reports the fetal movement as active. Patient reports uterine contraction  activity as none. Patient reports  vaginal bleeding as none. Patient describes fluid per vagina as none.   Patient Active Problem List   Diagnosis Date Noted   Hypoglycemia associated with gestational diabetes ([place] 09/12/2021    Hypoglycemia 09/12/2021   Acquired hypothyroidism 08/12/2021   Pelvic adhesive disease 08/05/2021   Chronic hypertension affecting pregnancy 08/05/2021   Hypothyroidism 07/19/2021   Gestational diabetes mellitus (GDM) affecting pregnancy, antepartum 07/10/2021   Supervision of high risk pregnancy, antepartum 06/21/2021   Previous cesarean delivery x 2 affecting pregnancy, antepartum 06/21/2021   S/P small bowel resection 06/21/2021   H/O unilateral salpingectomy 06/21/2021   Severe bipolar I disorder, current or most recent episode depressed ([place] 04/05/2020   Hypotension 01/22/2020   Adnexal mass 01/22/2020   Renal calculus, right 01/22/2020   Hypercalcemia 07/07/2019   Primary hyperparathyroidism ([place] 07/07/2019   UTI in pregnancy 07/02/2019   History of small bowel obstruction 07/02/2019   Muscle cramps 02/02/2017   Bipolar disorder, current episode mixed, moderate ([place] 02/02/2017   Generalized abdominal pain 02/02/2017   Generalized anxiety disorder 02/02/2017   Essential hypertension 02/02/2017    Past Medical History:  Diagnosis Date   AKI (acute kidney injury) ([place] 01/22/2020   Anemia    Depression    Diabetes mellitus without complication (HCC)    Hypernatremia 01/22/2020   Hyperthyroidism    PTSD (post-traumatic stress disorder)    SBO (small bowel obstruction) ([place] 07/07/2019   Sepsis ([place] 01/22/2020    Past Surgical History:  Procedure Laterality Date   BOWEL RESECTION N/A 07/08/2019   Procedure: Small Bowel Resection;  Surgeon: CClovis Riley MD;  [facility]  Service: General;  Laterality: N/A;   [place]  OF UTERUS     HAND SURGERY Left    LAPAROTOMY N/A 07/08/2019   Procedure: EXPLORATORY LAPAROTOMY;  Surgeon: Clovis Riley, MD;  Location[facility];  Service: General;  Laterality: N/A;   OOPHORECTOMY Right    SALPINGECTOMY      OB History  Gravida Para Term Preterm AB Living  _0 SAB IAB  Ectopic Multiple Live Births  _1 # Outcome Date GA Lbr Len/2nd Weight Sex Delivery Anes PTL Lv  12 Current           11 Ectopic 2005          10 Term 10/09/00 [redacted]w[redacted]d  M CS-LTranv EPI N LIV  9 Term 11/30/96 457w0d F CS-LTranv Gen  LIV     Complications: Failure to Progress in First Stage  8 SAB           7 SAB           6 SAB           5 SAB           4 SAB           3 SAB           2 SAB           1 SAB             Social History   Socioeconomic History   Marital status: Married    Spouse name: Not on file   Number of children: 2   Years of education: Not on file   Highest education level: GED or equivalent  Occupational History   Occupation: Unemployed  Tobacco Use   Smoking status: Some Days    Packs/day: 0.25    Types: Cigarettes   Smokeless tobacco: Never  Vaping Use   Vaping Use: Never used  Substance and Sexual Activity   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  She also stated that she is unemployed.   Social Determinants of Health   Financial Resource Strain: Not on file  Food Insecurity: Not on file  Transportation Needs: Not on file  Physical Activity: Not on file  Stress: Not on file  Social Connections: Not on file    Family History  Problem Relation Age of Onset   Diabetes Brother    Asthma Paternal Grandmother     Allergies  Allergen Reactions   Codeine Other (See Comments)    Constipation     No current facility-administered medications on file prior to encounter.   Current Outpatient Medications on File Prior to Encounter  Medication Sig Dispense Refill    Accu-Chek Softclix Lancets lancets Use four times daily as instructed. 100 each 12   aspirin EC 81 MG tablet Take 1 tablet (81 mg total) by mouth daily. Swallow whole. 30 tablet 11   Blood Pressure Monitoring (BLOOD PRESSURE MONITOR AUTOMAT) DEVI 1 Device by Does not apply route daily. Automatic  blood pressure cuff with regular cuff. Blood pressure to be monitored regularly at home. ICD-10 code: O09.90. 1 kit 0   Cholecalciferol (VITAMIN D3) 50 MCG (2000 UT) capsule Take 1 capsule (2,000 Units total) by mouth daily. 90 capsule 3   glucose blood test strip Use as instructed 100 each 12   insulin lispro (HUMALOG) 100 UNIT/ML injection 10 units before breakfast and before dinner 10 mL 11   insulin NPH Human (HUMULIN N) 100 UNIT/ML injection Inject 20 units before breakfast and 10 units at bedtime 10 mL 11   Insulin Syringes, Disposable, U-100 1 ML MISC Use as directed 60 each 2   levothyroxine (SYNTHROID) 75 MCG tablet Take 1 tablet (75 mcg total) by mouth daily before breakfast. 30 tablet 5   metFORMIN (GLUCOPHAGE) 1000 MG tablet Take 1 tablet (1,000 mg total) by mouth 2 (two) times daily with a meal. 60 tablet 5   polyethylene glycol (MIRALAX / GLYCOLAX) 17 g packet Take 17 g by mouth 2 (two) times daily. 60 each 2   Prenatal 27-1 MG TABS Take 1 tablet by mouth daily. (Patient not taking: Reported on 08/07/2021) 30 tablet 12     Review of Systems - Negative except what is mentioned in HPI  Vitals:  BP 98/66    Pulse 78    Temp 97.6 F (36.4 C) (Oral)    Resp 15    Ht 5' (1.524 m)    Wt 55.3 kg    LMP  (Approximate)    SpO2 100%    BMI 23.83 kg/m  Physical Examination: CONSTITUTIONAL: Well-developed, well-nourished female in no acute distress.  HENT:  Normocephalic, atraumatic, External right and left ear normal. Oropharynx is clear and moist EYES: Conjunctivae and EOM are normal. Pupils are equal, round, and reactive to light. No scleral icterus.  NECK: Normal range of motion, supple, no  masses SKIN: Skin is warm and dry. No rash noted. Not diaphoretic. No erythema. No pallor. NEUROLOGIC: Alert and oriented to person, place, and time. Normal reflexes, muscle tone coordination. No cranial nerve deficit noted. PSYCHIATRIC: Normal mood and affect. Normal behavior. Normal judgment and thought content. CARDIOVASCULAR: Normal heart rate noted, regular rhythm RESPIRATORY: Effort and breath sounds normal, no problems with respiration noted ABDOMEN: Soft, nontender, nondistended, gravid. MUSCULOSKELETAL: Normal range of motion. No edema and no tenderness. 2+ distal pulses.  Cervix: Not evaluated Membranes:intact Fetal Monitoring: FHR 148 bpm   Labs:  Results for orders placed or performed during the hospital encounter of 09/11/21 (from the past 24 hour(s))  CBG monitoring, ED   Collection Time: 09/11/21 11:05 PM  Result Value Ref Range   Glucose-Capillary 127 (H) 70 - 99 mg/dL  CBC with Differential   Collection Time: 09/11/21 11:10 PM  Result Value Ref Range   WBC 13.3 (H) 4.0 - 10.5 K/uL   RBC 3.11 (L) 3.87 - 5.11 MIL/uL   Hemoglobin 9.9 (L) 12.0 - 15.0 g/dL   HCT 29.4 (L) 36.0 - 46.0 %   MCV 94.5 80.0 - 100.0 fL   MCH 31.8 26.0 - 34.0 pg   MCHC 33.7 30.0 - 36.0 g/dL   RDW 13.6 11.5 - 15.5 %   Platelets 287 150 - 400 K/uL   nRBC 0.0 0.0 - 0.2 %   Neutrophils Relative % 80 %   Neutro Abs 10.7 (H) 1.7 - 7.7 K/uL   Lymphocytes Relative 13 %   Lymphs Abs 1.7 0.7 - 4.0 K/uL   Monocytes Relative 6 %  Monocytes Absolute 0.7 0.1 - 1.0 K/uL   Eosinophils Relative 0 %   Eosinophils Absolute 0.0 0.0 - 0.5 K/uL   Basophils Relative 0 %   Basophils Absolute 0.0 0.0 - 0.1 K/uL   Immature Granulocytes 1 %   Abs Immature Granulocytes 0.08 (H) 0.00 - 0.07 K/uL  Basic metabolic panel   Collection Time: 09/11/21 11:10 PM  Result Value Ref Range   Sodium 135 135 - 145 mmol/L   Potassium 3.7 3.5 - 5.1 mmol/L   Chloride 112 (H) 98 - 111 mmol/L   CO2 16 (L) 22 - 32 mmol/L    Glucose, Bld 132 (H) 70 - 99 mg/dL   BUN 6 6 - 20 mg/dL   Creatinine, Ser 1.10 (H) 0.44 - 1.00 mg/dL   Calcium 12.1 (H) 8.9 - 10.3 mg/dL   GFR, Estimated >60 >60 mL/min   Anion gap 7 5 - 15  Urinalysis, Routine w reflex microscopic Urine, Clean Catch   Collection Time: 09/11/21 11:10 PM  Result Value Ref Range   Color, Urine AMBER (A) YELLOW   APPearance TURBID (A) CLEAR   Specific Gravity, Urine 1.013 1.005 - 1.030   pH 6.0 5.0 - 8.0   Glucose, UA 150 (A) NEGATIVE mg/dL   Hgb urine dipstick SMALL (A) NEGATIVE   Bilirubin Urine NEGATIVE NEGATIVE   Ketones, ur 5 (A) NEGATIVE mg/dL   Protein, ur 30 (A) NEGATIVE mg/dL   Nitrite NEGATIVE NEGATIVE   Leukocytes,Ua LARGE (A) NEGATIVE   RBC / HPF 6-10 0 - 5 RBC/hpf   WBC, UA >50 (H) 0 - 5 WBC/hpf   Bacteria, UA MANY (A) NONE SEEN   Squamous Epithelial / LPF 6-10 0 - 5  CBG monitoring, ED   Collection Time: 09/12/21  1:55 AM  Result Value Ref Range   Glucose-Capillary 30 (LL) 70 - 99 mg/dL   Comment 1 Notify RN   CBG monitoring, ED   Collection Time: 09/12/21  2:16 AM  Result Value Ref Range   Glucose-Capillary 150 (H) 70 - 99 mg/dL    Imaging Studies: [place] MFM OB FOLLOW UP  Result Date: 09/04/2021 ----------------------------------------------------------------------  OBSTETRICS REPORT                       (Signed Final 09/04/2021 03:35 pm) ---------------------------------------------------------------------- Patient Info  ID #:       [id number]                          D.O.B.:  16-May-1981 (41 yrs)  Name:       [name]                  Visit Date: 09/04/2021 01:48 pm ---------------------------------------------------------------------- Performed By  Attending:        Sander Nephew      Secondary Phy.:   PEGGY                    MD                                                             CONSTANT MD  Performed By:     Germain Osgood            Address:  Faculty                    RDMS  Referred By:      [place]           Location[facility]                                [facility]                                                             [employer] [facility]                                                             [facility]  Ref. Address:     [address]                    [place] ---------------------------------------------------------------------- Orders  #  Description                           Code        Ordered By  1  [place] MFM OB FOLLOW UP                   [phone]    [place] ----------------------------------------------------------------------  #  Order #                     Accession #                Episode #  1  [id number]                   [id number]                 [id number] ---------------------------------------------------------------------- Indications  Gestational diabetes in pregnancy,             O24.415  controlled by oral hypoglycemic drugs  Hypothyroid (Synthyroid)                       O99.280 E03.9  Advanced maternal age multigravida 1+,        O43.522  second trimester (41 yo)  History of cesarean delivery, currently        O34.219  pregnant  Poor obstetric history-Recurrent (habitual)    O26.20  abortion (8 consecutive ab's)  [redacted] weeks gestation of pregnancy                Z3A.24 ---------------------------------------------------------------------- Fetal Evaluation  Num Of Fetuses:         1  Fetal Heart Rate(bpm):  147  Cardiac Activity:       Observed  Presentation:  Cephalic  Placenta:               Posterior  P. Cord Insertion:      Visualized, central  Amniotic Fluid  AFI FV:      Within normal limits                              Largest Pocket(cm)                              4.57 ---------------------------------------------------------------------- Biometry  BPD:      64.7  mm     G. Age:  26w 1d         92  %    CI:        76.02   %    70 - 86                                                           FL/HC:      18.5   %    18.7 - 20.9  HC:      235.2  mm     G. Age:  25w 4d         73  %    HC/AC:      1.22        1.05 - 1.21  AC:      192.1  mm     G. Age:  24w 0d         26  %    FL/BPD:     67.1   %    71 - 87  FL:       43.4  mm     G. Age:  24w 2d         31  %    FL/AC:      22.6   %    20 - 24  HUM:      41.1  mm     G. Age:  25w 0d         53  %  LV:        6.2  mm  Est. FW:     682  gm      1 lb 8 oz     35  % ---------------------------------------------------------------------- Gestational Age  U/S Today:     25w 0d                                        EDD:   12/18/21  Best:          24w 3d     Det. By:  Previous Ultrasound      EDD:   12/22/21                                      (05/26/21) ---------------------------------------------------------------------- Anatomy  Cranium:               Appears normal         LVOT:  Previously seen  Cavum:                 Appears normal         Aortic Arch:            Previously seen  Ventricles:            Appears normal         Ductal Arch:            Appears normal  Choroid Plexus:        Previously seen        Diaphragm:              Appears normal  Cerebellum:            Previously seen        Stomach:                Appears normal, left                                                                        sided  Posterior Fossa:       Previously seen        Abdomen:                Appears normal  Nuchal Fold:           Not applicable (>81    Abdominal Wall:         Previously seen                         wks GA)  Face:                  Orbits and profile     Cord Vessels:           Previously seen                         previously seen  Lips:                  Previously seen        Kidneys:                Appear normal  Palate:                Not well visualized    Bladder:                Appears normal  Thoracic:              Previously seen        Spine:                  Previously seen  Heart:                 Appears normal; EIF     Upper Extremities:      Appears normal  RVOT:                  Appears normal         Lower Extremities:      Previously seen  Other:  Fetus appears to be  a female. Nasal bone prev visualized. Technically          difficult due to fetal position. ---------------------------------------------------------------------- Cervix Uterus Adnexa  Cervix  Not visualized (advanced GA >24wks)  Uterus  No abnormality visualized.  Right Ovary  Not visualized.  Left Ovary  Not visualized.  Cul De Sac  No free fluid seen.  Adnexa  No adnexal mass visualized. ---------------------------------------------------------------------- Impression  Follow up growth due to A2GDM, AMA (41 yo) and  hyperparathyroidism.  Normal interval growth with measurements consistent with  dates  Good fetal movement and amniotic fluid volume  Ms. Vizcarrondo reports that her blood sugars are not well  controlled. She could not seem to gather her thoughts  regarding the management of her blood.  She is scheduled for surger for her hyperparathyroism. She is  being seen by endocrdinology. ---------------------------------------------------------------------- Recommendations  Repeat growth in 4 weeks.  Initiate weekly testing at 32 weeks. ----------------------------------------------------------------------               Sander Nephew, MD Electronically Signed Final Report   09/04/2021 03:35 pm ----------------------------------------------------------------------    Assessment and Plan: Principal Problem:   Hypoglycemia associated with gestational diabetes [facility]) Active Problems:   UTI in pregnancy   Primary hyperparathyroidism ([place])   Severe bipolar I disorder, current or most recent episode depressed ([place])   Supervision of high risk pregnancy, antepartum   Previous cesarean delivery x 2 affecting pregnancy, antepartum   Gestational diabetes mellitus (GDM) affecting pregnancy, antepartum   Hypothyroidism   Chronic hypertension affecting pregnancy    Hypoglycemia   Admit to Antenatal Hypoglycemia protocol initiated, D5 1/2 NS infusion started at 75 ml/hr Check CBGs every two hours for now, can decrease frequency once stabilized Will hold any standing hypoglycemics for now DM coordinator consulted to help determine regimen for patient. Continue Rocephin for UTI, follow up urine culture Recheck Cr in the morning and manage accordingly Continue Synthroid 75 mcg for hypothyroidism Needs surgical consult for parathyroidectomy, patient has appointment with General Surgery on 09/16/2021 but has transportation issues. May consider doing consultation while patient is here in house.  Fetal heart rate monitoring twice a day, no current PTL concerns Routine antenatal care    Verita Schneiders, MD, [place], [facility] for [employer], [place]

## 2021-09-12 NOTE — Progress Notes (Signed)
Nutrition: ? ?Consult to reinforce Diabetic Diet ? ?Pt has participated in outpt education on 1/17 and 08/06/21 ? ?It is my impression that there may be limited resources available for food purchase. Pt seems to have difficulty comprehending basic concepts and is observed to have impaired ability to see. She had difficulty reading the menu and selecting options for lunch. ? ?I stressed to the patient to try to consume 3 meals and a bedtime snack each day. Discussed what a balanced meal was and gave examples. Talked about high CHO foods. Discussed what she would be able to purchase and how she is able to prepare it.  ? ?I am not confident that this will translate to practice at home ?

## 2021-09-12 NOTE — Consult Note (Signed)
Consult Note  Theresa Mccoy June 06, 1981  [id number].    Requesting MD: Dr. Ilda Basset Chief Complaint/Reason for Consult: primary hyperparathyroidism, parathyroidectomy  HPI:  41 y.o. [id number] approx [redacted] weeks gestation who presented to the ED on 09/11/2021 due to hypoglycemia.  She has known history of gestational diabetes diagnosed approximately 1 week ago and started on insulin.  On date of presentation her husband arrived home to find her lying down and seemingly lethargic.  He called EMS with [facility] upon their arrival and she was transported to the ED for further evaluation and treatment.  Work-up in the ED was concerning for UTI for which she was given Rocephin, elevated creatinine to 1.1.  She was given D50 for treatment of hypoglycemia with appropriate response and admitted to the obstetrics/gynecology service for further evaluation and treatment.  She has hx hypothyroidism for which she is on Synthroid and was to be evaluated by general surgery on 09/16/2021 for hyperparathyroidism/parathyroidectomy. She has missed prior appointments 2/2 transportation issues and general surgery consulted for consideration of intervention during admission.  She has been evaluated by endocrinology Dr. Kelton Pillar on 08/12/2021 after diagnosis of hypercalcemia and reported symptoms of constipation, polyuria, polydipsia.  At that time endocrinology recommended proceeding with parathyroidectomy during the second trimester for primary hyperparathyroidism.  She has had a NM parathyroid scan in 2021 which shows right inferior parathyroid adenoma.  Thyroid ultrasound from 2/6 shows 7 mm hypoechoic structure located posterior to the inferior aspect of the right thyroid lobe suspicious for parathyroid adenoma.  At this time she reports no further lethargy.  She is not nauseous or throwing up.  ROS otherwise as below  She has an allergy to codeine - constipation. She is not on anticoagulation.  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 Kae Heller in 2021. No prior neck surgery.   ROS: Review of Systems  Constitutional:  Negative for chills and fever.  Respiratory:  Negative for cough, shortness of breath and wheezing.   Cardiovascular:  Negative for chest pain and palpitations.  Gastrointestinal:  Negative for abdominal pain, nausea and vomiting.  Neurological:  Positive for headaches.  All other systems reviewed and are negative.  Family History  Problem Relation Age of Onset   Diabetes Brother    Asthma Paternal Grandmother     Past Medical History:  Diagnosis Date   AKI (acute kidney injury) ([facility]) 01/22/2020   Anemia    Depression    Diabetes mellitus without complication (HCC)    Hypernatremia 01/22/2020   Hyperthyroidism    PTSD (post-traumatic stress disorder)    SBO (small bowel obstruction) ([facility]) 07/07/2019   Sepsis ([facility]) 01/22/2020    Past Surgical History:  Procedure Laterality Date   BOWEL RESECTION N/A 07/08/2019   Procedure: Small Bowel Resection;  Surgeon: Clovis Riley, MD;  Location: [facility];  Service: General;  Laterality: N/A;   CESAREAN SECTION     DILATION AND CURETTAGE OF UTERUS     HAND SURGERY Left    LAPAROTOMY N/A 07/08/2019   Procedure: EXPLORATORY LAPAROTOMY;  Surgeon: Clovis Riley, MD;  Location: [facility];  Service: General;  Laterality: N/A;   OOPHORECTOMY Right    SALPINGECTOMY      Social History:  reports that she has been smoking cigarettes. She has been smoking an average of .25 packs per day. She has never used smokeless tobacco. She reports that she does not currently  use drugs after having used the following drugs: Marijuana. She reports that she does not drink alcohol.  Allergies:  Allergies  Allergen Reactions   Codeine Other (See Comments)    Constipation     Medications Prior to Admission  Medication Sig Dispense Refill   Accu-Chek Softclix Lancets lancets Use four  times daily as instructed. 100 each 12   aspirin EC 81 MG tablet Take 1 tablet (81 mg total) by mouth daily. Swallow whole. 30 tablet 11   Blood Pressure Monitoring (BLOOD PRESSURE MONITOR AUTOMAT) DEVI 1 Device by Does not apply route daily. Automatic blood pressure cuff with regular cuff. Blood pressure to be monitored regularly at home. ICD-10 code: O09.90. 1 kit 0   Cholecalciferol (VITAMIN D3) 50 MCG (2000 UT) capsule Take 1 capsule (2,000 Units total) by mouth daily. 90 capsule 3   glucose blood test strip Use as instructed 100 each 12   insulin lispro (HUMALOG) 100 UNIT/ML injection 10 units before breakfast and before dinner 10 mL 11   insulin NPH Human (HUMULIN N) 100 UNIT/ML injection Inject 20 units before breakfast and 10 units at bedtime 10 mL 11   Insulin Syringes, Disposable, U-100 1 ML MISC Use as directed 60 each 2   levothyroxine (SYNTHROID) 75 MCG tablet Take 1 tablet (75 mcg total) by mouth daily before breakfast. 30 tablet 5   metFORMIN (GLUCOPHAGE) 1000 MG tablet Take 1 tablet (1,000 mg total) by mouth 2 (two) times daily with a meal. 60 tablet 5   polyethylene glycol (MIRALAX / GLYCOLAX) 17 g packet Take 17 g by mouth 2 (two) times daily. 60 each 2   Prenatal 27-1 MG TABS Take 1 tablet by mouth daily. (Patient not taking: Reported on 08/07/2021) 30 tablet 12    Blood pressure 104/64, pulse 86, temperature 98 F (36.7 C), temperature source Oral, resp. rate 18, height 5' (1.524 m), weight 55.3 kg, SpO2 100 %. Physical Exam: General: pleasant, WD, female who is laying in bed in NAD HEENT: head is normocephalic, atraumatic.  Sclera are noninjected.  Pupils equal and round. EOMs intact.  Ears and nose without any masses or lesions.  Mouth is pink and moist Neck: No obvious thyromegaly or palpable nodule.  Trachea midline.  No stridor. Heart: regular, rate, and rhythm.  Normal s1,s2. No obvious murmurs, gallops, or rubs noted.  Palpable radial and pedal pulses bilaterally Lungs:  CTAB, no wheezes, rhonchi, or rales noted.  Respiratory effort nonlabored Abd: soft, gravid abdomen that is NT, ND, +BS, no masses, hernias, or organomegaly. Prior midline scar well healed.  MSK: all 4 extremities are symmetrical with no cyanosis, clubbing, or edema. Skin: warm and dry with no masses, lesions, or rashes Neuro: Cranial nerves 2-12 grossly intact, sensation is normal throughout Psych: A&Ox3 with an appropriate affect.    Results for orders placed or performed during the hospital encounter of 09/11/21 (from the past 48 hour(s))  CBG monitoring, ED     Status: Abnormal   Collection Time: 09/11/21 11:05 PM  Result Value Ref Range   Glucose-Capillary 127 (H) 70 - 99 mg/dL    Comment: Glucose reference range applies only to samples taken after fasting for at least 8 hours.  CBC with Differential     Status: Abnormal   Collection Time: 09/11/21 11:10 PM  Result Value Ref Range   WBC 13.3 (H) 4.0 - 10.5 K/uL   RBC 3.11 (L) 3.87 - 5.11 MIL/uL   Hemoglobin 9.9 (L) 12.0 - 15.0 g/dL  HCT 29.4 (L) 36.0 - 46.0 %   MCV 94.5 80.0 - 100.0 fL   MCH 31.8 26.0 - 34.0 pg   MCHC 33.7 30.0 - 36.0 g/dL   RDW 13.6 11.5 - 15.5 %   Platelets 287 150 - 400 K/uL   nRBC 0.0 0.0 - 0.2 %   Neutrophils Relative % 80 %   Neutro Abs 10.7 (H) 1.7 - 7.7 K/uL   Lymphocytes Relative 13 %   Lymphs Abs 1.7 0.7 - 4.0 K/uL   Monocytes Relative 6 %   Monocytes Absolute 0.7 0.1 - 1.0 K/uL   Eosinophils Relative 0 %   Eosinophils Absolute 0.0 0.0 - 0.5 K/uL   Basophils Relative 0 %   Basophils Absolute 0.0 0.0 - 0.1 K/uL   Immature Granulocytes 1 %   Abs Immature Granulocytes 0.08 (H) 0.00 - 0.07 K/uL    Comment: Performed at [facility] [address], [place], [place] [address]  Basic metabolic panel     Status: Abnormal   Collection Time: 09/11/21 11:10 PM  Result Value Ref Range   Sodium 135 135 - 145 mmol/L   Potassium 3.7 3.5 - 5.1 mmol/L   Chloride 112 (H) 98 - 111 mmol/L   CO2 16 (L)  22 - 32 mmol/L   Glucose, Bld 132 (H) 70 - 99 mg/dL    Comment: Glucose reference range applies only to samples taken after fasting for at least 8 hours.   BUN 6 6 - 20 mg/dL   Creatinine, Ser 1.10 (H) 0.44 - 1.00 mg/dL   Calcium 12.1 (H) 8.9 - 10.3 mg/dL   GFR, Estimated >60 >60 mL/min    Comment: (NOTE) Calculated using the CKD-EPI Creatinine Equation (2021)    Anion gap 7 5 - 15    Comment: Performed at [facility] [address], [place], [place] [address]  Urinalysis, Routine w reflex microscopic Urine, Clean Catch     Status: Abnormal   Collection Time: 09/11/21 11:10 PM  Result Value Ref Range   Color, Urine AMBER (A) YELLOW    Comment: BIOCHEMICALS MAY BE AFFECTED BY COLOR   APPearance TURBID (A) CLEAR   Specific Gravity, Urine 1.013 1.005 - 1.030   pH 6.0 5.0 - 8.0   Glucose, UA 150 (A) NEGATIVE mg/dL   Hgb urine dipstick SMALL (A) NEGATIVE   Bilirubin Urine NEGATIVE NEGATIVE   Ketones, ur 5 (A) NEGATIVE mg/dL   Protein, ur 30 (A) NEGATIVE mg/dL   Nitrite NEGATIVE NEGATIVE   Leukocytes,Ua LARGE (A) NEGATIVE   RBC / HPF 6-10 0 - 5 RBC/hpf   WBC, UA >50 (H) 0 - 5 WBC/hpf   Bacteria, UA MANY (A) NONE SEEN   Squamous Epithelial / LPF 6-10 0 - 5    Comment: Performed at [facility], [address], [place], [place] [address]  CBG monitoring, ED     Status: Abnormal   Collection Time: 09/12/21  1:55 AM  Result Value Ref Range   Glucose-Capillary 30 (LL) 70 - 99 mg/dL    Comment: Glucose reference range applies only to samples taken after fasting for at least 8 hours.   Comment 1 Notify RN   CBG monitoring, ED     Status: Abnormal   Collection Time: 09/12/21  2:16 AM  Result Value Ref Range   Glucose-Capillary 150 (H) 70 - 99 mg/dL    Comment: Glucose reference range applies only to samples taken after fasting for at  least 8 hours.  Resp Panel by RT-PCR (Flu A&B, Covid) Nasopharyngeal Swab     Status: None   Collection Time: 09/12/21  2:25 AM   Specimen:  Nasopharyngeal Swab; Nasopharyngeal(NP) swabs in vial transport medium  Result Value Ref Range   SARS Coronavirus 2 by RT PCR NEGATIVE NEGATIVE    Comment: (NOTE) SARS-CoV-2 target nucleic acids are NOT DETECTED.  The SARS-CoV-2 RNA is generally detectable in upper respiratory specimens during the acute phase of infection. The lowest concentration of SARS-CoV-2 viral copies this assay can detect is 138 copies/mL. A negative result does not preclude SARS-Cov-2 infection and should not be used as the sole basis for treatment or other patient management decisions. A negative result may occur with  improper specimen collection/handling, submission of specimen other than nasopharyngeal swab, presence of viral mutation(s) within the areas targeted by this assay, and inadequate number of viral copies(<138 copies/mL). A negative result must be combined with clinical observations, patient history, and epidemiological information. The expected result is Negative.  Fact Sheet for Patients:  EntrepreneurPulse.com.au  Fact Sheet for Healthcare Providers:  IncredibleEmployment.be  This test is no t yet approved or cleared by the [place] FDA and  has been authorized for detection and/or diagnosis of SARS-CoV-2 by FDA under an Emergency Use Authorization (EUA). This EUA will remain  in effect (meaning this test can be used) for the duration of the COVID-19 declaration under Section 564(b)(1) of the Act, 21 U.S.C.section 360bbb-3(b)(1), unless the authorization is terminated  or revoked sooner.       Influenza A by PCR NEGATIVE NEGATIVE   Influenza B by PCR NEGATIVE NEGATIVE    Comment: (NOTE) The Xpert Xpress SARS-CoV-2/FLU/RSV plus assay is intended as an aid in the diagnosis of influenza from Nasopharyngeal swab specimens and should not be used as a sole basis for treatment. Nasal washings and aspirates are unacceptable for Xpert Xpress  SARS-CoV-2/FLU/RSV testing.  Fact Sheet for Patients: EntrepreneurPulse.com.au  Fact Sheet for Healthcare Providers: IncredibleEmployment.be  This test is not yet approved or cleared by the [place] FDA and has been authorized for detection and/or diagnosis of SARS-CoV-2 by FDA under an Emergency Use Authorization (EUA). This EUA will remain in effect (meaning this test can be used) for the duration of the COVID-19 declaration under Section 564(b)(1) of the Act, 21 U.S.C. section 360bbb-3(b)(1), unless the authorization is terminated or revoked.  Performed at [facility], [place] [address] [place], [place] [address]   Type and screen [place]     Status: None   Collection Time: 09/12/21  3:47 AM  Result Value Ref Range   ABO/RH(D) O POS    Antibody Screen NEG    Sample Expiration      09/15/2021,2359 Performed at [facility], [place] [address], [place], [place] [address]   CBG monitoring, ED     Status: None   Collection Time: 09/12/21  3:50 AM  Result Value Ref Range   Glucose-Capillary 75 70 - 99 mg/dL    Comment: Glucose reference range applies only to samples taken after fasting for at least 8 hours.  CBC on admission     Status: Abnormal   Collection Time: 09/12/21  4:43 AM  Result Value Ref Range   WBC 12.0 (H) 4.0 - 10.5 K/uL   RBC 2.92 (L) 3.87 - 5.11 MIL/uL   Hemoglobin 9.2 (L) 12.0 - 15.0 g/dL   HCT 28.3 (L) 36.0 - 46.0 %  MCV 96.9 80.0 - 100.0 fL   MCH 31.5 26.0 - 34.0 pg   MCHC 32.5 30.0 - 36.0 g/dL   RDW 13.6 11.5 - 15.5 %   Platelets 254 150 - 400 K/uL   nRBC 0.0 0.0 - 0.2 %    Comment: Performed at [facility] [address], [place], [place] [address]  Comprehensive metabolic panel     Status: Abnormal   Collection Time: 09/12/21  4:43 AM  Result Value Ref Range   Sodium 137 135 - 145 mmol/L   Potassium 4.4 3.5 - 5.1 mmol/L   Chloride 113 (H) 98 - 111 mmol/L   CO2 19 (L)  22 - 32 mmol/L   Glucose, Bld 80 70 - 99 mg/dL    Comment: Glucose reference range applies only to samples taken after fasting for at least 8 hours.   BUN 5 (L) 6 - 20 mg/dL   Creatinine, Ser 1.07 (H) 0.44 - 1.00 mg/dL   Calcium 12.0 (H) 8.9 - 10.3 mg/dL   Total Protein 5.3 (L) 6.5 - 8.1 g/dL   Albumin 2.2 (L) 3.5 - 5.0 g/dL   AST 14 (L) 15 - 41 U/L   ALT 13 0 - 44 U/L   Alkaline Phosphatase 98 38 - 126 U/L   Total Bilirubin <0.1 (L) 0.3 - 1.2 mg/dL    Comment: REPEATED TO VERIFY   GFR, Estimated >60 >60 mL/min    Comment: (NOTE) Calculated using the CKD-EPI Creatinine Equation (2021)    Anion gap 5 5 - 15    Comment: Performed at [facility], [place] [address], [place], [place] [address]  CBG monitoring, ED     Status: None   Collection Time: 09/12/21  6:08 AM  Result Value Ref Range   Glucose-Capillary 95 70 - 99 mg/dL    Comment: Glucose reference range applies only to samples taken after fasting for at least 8 hours.  Glucose, capillary     Status: None   Collection Time: 09/12/21  8:01 AM  Result Value Ref Range   Glucose-Capillary 99 70 - 99 mg/dL    Comment: Glucose reference range applies only to samples taken after fasting for at least 8 hours.  Glucose, capillary     Status: Abnormal   Collection Time: 09/12/21  9:55 AM  Result Value Ref Range   Glucose-Capillary 120 (H) 70 - 99 mg/dL    Comment: Glucose reference range applies only to samples taken after fasting for at least 8 hours.  Glucose, capillary     Status: Abnormal   Collection Time: 09/12/21 11:20 AM  Result Value Ref Range   Glucose-Capillary 116 (H) 70 - 99 mg/dL    Comment: Glucose reference range applies only to samples taken after fasting for at least 8 hours.   No results found.  Anti-infectives (From admission, onward)    Start     Dose/Rate Route Frequency Ordered Stop   09/13/21 1000  cefTRIAXone (ROCEPHIN) 2 g in sodium chloride 0.9 % 100 mL IVPB       See Hyperspace for full Linked  Orders Report.   2 g 200 mL/hr over 30 Minutes Intravenous Every 24 hours 09/12/21 0238     09/12/21 1000  cefTRIAXone (ROCEPHIN) 1 g in sodium chloride 0.9 % 100 mL IVPB       See Hyperspace for full Linked Orders Report.   1 g 200 mL/hr over 30 Minutes Intravenous  Once 09/12/21 0238 09/12/21 1104  09/12/21 0045  cefTRIAXone (ROCEPHIN) 1 g in sodium chloride 0.9 % 100 mL IVPB        1 g 200 mL/hr over 30 Minutes Intravenous  Once 09/12/21 0040 09/12/21 0144        Assessment/Plan Primary hyperparathyroidism - Agree with admission and treatment of UTI, hypoglycemia as per primary team - Due to patient's transportation issues and OB preference for surgical treatment during second trimester I discussed the case with Dr. Harlow Asa who is currently covering the [facility] Acute Gen Surg service.  He is graciously agreed to come evaluate the patient and tentatively plan for right inferior parathyroidectomy tomorrow afternoon.  I discussed plan for surgery tomorrow with the patient.  We discussed risks of surgery including but not limited to anesthesia (MI, CVA, death), bleeding, infection, pain, injury to surrounding structures, hoarseness, postoperative hematoma, and increased risk to baby. Dr. Harlow Asa to discuss with patient further. I have asked Dr. Ilda Basset of OB to discuss fetal risks of anesthesia/surgery with the patient in more detail as well. They are also arranging for monitoring of baby during the procedure.   FEN: CM, NPO MN  ID: rocephin (UTI) VTE: none currently  Per primary: Hypothyroidism UTI Gestational diabetes mellitus Chronic hypertension Hypoglycemia [id number] at [redacted] weeks gestation  I reviewed nursing notes, ED provider notes, Consultant (Endocrine, OB) notes, last 24 h vitals, intake and output, last 24 h labs and trends, and NM parathyroid and US thyroid report.  Alferd Apa, [facility] Surgery 09/12/2021, 12:05 PM Please see Amion for pager number during day  hours 7:00am-4:30pm

## 2021-09-12 NOTE — Progress Notes (Signed)
Inpatient Diabetes Program Recommendations ? ?Diabetes Treatment Program Recommendations ? ?ADA Standards of Care ?Diabetes in Pregnancy Target Glucose Ranges: ? ?Fasting: 70 - 95 mg/dL ?1 hr postprandial: Less than '140mg'$ /dL (from first bite of meal) ?2 hr postprandial: Less than 120 mg/dL (from first bite of meal)   ? ? ?Lab Results  ?Component Value Date  ? GLUCAP 95 09/12/2021  ? HGBA1C 5.7 (H) 06/21/2021  ? ? ?Review of Glycemic Control ? Latest Reference Range & Units 09/12/21 01:55 09/12/21 02:16 09/12/21 03:50  ?Glucose-Capillary 70 - 99 mg/dL 30 (LL) 150 (H) 75  ?(LL): Data is critically low ?(H): Data is abnormally high ?Diabetes history: GDM ?Outpatient Diabetes medications: NPH 20 units QA, 10 units QP, Humalog 10 units with breakfast/dinner ?Current orders for Inpatient glycemic control: none ?D5% @ 75 ml/hr ?Inpatient Diabetes Program Recommendations:   ? ?Noted hypoglycemia and patient took Humalog without eating. Will reach out to patient.  ?Spoke with patient at length regarding outpatient management for diabetes. Recently diagnosed with GDM and was started on insulin per vial and syringe. Sees [facility] outpatient endocrinology, however was prescribed insulin by OB. Patient is slow to answer questions, however response are appropriate. Unclear of patient's baseline. Husband performs all injections. ?Reviewed patient's current A1c of 5.7% Explained what a A1c is and what it measures. Also reviewed goal A1c with patient, importance of good glucose control @ home, and blood sugar goals. Reviewed patho of DM, especially in pregnancy, target goals, signs and symptoms of hyper vs hypo glycemia, interventions, hyperinsulinemia in the neonate vs hypoglycemia, impact of infection to glucose trends and insulin needs, differences between long acting vs short acting insulin, survival skills, importance of eating regular meals, vascular changes and commorbidities. ?Patient reports checking blood sugars as  instructed for pregnancy and that values were ranging between 115-160's mg/dL. Sometimes fastings are elevated as patient reports needing snacks in the middle of the night because "the baby is hungry". Encouraged to check in the middle of night if that feeling persists; however denies symptoms of shakiness, dizziness, sweating or poor sleep. Reviewed when to call MD.  ?Secure chat sent to [employer] to reinforce concepts discussed, orders and question patient using verbal teach back.  ? ?If planning to discontinue D5% could add Novolog 0-14 units TID following discontinuation.  ?Question If reported fastings are accurate? Also, question if short acting with meals is too much even if patient consumes meals?  ? ?Placed outpatient education and inpatient dietitian as well.  ? ?Following.  ? ?Thanks, ?Bronson Curb, MSN, RNC-OB ?Diabetes Coordinator ?[phone] (8a-5p) ? ? ? ?

## 2021-09-12 NOTE — Progress Notes (Signed)
OB Note ?09/12/2021 ?Time: 6116 ? ?I spoke to the patient re: the surgery tomorrow and any potential anesthesia. I told her that any anesthesia used (general, local, block) is fine for pregnancy and we will monitor her pre and post procedure, but no OB complications anticipated ? ?Durene Romans MD ?Attending ?[facility] for [employer] [facility]) ?GYN Consult Phone: [phone] (M-F, [phone]) & [phone] (Off hours, weekends, holidays) ? ? ?

## 2021-09-12 NOTE — Consult Note (Signed)
[facility] New Face-to-Face Psychiatric Evaluation   Service Date: September 12, 2021 LOS:  LOS: 0 days   Portions of interview conducted by Spero Curb MS-III under my direct supervision.   Assessment  Theresa Mccoy is a 41 y.o. female admitted medically for 09/11/2021 10:56 PM for hypoglycemia in pregnancy. She carries the psychiatric diagnoses of alleged bipolar disorder and PTSD and has a past medical history of  hyperthyroidism and hyperparathyroidism.Psychiatry was consulted for "severe bipolar disorder" by Aletha Halim, MD.    Patient was seen in the afternoon. Overall, she was a poor historian and often had to ask questions multiple times in different ways to get an answer. It is difficult to establish a narrative history of her illness however appears to have been diagnosed with bipolar disorder at the age of 67 due to significant irritability. She endorses some symptoms consistent with hypomanic episodes (1-2 days of poor sleep with irritability) however did not endorse any history of psychiatric hospitalizations or a history of full manic episodes; this is consistent with prior psychiatric notes that have been reviewed. Did endorse recent auditory hallucinations and appeared to have some mild thought disorganization and parsed phrases oddly throughout exam. She has a history of pseudocyesis with multiple emergency room visits for this reason (is actually pregnant at time of exam). Will need to gather more collateral from husband; overall picture appears to be more consistent with psychotic spectrum disorder (or bipolar II vs MDD with psychotic features/delusional disorder) than true bipolar I disorder. She did have a psychiatric hospitalization in 2020 (denied any psych hospitalizations on exam) in which she was diagnosed with MDD with psychotic features which is generally consistent with above assessment. She was discharged from that hospitalization on a risperidone LAI with  noted improvement in outbursts and irritability. She endorses an history of trauma as a child and suffers from hypervigilance, nightmares, and flashbacks; has previously been diagnosed with PTSD which I generally agree with. She also has both hyperthyroidism and hyperparathyroidism which will worsen underlying psychiatric disorders (psych hx does appear to predate endocrine abnormalities).  Patient is open to taking psychotropic medications during pregnancy "if they are safe for her baby". Discussed this topic generally with pt; will hold off on medications for now (building alliance, pt undergoing surgery tomorrow for parathyroid removal, hx prolonged qtc and undergoing anesthesia). Regardless of underlying diagnosis, both pt and review of records suggest she has done well on risperidone and will likely suggest this as monotherapy to minimize polypharmacy and reduce risk of harm to baby.   Diagnoses:  Active Hospital problems: Principal Problem:   Hypoglycemia associated with gestational diabetes [facility]) Active Problems:   UTI in pregnancy   Primary hyperparathyroidism ([place])   Severe bipolar I disorder, current or most recent episode depressed ([place])   Supervision of high risk pregnancy, antepartum   Previous cesarean delivery x 2 affecting pregnancy, antepartum   Gestational diabetes mellitus (GDM) affecting pregnancy, antepartum   Hypothyroidism   Chronic hypertension affecting pregnancy   Anemia in pregnancy, second trimester     Plan  ## Safety and Observation Level:  - Based on my clinical evaluation, I estimate the patient to be at low risk of self harm in the current setting - At this time, we recommend a routine level of observation. This decision is based on my review of the chart including patient's history and current presentation, interview of the patient, mental status examination, and consideration of suicide risk including evaluating suicidal ideation, plan,  intent, suicidal or  self-harm behaviors, risk factors, and protective factors. This judgment is based on our ability to directly address suicide risk, implement suicide prevention strategies and develop a safety plan while the patient is in the clinical setting. Please contact our team if there is a concern that risk level has changed.   ## Medications:  -- defer  ## Medical Decision Making Capacity:  Not formally assessed  ## Further Work-up:  -- consider B12 -- most recent EKG on 09/12/20 had QtC of   ## Disposition:  -- Does not appear to require psychiatric hospitalization when medically cleared    Thank you for this consult request. Recommendations have been communicated to the primary team.  We will continue to follow at this time.   [place] A Clark Cuff   NEW history  Relevant Aspects of Hospital Course:  Admitted on 09/11/2021 for hypoglycemia; new dx diabetes with this pregnancy.  Patient Report:   ROS:  Patient is awoken from a nap in the late afternoon. She is alert and fully oriented.  States she is here because she made a mistake and didn't eat after she took her medication. Denies that this was due to difficulties affording food. Has seen psychiatry before because "I have a rough history that has affected me in a lot of ways". It is affecting her so much that she can't really work. She lives with her husband - he is treating her well and helps her take her medication. She is currently taking insulin. She is not taking any psychotropic medications due to her pregnancy. She last took psychotropic medications in 2022. She was most recently on risperdone and "another r word" (posibly trazodone?)  Spends her days sleeping to fill time - able to get up to do everything she wants to. Has some difficulty at night. Has had periods of a couple of days with some poor sleep, racing thoughts, and some irritability.   Pt has never been inside of a psychiatric hospital (this is untrue, hospitalized in  10/2018) but feels like she "needs to go" because she is rude when she decompensates.  She will yell but not fight unless someone hits her.   Diagnosed with bipolar disorder at age 50 - states when she gets frustrated with people at that age she would be rude or mean.   Dx with hypercalcemia in 2017.   Sometimes feels down, depressed, like she has the blues. Has feelings like that every time she gets told that one of her family passes. Does not feel like she has been depressed this pregnancy. 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   Patient has has hallucinations in the past in 2020. She heard people talking in the background but not directly to her.   Tries to avoid worrying about things that she can't change - did not appear to meet criteria for GAD.  Minimizes anxiety sx to most people.   Has had panic attacks before but ended up in the hospital. Does not seem to have had panic attacks recently. In [place] was "always" going to the hospital.   Feb PHQ9 7 Feb GAD7 4   [employer] Trauma Speech therapy 10th grade  Minimizes anxiety sx to most people.    No SI, HI.   Focused ROS   Collateral information:  Need to  call husband Deon  Psychiatric History:  Information collected from patient, medical record No hx psychiatric hospitalizations.   In 2022 was on risperidone 1 mg AM 2 mg QHS, trazodone 50 mg, gabapentin 300 mg TID, hydroxyzine 10 TID.   Family psych history: No known history   Social History:  No EtOH, did drink before pregnancy. Would drink socially. Smokes cigarettes but has cut back now that she is pregnant. Smokes marijuana to calm her nerves.   Graduated 10th grade, no hx special ed  Family History:  The patient's family history includes Asthma in her paternal  grandmother; Diabetes in her brother.  Medical History: Past Medical History:  Diagnosis Date   AKI (acute kidney injury) ([place]) 01/22/2020   Anemia    Depression    Diabetes mellitus without complication ([place])    Hypernatremia 01/22/2020   Hyperthyroidism    PTSD (post-traumatic stress disorder)    SBO (small bowel obstruction) ([place]) 07/07/2019   Sepsis ([place]) 01/22/2020    Surgical History: Past Surgical History:  Procedure Laterality Date   BOWEL RESECTION N/A 07/08/2019   Procedure: Small Bowel Resection;  Surgeon: Clovis Riley, MD;  Location: [facility];  Service: General;  Laterality: N/A;   [place] OF UTERUS     HAND SURGERY Left    LAPAROTOMY N/A 07/08/2019   Procedure: EXPLORATORY LAPAROTOMY;  Surgeon: Clovis Riley, MD;  Location: [facility];  Service: General;  Laterality: N/A;   OOPHORECTOMY Right    SALPINGECTOMY      Medications:   Current Facility-Administered Medications:    0.9 %  sodium chloride infusion, , Intravenous, Continuous, Aletha Halim, MD, Last Rate: 125 mL/hr at 09/12/21 1027, New Bag at 09/12/21 1027   acetaminophen (TYLENOL) tablet 650 mg, 650 mg, Oral, Q4H PRN, Anyanwu, Ugonna A, MD, 650 mg at 09/12/21 1135   [START ON 09/13/2021] ceFAZolin (ANCEF) IVPB 2g/100 mL premix, 2 g, Intravenous, On Call to OR, Maczis, Michael M, PA-C   [COMPLETED] cefTRIAXone (ROCEPHIN) 1 g in sodium chloride 0.9 % 100 mL IVPB, 1 g, Intravenous, Once, Last Rate: 200 mL/hr at 09/12/21 1034, 1 g at 09/12/21 1034 **FOLLOWED BY** [START ON 09/13/2021] cefTRIAXone (ROCEPHIN) 2 g in sodium chloride 0.9 % 100 mL IVPB, 2 g, Intravenous, Q24H, Anyanwu, Ugonna A, MD   docusate sodium (COLACE) capsule 100 mg, 100 mg, Oral, BID PRN, Aletha Halim, MD   Derrill Memo ON 09/13/2021] insulin aspart (novoLOG) injection 0-10 Units, 0-10 Units, Subcutaneous, Q6H, Pickens, Charlie, MD   levothyroxine (SYNTHROID) tablet 75 mcg, 75 mcg,  Oral, Once per day on Mon Tue Wed Thu Fri Sat, Pickens, Charlie, MD, 75 mcg at 09/12/21 1233   zolpidem (AMBIEN) tablet 5 mg, 5 mg, Oral, QHS PRN, Anyanwu, Sallyanne Havers, MD  Allergies: Allergies  Allergen Reactions   Codeine Other (See Comments)    Constipation        Objective  Vital signs:  Temp:  [97.6 F (36.4 C)-98.3 F (36.8 C)] 98.3 F (36.8 C) (03/09 1534) Pulse Rate:  [74-96] 80 (03/09 1534) Resp:  [14-25] 19 (03/09 1534) BP: (94-113)/(64-80) 100/64 (03/09 1534) SpO2:  [98 %-100 %] 100 % (03/09 1534) Weight:  [55.3 kg] 55.3 kg (03/08 2302)  Psychiatric Specialty Exam:  Presentation  General Appearance: Bizarre (hair unkempt, poor dentition) Eye Contact:Fair Speech:-- (Somewhat delayed) Speech Volume:Normal Handedness:Right  Mood and Affect  Mood:-- (difficult to say) Affect:Appropriate; Full Range  Thought Process  Thought Processes:Disorganized Descriptions of Associations:Circumstantial  Orientation:Full (Time, Place and Person)  Thought Content:-- (devoid of SI, HI, paranoia, elicited delusional content)  History of Schizophrenia/Schizoaffective disorder:No data recorded Duration of Psychotic Symptoms:No data recorded Hallucinations:Hallucinations: None  Ideas of Reference:None  Suicidal Thoughts:Suicidal Thoughts: No  Homicidal Thoughts:No data recorded  Sensorium  Memory:Immediate Good; Recent Good; Remote Poor Judgment:Fair Insight:Fair  Executive Functions  Concentration:Fair Attention Span:Fair [place]  Psychomotor Activity  Psychomotor Activity:Psychomotor Activity: Normal  Assets  Assets:Communication Skills; Desire for Improvement  Sleep  Sleep:No data recorded   Physical Exam: Physical Exam HENT:     Head: Normocephalic and atraumatic.  Pulmonary:     Effort: Pulmonary effort is normal.  Neurological:     Mental Status: She is alert and oriented to person, place, and time.   Psychiatric:        Mood and Affect: Mood normal.    Blood pressure 100/64, pulse 80, temperature 98.3 F (36.8 C), temperature source Oral, resp. rate 19, height 5' (1.524 m), weight 55.3 kg, SpO2 100 %. Body mass index is 23.83 kg/m.

## 2021-09-12 NOTE — Progress Notes (Signed)
? ? ?  Faculty Practice OB/GYN Attending Note ? ?Subjective:  ?Patient is feeling better. Can now recall what happened last night, she took her insulin (NPH 10 units and Humalog 10 units) as prescribed but she also said she did not eat before taking this; husband told her she did not eat much during the day. She said she was not feeling too well. Denied fevers, chills, N/V, back pain.  FHR reassuring, no contractions, no LOF or vaginal bleeding. Good FM.  ? ?Admitted on 09/11/2021 for Hypoglycemia associated with diabetes ([place]). ?   ?Objective:  ?Blood pressure 113/71, pulse 90, temperature 98.2 ?F (36.8 ?C), temperature source Oral, resp. rate 18, height 5' (1.524 m), weight 55.3 kg, SpO2 100 %. ?FHR 148 bpm ?Toco: none ?Gen: NAD ?HENT: Normocephalic, atraumatic ?Lungs: Normal respiratory effort ?Heart: Regular rate noted ?Abdomen: NT gravid fundus, soft ?Back: No CVAT ?Cervix: Deferred ?Ext: 2+ DTRs, no edema, no cyanosis, negative Homan's sign ? ?CBG (last 3)  ?Recent Labs  ?  09/12/21 ?0216 09/12/21 ?0350 09/12/21 ?0608  ?GLUCAP 150* 75 95  ? ? ? ?Assessment & Plan:  ?41 y.o. [id number] at 21w4dadmitted for hypoglycemia in the setting of insulin controlled GDM, and UTI. ?- Patient told she needs to eat if taking insulin to prevent hypoglycemia. Continue D51/2NS for now and q2h CBGs, can change later today ?- DM coordinator to help with better education about insulin and diet for patient, and help with a better regimen if needed. May need to continue lower than the weight based regimen and titrate up as needed. ?- Continue Rocephin for UTI ?- Continue close observation. ? ? ?UVerita Schneiders MD, FACOG ?Obstetrician &Social research officer, government Faculty Practice ?[facility]? ? ? ? ?

## 2021-09-13 ENCOUNTER — Inpatient Hospital Stay (HOSPITAL_COMMUNITY): Payer: Commercial Managed Care - HMO | Admitting: Anesthesiology

## 2021-09-13 ENCOUNTER — Encounter (HOSPITAL_COMMUNITY)
Admission: EM | Disposition: A | Discharge status: Home / Self Care | Payer: Self-pay | Source: Home / Self Care | Attending: Obstetrics and Gynecology

## 2021-09-13 ENCOUNTER — Other Ambulatory Visit: Payer: Self-pay

## 2021-09-13 ENCOUNTER — Telehealth: Payer: Self-pay | Admitting: Internal Medicine

## 2021-09-13 ENCOUNTER — Encounter: Payer: Managed Care, Other (non HMO) | Admitting: Family Medicine

## 2021-09-13 ENCOUNTER — Encounter (HOSPITAL_COMMUNITY): Payer: Self-pay | Admitting: Obstetrics & Gynecology

## 2021-09-13 HISTORY — PX: PARATHYROIDECTOMY: SHX19

## 2021-09-13 LAB — GLUCOSE, CAPILLARY
Glucose-Capillary: 79 mg/dL (ref 70–99)
Glucose-Capillary: 138 mg/dL — ABNORMAL HIGH (ref 70–99)
Glucose-Capillary: 62 mg/dL — ABNORMAL LOW (ref 70–99)
Glucose-Capillary: 81 mg/dL (ref 70–99)
Glucose-Capillary: 69 mg/dL — ABNORMAL LOW (ref 70–99)
Glucose-Capillary: 74 mg/dL (ref 70–99)
Glucose-Capillary: 86 mg/dL (ref 70–99)

## 2021-09-13 LAB — COMPREHENSIVE METABOLIC PANEL
ALT: 11 U/L (ref 0–44)
AST: 13 U/L — ABNORMAL LOW (ref 15–41)
Albumin: 2 g/dL — ABNORMAL LOW (ref 3.5–5.0)
Alkaline Phosphatase: 88 U/L (ref 38–126)
Anion gap: 4 — ABNORMAL LOW (ref 5–15)
BUN: 7 mg/dL (ref 6–20)
CO2: 17 mmol/L — ABNORMAL LOW (ref 22–32)
Calcium: 11.5 mg/dL — ABNORMAL HIGH (ref 8.9–10.3)
Chloride: 114 mmol/L — ABNORMAL HIGH (ref 98–111)
Creatinine, Ser: 1.31 mg/dL — ABNORMAL HIGH (ref 0.44–1.00)
GFR, Estimated: 52 mL/min — ABNORMAL LOW (ref >60)
Glucose, Bld: 84 mg/dL (ref 70–99)
Potassium: 4.1 mmol/L (ref 3.5–5.1)
Sodium: 135 mmol/L (ref 135–145)
Total Bilirubin: <0.1 mg/dL — ABNORMAL LOW (ref 0.3–1.2)
Total Protein: 4.9 g/dL — ABNORMAL LOW (ref 6.5–8.1)

## 2021-09-13 LAB — URINE CULTURE

## 2021-09-13 SURGERY — PARATHYROIDECTOMY
Anesthesia: General | Site: Neck | Laterality: Right

## 2021-09-13 MED ORDER — CHLORHEXIDINE GLUCONATE 0.12 % MT SOLN
OROMUCOSAL | Status: AC
Start: 2021-09-13 — End: 2021-09-13
  Administered 2021-09-13: 15 mL via OROMUCOSAL
  Filled 2021-09-13: qty 15

## 2021-09-13 MED ORDER — BUPIVACAINE HCL (PF) 0.25 % IJ SOLN
INTRAMUSCULAR | Status: DC | PRN
Start: 2021-09-13 — End: 2021-09-13
  Administered 2021-09-13: 7 mL

## 2021-09-13 MED ORDER — ORAL CARE MOUTH RINSE: 15.0000 mL | Freq: Once | OROMUCOSAL | Status: AC

## 2021-09-13 MED ORDER — FENTANYL CITRATE (PF) 250 MCG/5ML IJ SOLN
INTRAMUSCULAR | Status: DC | PRN
  Administered 2021-09-13 (×2): 50 ug via INTRAVENOUS

## 2021-09-13 MED ORDER — DEXTROSE-NACL 5-0.45 % IV SOLN: INTRAVENOUS | Status: DC

## 2021-09-13 MED ORDER — MIDAZOLAM HCL 2 MG/2ML IJ SOLN
INTRAMUSCULAR | Status: AC
  Filled 2021-09-13: qty 2

## 2021-09-13 MED ORDER — CEFAZOLIN SODIUM-DEXTROSE 2-4 GM/100ML-% IV SOLN
2.0000 g | INTRAVENOUS | Status: AC
  Administered 2021-09-13: 2 g via INTRAVENOUS
  Filled 2021-09-13 (×2): qty 100

## 2021-09-13 MED ORDER — PROPOFOL 10 MG/ML IV BOLUS
INTRAVENOUS | Status: AC
  Filled 2021-09-13: qty 20

## 2021-09-13 MED ORDER — ACETAMINOPHEN 10 MG/ML IV SOLN
INTRAVENOUS | Status: AC
  Filled 2021-09-13: qty 100

## 2021-09-13 MED ORDER — ONDANSETRON HCL 4 MG/2ML IJ SOLN
INTRAMUSCULAR | Status: DC | PRN
  Administered 2021-09-13: 4 mg via INTRAVENOUS

## 2021-09-13 MED ORDER — BUPIVACAINE HCL (PF) 0.25 % IJ SOLN
INTRAMUSCULAR | Status: AC
  Filled 2021-09-13: qty 30

## 2021-09-13 MED ORDER — DEXTROSE 50 % IV SOLN
12.5000 g | INTRAVENOUS | Status: AC
  Administered 2021-09-13: 12.5 g via INTRAVENOUS
  Filled 2021-09-13: qty 50

## 2021-09-13 MED ORDER — ACETAMINOPHEN 10 MG/ML IV SOLN
INTRAVENOUS | Status: DC | PRN
  Administered 2021-09-13: 1000 mg via INTRAVENOUS

## 2021-09-13 MED ORDER — SUCCINYLCHOLINE CHLORIDE 200 MG/10ML IV SOSY
PREFILLED_SYRINGE | INTRAVENOUS | Status: DC | PRN
  Administered 2021-09-13: 80 mg via INTRAVENOUS

## 2021-09-13 MED ORDER — HEMOSTATIC AGENTS (NO CHARGE) OPTIME
TOPICAL | Status: DC | PRN
  Administered 2021-09-13: 1 via TOPICAL

## 2021-09-13 MED ORDER — PROPOFOL 500 MG/50ML IV EMUL
INTRAVENOUS | Status: DC | PRN
  Administered 2021-09-13: 125 ug/kg/min via INTRAVENOUS

## 2021-09-13 MED ORDER — CHLORHEXIDINE GLUCONATE CLOTH 2 % EX PADS: 6.0000 | MEDICATED_PAD | Freq: Once | CUTANEOUS | Status: DC

## 2021-09-13 MED ORDER — 0.9 % SODIUM CHLORIDE (POUR BTL) OPTIME
TOPICAL | Status: DC | PRN
Start: 2021-09-13 — End: 2021-09-13
  Administered 2021-09-13: 1000 mL

## 2021-09-13 MED ORDER — LIDOCAINE 2% (20 MG/ML) 5 ML SYRINGE
INTRAMUSCULAR | Status: DC | PRN
  Administered 2021-09-13: 100 mg via INTRAVENOUS

## 2021-09-13 MED ORDER — PROPOFOL 10 MG/ML IV BOLUS
INTRAVENOUS | Status: DC | PRN
  Administered 2021-09-13: 140 mg via INTRAVENOUS

## 2021-09-13 MED ORDER — FENTANYL CITRATE (PF) 250 MCG/5ML IJ SOLN
INTRAMUSCULAR | Status: AC
  Filled 2021-09-13: qty 5

## 2021-09-13 MED ORDER — SUGAMMADEX SODIUM 200 MG/2ML IV SOLN
INTRAVENOUS | Status: DC | PRN
Start: 2021-09-13 — End: 2021-09-13
  Administered 2021-09-13: 110 mg via INTRAVENOUS

## 2021-09-13 MED ORDER — LACTATED RINGERS IV SOLN: INTRAVENOUS | Status: DC

## 2021-09-13 MED ORDER — CHLORHEXIDINE GLUCONATE 0.12 % MT SOLN: 15.0000 mL | Freq: Once | OROMUCOSAL | Status: AC

## 2021-09-13 MED ORDER — DEXAMETHASONE SODIUM PHOSPHATE 10 MG/ML IJ SOLN
INTRAMUSCULAR | Status: DC | PRN
  Administered 2021-09-13: 5 mg via INTRAVENOUS

## 2021-09-13 MED ORDER — ROCURONIUM BROMIDE 10 MG/ML (PF) SYRINGE
PREFILLED_SYRINGE | INTRAVENOUS | Status: DC | PRN
Start: 2021-09-13 — End: 2021-09-13
  Administered 2021-09-13: 30 mg via INTRAVENOUS

## 2021-09-13 MED ORDER — SUGAMMADEX SODIUM 200 MG/2ML IV SOLN
INTRAVENOUS | Status: DC | PRN
Start: 2021-09-13 — End: 2021-09-13

## 2021-09-13 SURGICAL SUPPLY — 55 items
ADH SKN CLS APL DERMABOND .7 (GAUZE/BANDAGES/DRESSINGS) ×1
APL PRP STRL LF DISP 70% ISPRP (MISCELLANEOUS) ×1
ATTRACTOMAT 16X20 MAGNETIC DRP (DRAPES) ×3 IMPLANT
BAG COUNTER SPONGE SURGICOUNT (BAG) ×2 IMPLANT
BAG SPNG CNTER NS LX DISP (BAG) ×1
BAG SURGICOUNT SPONGE COUNTING (BAG) ×1
BLADE SURG 15 STRL LF DISP TIS (BLADE) ×1 IMPLANT
BLADE SURG 15 STRL SS (BLADE) ×3
CANISTER SUCT 3000ML PPV (MISCELLANEOUS) ×3 IMPLANT
CHLORAPREP W/TINT 26 (MISCELLANEOUS) ×3 IMPLANT
CLIP VESOCCLUDE MED 6/CT (CLIP) ×3 IMPLANT
CLIP VESOCCLUDE SM WIDE 6/CT (CLIP) ×3 IMPLANT
CLOSURE WOUND 1/2 X4 (GAUZE/BANDAGES/DRESSINGS)
CNTNR URN SCR LID CUP LEK RST (MISCELLANEOUS) ×1 IMPLANT
CONT SPEC 4OZ STRL OR WHT (MISCELLANEOUS) ×3
COVER SURGICAL LIGHT HANDLE (MISCELLANEOUS) ×3 IMPLANT
DERMABOND ADVANCED (GAUZE/BANDAGES/DRESSINGS) ×2
DERMABOND ADVANCED .7 DNX12 (GAUZE/BANDAGES/DRESSINGS) IMPLANT
DRAPE LAPAROTOMY 100X72 PEDS (DRAPES) ×3 IMPLANT
ELECT CAUTERY BLADE 6.4 (BLADE) ×3 IMPLANT
ELECT REM PT RETURN 9FT ADLT (ELECTROSURGICAL) ×3
ELECTRODE REM PT RTRN 9FT ADLT (ELECTROSURGICAL) ×1 IMPLANT
GAUZE 4X4 16PLY ~~LOC~~+RFID DBL (SPONGE) ×3 IMPLANT
GAUZE SPONGE 2X2 8PLY STRL LF (GAUZE/BANDAGES/DRESSINGS) ×1 IMPLANT
GLOVE SURG ORTHO LTX SZ8 (GLOVE) ×3 IMPLANT
GOWN STRL REUS W/ TWL LRG LVL3 (GOWN DISPOSABLE) ×1 IMPLANT
GOWN STRL REUS W/ TWL XL LVL3 (GOWN DISPOSABLE) ×1 IMPLANT
GOWN STRL REUS W/TWL LRG LVL3 (GOWN DISPOSABLE) ×3
GOWN STRL REUS W/TWL XL LVL3 (GOWN DISPOSABLE) ×3
HEMOSTAT ARISTA ABSORB 3G PWDR (HEMOSTASIS) IMPLANT
HEMOSTAT SURGICEL 2X4 FIBR (HEMOSTASIS) ×3 IMPLANT
KIT BASIN OR (CUSTOM PROCEDURE TRAY) ×3 IMPLANT
KIT TURNOVER KIT B (KITS) ×3 IMPLANT
NDL HYPO 25GX1X1/2 BEV (NEEDLE) ×1 IMPLANT
NEEDLE HYPO 25GX1X1/2 BEV (NEEDLE) ×3 IMPLANT
NS IRRIG 1000ML POUR BTL (IV SOLUTION) ×3 IMPLANT
PACK SURGICAL SETUP 50X90 (CUSTOM PROCEDURE TRAY) ×3 IMPLANT
PAD ARMBOARD 7.5X6 YLW CONV (MISCELLANEOUS) ×3 IMPLANT
PENCIL BUTTON HOLSTER BLD 10FT (ELECTRODE) ×3 IMPLANT
POSITIONER HEAD DONUT 9IN (MISCELLANEOUS) ×3 IMPLANT
SPONGE GAUZE 2X2 STER 10/PKG (GAUZE/BANDAGES/DRESSINGS)
SPONGE INTESTINAL PEANUT (DISPOSABLE) IMPLANT
STRIP CLOSURE SKIN 1/2X4 (GAUZE/BANDAGES/DRESSINGS) ×1 IMPLANT
SUT MNCRL AB 4-0 PS2 18 (SUTURE) ×3 IMPLANT
SUT SILK 2 0 (SUTURE)
SUT SILK 2-0 18XBRD TIE 12 (SUTURE) IMPLANT
SUT SILK 3 0 (SUTURE) ×3
SUT SILK 3-0 18XBRD TIE 12 (SUTURE) IMPLANT
SUT VIC AB 3-0 SH 18 (SUTURE) ×3 IMPLANT
SYR BULB EAR ULCER 3OZ GRN STR (SYRINGE) ×3 IMPLANT
SYR CONTROL 10ML LL (SYRINGE) ×3 IMPLANT
TOWEL GREEN STERILE (TOWEL DISPOSABLE) ×3 IMPLANT
TOWEL GREEN STERILE FF (TOWEL DISPOSABLE) ×3 IMPLANT
TUBE CONNECTING 12'X1/4 (SUCTIONS) ×1
TUBE CONNECTING 12X1/4 (SUCTIONS) ×2 IMPLANT

## 2021-09-13 NOTE — Consult Note (Signed)
[facility] New Face-to-Face Psychiatric Evaluation   Service Date: September 13, 2021 LOS:  LOS: 1 day   Portions of interview conducted by Spero Curb MS-III under my direct supervision.   Assessment  Theresa Mccoy is a 41 y.o. female admitted medically for 09/11/2021 10:56 PM for hypoglycemia in pregnancy. She carries the psychiatric diagnoses of alleged bipolar disorder and PTSD and has a past medical history of  hyperthyroidism and hyperparathyroidism.Psychiatry was consulted for "severe bipolar disorder" by Aletha Halim, MD.    Patient was seen in the afternoon. Overall, she was a poor historian and often had to ask questions multiple times in different ways to get an answer. It is difficult to establish a narrative history of her illness however appears to have been diagnosed with bipolar disorder at the age of 47 due to significant irritability. She endorses some symptoms consistent with hypomanic episodes (1-2 days of poor sleep with irritability) however did not endorse any history of psychiatric hospitalizations or a history of full manic episodes; this is consistent with prior psychiatric notes that have been reviewed. Did endorse recent auditory hallucinations and appeared to have some mild thought disorganization and parsed phrases oddly throughout exam. She has a history of pseudocyesis with multiple emergency room visits for this reason (is actually pregnant at time of exam). Will need to gather more collateral from husband; overall picture appears to be more consistent with psychotic spectrum disorder (or bipolar II vs MDD with psychotic features/delusional disorder) than true bipolar I disorder. She did have a psychiatric hospitalization in 2020 (denied any psych hospitalizations on exam) in which she was diagnosed with MDD with psychotic features which is generally consistent with above assessment. She was discharged from that hospitalization on a risperidone LAI with  noted improvement in outbursts and irritability. She endorses an history of trauma as a child and suffers from hypervigilance, nightmares, and flashbacks; has previously been diagnosed with PTSD which I generally agree with. She also has both hyperthyroidism and hyperparathyroidism which will worsen underlying psychiatric disorders (psych hx does appear to predate endocrine abnormalities).  Patient is open to taking psychotropic medications during pregnancy "if they are safe for her baby". Discussed this topic generally with pt; will hold off on medications for now (building alliance, pt undergoing surgery tomorrow for parathyroid removal, hx prolonged qtc and undergoing anesthesia). Regardless of underlying diagnosis, both pt and review of records suggest she has done well on risperidone and will likely suggest this as monotherapy to minimize polypharmacy and reduce risk of harm to baby.   3/10: Saw pt as below. Left informational pamphlets on haloperidol and risperidone in pregnancy with pt. Attempted to see pt shortly after lunch and unfortunately arrived right after she left for surgery. Based on initial interview and chart review, would favor monotherapy with antipsychotic during pregnancy or starting shortly after delivery (if pt opts for no treatment).   Diagnoses:  Active Hospital problems: Principal Problem:   Hypoglycemia associated with gestational diabetes [facility]) Active Problems:   UTI in pregnancy   Primary hyperparathyroidism ([place])   AKI (acute kidney injury) ([place])   Severe bipolar I disorder, current or most recent episode depressed ([place])   Supervision of high risk pregnancy, antepartum   Previous cesarean delivery x 2 affecting pregnancy, antepartum   Gestational diabetes mellitus (GDM) affecting pregnancy, antepartum   Hypothyroidism   Chronic hypertension affecting pregnancy   Anemia in pregnancy, second trimester     Plan  ## Safety and Observation Level:  -  Based on my  clinical evaluation, I estimate the patient to be at low risk of self harm in the current setting - At this time, we recommend a routine level of observation. This decision is based on my review of the chart including patient's history and current presentation, interview of the patient, mental status examination, and consideration of suicide risk including evaluating suicidal ideation, plan, intent, suicidal or self-harm behaviors, risk factors, and protective factors. This judgment is based on our ability to directly address suicide risk, implement suicide prevention strategies and develop a safety plan while the patient is in the clinical setting. Please contact our team if there is a concern that risk level has changed.   ## Medications:  -- defer  ## Medical Decision Making Capacity:  Not formally assessed  ## Further Work-up:  -- consider B12 -- most recent EKG on 09/12/20 had QtC of 400  ## Disposition:  -- Does not appear to require psychiatric hospitalization when medically cleared    Thank you for this consult request. Recommendations have been communicated to the primary team.  We will continue to follow at this time.   [name]   NEW history  Relevant Aspects of Hospital Course:  Admitted on 09/11/2021 for hypoglycemia; new dx diabetes with this pregnancy.  Patient Report:   ROS:  Pt seen in mid-AM. She is alert and oriented. She denies SI, HI, AH/VH. She has mild thought disorganization and frequently laughs inappropriately during conversation. Discussed again r/b/se of treatment during pregnancy outlaying 3 main options (left information in room outlying treatment options and mothertobaby sheets for haloperidol and risperidone). Pt with difficulty making a decision however is clearly trying to choose what is best for herself and her baby.   1) No treatment  - risk of worse mood and anger especially in postpartum period  - no medication exposure to baby 2)  Risperidone  - known efficacy for this pt  - less data in pregnancy than haloperidol 3) Haloperidol  - unknown efficacy in this pt  - more safety data in pregnancy than risperidone.   Attempted to see pt around 1 PM however had just left for surgery; will not see again today.  Focused ROS Hunger, confusion about NPO status  Collateral information:  Need to call husband Deon - have attempted with no success.   Psychiatric History:  Information collected from patient, medical record No hx psychiatric hospitalizations.   In 2022 was on risperidone 1 mg AM 2 mg QHS, trazodone 50 mg, gabapentin 300 mg TID, hydroxyzine 10 TID.   Family psych history: No known history   Social History:  No EtOH, did drink before pregnancy. Would drink socially. Smokes cigarettes but has cut back now that she is pregnant. Smokes marijuana to calm her nerves.   Graduated 10th grade, no hx special ed  Family History:  The patient's family history includes Asthma in her paternal grandmother; Diabetes in her brother.  Medical History: Past Medical History:  Diagnosis Date   AKI (acute kidney injury) ([place]) 01/22/2020   Anemia    Depression    Diabetes mellitus without complication (H[facility])    Hypernatremia 01/22/2020   Hyperthyroidism    PTSD (post-traumatic stress disorder)    SBO (small bowel obstruction) ([place]) 07/07/2019   Sepsis ([place]) 01/22/2020    Surgical History: Past Surgical History:  Procedure Laterality Date   BOWEL RESECTION N/A 07/08/2019   Procedure: Small Bowel Resection;  Surgeon: Clovis Riley, MD;  Location: [facility]  OR;  Service: General;  Laterality: N/A;   CESAREAN SECTION     DILATION AND CURETTAGE OF UTERUS     HAND SURGERY Left    LAPAROTOMY N/A 07/08/2019   Procedure: EXPLORATORY LAPAROTOMY;  Surgeon: Clovis Riley, MD;  Location: [facility];  Service: General;  Laterality: N/A;   OOPHORECTOMY Right    SALPINGECTOMY      Medications:   Current  Facility-Administered Medications:    0.9 % irrigation (POUR BTL), , , PRN, Armandina Gemma, MD, 1,000 mL at 09/13/21 1528   [MAR Hold] acetaminophen (TYLENOL) tablet 650 mg, 650 mg, Oral, Q4H PRN, Anyanwu, Ugonna A, MD, 650 mg at 09/12/21 1135   bupivacaine (PF) (MARCAINE) 0.25 % injection, , , PRN, Armandina Gemma, MD, 10 mL at 09/13/21 1528   ceFAZolin (ANCEF) IVPB 2g/100 mL premix, 2 g, Intravenous, On Call to OR, Armandina Gemma, MD   6 CHG cloth bath night before surgery, , , Once **AND** [START ON 09/14/2021] 6 CHG cloth bath AM of surgery, , , Once **AND** Chlorhexidine Gluconate Cloth 2 % PADS 6 each, 6 each, Topical, Once **AND** Chlorhexidine Gluconate Cloth 2 % PADS 6 each, 6 each, Topical, Once, Gerkin, Todd, MD   dextrose 5 %-0.45 % sodium chloride infusion, , Intravenous, Continuous, Pickens, Charlie, MD, Last Rate: 125 mL/hr at 09/13/21 1203, Infusion Verify at 09/13/21 1203   [MAR Hold] docusate sodium (COLACE) capsule 100 mg, 100 mg, Oral, BID PRN, Aletha Halim, MD   [facility] Hold] insulin aspart (novoLOG) injection 0-10 Units, 0-10 Units, Subcutaneous, Q6H, Aletha Halim, MD   lactated ringers infusion, , Intravenous, Continuous, Barnet Glasgow, MD, Last Rate: 10 mL/hr at 09/13/21 1337, New Bag at 09/13/21 1337   [MAR Hold] levothyroxine (SYNTHROID) tablet 75 mcg, 75 mcg, Oral, Once per day on Mon Tue Wed Thu Fri Sat, Pickens, Charlie, MD, 75 mcg at 09/13/21 0745   [MAR Hold] zolpidem (AMBIEN) tablet 5 mg, 5 mg, Oral, QHS PRN, Anyanwu, Sallyanne Havers, MD  Allergies: Allergies  Allergen Reactions   Codeine Other (See Comments)    Constipation        Objective  Vital signs:  Temp:  [97.8 F (36.6 C)-98.6 F (37 C)] 98.2 F (36.8 C) (03/10 1322) Pulse Rate:  [70-106] 70 (03/10 1322) Resp:  [16-20] 20 (03/10 1322) BP: (92-116)/(62-80) 111/70 (03/10 1329) SpO2:  [97 %-100 %] 100 % (03/10 1322) Weight:  [55.3 kg] 55.3 kg (03/10 1322)  Psychiatric Specialty  Exam:  Presentation  General Appearance: Bizarre (poor dentition) Eye Contact:Poor Speech:Clear and Coherent Speech Volume:Normal Handedness:Right  Mood and Affect  Mood:-- (Confused "why can't I eat?) Affect:Inappropriate (laughing inappropriately)  Thought Process  Thought Processes:Disorganized Descriptions of Associations:Circumstantial  Orientation:Full (Time, Place and Person)  Thought Content:-- (devoid of SI, HI, delusions (is actually pregnant), paranoia)  History of Schizophrenia/Schizoaffective disorder:No data recorded Duration of Psychotic Symptoms:No data recorded Hallucinations:Hallucinations: None  Ideas of Reference:None  Suicidal Thoughts:Suicidal Thoughts: No  Homicidal Thoughts:Homicidal Thoughts: No   Sensorium  Memory:Immediate Good; Recent Good; Remote Poor Judgment:Fair Insight:Fair  Executive Functions  Concentration:Fair Attention Span:[place]  Psychomotor Activity  Psychomotor Activity:Psychomotor Activity: Normal  Assets  Assets:Communication Skills; Desire for Improvement  Sleep  Sleep:No data recorded   Physical Exam: Physical Exam HENT:     Head: Normocephalic and atraumatic.  Pulmonary:     Effort: Pulmonary effort is normal.  Neurological:     Mental Status: She is alert and oriented to person, place, and  time.  Psychiatric:        Mood and Affect: Mood normal.    Blood pressure 111/70, pulse 70, temperature 98.2 F (36.8 C), temperature source Oral, resp. rate 20, height 5' (1.524 m), weight 55.3 kg, SpO2 100 %. Body mass index is 23.81 kg/m.

## 2021-09-13 NOTE — Op Note (Signed)
OPERATIVE REPORT - PARATHYROIDECTOMY ? ?Preoperative diagnosis: Primary hyperparathyroidism ? ?Postop diagnosis: Same ? ?Procedure: right inferior minimally invasive parathyroidectomy ? ?Surgeon:  Armandina Gemma, MD ? ?Anesthesia: General endotracheal ? ?Estimated blood loss: Minimal ? ?Preparation: ChloraPrep ? ?Indications: Patient with primary hyperparathyroidism for several years.  Sestamibi positive in 2021 for right inferior adenoma.  USN last month confirms 68m enlarged parathyroid at right inferior position.  Recent high calcium of 13.2 and PTH of 200.  Asked to see patient for parathyroidectomy but patient has never been able to come to office for appointments.  Now admitted with UTI. At end of second trimester of pregnancy.  Endocrinology and OB/GYN desire that we proceed with parathyroidectomy at this time. ? ?Procedure: The patient was prepared in the pre-operative holding area. The patient was brought to the operating room and placed in a supine position on the operating room table. Following administration of general anesthesia, the patient was positioned and then prepped and draped in the usual strict aseptic fashion. After ascertaining that an adequate level of anesthesia been achieved, a neck incision was made with a #15 blade. Dissection was carried through subcutaneous tissues and platysma. Hemostasis was obtained with the electrocautery. Skin flaps were developed circumferentially and a Weitlander retractor was placed for exposure. ? ?Strap muscles were incised in the midline. Strap muscles were reflected laterally exposing the thyroid lobe. With gentle blunt dissection the thyroid lobe was mobilized.  Dissection was carried posteriorly and an enlarged parathyroid gland was identified. It was gently mobilized. Vascular structures were divided between small ligaclips. Care was taken to avoid the recurrent laryngeal nerve. The parathyroid gland was completely excised. It was submitted to pathology  where frozen section confirmed hypercellular parathyroid tissue consistent with adenoma. ? ?Neck was irrigated with warm saline and good hemostasis was noted. Fibrillar was placed in the operative field. Strap muscles were approximated in the midline with interrupted 3-0 Vicryl sutures. Platysma was closed with interrupted 3-0 Vicryl sutures. Marcaine was infiltrated circumferentially. Skin was closed with a running 4-0 Monocryl subcuticular suture. Wound was washed and dried and Dermabond was applied. Patient was awakened from anesthesia and brought to the recovery room. The patient tolerated the procedure well. ? ? ?TArmandina Gemma MD ?[facility] ?Office: [phone] ?

## 2021-09-13 NOTE — Anesthesia Preprocedure Evaluation (Addendum)
Anesthesia Evaluation  ?Patient identified by MRN, date of birth, ID band ?Patient awake ? ? ? ?Reviewed: ?Allergy & Precautions, NPO status , Patient's Chart, lab work & pertinent test results ? ?Airway ?Mallampati: II ? ?TM Distance: >3 FB ?Neck ROM: Full ? ? ? Dental ?no notable dental hx. ?(+) Missing, Dental Advisory Given,  ?  ?Pulmonary ?Current Smoker and Patient abstained from smoking.,  ?  ?Pulmonary exam normal ?breath sounds clear to auscultation ? ? ? ? ? ? Cardiovascular ?hypertension, Normal cardiovascular exam ?Rhythm:Regular Rate:Normal ? ? ?  ?Neuro/Psych ?PSYCHIATRIC DISORDERS Bipolar Disorder negative neurological ROS ?   ? GI/Hepatic ?negative GI ROS, Neg liver ROS,   ?Endo/Other  ?diabetesHypothyroidism Hyperthyroidism  ? Renal/GU ?Lab Results ?     Component                Value               Date                 ?     CREATININE               1.31 (H)            09/13/2021          ?     K                        4.1                 09/13/2021           ?       ? ?  ?Musculoskeletal ? ? Abdominal ?  ?Peds ? Hematology ? ?(+) Blood dyscrasia, anemia , Lab Results ?     Component                Value               Date                      ?     HGB                      9.2 (L)             09/12/2021           ?     HCT                      28.3 (L)            09/12/2021              ?     PLT                      254                 09/12/2021           ?   ?Anesthesia Other Findings ? ? Reproductive/Obstetrics ?(+) Pregnancy ?G12P2 at 25.5 Wks OB Consult Dr Damita Dunnings on Chart  ?Hx of 2 prior c/s ? ?  ? ? ? ? ? ? ? ? ? ? ? ? ? ?  ?  ? ? ? ? ? ? ?Anesthesia Physical ?Anesthesia Plan ? ?ASA: 3 ? ?Anesthesia Plan: General  ? ?Post-op Pain Management: Ofirmev IV (intra-op)* and Dilaudid IV  ? ?  Induction: Intravenous ? ?PONV Risk Score and Plan: 3 and Ondansetron and Treatment may vary due to age or medical condition ? ?Airway Management Planned: Oral ETT ? ?Additional  Equipment:  ? ?Intra-op Plan:  ? ?Post-operative Plan: Extubation in OR ? ?Informed Consent:  ? ? ? ?Dental advisory given ? ?Plan Discussed with:  ? ?Anesthesia Plan Comments: (GA + fetal monitoring Per DR Damita Dunnings)  ? ? ? ? ? ? ?Anesthesia Quick Evaluation ? ?
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
## 2021-09-13 NOTE — Progress Notes (Signed)
? ? ?  Faculty Practice OB/GYN Attending Note ? ?Subjective:  ?Patient is feeling well this AM but reports she is hungry.  Denied fevers, chills, N/V, back pain.  FHR reassuring, no contractions, no LOF or vaginal bleeding. Good FM.  ? ?Admitted on 09/11/2021 for Hypoglycemia associated with diabetes ([place]). ?   ?Objective:  ?Blood pressure 99/80, pulse 80, temperature 98.5 ?F (36.9 ?C), temperature source Oral, resp. rate 16, height 5' (1.524 m), weight 55.3 kg, SpO2 97 %. ?FHR 148 bpm ?Toco: none ?Gen: NAD ?HENT: Normocephalic, atraumatic ?Lungs: Normal respiratory effort ?Heart: Regular rate noted ?Abdomen: NT gravid fundus, soft ?Back: No CVAT ?Cervix: Deferred ?Ext: 2+ DTRs, no edema, no cyanosis, negative Homan's sign ? ?CBG (last 3)  ?Recent Labs  ?  09/12/21 ?1619 09/12/21 ?2251 09/13/21 ?0527  ?GLUCAP 107* 99 79  ? ? ? ?Assessment & Plan:  ?41 y.o. [id number] at 38w5dadmitted for hypoglycemia in the setting of insulin controlled GDM, UTI, known hyperparathyroidism ?- DM coordinator to help with better education about insulin and diet for patient, and help with a better regimen if needed. May need to continue lower than the weight based regimen and titrate up as needed. CBGs under better control. Fasting this am was 79.  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 ? ?PRadene Gunning FACOG ?Obstetrician &Social research officer, government Faculty Practice ?[facility]? ? ? ? ?

## 2021-09-13 NOTE — Interval H&P Note (Signed)
History and Physical Interval Note: ? ?09/13/2021 ?2:32 PM ? ?Theresa Mccoy  has presented today for surgery, with the diagnosis of hyperparathyroidism.  The various methods of treatment have been discussed with the patient and family. After consideration of risks, benefits and other options for treatment, the patient has consented to  ? ? Procedure(s): ?RIGHT INFERIOR PARATHYROIDECTOMY (Right) as a surgical intervention.   ? ?The patient's history has been reviewed, patient examined, no change in status, stable for surgery.  I have reviewed the patient's chart and labs.  Questions were answered to the patient's satisfaction.   ? ?Armandina Gemma, MD ?[facility] ?A [facility] practice ?Office: [phone] ? ? ?Armandina Gemma ? ? ?

## 2021-09-13 NOTE — Transfer of Care (Signed)
Immediate Anesthesia Transfer of Care Note ? ?Patient: Theresa Mccoy ? ?Procedure(s) Performed: RIGHT INFERIOR PARATHYROIDECTOMY (Right: Neck) ? ?Patient Location: PACU ? ?Anesthesia Type:General ? ?Level of Consciousness: awake and alert  ? ?Airway & Oxygen Therapy: Patient Spontanous Breathing and Patient connected to face mask oxygen ? ?Post-op Assessment: Report given to RN and Post -op Vital signs reviewed and stable ? ?Post vital signs: Reviewed and stable  ?OB RN to PACU for Fetal Monitoring per OBGYN ? ?Last Vitals:  ?Vitals Value Taken Time  ?BP 99/72 09/13/21 1635  ?Temp    ?Pulse 77 09/13/21 1635  ?Resp 24 09/13/21 1635  ?SpO2 99 % 09/13/21 1635  ?Vitals shown include unvalidated device data. ? ?Last Pain:  ?Vitals:  ? 09/13/21 1329  ?TempSrc:   ?PainSc: 0-No pain  ?   ? ?Patients Stated Pain Goal: 0 (09/13/21 1329) ? ?Complications: No notable events documented. ?

## 2021-09-13 NOTE — Progress Notes (Signed)
G12P2 at 25 5/7 weeks post op of parathyroidectomy in [place] 12.  Dopplered FHT's 135.  No c/o cxns, bleeding or leaking.  Abdomen palpates soft and nontender.  Patient in good spirits.  Will return to [facility] after stay in PACU. ?

## 2021-09-13 NOTE — Progress Notes (Signed)
OB Note ?Please call OB rapid response for fetal monitoring in the PACU (vocera [phone], OB rapid response) ? ?Durene Romans MD ?Attending ?[facility] [employer] farm manager) ?09/13/2021 ?Time: 0827 ? ?

## 2021-09-13 NOTE — Progress Notes (Signed)
? ? ?   ?Subjective: ?CC: ?No complaints this am. States she understands discussion from Dr. Ilda Basset about fetal risks/risk to baby during surgery as well as risks discussed for surgery by Dr. Harlow Asa. Agreeable to proceed. Understands she is to stay NPO.  ? ?Objective: ?Vital signs in last 24 hours: ?Temp:  [98 ?F (36.7 ?C)-98.5 ?F (36.9 ?C)] 98.5 ?F (36.9 ?C) (03/10 3016) ?Pulse Rate:  [80-106] 87 (03/10 0337) ?Resp:  [16-19] 16 (03/10 0337) ?BP: (92-113)/(62-74) 97/74 (03/10 0109) ?SpO2:  [100 %] 100 % (03/10 0337) ?  ? ?Intake/Output from previous day: ?03/09 0701 - 03/10 0700 ?In: 446 [I.V.:446] ?Out: -  ?Intake/Output this shift: ?No intake/output data recorded. ? ?PE: ?Gen:  Alert, NAD, pleasant.  ?Neck: No obvious thyromegaly or palpable nodule.  Trachea midline.  No stridor. ?Heart: regular, rate, and rhythm ?Lungs: CTAB, no wheezes, rhonchi, or rales noted.  Respiratory effort nonlabored ?Abd: soft, gravid abdomen that is NT, ND, +BS ? ?Lab Results:  ?Recent Labs  ?  09/11/21 ?2310 09/12/21 ?0443  ?WBC 13.3* 12.0*  ?HGB 9.9* 9.2*  ?HCT 29.4* 28.3*  ?PLT 287 254  ? ?BMET ?Recent Labs  ?  09/12/21 ?0443 09/13/21 ?0454  ?NA 137 135  ?K 4.4 4.1  ?CL 113* 114*  ?CO2 19* 17*  ?GLUCOSE 80 84  ?BUN 5* 7  ?CREATININE 1.07* 1.31*  ?CALCIUM 12.0* 11.5*  ? ?PT/INR ?No results for input(s): LABPROT, INR in the last 72 hours. ?CMP  ?   ?Component Value Date/Time  ? NA 135 09/13/2021 0454  ? NA 135 08/05/2021 1119  ? K 4.1 09/13/2021 0454  ? CL 114 (H) 09/13/2021 0454  ? CO2 17 (L) 09/13/2021 0454  ? GLUCOSE 84 09/13/2021 0454  ? BUN 7 09/13/2021 0454  ? BUN 8 08/05/2021 1119  ? CREATININE 1.31 (H) 09/13/2021 0454  ? CALCIUM 11.5 (H) 09/13/2021 0454  ? CALCIUM 13.5 ([place]) 05/26/2021 0304  ? PROT 4.9 (L) 09/13/2021 0454  ? PROT 6.0 08/05/2021 1119  ? ALBUMIN 2.0 (L) 09/13/2021 0454  ? ALBUMIN 3.4 (L) 08/05/2021 1119  ? AST 13 (L) 09/13/2021 0454  ? ALT 11 09/13/2021 0454  ? ALKPHOS 88 09/13/2021 0454  ? BILITOT <0.1 (L)  09/13/2021 0454  ? BILITOT <0.2 08/05/2021 1119  ? GFRNONAA 52 (L) 09/13/2021 0454  ? GFRAA 127 02/07/2020 1551  ? ?Lipase  ?   ?Component Value Date/Time  ? LIPASE 34 05/25/2021 2135  ? ? ?Studies/Results: ?No results found. ? ?Anti-infectives: ?Anti-infectives (From admission, onward)  ? ? Start     Dose/Rate Route Frequency Ordered Stop  ? 09/13/21 1000  cefTRIAXone (ROCEPHIN) 2 g in sodium chloride 0.9 % 100 mL IVPB       ?See Hyperspace for full Linked Orders Report.  ? 2 g ?200 mL/hr over 30 Minutes Intravenous Every 24 hours 09/12/21 0238    ? 09/13/21 0600  ceFAZolin (ANCEF) IVPB 2g/100 mL premix       ? 2 g ?200 mL/hr over 30 Minutes Intravenous On call to O.R. 09/12/21 1451 09/14/21 0559  ? 09/12/21 1000  cefTRIAXone (ROCEPHIN) 1 g in sodium chloride 0.9 % 100 mL IVPB       ?See Hyperspace for full Linked Orders Report.  ? 1 g ?200 mL/hr over 30 Minutes Intravenous  Once 09/12/21 0238 09/12/21 1104  ? 09/12/21 0045  cefTRIAXone (ROCEPHIN) 1 g in sodium chloride 0.9 % 100 mL IVPB       ? 1 g ?  200 mL/hr over 30 Minutes Intravenous  Once 09/12/21 0040 09/12/21 0144  ? ?  ? ? ? ?Assessment/Plan ?Primary hyperparathyroidism ?R inferior parathyroid adenoma ?- Plan for OR with Dr. Harlow Asa today for right inferior parathyroidectomy. Dr. Harlow Asa has discussed surgery with patient, please see his attestation from yesterday. Dr. Ilda Basset has discussed risks to baby with patient as well. She states understanding and is agreeable to proceed. Per OB's note, please call OB rapid response for fetal monitoring in the PACU (vocera [phone], OB rapid response) ?  ?FEN: NPO ?ID: rocephin (UTI). Ancef on call to OR ?VTE: Okay for chemical prophylaxis from our standpoint ?  ?Per primary: ?Hypothyroidism ?UTI ?Gestational diabetes mellitus ?Chronic hypertension ?Hypoglycemia ?[id number] at [redacted] weeks gestation ?Psych following to determine if any psychotropic medications during pregnancy ? ? LOS: 1 day  ? ? ?Jillyn Ledger ,  PA-C ?[facility] ?09/13/2021, 7:19 AM ?Please see Amion for pager number during day hours 7:00am-4:30pm ? ?

## 2021-09-13 NOTE — H&P (View-Only) (Signed)
? ? ?   ?Subjective: ?CC: ?No complaints this am. States she understands discussion from Dr. Ilda Basset about fetal risks/risk to baby during surgery as well as risks discussed for surgery by Dr. Harlow Asa. Agreeable to proceed. Understands she is to stay NPO.  ? ?Objective: ?Vital signs in last 24 hours: ?Temp:  [98 ?F (36.7 ?C)-98.5 ?F (36.9 ?C)] 98.5 ?F (36.9 ?C) (03/10 9371) ?Pulse Rate:  [80-106] 87 (03/10 0337) ?Resp:  [16-19] 16 (03/10 0337) ?BP: (92-113)/(62-74) 97/74 (03/10 6967) ?SpO2:  [100 %] 100 % (03/10 0337) ?  ? ?Intake/Output from previous day: ?03/09 0701 - 03/10 0700 ?In: 446 [I.V.:446] ?Out: -  ?Intake/Output this shift: ?No intake/output data recorded. ? ?PE: ?Gen:  Alert, NAD, pleasant.  ?Neck: No obvious thyromegaly or palpable nodule.  Trachea midline.  No stridor. ?Heart: regular, rate, and rhythm ?Lungs: CTAB, no wheezes, rhonchi, or rales noted.  Respiratory effort nonlabored ?Abd: soft, gravid abdomen that is NT, ND, +BS ? ?Lab Results:  ?Recent Labs  ?  09/11/21 ?2310 09/12/21 ?0443  ?WBC 13.3* 12.0*  ?HGB 9.9* 9.2*  ?HCT 29.4* 28.3*  ?PLT 287 254  ? ?BMET ?Recent Labs  ?  09/12/21 ?0443 09/13/21 ?0454  ?NA 137 135  ?K 4.4 4.1  ?CL 113* 114*  ?CO2 19* 17*  ?GLUCOSE 80 84  ?BUN 5* 7  ?CREATININE 1.07* 1.31*  ?CALCIUM 12.0* 11.5*  ? ?PT/INR ?No results for input(s): LABPROT, INR in the last 72 hours. ?CMP  ?   ?Component Value Date/Time  ? NA 135 09/13/2021 0454  ? NA 135 08/05/2021 1119  ? K 4.1 09/13/2021 0454  ? CL 114 (H) 09/13/2021 0454  ? CO2 17 (L) 09/13/2021 0454  ? GLUCOSE 84 09/13/2021 0454  ? BUN 7 09/13/2021 0454  ? BUN 8 08/05/2021 1119  ? CREATININE 1.31 (H) 09/13/2021 0454  ? CALCIUM 11.5 (H) 09/13/2021 0454  ? CALCIUM 13.5 ([place]) 05/26/2021 0304  ? PROT 4.9 (L) 09/13/2021 0454  ? PROT 6.0 08/05/2021 1119  ? ALBUMIN 2.0 (L) 09/13/2021 0454  ? ALBUMIN 3.4 (L) 08/05/2021 1119  ? AST 13 (L) 09/13/2021 0454  ? ALT 11 09/13/2021 0454  ? ALKPHOS 88 09/13/2021 0454  ? BILITOT <0.1 (L)  09/13/2021 0454  ? BILITOT <0.2 08/05/2021 1119  ? GFRNONAA 52 (L) 09/13/2021 0454  ? GFRAA 127 02/07/2020 1551  ? ?Lipase  ?   ?Component Value Date/Time  ? LIPASE 34 05/25/2021 2135  ? ? ?Studies/Results: ?No results found. ? ?Anti-infectives: ?Anti-infectives (From admission, onward)  ? ? Start     Dose/Rate Route Frequency Ordered Stop  ? 09/13/21 1000  cefTRIAXone (ROCEPHIN) 2 g in sodium chloride 0.9 % 100 mL IVPB       ?See Hyperspace for full Linked Orders Report.  ? 2 g ?200 mL/hr over 30 Minutes Intravenous Every 24 hours 09/12/21 0238    ? 09/13/21 0600  ceFAZolin (ANCEF) IVPB 2g/100 mL premix       ? 2 g ?200 mL/hr over 30 Minutes Intravenous On call to O.R. 09/12/21 1451 09/14/21 0559  ? 09/12/21 1000  cefTRIAXone (ROCEPHIN) 1 g in sodium chloride 0.9 % 100 mL IVPB       ?See Hyperspace for full Linked Orders Report.  ? 1 g ?200 mL/hr over 30 Minutes Intravenous  Once 09/12/21 0238 09/12/21 1104  ? 09/12/21 0045  cefTRIAXone (ROCEPHIN) 1 g in sodium chloride 0.9 % 100 mL IVPB       ? 1 g ?  200 mL/hr over 30 Minutes Intravenous  Once 09/12/21 0040 09/12/21 0144  ? ?  ? ? ? ?Assessment/Plan ?Primary hyperparathyroidism ?R inferior parathyroid adenoma ?- Plan for OR with Dr. Harlow Asa today for right inferior parathyroidectomy. Dr. Harlow Asa has discussed surgery with patient, please see his attestation from yesterday. Dr. Ilda Basset has discussed risks to baby with patient as well. She states understanding and is agreeable to proceed. Per OB's note, please call OB rapid response for fetal monitoring in the PACU (vocera [phone], OB rapid response) ?  ?FEN: NPO ?ID: rocephin (UTI). Ancef on call to OR ?VTE: Okay for chemical prophylaxis from our standpoint ?  ?Per primary: ?Hypothyroidism ?UTI ?Gestational diabetes mellitus ?Chronic hypertension ?Hypoglycemia ?[id number] at [redacted] weeks gestation ?Psych following to determine if any psychotropic medications during pregnancy ? ? LOS: 1 day  ? ? ?Jillyn Ledger ,  PA-C ?[facility] ?09/13/2021, 7:19 AM ?Please see Amion for pager number during day hours 7:00am-4:30pm ? ?

## 2021-09-13 NOTE — Anesthesia Procedure Notes (Signed)
Procedure Name: Intubation ?Date/Time: 09/13/2021 3:07 PM ?Performed by: Reece Agar, CRNA ?Pre-anesthesia Checklist: Patient identified, Emergency Drugs available, Suction available and Patient being monitored ?Patient Re-evaluated:Patient Re-evaluated prior to induction ?Oxygen Delivery Method: Circle System Utilized ?Preoxygenation: Pre-oxygenation with 100% oxygen ?Induction Type: IV induction, Rapid sequence and Cricoid Pressure applied ?Ventilation: Mask ventilation without difficulty ?Laryngoscope Size: Mac and 3 ?Grade View: Grade I ?Tube type: Oral ?Tube size: 7.0 mm ?Number of attempts: 1 ?Airway Equipment and Method: Stylet ?Placement Confirmation: ETT inserted through vocal cords under direct vision, positive ETCO2 and breath sounds checked- equal and bilateral ?Secured at: 21 cm ?Tube secured with: Tape ?Dental Injury: Teeth and Oropharynx as per pre-operative assessment  ? ? ? ? ?

## 2021-09-13 NOTE — Progress Notes (Signed)
Date and time results received: 09/13/21 1136 ? ?Test: CBG ?Critical Value: 62 ? ?Name of Provider Notified: Dr. Ilda Basset at 1137 ? ?Orders Received? Or Actions Taken?: Provider placed orders for D50 and CBG recheck was 138. Provider notified of recheck via [place].  ?

## 2021-09-14 LAB — COMPREHENSIVE METABOLIC PANEL
ALT: 10 U/L (ref 0–44)
AST: 12 U/L — ABNORMAL LOW (ref 15–41)
Albumin: 1.7 g/dL — ABNORMAL LOW (ref 3.5–5.0)
Alkaline Phosphatase: 80 U/L (ref 38–126)
Anion gap: 5 (ref 5–15)
BUN: 9 mg/dL (ref 6–20)
CO2: 17 mmol/L — ABNORMAL LOW (ref 22–32)
Calcium: 10.1 mg/dL (ref 8.9–10.3)
Chloride: 112 mmol/L — ABNORMAL HIGH (ref 98–111)
Creatinine, Ser: 1.31 mg/dL — ABNORMAL HIGH (ref 0.44–1.00)
GFR, Estimated: 52 mL/min — ABNORMAL LOW (ref >60)
Glucose, Bld: 124 mg/dL — ABNORMAL HIGH (ref 70–99)
Potassium: 4.1 mmol/L (ref 3.5–5.1)
Sodium: 134 mmol/L — ABNORMAL LOW (ref 135–145)
Total Bilirubin: 0.2 mg/dL — ABNORMAL LOW (ref 0.3–1.2)
Total Protein: 4.1 g/dL — ABNORMAL LOW (ref 6.5–8.1)

## 2021-09-14 LAB — RETICULOCYTES
Immature Retic Fract: 13.6 % (ref 2.3–15.9)
RBC.: 2.28 MIL/uL — ABNORMAL LOW (ref 3.87–5.11)
Retic Count, Absolute: 29.9 10*3/uL (ref 19.0–186.0)
Retic Ct Pct: 1.3 % (ref 0.4–3.1)

## 2021-09-14 LAB — GLUCOSE, CAPILLARY
Glucose-Capillary: 109 mg/dL — ABNORMAL HIGH (ref 70–99)
Glucose-Capillary: 110 mg/dL — ABNORMAL HIGH (ref 70–99)
Glucose-Capillary: 116 mg/dL — ABNORMAL HIGH (ref 70–99)
Glucose-Capillary: 142 mg/dL — ABNORMAL HIGH (ref 70–99)
Glucose-Capillary: 94 mg/dL (ref 70–99)
Glucose-Capillary: 96 mg/dL (ref 70–99)

## 2021-09-14 LAB — IRON AND TIBC
Iron: 42 ug/dL (ref 28–170)
Saturation Ratios: 18 % (ref 10.4–31.8)
TIBC: 239 ug/dL — ABNORMAL LOW (ref 250–450)
UIBC: 197 ug/dL

## 2021-09-14 LAB — T4, FREE: Free T4: 1.4 ng/dL — ABNORMAL HIGH (ref 0.61–1.12)

## 2021-09-14 LAB — FOLATE: Folate: 9.8 ng/mL (ref >5.9)

## 2021-09-14 LAB — TSH: TSH: <0.01 u[IU]/mL — ABNORMAL LOW (ref 0.350–4.500)

## 2021-09-14 LAB — FERRITIN: Ferritin: 30 ng/mL (ref 11–307)

## 2021-09-14 LAB — CALCIUM, IONIZED: Calcium, Ionized, Serum: 7.2 mg/dL — ABNORMAL HIGH (ref 4.5–5.6)

## 2021-09-14 LAB — VITAMIN B12: Vitamin B-12: 85 pg/mL — ABNORMAL LOW (ref 180–914)

## 2021-09-14 MED ORDER — INSULIN ASPART 100 UNIT/ML IJ SOLN: 0.0000 [IU] | Freq: Three times a day (TID) | INTRAMUSCULAR | Status: DC

## 2021-09-14 MED ORDER — DOCUSATE SODIUM 100 MG PO CAPS
100.0000 mg | ORAL_CAPSULE | Freq: Two times a day (BID) | ORAL | Status: DC
  Administered 2021-09-14 – 2021-09-16 (×5): 100 mg via ORAL
  Filled 2021-09-14 (×5): qty 1

## 2021-09-14 NOTE — Progress Notes (Signed)
Inpatient Diabetes Program Recommendations ? ?Diabetes Treatment Program Recommendations ? ?ADA Standards of Care ?Diabetes in Pregnancy Target Glucose Ranges: ? ?Fasting: 70 - 95 mg/dL ?1 hr postprandial: Less than '140mg'$ /dL (from first bite of meal) ?2 hr postprandial: Less than 120 mg/dL (from first bite of meal)   ? ? Latest Reference Range & Units 09/14/21 00:06 09/14/21 06:01 09/14/21 09:56  ?Glucose-Capillary 70 - 99 mg/dL 142 (H) 116 (H) 109 (H)  ? ? Latest Reference Range & Units 09/13/21 12:05 09/13/21 12:57 09/13/21 16:26 09/13/21 16:50 09/13/21 17:59  ?Glucose-Capillary 70 - 99 mg/dL 138 (H) 81 69 (L) 74 86  ? ? Latest Reference Range & Units 09/13/21 05:27 09/13/21 11:36  ?Glucose-Capillary 70 - 99 mg/dL 79 62 (L)  ? ?Review of Glycemic Control ? ?Diabetes history: GDM; [redacted]W[redacted]D gestation ?Outpatient Diabetes medications: NPH 20 units QAM, NPH 10 units QPM, Humalog 10 units with breakfast and supper ?Current orders for Inpatient glycemic control: Novolog 0-10 units TID (2 hour post prandial) ? ?Inpatient Diabetes Program Recommendations:   ? ?Insulin: Noted Novolog custom scale ordered this morning. ? ?NOTE: Noted consult for Diabetes Coordinator. Diabetes Coordinator is not on campus over the weekend but available by pager from 8am to 5pm for questions or concerns. Chart reviewed. Noted patient had right inferior minimally invasive parathyroidectomy on 09/13/21 and was given Decadron 5 mg at 15:19 which will likely contribute to hyperglycemia. Fasting glucose 116 mg/dl at 6:01 this am. Agree with insulin orders. Will follow glucose trends. ? ?Thanks, ?Barnie Alderman, RN, MSN, CDE ?Diabetes Coordinator ?Inpatient Diabetes Program ?[phone] (Team Pager from 8am to 5pm) ? ? ? ? ?

## 2021-09-14 NOTE — Progress Notes (Signed)
Daily Antepartum Note  ?Admission Date: 09/11/2021 ?Current Date: 09/14/2021 ?9:07 AM ? ?Theresa Mccoy is a 41 y.o. [id number] @ 30w6dadmitted for hypoglycemia in the setting of GDMa2 on insulin. POD#1 right inferior parathyroidectomy for hypercalcemia and adenoma ? ?Pregnancy complicated by: ?Patient Active Problem List  ? Diagnosis Date Noted  ? Hypoglycemia associated with gestational diabetes ([facility] [place] 09/12/2021  ? Anemia in pregnancy, second trimester 09/12/2021  ? Acquired hypothyroidism 08/12/2021  ? Pelvic adhesive disease 08/05/2021  ? Chronic hypertension affecting pregnancy 08/05/2021  ? Hypothyroidism 07/19/2021  ? Gestational diabetes mellitus (GDM) affecting pregnancy, antepartum 07/10/2021  ? Supervision of high risk pregnancy, antepartum 06/21/2021  ? Previous cesarean delivery x 2 affecting pregnancy, antepartum 06/21/2021  ? S/P small bowel resection 06/21/2021  ? H/O unilateral salpingectomy 06/21/2021  ? Severe bipolar I disorder, current or most recent episode depressed ([facility] 04/05/2020  ? AKI (acute kidney injury) ([facility] 01/22/2020  ? Adnexal mass 01/22/2020  ? Renal calculus, right 01/22/2020  ? Hypercalcemia 07/07/2019  ? Primary hyperparathyroidism ([facility] 07/07/2019  ? UTI in pregnancy 07/02/2019  ? History of small bowel obstruction 07/02/2019  ? Muscle cramps 02/02/2017  ? Bipolar disorder, current episode mixed, moderate ([facility] 02/02/2017  ? Generalized abdominal pain 02/02/2017  ? Generalized anxiety disorder 02/02/2017  ? Essential hypertension 02/02/2017  ? ? ?Overnight/24hr events:  ?none ? ?Subjective:  ?Patient feeling well today. No OB complaints ? ?Objective:  ? ? Current Vital Signs 24h Vital Sign Ranges  ?T 97.9 ?F (36.6 ?C) Temp  Avg: 97.9 ?F (36.6 ?C)  Min: 96.8 ?F (36 ?C)  Max: 98.6 ?F (37 ?C)  ?BP (!) 98/55 ? BP  Min: 90/54  Max: 116/77  ?HR 70 Pulse  Avg: 72.1  Min: 65  Max: 78  ?RR 18 Resp  Avg: 18.1  Min: 15  Max: 23  ?SaO2 100 % Room Air SpO2  Avg: 99.3 %  Min: 95 %  Max: 100 %   ?    ? 24 Hour I/O Current Shift I/O  ?Time ?Ins ?Outs 03/10 0701 - 03/11 0700 ?In: 3257.6 [P.O.:420; I.V.:2637.6] ?Out: 220 [Urine:200] No intake/output data recorded.  ?Fetal Heart Tones: 140 baseline, +accels, no decel, mod variability ?Tocometry: quiet ? ?Physical exam: ?General: Well nourished, well developed female in no acute distress. ?Abdomen: gravid nttp ?Cardiovascular: S1, S2 normal, no murmur, rub or gallop, regular rate and rhythm ?Respiratory: CTAB ?Extremities: no clubbing, cyanosis or edema ?Skin: Warm and dry.  ? ?Medications: ?Current Facility-Administered Medications  ?Medication Dose Route Frequency Provider Last Rate Last Admin  ? acetaminophen (TYLENOL) tablet 650 mg  650 mg Oral Q4H PRN GArmandina Gemma MD   650 mg at 09/14/21 04270 ? dextrose 5 %-0.45 % sodium chloride infusion   Intravenous Continuous GArmandina Gemma MD 125 mL/hr at 09/14/21 0202 New Bag at 09/14/21 0202  ? docusate sodium (COLACE) capsule 100 mg  100 mg Oral BID PAletha Halim MD      ? insulin aspart (novoLOG) injection 0-10 Units  0-10 Units Subcutaneous TID PRia Clock MD      ? levothyroxine (SYNTHROID) tablet 75 mcg  75 mcg Oral Once per day on Mon Tue Wed Thu Fri Sat GArmandina Gemma MD   75 mcg at 09/14/21 06237 ? ? ?Labs:  ?Recent Labs  ?Lab 09/11/21 ?2310 09/12/21 ?0443  ?WBC 13.3* 12.0*  ?HGB 9.9* 9.2*  ?HCT 29.4* 28.3*  ?PLT 287 254  ? ? ?Recent Labs  ?Lab 09/12/21 ?0443 09/13/21 ?0454 09/14/21 ?  0425  ?NA 137 135 134*  ?K 4.4 4.1 4.1  ?CL 113* 114* 112*  ?CO2 19* 17* 17*  ?BUN 5* 7 9  ?CREATININE 1.07* 1.31* 1.31*  ?CALCIUM 12.0* 11.5* 10.1  ?PROT 5.3* 4.9* 4.1*  ?BILITOT <0.1* <0.1* 0.2*  ?ALKPHOS 98 88 80  ?ALT '13 11 10  '$ ?AST 14* 13* 12*  ?GLUCOSE 80 84 124*  ? ? ?Radiology:  ?No new imaging ? ?Assessment & Plan:  ?Pt doing well ?*Pregnancy: reactive NST. Continue with qday NSTs ?*Hypercalcemia: seen by [facility]SU and no issues. Plan for tums at d/c per their dosing. Ca wnl today ?*GDMa2: restart DM diet, am  fasting with 2h post prandial and qhs CBG checks with [facility]SI. DM education recs appreciated ?*Psych: following. Appreciate recs.  ?*UTI: tx with rocephin x 3d. UCx neg on admit ?*Renal: stable; continue qday labs ?*Thyroid: continue synthroid. Qmonth surveillance TFTs ordered ?*Anemia: f/u panel ?*Chronic HTN: no issues ?*Preterm: no issues ?*PPx: SCDs ?*FEN/GI: DM diet, NS MIVF for today ?*Dispo: likely monday ? ?Durene Romans MD ?Attending ?[facility] [employer] Fish farm manager) ?GYN Consult Phone: [phone] (M-F, [phone]) & [phone]  (Off hours, weekends, holidays) ? ?

## 2021-09-14 NOTE — Progress Notes (Addendum)
? ? ?1 Day Post-Op  ?Subjective: ?CC: ?No complaints this am. Sitting up in bed watching a show. Denies pain at incision, swelling at incision, difficulty swallowing, hoarseness, or other complaints. Tolerating diet without n/v. No abd pain or vag bleeding reported.  ? ?Objective: ?Vital signs in last 24 hours: ?Temp:  [96.8 ?F (36 ?C)-98.6 ?F (37 ?C)] 97.9 ?F (36.6 ?C) (03/11 0325) ?Pulse Rate:  [65-80] 70 (03/11 0325) ?Resp:  [15-23] 18 (03/11 0325) ?BP: (90-116)/(54-80) 94/60 (03/11 0325) ?SpO2:  [95 %-100 %] 98 % (03/11 0325) ?Weight:  [55.3 kg] 55.3 kg (03/10 1322) ?Last BM Date :  (pt could not remember exact date, before admission) ? ?Intake/Output from previous day: ?03/10 0701 - 03/11 0700 ?In: 3257.6 [P.O.:420; I.V.:2637.6; IV Piggyback:200] ?Out: 220 [Urine:200; Blood:20] ?Intake/Output this shift: ?No intake/output data recorded. ? ?PE: ?Gen:  Alert, NAD, pleasant ?Neck: Neck incision with glue intact appears well and are without drainage, bleeding, underlying hematoma or signs of infection ?Pulm: rate and effort normal ?Abd: Soft, gravid, NT ?Psych: A&Ox3  ?Skin: no rashes noted, warm and dry ? ?Lab Results:  ?Recent Labs  ?  09/11/21 ?2310 09/12/21 ?0443  ?WBC 13.3* 12.0*  ?HGB 9.9* 9.2*  ?HCT 29.4* 28.3*  ?PLT 287 254  ? ?BMET ?Recent Labs  ?  09/13/21 ?0454 09/14/21 ?0425  ?NA 135 134*  ?K 4.1 4.1  ?CL 114* 112*  ?CO2 17* 17*  ?GLUCOSE 84 124*  ?BUN 7 9  ?CREATININE 1.31* 1.31*  ?CALCIUM 11.5* 10.1  ? ?PT/INR ?No results for input(s): LABPROT, INR in the last 72 hours. ?CMP  ?   ?Component Value Date/Time  ? NA 134 (L) 09/14/2021 0425  ? NA 135 08/05/2021 1119  ? K 4.1 09/14/2021 0425  ? CL 112 (H) 09/14/2021 0425  ? CO2 17 (L) 09/14/2021 0425  ? GLUCOSE 124 (H) 09/14/2021 0425  ? BUN 9 09/14/2021 0425  ? BUN 8 08/05/2021 1119  ? CREATININE 1.31 (H) 09/14/2021 0425  ? CALCIUM 10.1 09/14/2021 0425  ? CALCIUM 13.5 ([place]) 05/26/2021 0304  ? PROT 4.1 (L) 09/14/2021 0425  ? PROT 6.0 08/05/2021 1119  ?  ALBUMIN 1.7 (L) 09/14/2021 0425  ? ALBUMIN 3.4 (L) 08/05/2021 1119  ? AST 12 (L) 09/14/2021 0425  ? ALT 10 09/14/2021 0425  ? ALKPHOS 80 09/14/2021 0425  ? BILITOT 0.2 (L) 09/14/2021 0425  ? BILITOT <0.2 08/05/2021 1119  ? GFRNONAA 52 (L) 09/14/2021 0425  ? GFRAA 127 02/07/2020 1551  ? ?Lipase  ?   ?Component Value Date/Time  ? LIPASE 34 05/25/2021 2135  ? ? ?Studies/Results: ?No results found. ? ?Anti-infectives: ?Anti-infectives (From admission, onward)  ? ? Start     Dose/Rate Route Frequency Ordered Stop  ? 09/13/21 1400  ceFAZolin (ANCEF) IVPB 2g/100 mL premix       ? 2 g ?200 mL/hr over 30 Minutes Intravenous On call to O.R. 09/13/21 1308 09/13/21 1538  ? 09/13/21 1000  cefTRIAXone (ROCEPHIN) 2 g in sodium chloride 0.9 % 100 mL IVPB  Status:  Discontinued       ?See Hyperspace for full Linked Orders Report.  ? 2 g ?200 mL/hr over 30 Minutes Intravenous Every 24 hours 09/12/21 0238 09/13/21 0824  ? 09/13/21 0600  ceFAZolin (ANCEF) IVPB 2g/100 mL premix  Status:  Discontinued       ? 2 g ?200 mL/hr over 30 Minutes Intravenous On call to O.R. 09/12/21 1451 09/13/21 1310  ? 09/12/21 1000  cefTRIAXone (ROCEPHIN) 1  g in sodium chloride 0.9 % 100 mL IVPB       ?See Hyperspace for full Linked Orders Report.  ? 1 g ?200 mL/hr over 30 Minutes Intravenous  Once 09/12/21 0238 09/12/21 1104  ? 09/12/21 0045  cefTRIAXone (ROCEPHIN) 1 g in sodium chloride 0.9 % 100 mL IVPB       ? 1 g ?200 mL/hr over 30 Minutes Intravenous  Once 09/12/21 0040 09/12/21 0144  ? ?  ? ? ? ?Assessment/Plan ?POD 1 s/p  right inferior minimally invasive parathyroidectomy for Primary hyperparathyroidism by Dr. Harlow Asa 09/13/21 ?- Frozen section confirmed hypercellular parathyroid tissue consistent with adenoma. ?- Ca 10.1 on CMP today ?- Incision c/d/i ?- We will follow peripherally moving forward. Discussed with attending - recommend '2500mg'$  Tums BID for calcium supplementation. Dr. Harlow Asa is already arranging follow up. Please call back sooner with  any questions or concerns.  ? ? LOS: 2 days  ? ? ?Jillyn Ledger , PA-C ?[facility] ?09/14/2021, 7:34 AM ?Please see Amion for pager number during day hours 7:00am-4:30pm ? ?

## 2021-09-14 NOTE — Discharge Instructions (Signed)
[facility] - Dr. Armandina Gemma ? ?THYROID & PARATHYROID SURGERY:  POST-OP INSTRUCTIONS ? ?Always review the instruction sheet provided by the hospital nurse at discharge. ? ?Take your pain medication as prescribed.  If narcotic pain medicine is not needed, then you may take acetaminophen (Tylenol) as needed for pain or soreness. ? ?Take your normal home medications as prescribed unless otherwise directed. ? ?If you need a refill on your pain medication, please contact the office during regular business hours.  Prescriptions will not be processed by the office after 5:00PM or on weekends. ? ?Start with a light diet upon arrival home, such as soup and crackers or toast.  Be sure to drink plenty of fluids.  Resume your normal diet the day after surgery. ? ?Most patients will experience some swelling and bruising on the chest and neck area.  Ice packs will help for the first 48 hours after arriving home.  Swelling and bruising will take several days to resolve.  ? ?It is common to experience some constipation after surgery.  Increasing fluid intake and taking a stool softener (Colace) will usually help to prevent this problem.  A mild laxative (Milk of Magnesia or Miralax) should be taken according to package directions if there has been no bowel movement after 48 hours. ? ?Dermabond glue covers your incision. This seals the wound and you may shower at any time. The Dermabond will remain in place for about a week.  You may gradually remove the glue when it loosens around the edges.  If you need to loosen the Dermabond for removal, apply a layer of Vaseline to the wound for 15 minutes and then remove with a Kleenex. Your sutures are under the skin and will not show - they will dissolve on their own. ? ?You may resume light daily activities beginning the day after discharge (such as self-care, walking, climbing stairs), gradually increasing activities as tolerated. You may have sexual intercourse when it is  comfortable. Refrain from any heavy lifting or straining until approved by your doctor. You may drive when you no longer are taking prescription pain medication, you can comfortably wear a seatbelt, and you can safely maneuver your car and apply the brakes. ? ?You will see your doctor in the office for a follow-up appointment approximately three weeks after your surgery.  Make sure that you call for this appointment within a day or two after you arrive home to insure a convenient appointment time. Please have any requested laboratory tests performed a few days prior to your office visit so that the results will be available at your follow up appointment. ? ?WHEN TO CALL THE [facility] OFFICE: ?-- Fever greater than 101.5 ?-- Inability to urinate ?-- Nausea and/or vomiting - persistent ?-- Extreme swelling or bruising ?-- Continued bleeding from incision ?-- Increased pain, redness, or drainage from the incision ?-- Difficulty swallowing or breathing ?-- Muscle cramping or spasms ?-- Numbness or tingling in hands or around lips ? ?The clinic staff is available to answer your questions during regular business hours.  Please don?t hesitate to call and ask to speak to one of the nurses if you have concerns. ? ?[facility] OFFICE: [phone] (24 hours) ? ?Please sign up for MyChart accounts. This will allow you to communicate directly with my nurse or myself without having to call the office. It will also allow you to view your test results. You will need to enroll in MyChart for my office ([facility]) and for the hospital (  [facility]). ? ? ?

## 2021-09-15 LAB — URINALYSIS, ROUTINE W REFLEX MICROSCOPIC
Bilirubin Urine: NEGATIVE
Glucose, UA: NEGATIVE mg/dL
Hgb urine dipstick: NEGATIVE
Ketones, ur: NEGATIVE mg/dL
Nitrite: NEGATIVE
Protein, ur: NEGATIVE mg/dL
Specific Gravity, Urine: 1.011 (ref 1.005–1.030)
pH: 6 (ref 5.0–8.0)

## 2021-09-15 LAB — COMPREHENSIVE METABOLIC PANEL
ALT: 9 U/L (ref 0–44)
AST: 13 U/L — ABNORMAL LOW (ref 15–41)
Albumin: 1.7 g/dL — ABNORMAL LOW (ref 3.5–5.0)
Alkaline Phosphatase: 73 U/L (ref 38–126)
Anion gap: 3 — ABNORMAL LOW (ref 5–15)
BUN: 14 mg/dL (ref 6–20)
CO2: 19 mmol/L — ABNORMAL LOW (ref 22–32)
Calcium: 9.6 mg/dL (ref 8.9–10.3)
Chloride: 114 mmol/L — ABNORMAL HIGH (ref 98–111)
Creatinine, Ser: 1.43 mg/dL — ABNORMAL HIGH (ref 0.44–1.00)
GFR, Estimated: 47 mL/min — ABNORMAL LOW (ref >60)
Glucose, Bld: 83 mg/dL (ref 70–99)
Potassium: 3.8 mmol/L (ref 3.5–5.1)
Sodium: 136 mmol/L (ref 135–145)
Total Bilirubin: 0.1 mg/dL — ABNORMAL LOW (ref 0.3–1.2)
Total Protein: 4.4 g/dL — ABNORMAL LOW (ref 6.5–8.1)

## 2021-09-15 LAB — SODIUM, URINE, RANDOM: Sodium, Ur: 60 mmol/L

## 2021-09-15 LAB — CREATININE, URINE, RANDOM: Creatinine, Urine: 91.06 mg/dL

## 2021-09-15 LAB — GLUCOSE, CAPILLARY
Glucose-Capillary: 75 mg/dL (ref 70–99)
Glucose-Capillary: 86 mg/dL (ref 70–99)
Glucose-Capillary: 89 mg/dL (ref 70–99)
Glucose-Capillary: 102 mg/dL — ABNORMAL HIGH (ref 70–99)
Glucose-Capillary: 95 mg/dL (ref 70–99)

## 2021-09-15 LAB — T4: T4, Total: 13.2 ug/dL — ABNORMAL HIGH (ref 4.5–12.0)

## 2021-09-15 MED ORDER — HALOPERIDOL 1 MG PO TABS
2.0000 mg | ORAL_TABLET | Freq: Every day | ORAL | Status: DC
  Administered 2021-09-15: 2 mg via ORAL
  Filled 2021-09-15 (×2): qty 2

## 2021-09-15 MED ORDER — SODIUM CHLORIDE 0.9 % IV SOLN: INTRAVENOUS | Status: DC

## 2021-09-15 MED ORDER — CALCIUM CARBONATE ANTACID 500 MG PO CHEW
5.0000 | CHEWABLE_TABLET | Freq: Two times a day (BID) | ORAL | Status: DC
  Administered 2021-09-15 – 2021-09-16 (×3): 1000 mg via ORAL
  Filled 2021-09-15 (×3): qty 5

## 2021-09-15 MED ORDER — SODIUM CHLORIDE 0.9 % IV BOLUS
2000.0000 mL | Freq: Once | INTRAVENOUS | Status: AC
  Administered 2021-09-15: 2000 mL via INTRAVENOUS

## 2021-09-15 MED ORDER — BACITRACIN ZINC 500 UNIT/GM EX OINT
TOPICAL_OINTMENT | Freq: Two times a day (BID) | CUTANEOUS | Status: DC | PRN
  Administered 2021-09-15: 1 via TOPICAL
  Filled 2021-09-15: qty 28.4

## 2021-09-15 NOTE — Anesthesia Postprocedure Evaluation (Signed)
Anesthesia Post Note ? ?Patient: Theresa Mccoy ? ?Procedure(s) Performed: RIGHT INFERIOR PARATHYROIDECTOMY (Right: Neck) ? ?  ? ?Patient location during evaluation: PACU ?Anesthesia Type: General ?Level of consciousness: awake and alert ?Pain management: pain level controlled ?Vital Signs Assessment: post-procedure vital signs reviewed and stable ?Respiratory status: spontaneous breathing, nonlabored ventilation, respiratory function stable and patient connected to nasal cannula oxygen ?Cardiovascular status: blood pressure returned to baseline and stable ?Postop Assessment: no apparent nausea or vomiting ?Anesthetic complications: no ? ? ?No notable events documented. ? ?Last Vitals:  ?Vitals:  ? 09/15/21 0837 09/15/21 1144  ?BP: 94/60 107/75  ?Pulse: 80 75  ?Resp: 17 18  ?Temp: 36.5 ?C 36.7 ?C  ?SpO2: 98% 97%  ?  ?Last Pain:  ?Vitals:  ? 09/15/21 1144  ?TempSrc: Oral  ?PainSc:   ? ? ?  ?  ?  ?  ?  ?  ? ?[place] ? ? ? ? ?

## 2021-09-15 NOTE — Consult Note (Signed)
[facility] Face-to-Face Psychiatric Evaluation   Service Date: September 15, 2021 LOS:  LOS: 3 days    Assessment  Theresa Mccoy is a 40 y.o. female admitted medically for 09/11/2021 10:56 PM for hypoglycemia in pregnancy. She carries the psychiatric diagnoses of alleged bipolar disorder and PTSD and has a past medical history of  hyperthyroidism and hyperparathyroidism.Psychiatry was consulted for "severe bipolar disorder" by Aletha Halim, MD.    Patient was seen in the morning.  Patient is awake, alert, and oriented x4.  She has the information provided by Dr. Lovette Cliche regarding medications for mood stabilizing medication during pregnancy.  She states that she has rebounded and discussed with her husband, and requests to do option 3, Haldol.  She states that I want to be able to control my mood swings.  She describes "feeling all of my emotions at the same time" and finds this to be overwhelming.  She states that she finds it difficult to think and control her thoughts and she may say "bad things to her husband" which she later regrets.  Patient also endorses auditory hallucinations of "tapping sounds".  Patient complains of shaking, and questions who she can see for this.  She reports that the only thing that can control her shaking is smoking marijuana.  She states that she does not inhale marijuana, and has not found it to be helpful.  She does state that she has been continuing to smoke cigarettes, but does not smoke a whole cigarette at a time.  She is in a contemplative stage about marijuana and tobacco smoking cessation.  Patient states that she has been living with her husband.  Her older children ages 18, and 43 are out of the house.  She is excited about her baby boy which she will plan to deliver at 39 weeks, EDC 20 June. Patient states that she has mental health follow-up through her OB/GYN clinic, and is expected to have a video call tomorrow with her mental health  provider. Patient is agreeable to starting antipsychotic.  She is agreeable to follow up with an outpatient psychiatrist.   From initial psychiatric intake: Overall, she was a poor historian and often had to ask questions multiple times in different ways to get an answer. It is difficult to establish a narrative history of her illness however appears to have been diagnosed with bipolar disorder at the age of 23 due to significant irritability. She endorses some symptoms consistent with hypomanic episodes (1-2 days of poor sleep with irritability) however did not endorse any history of psychiatric hospitalizations or a history of full manic episodes; this is consistent with prior psychiatric notes that have been reviewed. Did endorse recent auditory hallucinations and appeared to have some mild thought disorganization and parsed phrases oddly throughout exam. She has a history of pseudocyesis with multiple emergency room visits for this reason (is actually pregnant at time of exam). Will need to gather more collateral from husband; overall picture appears to be more consistent with psychotic spectrum disorder (or bipolar II vs MDD with psychotic features/delusional disorder) than true bipolar I disorder. She did have a psychiatric hospitalization in 2020 (denied any psych hospitalizations on exam) in which she was diagnosed with MDD with psychotic features which is generally consistent with above assessment. She was discharged from that hospitalization on a risperidone LAI with noted improvement in outbursts and irritability. She endorses an history of trauma as a child and suffers from hypervigilance, nightmares, and flashbacks; has previously been  diagnosed with PTSD which I generally agree with. She also has both hyperthyroidism and hyperparathyroidism which will worsen underlying psychiatric disorders (psych hx does appear to predate endocrine abnormalities).  Patient is open to taking psychotropic  medications during pregnancy "if they are safe for her baby". Discussed this topic generally with pt; will hold off on medications for now (building alliance, pt undergoing surgery tomorrow for parathyroid removal, hx prolonged qtc and undergoing anesthesia). Regardless of underlying diagnosis, both pt and review of records suggest she has done well on risperidone and will likely suggest this as monotherapy to minimize polypharmacy and reduce risk of harm to baby.  3/10: Saw pt as below. Left informational pamphlets on haloperidol and risperidone in pregnancy with pt. Attempted to see pt shortly after lunch and unfortunately arrived right after she left for surgery. Based on initial interview and chart review, would favor monotherapy with antipsychotic during pregnancy or starting shortly after delivery (if pt opts for no treatment).    Diagnoses:  Active Hospital problems: Principal Problem:   Hypoglycemia associated with gestational diabetes [facility]) Active Problems:   UTI in pregnancy   Primary hyperparathyroidism ([place])   AKI (acute kidney injury) ([place])   Severe bipolar I disorder, current or most recent episode depressed ([place])   Supervision of high risk pregnancy, antepartum   Previous cesarean delivery x 2 affecting pregnancy, antepartum   Gestational diabetes mellitus (GDM) affecting pregnancy, antepartum   Chronic hypertension affecting pregnancy   Anemia in pregnancy, second trimester     Plan  ## Safety and Observation Level:  - Based on my clinical evaluation, I estimate the patient to be at low risk of self harm in the current setting - At this time, we recommend a routine level of observation. This decision is based on my review of the chart including patient's history and current presentation, interview of the patient, mental status examination, and consideration of suicide risk including evaluating suicidal ideation, plan, intent, suicidal or self-harm behaviors, risk factors, and  protective factors. This judgment is based on our ability to directly address suicide risk, implement suicide prevention strategies and develop a safety plan while the patient is in the clinical setting. Please contact our team if there is a concern that risk level has changed.   ## Medications:  -- Start low dose Haldol 2 mg at bedtime and allow outpatient provider to adjust.  She has some vague AH of tapping sounds, continue to monitor for elimination of tapping sounds as antipsychotic medications are adjusted. - She does still smoke cigarettes, and may need a higher dose after discharge.  I have encouraged her to quit smoking.     ## Medical Decision Making Capacity:  Not formally assessed  ## Further Work-up:  -- consider B12 -- most recent EKG on 09/12/20 had QtC of 400 - She requests referral for her "shaking", saying the only thing that keeps her from shaking is weed- she is currently smoking but not inhaling and says it is not effective now.  Reviewed that this could be effecting her Bipolar illness, and recommended not using marijuana.   ## Disposition:  -- Does not appear to require psychiatric hospitalization when medically cleared - Recommend TOC consult to ensure patient has outpatient psychiatric follow-up.  Thank you for this consult request. Recommendations have been communicated to the primary team.  We will continue to follow at this time.   Lavella Hammock, MD  Total Time Spent in Direct Patient Care:  I personally spent  40 minutes on the unit in direct patient care. The direct patient care time included face-to-face time with the patient, reviewing the patient's chart, communicating with other professionals, and coordinating care. Greater than 50% of this time was spent in counseling or coordinating care with the patient regarding goals of hospitalization, psycho-education, and discharge planning needs.    History  Relevant Aspects of Hospital Course:  Admitted on  09/11/2021 for hypoglycemia; new dx diabetes with this pregnancy.  Patient Report:   ROS:  Pt seen in mid-AM. She is alert and oriented. She denies SI, HI, AH/VH.  Reviewed r/b/se of treatment during pregnancy outlaying 3 main options patient is to start Haldol.  She states she has discussed this with him and agreement with  1) No treatment  - risk of worse mood and anger especially in postpartum period  - no medication exposure to baby 2) Risperidone  - known efficacy for this pt  - less data in pregnancy than haloperidol 3) Haloperidol  - unknown efficacy in this pt  - more safety data in pregnancy than risperidone.    Collateral information:  Patient discussed husband.  Psychiatric History:  Information collected from patient, medical record No hx psychiatric hospitalizations.   In 2022 was on risperidone 1 mg AM 2 mg QHS, trazodone 50 mg, gabapentin 300 mg TID, hydroxyzine 10 TID.   Family psych history: No known history   Social History:  No EtOH, did drink before pregnancy. Would drink socially. Smokes cigarettes but has cut back now that she is pregnant. Smokes marijuana to calm her nerves.   Graduated 10th grade, no hx special ed  Family History:  The patient's family history includes Asthma in her paternal grandmother; Diabetes in her brother.  Medical History: Past Medical History:  Diagnosis Date   AKI (acute kidney injury) ([place]) 01/22/2020   Anemia    Depression    Diabetes mellitus without complication (HCC)    Hypernatremia 01/22/2020   Hyperthyroidism    PTSD (post-traumatic stress disorder)    SBO (small bowel obstruction) ([place]) 07/07/2019   Sepsis ([place]) 01/22/2020    Surgical History: Past Surgical History:  Procedure Laterality Date   BOWEL RESECTION N/A 07/08/2019   Procedure: Small Bowel Resection;  Surgeon: Clovis Riley, MD;  Location: [facility];  Service: General;  Laterality: N/A;   CESAREAN SECTION     DILATION AND CURETTAGE OF UTERUS      HAND SURGERY Left    LAPAROTOMY N/A 07/08/2019   Procedure: EXPLORATORY LAPAROTOMY;  Surgeon: Clovis Riley, MD;  Location: [facility];  Service: General;  Laterality: N/A;   OOPHORECTOMY Right    SALPINGECTOMY      Medications:   Current Facility-Administered Medications:    acetaminophen (TYLENOL) tablet 650 mg, 650 mg, Oral, Q4H PRN, Armandina Gemma, MD, 650 mg at 09/14/21 2147   bacitracin ointment, , Topical, BID PRN, Aletha Halim, MD, 1 application. at 09/15/21 1031   calcium carbonate (TUMS - dosed in mg elemental calcium) chewable tablet 1,000 mg of elemental calcium, 5 tablet, Oral, BID, Maczis, Barth Kirks, PA-C, 1,000 mg of elemental calcium at 09/15/21 1038   docusate sodium (COLACE) capsule 100 mg, 100 mg, Oral, BID, Aletha Halim, MD, 100 mg at 09/15/21 1038   haloperidol (HALDOL) tablet 2 mg, 2 mg, Oral, QHS, Pickens, Charlie, MD   insulin aspart (novoLOG) injection 0-10 Units, 0-10 Units, Subcutaneous, TID PC, Aletha Halim, MD   levothyroxine (SYNTHROID) tablet 75 mcg, 75 mcg, Oral, Once per day  on Mon Tue Wed Thu Fri Sat, Gerkin, Todd, MD, 75 mcg at 09/14/21 1443  Allergies: Allergies  Allergen Reactions   Codeine Other (See Comments)    Constipation        Objective  Vital signs:  Temp:  [97.7 F (36.5 C)-98.1 F (36.7 C)] 98.1 F (36.7 C) (03/12 1544) Pulse Rate:  [72-90] 72 (03/12 1544) Resp:  [17-18] 17 (03/12 1544) BP: (79-107)/(40-75) 104/62 (03/12 1544) SpO2:  [97 %-100 %] 98 % (03/12 1544)  Psychiatric Specialty Exam:  Presentation  General Appearance: Appropriate for Environment; Casual Eye Contact:Good Speech:Clear and Coherent Speech Volume:Normal Handedness:Right  Mood and Affect  Mood:Euthymic Affect:Appropriate; Congruent  Thought Process  Thought Processes:Linear Descriptions of Associations:Circumstantial  Orientation:Full (Time, Place and Person)  Thought Content:Logical  History of Schizophrenia/Schizoaffective  disorder:No data recorded Duration of Psychotic Symptoms:No data recorded Hallucinations:Hallucinations: Auditory Description of Auditory Hallucinations: Tapping sounds   Ideas of Reference:None  Suicidal Thoughts:Suicidal Thoughts: No   Homicidal Thoughts:Homicidal Thoughts: No    Sensorium  Memory:Immediate Fair; Recent Fair; Remote [place] Insight:Fair  Executive Functions  Concentration:Good Attention Span:Good [place] Language:Good  Psychomotor Activity  Psychomotor Activity:Psychomotor Activity: Normal   Assets  Assets:Communication Skills; Desire for Improvement; Housing; Resilience; Social Support  Sleep  Sleep:Sleep: Fair    Physical Exam: Physical Exam HENT:     Head: Normocephalic and atraumatic.  Cardiovascular:     Rate and Rhythm: Normal rate.  Pulmonary:     Effort: Pulmonary effort is normal. No respiratory distress.  Neurological:     General: No focal deficit present.     Mental Status: She is alert and oriented to person, place, and time.  Psychiatric:        Mood and Affect: Mood normal.        Behavior: Behavior normal.   Review of Systems  Constitutional:        Trying to remember to lay on her left side which is more uncomfortable for her.  Psychiatric/Behavioral:  Positive for hallucinations (AH - tapping sounds) and substance abuse (tobacco, ? marijuana). Negative for depression and suicidal ideas. The patient is not nervous/anxious and does not have insomnia.    Blood pressure 104/62, pulse 72, temperature 98.1 F (36.7 C), temperature source Oral, resp. rate 17, height 5' (1.524 m), weight 55.3 kg, SpO2 98 %. Body mass index is 23.81 kg/m.

## 2021-09-15 NOTE — Progress Notes (Signed)
Daily Antepartum Note  ?Admission Date: 09/11/2021 ?Current Date: 09/15/2021 ?9:29 AM ? ?Theresa Mccoy is a 41 y.o. [id number] @ 33w0dadmitted for hypoglycemia in the setting of GDMa2 on insulin. POD#2 right inferior parathyroidectomy for hypercalcemia and adenoma ? ?Pregnancy complicated by: ?Patient Active Problem List  ? Diagnosis Date Noted  ? Hypoglycemia associated with gestational diabetes ([place] 09/12/2021  ? Anemia in pregnancy, second trimester 09/12/2021  ? Acquired hypothyroidism 08/12/2021  ? Pelvic adhesive disease 08/05/2021  ? Chronic hypertension affecting pregnancy 08/05/2021  ? Gestational diabetes mellitus (GDM) affecting pregnancy, antepartum 07/10/2021  ? Supervision of high risk pregnancy, antepartum 06/21/2021  ? Previous cesarean delivery x 2 affecting pregnancy, antepartum 06/21/2021  ? S/P small bowel resection 06/21/2021  ? H/O unilateral salpingectomy 06/21/2021  ? Severe bipolar I disorder, current or most recent episode depressed ([place] 04/05/2020  ? AKI (acute kidney injury) ([place] 01/22/2020  ? Renal calculus, right 01/22/2020  ? Hypercalcemia 07/07/2019  ? Primary hyperparathyroidism ([place] 07/07/2019  ? UTI in pregnancy 07/02/2019  ? History of small bowel obstruction 07/02/2019  ? Bipolar disorder, current episode mixed, moderate ([place] 02/02/2017  ? Generalized anxiety disorder 02/02/2017  ? Essential hypertension 02/02/2017  ? ? ?Overnight/24hr events:  ?none ? ?Subjective:  ?Patient feeling well today. No OB complaints ? ?Objective:  ? ? Current Vital Signs 24h Vital Sign Ranges  ?T 97.7 ?F (36.5 ?C) Temp  Avg: 98 ?F (36.7 ?C)  Min: 97.7 ?F (36.5 ?C)  Max: 98.8 ?F (37.1 ?C)  ?BP 94/60 ? BP  Min: 79/40  Max: 104/59  ?HR 80 Pulse  Avg: 77.1  Min: 70  Max: 90  ?RR 17 Resp  Avg: 17.6  Min: 17  Max: 18  ?SaO2 98 % Room Air SpO2  Avg: 99.6 %  Min: 98 %  Max: 100 %  ?    ? 24 Hour I/O Current Shift I/O  ?Time ?Ins ?Outs 03/11 0701 - 03/12 0700 ?In: -  ?Out: 450 [Urine:450] No intake/output  data recorded.  ?Fetal Heart Tones: 140 baseline, +accels, no decel, mod variability ?Tocometry: quiet ? ?Physical exam: ?General: Well nourished, well developed female in no acute distress. ?HEENT: normal appearing ?Abdomen: gravid nttp ?Cardiovascular: S1, S2 normal, no murmur, rub or gallop, regular rate and rhythm ?Respiratory: CTAB ?Extremities: no clubbing, cyanosis or edema ?Skin: Warm and dry.  ? ?Medications: ?Current Facility-Administered Medications  ?Medication Dose Route Frequency Provider Last Rate Last Admin  ? acetaminophen (TYLENOL) tablet 650 mg  650 mg Oral Q4H PRN GArmandina Gemma MD   650 mg at 09/14/21 2147  ? calcium carbonate (TUMS - dosed in mg elemental calcium) chewable tablet 1,000 mg of elemental calcium  5 tablet Oral BID Maczis, MBarth Kirks PA-C      ? docusate sodium (COLACE) capsule 100 mg  100 mg Oral BID PAletha Halim MD   100 mg at 09/14/21 2146  ? insulin aspart (novoLOG) injection 0-10 Units  0-10 Units Subcutaneous TID PRia Clock MD      ? levothyroxine (SYNTHROID) tablet 75 mcg  75 mcg Oral Once per day on Mon Tue Wed Thu Fri Sat GArmandina Gemma MD   75 mcg at 09/14/21 05093 ? ? ?Labs:  ?Recent Labs  ?Lab 09/13/21 ?0454 09/14/21 ?0425 09/15/21 ?02671 ?NA 135 134* 136  ?K 4.1 4.1 3.8  ?CL 114* 112* 114*  ?CO2 17* 17* 19*  ?BUN '7 9 14  '$ ?CREATININE 1.31* 1.31* 1.43*  ?CALCIUM 11.5* 10.1 9.6  ?PROT  4.9* 4.1* 4.4*  ?BILITOT <0.1* 0.2* 0.1*  ?ALKPHOS 88 80 73  ?ALT '11 10 9  '$ ?AST 13* 12* 13*  ?GLUCOSE 84 124* 83  ? ? ? Latest Reference Range & Units 09/13/21 16:50 09/13/21 17:59 09/14/21 00:06 09/14/21 06:01 09/14/21 09:56 09/14/21 12:47 09/14/21 18:30 09/14/21 22:34 09/15/21 06:25  ?Glucose-Capillary 70 - 99 mg/dL 74 86 142  116 109 110 94 96 75  ? ?Radiology:  ?No new imaging ? ?Assessment & Plan:  ?Pt doing well ?*Pregnancy: reactive NST. Continue with qday NSTs ?*Hypercalcemia: seen by [facility] and no issues. Plan for tums at d/c. Corrected calcium slowly downtrending ?*GDMa2:  sugars are normal and no need for SSI. She is on a DM diet and she said that the food she is getting here is very similar to what she eats at home. I told her to write down what she eats at home and can review with nutrition (consult ordered).  ?Continue AM fasting, 2h post prandial and qhs CBGs and DM diet. Appreciate any DM team recs ?*Psych: following. Appreciate recs.  ?*UTI: tx with rocephin x 3d. UCx neg on admit ?*Renal: nephrology consulted for slowly increasing Cr. Continue qday BMPs ?*Thyroid: continue synthroid. Qmonth surveillance TFTs ordered ?*Anemia: no e/o iron deficiency on panel and patient asymptomatic ?*Chronic HTN: no issues on no meds ?*Preterm: no issues ?*PPx: SCDs, OOB ad lib ?*FEN/GI: DM diet, SLIV ?*Dispo: likely monday ? ?Durene Romans MD ?Attending ?[facility] for [employer]) ?GYN Consult Phone: [phone] (M-F, [phone]) & [phone]  (Off hours, weekends, holidays) ? ?

## 2021-09-15 NOTE — Consult Note (Addendum)
Renal Service ?Consult Note ?[facility] ? ?Theresa Mccoy ?09/15/2021 ?Sol Blazing, MD ?Requesting Physician: Dr Ilda Basset ? ?Reason for Consult: AKI  ?HPI: The patient is a 41 y.o. year-old w/ hx of anemia, depression, DM2, hypothyroid, PTSD who presented to ED 3/9 by EMS for lethargy, low BS. Just diagnosed with DM new onset 2 days prior. Approx 25-[redacted] wks pregnant.  In ED labs showed UA concerning for UTI and she was given IV rocephin. Creat was up at 1.1 (most recently 0.71 on 08/12/21). She rec'd IV D50 for low BS and pt was admitted. While here pt has had known primary hyperparathyroidism w/ +sestamibi scan for a R inferior adenoma in 2021. Recently high Ca of 13.2 and pth 200. Pt missed all her appts to get the adenoma removed so they asked gen surgery to take it out while she is here. Dr Tera Helper did a minimally invasive parathyroidectomy on 3/10 and removed the R inferior adenoma. Pt's creat on the day or surgery 3/10 was up at 1.31, then stable at 1.31 yesterday and today is up to 1.43. BUN is 14 which is up from admission. Asked to see for renal failure.  ? ? ?UA 09/11/21 - turbid, glu 150, 5 ketones, large LE, prot 30, many bact, >50 wbc, 6-20 rbc ? ?   Alb 1.7 - 2.2 here ?   LFT's okay  ?   UOP 500 cc recorded yesterday, 250 cc today.  She has received 3.6 total L IVF"s since admission.  ?   BP's on admission were 90s - 110s. BP's on 3/11 were mostly in the 90's. Today mostly in the 100s.   ?   UCx grew multiple species ?    No contrast, acei/ ARB , no diuretics given here, no BP lowering meds or nsaids ? ? ? Pt denies any current abd/ flank pain, no SOB or CP, no abd pain.  ? ? Reports irregular episodes of vomiting , after burping, but not frequently , 1-2 times per month since pregnant this time.  No OTC meds used in hospital ..  ? ?ROS - denies CP, no joint pain, no HA, no blurry vision, no rash, no diarrhea, no nausea/ vomiting, no dysuria, no difficulty voiding ? ? ?Past Medical  History  ?Past Medical History:  ?Diagnosis Date  ? AKI (acute kidney injury) ([place]) 01/22/2020  ? Anemia   ? Depression   ? Diabetes mellitus without complication ([place])   ? Hypernatremia 01/22/2020  ? Hyperthyroidism   ? PTSD (post-traumatic stress disorder)   ? SBO (small bowel obstruction) ([place]) 07/07/2019  ? Sepsis ([place]) 01/22/2020  ? ?Past Surgical History  ?Past Surgical History:  ?Procedure Laterality Date  ? BOWEL RESECTION N/A 07/08/2019  ? Procedure: Small Bowel Resection;  Surgeon: Clovis Riley, MD;  Location: [place];  Service: General;  Laterality: N/A;  ? CESAREAN SECTION    ? DILATION AND CURETTAGE OF UTERUS    ? HAND SURGERY Left   ? LAPAROTOMY N/A 07/08/2019  ? Procedure: EXPLORATORY LAPAROTOMY;  Surgeon: Clovis Riley, MD;  Location: [place];  Service: General;  Laterality: N/A;  ? OOPHORECTOMY Right   ? SALPINGECTOMY    ? ?Family History  ?Family History  ?Problem Relation Age of Onset  ? Diabetes Brother   ? Asthma Paternal Grandmother   ? ?Social History  reports that she has been smoking cigarettes. She has been smoking an average of .25 packs per day. She has never used smokeless  tobacco. She reports current drug use. Drug: Marijuana. She reports that she does not drink alcohol. ?Allergies  ?Allergies  ?Allergen Reactions  ? Codeine Other (See Comments)  ?  Constipation ?  ? ?Home medications ?Prior to Admission medications   ?Medication Sig Start Date End Date Taking? Authorizing Provider  ?Accu-Chek Softclix Lancets lancets Use four times daily as instructed. 07/23/21   Clarnce Flock, MD  ?aspirin EC 81 MG tablet Take 1 tablet (81 mg total) by mouth daily. Swallow whole. 07/19/21   Clarnce Flock, MD  ?Blood Pressure Monitoring (BLOOD PRESSURE MONITOR AUTOMAT) DEVI 1 Device by Does not apply route daily. Automatic blood pressure cuff with regular cuff. Blood pressure to be monitored regularly at home. ICD-10 code: O09.90. 05/17/19   Donnamae Jude, MD  ?Cholecalciferol (VITAMIN  D3) 50 MCG (2000 UT) capsule Take 1 capsule (2,000 Units total) by mouth daily. 08/13/21   Shamleffer, Melanie Crazier, MD  ?glucose blood test strip Use as instructed 07/23/21   Clarnce Flock, MD  ?insulin lispro (HUMALOG) 100 UNIT/ML injection 10 units before breakfast and before dinner 09/06/21   Anyanwu, Sallyanne Havers, MD  ?insulin NPH Human (HUMULIN N) 100 UNIT/ML injection Inject 20 units before breakfast and 10 units at bedtime 09/04/21   Anyanwu, Sallyanne Havers, MD  ?Insulin Syringes, Disposable, U-100 1 ML MISC Use as directed 09/04/21   Anyanwu, Sallyanne Havers, MD  ?levothyroxine (SYNTHROID) 75 MCG tablet Take 1 tablet (75 mcg total) by mouth daily before breakfast. 07/19/21   Clarnce Flock, MD  ?metFORMIN (GLUCOPHAGE) 1000 MG tablet Take 1 tablet (1,000 mg total) by mouth 2 (two) times daily with a meal. 08/21/21   Clarnce Flock, MD  ?polyethylene glycol (MIRALAX / GLYCOLAX) 17 g packet Take 17 g by mouth 2 (two) times daily. 08/05/21   Aletha Halim, MD  ?Prenatal 27-1 MG TABS Take 1 tablet by mouth daily. ?Patient not taking: Reported on 08/07/2021 06/21/21   Constant, Peggy, MD  ? ? ? ?Vitals:  ? 09/15/21 0454 09/15/21 0837 09/15/21 1144 09/15/21 1544  ?BP: (!) 104/59 94/60 107/75 104/62  ?Pulse: 72 80 75 72  ?Resp: $Remov'18 17 18 17  'WlKBMq$ ?Temp: 97.8 ?F (36.6 ?C) 97.7 ?F (36.5 ?C) 98 ?F (36.7 ?C) 98.1 ?F (36.7 ?C)  ?TempSrc: Oral Oral Oral Oral  ?SpO2: 100% 98% 97% 98%  ?Weight:      ?Height:      ? ?Exam ?Gen alert, no distress ?No rash, cyanosis or gangrene ?Sclera anicteric, throat clear  ?No jvd or bruits ?Chest clear bilat to bases, no rales/ wheezing ?RRR no MRG ?Abd soft ntnd no mass or ascites +bs ?GU defer ?MS no joint effusions or deformity ?Ext no LE or UE edema, no wounds or ulcers ?Neuro is alert, Ox 3 , nf ?   ? ? ? Home meds include - asa, insulin lispro/ nph, synthroid, metformin, miralax prn, vits ? ?    Date   Creat  eGFR ?   2017- 2019  0.70- 0.84 ?   2020   0.73- 1.15 ?   Jan -mar 2021 0.59- 0.78 ?   July  2021  3.06 >> 0.81 AKI episode, 21 - >60 ml'min ?   Nov 2022  1.05 >> 0.94 > 60 ml/min ?   Feb 2023  0.71  105 ?   Sep 11, 2021  1.10 ?   Mar 9  1.07 ?   Mar 10  1.31 ?   Mar 11  1.31 ?   Mar 12  1.43  47  ?    ? ? ? ?Assessment/ Plan: ?Renal failure - b/l creat is normal as of Feb 2023. Hx of moderately severe AKI in 2021. UA c/w infection, no sig proteinuria. No nephrotoxins given here, no contrast / nsaids.  Has been getting IV rocephin then Ancef. AIN possibility. Vol depletion most likely cause given neck surgery would be susceptible to poor po intake. Also at home she has had some vomiting but underplayed perhaps how often, she did have ketones on her admit UA which is suggestive of poor po intake.  BP's are soft, but this may be her baseline and she doesn't appear septic. Recommend 2 L bolus NS and run NS IVF's at 75 cc/hr overnight. If safe to do renal US of her native kidneys, please order a renal US. Will repeat UA and get urine lytes. We will follow.  ?SP parathyroid adenoma resection - by gen surgery on 3/10 ?Hypoglycemia - reason for admission, shortly after starting insulin a few days prior for new onset dx of DM. Low BS resolved.  ?Hypothyroidism ?IUP - 26 wks ?  ? ? ? ?Kelly Splinter  MD ?09/15/2021, 7:01 PM ?Recent Labs  ?Lab 09/11/21 ?2310 09/12/21 ?0443 09/13/21 ?0454 09/14/21 ?0425 09/15/21 ?8841  ?HGB 9.9* 9.2*  --   --   --   ?ALBUMIN  --  2.2*   < > 1.7* 1.7*  ?CALCIUM 12.1* 12.0*   < > 10.1 9.6  ?CREATININE 1.10* 1.07*   < > 1.31* 1.43*  ?K 3.7 4.4   < > 4.1 3.8  ? < > = values in this interval not displayed.  ? ? ?

## 2021-09-16 ENCOUNTER — Inpatient Hospital Stay (HOSPITAL_COMMUNITY): Payer: Commercial Managed Care - HMO

## 2021-09-16 ENCOUNTER — Encounter (HOSPITAL_COMMUNITY): Payer: Self-pay | Admitting: Surgery

## 2021-09-16 ENCOUNTER — Ambulatory Visit: Payer: Medicaid Other | Admitting: Clinical

## 2021-09-16 ENCOUNTER — Other Ambulatory Visit (HOSPITAL_COMMUNITY): Payer: Self-pay

## 2021-09-16 DIAGNOSIS — F314 Bipolar disorder, current episode depressed, severe, without psychotic features: Secondary | ICD-10-CM

## 2021-09-16 LAB — COMPREHENSIVE METABOLIC PANEL
ALT: 11 U/L (ref 0–44)
AST: 18 U/L (ref 15–41)
Albumin: 1.6 g/dL — ABNORMAL LOW (ref 3.5–5.0)
Alkaline Phosphatase: 69 U/L (ref 38–126)
Anion gap: 5 (ref 5–15)
BUN: 16 mg/dL (ref 6–20)
CO2: 17 mmol/L — ABNORMAL LOW (ref 22–32)
Calcium: 8.6 mg/dL — ABNORMAL LOW (ref 8.9–10.3)
Chloride: 116 mmol/L — ABNORMAL HIGH (ref 98–111)
Creatinine, Ser: 1.45 mg/dL — ABNORMAL HIGH (ref 0.44–1.00)
GFR, Estimated: 46 mL/min — ABNORMAL LOW (ref >60)
Glucose, Bld: 85 mg/dL (ref 70–99)
Potassium: 3.8 mmol/L (ref 3.5–5.1)
Sodium: 138 mmol/L (ref 135–145)
Total Bilirubin: 0.4 mg/dL (ref 0.3–1.2)
Total Protein: 4 g/dL — ABNORMAL LOW (ref 6.5–8.1)

## 2021-09-16 LAB — CBC
HCT: 21.3 % — ABNORMAL LOW (ref 36.0–46.0)
Hemoglobin: 7.2 g/dL — ABNORMAL LOW (ref 12.0–15.0)
MCH: 32.6 pg (ref 26.0–34.0)
MCHC: 33.8 g/dL (ref 30.0–36.0)
MCV: 96.4 fL (ref 80.0–100.0)
Platelets: 194 10*3/uL (ref 150–400)
RBC: 2.21 MIL/uL — ABNORMAL LOW (ref 3.87–5.11)
RDW: 13.4 % (ref 11.5–15.5)
WBC: 9.3 10*3/uL (ref 4.0–10.5)
nRBC: 0 % (ref 0.0–0.2)

## 2021-09-16 LAB — GLUCOSE, CAPILLARY
Glucose-Capillary: 77 mg/dL (ref 70–99)
Glucose-Capillary: 85 mg/dL (ref 70–99)
Glucose-Capillary: 95 mg/dL (ref 70–99)

## 2021-09-16 LAB — SURGICAL PATHOLOGY

## 2021-09-16 MED ORDER — LEVOTHYROXINE SODIUM 50 MCG PO TABS
50.0000 ug | ORAL_TABLET | Freq: Every day | ORAL | 1 refills | Status: DC
  Filled 2021-09-16: qty 30, 30d supply, fill #0

## 2021-09-16 MED ORDER — CALCIUM CARBONATE ANTACID 500 MG PO CHEW: 5.0000 | CHEWABLE_TABLET | Freq: Two times a day (BID) | ORAL | Status: DC

## 2021-09-16 MED ORDER — LEVOTHYROXINE SODIUM 75 MCG PO TABS
75.0000 ug | ORAL_TABLET | Freq: Every day | ORAL | 5 refills | Status: DC
  Filled 2021-09-16: qty 30, 30d supply, fill #0

## 2021-09-16 MED ORDER — SODIUM CHLORIDE 0.9 % IV SOLN
500.0000 mg | Freq: Once | INTRAVENOUS | Status: AC
  Administered 2021-09-16: 500 mg via INTRAVENOUS
  Filled 2021-09-16: qty 25

## 2021-09-16 MED ORDER — LEVOTHYROXINE SODIUM 25 MCG PO TABS: 50.0000 ug | ORAL_TABLET | Freq: Every day | ORAL | Status: DC

## 2021-09-16 MED ORDER — HALOPERIDOL 1 MG PO TABS
2.0000 mg | ORAL_TABLET | Freq: Every day | ORAL | 0 refills | Status: DC
  Filled 2021-09-16: qty 60, 30d supply, fill #0

## 2021-09-16 MED ORDER — ACETAMINOPHEN 325 MG PO TABS: 650.0000 mg | ORAL_TABLET | ORAL | Status: DC | PRN

## 2021-09-16 MED ORDER — DIPHENHYDRAMINE HCL 25 MG PO CAPS
25.0000 mg | ORAL_CAPSULE | Freq: Four times a day (QID) | ORAL | Status: DC | PRN
  Administered 2021-09-16: 25 mg via ORAL
  Filled 2021-09-16: qty 1

## 2021-09-16 NOTE — Progress Notes (Signed)
CSW scheduled Psy virtual appointment for patient at the [facility] for September 26, 2021 at 12pm.. Pt was informed of virtual appointment and completed requested intake documents.  CSW faxed intake documents and received fax confirmation.  ? ?There are no barriers to patient's discharge.  ? ?Laurey Arrow, MSW, LCSW ?Clinical Social Work ?[phone] ? ?

## 2021-09-16 NOTE — Progress Notes (Signed)
Discharge instructions given, all questions and concerns addressed, and patient verbalized understanding of instructions. Follow-up appointments, medications, glycemic control, when to check CBG, nutrition, and when to call the provider discussed. Patient is alert and oriented x 4, ambulatory, VS stable, and patient states no pain at this time.  ?

## 2021-09-16 NOTE — Progress Notes (Cosign Needed)
[facility] Followup Face-to-Face Psychiatric Evaluation   Service Date: September 16, 2021 LOS:  LOS: 4 days    Theresa Mccoy is a 41 y.o. female admitted medically for 09/11/2021 10:56 PM for hypoglycemia in pregnancy. She carries the psychiatric diagnoses of alleged bipolar disorder and PTSD and has a past medical history of  hyperthyroidism and hyperparathyroidism.Psychiatry was consulted for "severe bipolar disorder" by Aletha Halim, MD.      Patient states that she has mental health follow-up through her OB/GYN clinic, and saw this person today on a video call. Patient appears to be happy starting Haldol. Patient appears a bit more goal oriented in her thought process and was noted to have less inappropriate laughter. Risk and benefits of starting haldol was again discussed with patient as was the fact that unborn child will be at increased risk for mental health disease based on FH but not due to patient taking Haldol while pregnant.     From initial psychiatric intake: Overall, she was a poor historian and often had to ask questions multiple times in different ways to get an answer. It is difficult to establish a narrative history of her illness however appears to have been diagnosed with bipolar disorder at the age of 71 due to significant irritability. She endorses some symptoms consistent with hypomanic episodes (1-2 days of poor sleep with irritability) however did not endorse any history of psychiatric hospitalizations or a history of full manic episodes; this is consistent with prior psychiatric notes that have been reviewed. Did endorse recent auditory hallucinations and appeared to have some mild thought disorganization and parsed phrases oddly throughout exam. She has a history of pseudocyesis with multiple emergency room visits for this reason (is actually pregnant at time of exam). Will need to gather more collateral from husband; overall picture appears to be more  consistent with psychotic spectrum disorder (or bipolar II vs MDD with psychotic features/delusional disorder) than true bipolar I disorder. She did have a psychiatric hospitalization in 2020 (denied any psych hospitalizations on exam) in which she was diagnosed with MDD with psychotic features which is generally consistent with above assessment. She was discharged from that hospitalization on a risperidone LAI with noted improvement in outbursts and irritability. She endorses an history of trauma as a child and suffers from hypervigilance, nightmares, and flashbacks; has previously been diagnosed with PTSD which I generally agree with. She also has both hyperthyroidism and hyperparathyroidism which will worsen underlying psychiatric disorders (psych hx does appear to predate endocrine abnormalities).  Patient is open to taking psychotropic medications during pregnancy "if they are safe for her baby". Discussed this topic generally with pt; will hold off on medications for now (building alliance, pt undergoing surgery tomorrow for parathyroid removal, hx prolonged qtc and undergoing anesthesia). Regardless of underlying diagnosis, both pt and review of records suggest she has done well on risperidone and will likely suggest this as monotherapy to minimize polypharmacy and reduce risk of harm to baby.    Diagnoses:  Active Hospital problems: Principal Problem:   Hypoglycemia associated with gestational diabetes [facility]) Active Problems:   UTI in pregnancy   Primary hyperparathyroidism ([place])   AKI (acute kidney injury) ([place])   Severe bipolar I disorder, current or most recent episode depressed ([place])   Supervision of high risk pregnancy, antepartum   Previous cesarean delivery x 2 affecting pregnancy, antepartum   Gestational diabetes mellitus (GDM) affecting pregnancy, antepartum   Chronic hypertension affecting pregnancy   Anemia in pregnancy,  second trimester     Plan  ## Safety and Observation  Level:  - Based on my clinical evaluation, I estimate the patient to be at low risk of self harm in the current setting - At this time, we recommend a routine level of observation. This decision is based on my review of the chart including patient's history and current presentation, interview of the patient, mental status examination, and consideration of suicide risk including evaluating suicidal ideation, plan, intent, suicidal or self-harm behaviors, risk factors, and protective factors. This judgment is based on our ability to directly address suicide risk, implement suicide prevention strategies and develop a safety plan while the patient is in the clinical setting. Please contact our team if there is a concern that risk level has changed.     ## Medications:  -- Continue low dose Haldol '2mg'$  QHS - She does still smoke cigarettes, and may need a higher dose after discharge.  Have encouraged her to quit smoking.       ## Medical Decision Making Capacity:  Not formally assessed   ## Further Work-up:  -- consider B12 -- most recent EKG on 09/12/20 had QtC of 400 - She requests referral for her "shaking", saying the only thing that keeps her from shaking is weed- she is currently smoking but not inhaling and says it is not effective now.  Reviewed that this could be effecting her Bipolar illness, and recommended not using marijuana.    ## Disposition:  -- Does not appear to require psychiatric hospitalization when medically cleared - Recommend TOC consult to ensure patient has outpatient psychiatric follow-up.   Thank you for this consult request. Recommendations have been communicated to the primary team.  We will sign off at this time.  PGY-2 Freida Busman, MD   followup history  Relevant Aspects of Hospital Course:  Admitted on 09/11/2021 for hypoglycemia; new dx diabetes with this pregnancy.  Patient Report:  On assessment today patient reports that she does not believe she is  having any adverse side effects to starting the Haldol. Patient reports that she is feeling "good" and is texting her husband about discharge. Patient endorses that she intends to make all of her follow- up appts. Patient reported that she had seen a member of her OP healthcare team today via phone calls. Patient endorses that her appetite is stable and reports she is sleeping well. Patient denies, SI, HI and AVH.   Collateral information:  Patient discussed husband.   Psychiatric History:  Information collected from patient, medical record No hx psychiatric hospitalizations.    In 2022 was on risperidone 1 mg AM 2 mg QHS, trazodone 50 mg, gabapentin 300 mg TID, hydroxyzine 10 TID.    Family psych history: No known history     Social History:  No EtOH, did drink before pregnancy. Would drink socially. Smokes cigarettes but has cut back now that she is pregnant. Smokes marijuana to calm her nerves.    Graduated 10th grade, no hx special ed   Family History:  The patient's family history includes Asthma in her paternal grandmother; Diabetes in her brother.  Medical History: Past Medical History:  Diagnosis Date   AKI (acute kidney injury) ([place]) 01/22/2020   Anemia    Depression    Diabetes mellitus without complication ([facility])    Hypernatremia 01/22/2020   Hyperthyroidism    PTSD (post-traumatic stress disorder)    SBO (small bowel obstruction) ([place]) 07/07/2019   Sepsis ([place]) 01/22/2020  Surgical History: Past Surgical History:  Procedure Laterality Date   BOWEL RESECTION N/A 07/08/2019   Procedure: Small Bowel Resection;  Surgeon: Clovis Riley, MD;  Location: [place];  Service: General;  Laterality: N/A;   CESAREAN SECTION     DILATION AND CURETTAGE OF UTERUS     HAND SURGERY Left    LAPAROTOMY N/A 07/08/2019   Procedure: EXPLORATORY LAPAROTOMY;  Surgeon: Clovis Riley, MD;  Location: [place] [facility];  Service: General;  Laterality: N/A;   OOPHORECTOMY Right     PARATHYROIDECTOMY Right 09/13/2021   Procedure: RIGHT INFERIOR PARATHYROIDECTOMY;  Surgeon: Armandina Gemma, MD;  Location: [place];  Service: General;  Laterality: Right;   SALPINGECTOMY      Medications:   Current Facility-Administered Medications:    0.9 %  sodium chloride infusion, , Intravenous, Continuous, Roney Jaffe, MD, Last Rate: 75 mL/hr at 09/16/21 1100, Infusion Verify at 09/16/21 1100   acetaminophen (TYLENOL) tablet 650 mg, 650 mg, Oral, Q4H PRN, Armandina Gemma, MD, 650 mg at 09/15/21 2133   bacitracin ointment, , Topical, BID PRN, Aletha Halim, MD, 1 application. at 09/15/21 1031   calcium carbonate (TUMS - dosed in mg elemental calcium) chewable tablet 1,000 mg of elemental calcium, 5 tablet, Oral, BID, Maczis, Barth Kirks, PA-C, 1,000 mg of elemental calcium at 09/16/21 4742   diphenhydrAMINE (BENADRYL) capsule 25 mg, 25 mg, Oral, Q6H PRN, Radene Gunning, MD, 25 mg at 09/16/21 5956   docusate sodium (COLACE) capsule 100 mg, 100 mg, Oral, BID, Aletha Halim, MD, 100 mg at 09/16/21 3875   haloperidol (HALDOL) tablet 2 mg, 2 mg, Oral, QHS, Pickens, Charlie, MD, 2 mg at 09/15/21 2143   insulin aspart (novoLOG) injection 0-10 Units, 0-10 Units, Subcutaneous, TID PC, Aletha Halim, MD   iron sucrose (VENOFER) 500 mg in sodium chloride 0.9 % 250 mL IVPB, 500 mg, Intravenous, Once, Radene Gunning, MD, Last Rate: 78.6 mL/hr at 09/16/21 1059, 500 mg at 09/16/21 1059   levothyroxine (SYNTHROID) tablet 75 mcg, 75 mcg, Oral, Once per day on Mon Tue Wed Thu Fri Sat, Gerkin, Todd, MD, 75 mcg at 09/16/21 6433  Allergies: Allergies  Allergen Reactions   Codeine Other (See Comments)    Constipation        Objective  Vital signs:  Temp:  [97.9 F (36.6 C)-98.2 F (36.8 C)] 98.2 F (36.8 C) (03/13 1114) Pulse Rate:  [64-83] 64 (03/13 1114) Resp:  [17-18] 18 (03/13 0724) BP: (94-123)/(52-72) 104/60 (03/13 1114) SpO2:  [98 %-100 %] 100 % (03/13 1114) Weight:  [63.2 kg] 63.2 kg  (03/13 0443)  Psychiatric Specialty Exam:  Presentation  General Appearance: -- (hair is still all over patient's head sticking straight up, but patient appears comfortable)  Eye Contact:Fair  Speech:Clear and Coherent  Speech Volume:Normal  Handedness:Right   Mood and Affect  Mood:-- ("good")  Affect:Congruent   Thought Process  Thought Processes:Goal Directed  Descriptions of Associations:Circumstantial  Orientation:Full (Time, Place and Person)  Thought Content:Logical  History of Schizophrenia/Schizoaffective disorder:No data recorded Duration of Psychotic Symptoms:No data recorded Hallucinations:Hallucinations: None Description of Auditory Hallucinations: Tapping sounds  Ideas of Reference:None  Suicidal Thoughts:Suicidal Thoughts: No  Homicidal Thoughts:Homicidal Thoughts: No   Sensorium  Memory:Immediate Fair  Judgment:Fair  Insight:Shallow   Executive Functions  Concentration:Fair  Attention Span:Fair  [place] of Knowledge:Poor  Language:Fair   Psychomotor Activity  Psychomotor Activity:Psychomotor Activity: Normal   Assets  Assets:Resilience; Desire for Improvement   Sleep  Sleep:Sleep: Good    Physical Exam:  Physical Exam HENT:     Head: Normocephalic and atraumatic.  Neurological:     Mental Status: She is alert and oriented to person, place, and time.   Review of Systems  Psychiatric/Behavioral:  Negative for hallucinations and suicidal ideas.   Blood pressure 104/60, pulse 64, temperature 98.2 F (36.8 C), resp. rate 18, height 5' (1.524 m), weight 63.2 kg, SpO2 100 %. Body mass index is 27.22 kg/m.

## 2021-09-16 NOTE — Discharge Summary (Signed)
Antenatal Physician Discharge Summary  Patient ID: Theresa Mccoy MRN: [id number] DOB/AGE: [birth year] 41 y.o.  Admit date: 09/11/2021 Discharge date: 09/16/2021  Admission Diagnoses: Hypoglycemia [E16.2] Hypoglycemia associated with diabetes ([place]) [E11.649] Acute cystitis without hematuria [N30.00] Hyperparathyroidism Hypercalcemia Nephrolithiasis Acute Kidney Injury Pregnancy at 25 completed weeks  Discharge Diagnoses:  Hypoglycemia [E16.2] Hypoglycemia associated with diabetes ([place]) [E11.649] Acute cystitis without hematuria [N30.00] Hyperparathyroidism Hypercalcemia Nephrolithiasis Acute Kidney Injury Pregnancy at 26 completed weeks  Prenatal Procedures: NST, Renal US, Right parathyroidectomy, IV venofer  Consults: General surgery, Nephrology, Nutrition, Transitions of Care, Psychiatry, Urology  Hospital Course:  Theresa Mccoy is a 41 y.o. [id number] with IUP at 87w1dadmitted initially for hypoglycemia following initiation of insulin outpatient. She has poor medical comprehension and little insight based on our conversations.  During her admission she was taken off insulin and placed on a carb-modified diet and this controlled her CBGs well such that she did not need any insulin and thus would ultimately be sent home without medications at this time. Her fetal growth is normal at 35%ile with a normal AC and AFI. Nutrition was consulted to review appropriate diet with the patient.   She has chronic hypertension but has not been on medications. She was also noted to have a UTI and was given 3 days of rocephin for this. She will have a test of cure as an outpatient.   Additionally while she was here, general surgery was consulted due to her parathyroid gland having a nodule with concomitant hypercalcemia. She ultimately had resection of this portion of the parathyroid and her calcium has since normalized. Following her procedure she had an increase in her creatinine which has since  stabilized at 1.45. Neprology was consulted and she was given fluids in addition to renal [place] labs being checked. She was felt to have prerenal AKI which they felt was reasonable to follow as an outpatient since she is urinating well (exact I/Os unable to be monitored due to her being incontinent. Her renal [place] continued right hydronephrosis and cortical thinning similar to November when she had this [place] was noted to have a 1.5 cm stone on that side. She does not have any pain. I spoke with Dr. MCain Sieveand he agreed with out patient follow up for the stone. I have sent a message to my office to arrange this follow up and she will also have an appointment with Dr. KJohnney Ou(Dr. KJohnney Ouwill arrange). In the interim, Dr. KJohnney Ouand I discussed we will order a renal function panel at her next appointment with [place](reminder placed in chart).   She was also seen by psychiatry, Dr. CLovette Clicheand her team, and the patient was started on haldol which she may continue. TOC was consulted to ensure follow up with psychiatry. [facility]   She was noted to be anemic and given diet, likely iron deficiency. IV iron given after reviewing with team consulted.   Lastly, she and I discussed birth control options. She does not want anything permanent (I.e. BTL) but doesn't know what she would like to do. She was not sure that she would be able to get pregnant after this and I clarified she can conceive until she is completely through menopause. Reviewed birth control but she is undecided.   She was deemed stable for discharge to home with close outpatient follow up.  Discharge Exam: Temp:  [97.9 F (36.6 C)-98.2 F (36.8 C)] 98.2 F (36.8 C) (  03/13 1114) Pulse Rate:  [64-83] 64 (03/13 1114) Resp:  [17-18] 18 (03/13 0724) BP: (94-123)/(52-72) 104/60 (03/13 1114) SpO2:  [98 %-100 %] 100 % (03/13 1114) Weight:  [63.2 kg] 63.2 kg (03/13 0443) Physical  Examination: CONSTITUTIONAL: Well-developed, well-nourished female in no acute distress.  HENT:  Normocephalic, atraumatic, External right and left ear normal.  EYES: Conjunctivae and EOM are normal. Pupils are equal, round, and reactive to light. No scleral icterus.  NECK: Normal range of motion, supple, no masses SKIN: Skin is warm and dry. No rash noted. Not diaphoretic. No erythema. No pallor. NEUROLOGIC: Alert and oriented to person, place, and time. Normal reflexes, muscle tone coordination. No cranial nerve deficit noted. PSYCHIATRIC: Normal mood and affect. Normal behavior. Normal judgment and thought content. CARDIOVASCULAR: Normal heart rate noted, regular rhythm RESPIRATORY: Effort and breath sounds normal, no problems with respiration noted MUSCULOSKELETAL: Normal range of motion. No edema and no tenderness. 2+ distal pulses. ABDOMEN: Soft, nontender, nondistended, gravid. CERVIX:  Not examined  Fetal monitoring: FHR: 140 bpm, Variability: moderate, Accelerations: Present (appropriate for GA), Decelerations: Absent  Uterine activity: no contractions   Significant Diagnostic Studies:  Results for orders placed or performed during the hospital encounter of 09/11/21 (from the past 168 hour(s))  CBG monitoring, ED   Collection Time: 09/11/21 11:05 PM  Result Value Ref Range   Glucose-Capillary 127 (H) 70 - 99 mg/dL  CBC with Differential   Collection Time: 09/11/21 11:10 PM  Result Value Ref Range   WBC 13.3 (H) 4.0 - 10.5 K/uL   RBC 3.11 (L) 3.87 - 5.11 MIL/uL   Hemoglobin 9.9 (L) 12.0 - 15.0 g/dL   HCT 29.4 (L) 36.0 - 46.0 %   MCV 94.5 80.0 - 100.0 fL   MCH 31.8 26.0 - 34.0 pg   MCHC 33.7 30.0 - 36.0 g/dL   RDW 13.6 11.5 - 15.5 %   Platelets 287 150 - 400 K/uL   nRBC 0.0 0.0 - 0.2 %   Neutrophils Relative % 80 %   Neutro Abs 10.7 (H) 1.7 - 7.7 K/uL   Lymphocytes Relative 13 %   Lymphs Abs 1.7 0.7 - 4.0 K/uL   Monocytes Relative 6 %   Monocytes Absolute 0.7 0.1 - 1.0  K/uL   Eosinophils Relative 0 %   Eosinophils Absolute 0.0 0.0 - 0.5 K/uL   Basophils Relative 0 %   Basophils Absolute 0.0 0.0 - 0.1 K/uL   Immature Granulocytes 1 %   Abs Immature Granulocytes 0.08 (H) 0.00 - 0.07 K/uL  Basic metabolic panel   Collection Time: 09/11/21 11:10 PM  Result Value Ref Range   Sodium 135 135 - 145 mmol/L   Potassium 3.7 3.5 - 5.1 mmol/L   Chloride 112 (H) 98 - 111 mmol/L   CO2 16 (L) 22 - 32 mmol/L   Glucose, Bld 132 (H) 70 - 99 mg/dL   BUN 6 6 - 20 mg/dL   Creatinine, Ser 1.10 (H) 0.44 - 1.00 mg/dL   Calcium 12.1 (H) 8.9 - 10.3 mg/dL   GFR, Estimated >60 >60 mL/min   Anion gap 7 5 - 15  Urinalysis, Routine w reflex microscopic Urine, Clean Catch   Collection Time: 09/11/21 11:10 PM  Result Value Ref Range   Color, Urine AMBER (A) YELLOW   APPearance TURBID (A) CLEAR   Specific Gravity, Urine 1.013 1.005 - 1.030   pH 6.0 5.0 - 8.0   Glucose, UA 150 (A) NEGATIVE mg/dL   Hgb urine dipstick  SMALL (A) NEGATIVE   Bilirubin Urine NEGATIVE NEGATIVE   Ketones, ur 5 (A) NEGATIVE mg/dL   Protein, ur 30 (A) NEGATIVE mg/dL   Nitrite NEGATIVE NEGATIVE   Leukocytes,Ua LARGE (A) NEGATIVE   RBC / HPF 6-10 0 - 5 RBC/hpf   WBC, UA >50 (H) 0 - 5 WBC/hpf   Bacteria, UA MANY (A) NONE SEEN   Squamous Epithelial / LPF 6-10 0 - 5  Calcium, ionized   Collection Time: 09/11/21 11:10 PM  Result Value Ref Range   Calcium, Ionized, Serum 7.2 (H) 4.5 - 5.6 mg/dL  Urine Culture   Collection Time: 09/12/21 12:13 AM   Specimen: Urine, Clean Catch  Result Value Ref Range   Specimen Description URINE, CLEAN CATCH    Special Requests      NONE Performed at [facility], [place] [address] [place], [place] [address]    Culture MULTIPLE SPECIES PRESENT, SUGGEST RECOLLECTION (A)    Report Status 09/13/2021 FINAL   CBG monitoring, ED   Collection Time: 09/12/21  1:55 AM  Result Value Ref Range   Glucose-Capillary 30 (LL) 70 - 99 mg/dL   Comment 1 Notify RN   CBG  monitoring, ED   Collection Time: 09/12/21  2:16 AM  Result Value Ref Range   Glucose-Capillary 150 (H) 70 - 99 mg/dL  Resp Panel by RT-PCR (Flu A&B, Covid) Nasopharyngeal Swab   Collection Time: 09/12/21  2:25 AM   Specimen: Nasopharyngeal Swab; Nasopharyngeal(NP) swabs in vial transport medium  Result Value Ref Range   SARS Coronavirus 2 by RT PCR NEGATIVE NEGATIVE   Influenza A by PCR NEGATIVE NEGATIVE   Influenza B by PCR NEGATIVE NEGATIVE  Type and screen [place]   Collection Time: 09/12/21  3:47 AM  Result Value Ref Range   ABO/RH(D) O POS    Antibody Screen NEG    Sample Expiration      09/15/2021,2359 Performed at [facility], [place] [address], [place], [place] [address]   CBG monitoring, ED   Collection Time: 09/12/21  3:50 AM  Result Value Ref Range   Glucose-Capillary 75 70 - 99 mg/dL  CBC on admission   Collection Time: 09/12/21  4:43 AM  Result Value Ref Range   WBC 12.0 (H) 4.0 - 10.5 K/uL   RBC 2.92 (L) 3.87 - 5.11 MIL/uL   Hemoglobin 9.2 (L) 12.0 - 15.0 g/dL   HCT 28.3 (L) 36.0 - 46.0 %   MCV 96.9 80.0 - 100.0 fL   MCH 31.5 26.0 - 34.0 pg   MCHC 32.5 30.0 - 36.0 g/dL   RDW 13.6 11.5 - 15.5 %   Platelets 254 150 - 400 K/uL   nRBC 0.0 0.0 - 0.2 %  Comprehensive metabolic panel   Collection Time: 09/12/21  4:43 AM  Result Value Ref Range   Sodium 137 135 - 145 mmol/L   Potassium 4.4 3.5 - 5.1 mmol/L   Chloride 113 (H) 98 - 111 mmol/L   CO2 19 (L) 22 - 32 mmol/L   Glucose, Bld 80 70 - 99 mg/dL   BUN 5 (L) 6 - 20 mg/dL   Creatinine, Ser 1.07 (H) 0.44 - 1.00 mg/dL   Calcium 12.0 (H) 8.9 - 10.3 mg/dL   Total Protein 5.3 (L) 6.5 - 8.1 g/dL   Albumin 2.2 (L) 3.5 - 5.0 g/dL   AST 14 (L) 15 - 41 U/L   ALT 13 0 - 44 U/L   Alkaline  Phosphatase 98 38 - 126 U/L   Total Bilirubin <0.1 (L) 0.3 - 1.2 mg/dL   GFR, Estimated >60 >60 mL/min   Anion gap 5 5 - 15  CBG monitoring, ED   Collection Time: 09/12/21  6:08 AM  Result Value Ref  Range   Glucose-Capillary 95 70 - 99 mg/dL  Glucose, capillary   Collection Time: 09/12/21  8:01 AM  Result Value Ref Range   Glucose-Capillary 99 70 - 99 mg/dL  Glucose, capillary   Collection Time: 09/12/21  9:55 AM  Result Value Ref Range   Glucose-Capillary 120 (H) 70 - 99 mg/dL  Glucose, capillary   Collection Time: 09/12/21 11:20 AM  Result Value Ref Range   Glucose-Capillary 116 (H) 70 - 99 mg/dL  Glucose, capillary   Collection Time: 09/12/21  4:19 PM  Result Value Ref Range   Glucose-Capillary 107 (H) 70 - 99 mg/dL  Glucose, capillary   Collection Time: 09/12/21 10:51 PM  Result Value Ref Range   Glucose-Capillary 99 70 - 99 mg/dL  Comprehensive metabolic panel   Collection Time: 09/13/21  4:54 AM  Result Value Ref Range   Sodium 135 135 - 145 mmol/L   Potassium 4.1 3.5 - 5.1 mmol/L   Chloride 114 (H) 98 - 111 mmol/L   CO2 17 (L) 22 - 32 mmol/L   Glucose, Bld 84 70 - 99 mg/dL   BUN 7 6 - 20 mg/dL   Creatinine, Ser 1.31 (H) 0.44 - 1.00 mg/dL   Calcium 11.5 (H) 8.9 - 10.3 mg/dL   Total Protein 4.9 (L) 6.5 - 8.1 g/dL   Albumin 2.0 (L) 3.5 - 5.0 g/dL   AST 13 (L) 15 - 41 U/L   ALT 11 0 - 44 U/L   Alkaline Phosphatase 88 38 - 126 U/L   Total Bilirubin <0.1 (L) 0.3 - 1.2 mg/dL   GFR, Estimated 52 (L) >60 mL/min   Anion gap 4 (L) 5 - 15  Glucose, capillary   Collection Time: 09/13/21  5:27 AM  Result Value Ref Range   Glucose-Capillary 79 70 - 99 mg/dL  Glucose, capillary   Collection Time: 09/13/21 11:36 AM  Result Value Ref Range   Glucose-Capillary 62 (L) 70 - 99 mg/dL   Comment 1 Notify RN    Comment 2 Document in Chart   Glucose, capillary   Collection Time: 09/13/21 12:05 PM  Result Value Ref Range   Glucose-Capillary 138 (H) 70 - 99 mg/dL  Glucose, capillary   Collection Time: 09/13/21 12:57 PM  Result Value Ref Range   Glucose-Capillary 81 70 - 99 mg/dL   Comment 1 Notify RN    Comment 2 Document in Chart   Glucose, capillary   Collection Time:  09/13/21  4:26 PM  Result Value Ref Range   Glucose-Capillary 69 (L) 70 - 99 mg/dL  Glucose, capillary   Collection Time: 09/13/21  4:50 PM  Result Value Ref Range   Glucose-Capillary 74 70 - 99 mg/dL  Glucose, capillary   Collection Time: 09/13/21  5:59 PM  Result Value Ref Range   Glucose-Capillary 86 70 - 99 mg/dL   Comment 1 Notify RN    Comment 2 Document in Chart   Glucose, capillary   Collection Time: 09/14/21 12:06 AM  Result Value Ref Range   Glucose-Capillary 142 (H) 70 - 99 mg/dL  Comprehensive metabolic panel   Collection Time: 09/14/21  4:25 AM  Result Value Ref Range   Sodium 134 (L) 135 - 145  mmol/L   Potassium 4.1 3.5 - 5.1 mmol/L   Chloride 112 (H) 98 - 111 mmol/L   CO2 17 (L) 22 - 32 mmol/L   Glucose, Bld 124 (H) 70 - 99 mg/dL   BUN 9 6 - 20 mg/dL   Creatinine, Ser 1.31 (H) 0.44 - 1.00 mg/dL   Calcium 10.1 8.9 - 10.3 mg/dL   Total Protein 4.1 (L) 6.5 - 8.1 g/dL   Albumin 1.7 (L) 3.5 - 5.0 g/dL   AST 12 (L) 15 - 41 U/L   ALT 10 0 - 44 U/L   Alkaline Phosphatase 80 38 - 126 U/L   Total Bilirubin 0.2 (L) 0.3 - 1.2 mg/dL   GFR, Estimated 52 (L) >60 mL/min   Anion gap 5 5 - 15  Glucose, capillary   Collection Time: 09/14/21  6:01 AM  Result Value Ref Range   Glucose-Capillary 116 (H) 70 - 99 mg/dL  TSH   Collection Time: 09/14/21  8:57 AM  Result Value Ref Range   TSH <0.010 (L) 0.350 - 4.500 uIU/mL  T4, free   Collection Time: 09/14/21  8:57 AM  Result Value Ref Range   Free T4 1.40 (H) 0.61 - 1.12 ng/dL  T4   Collection Time: 09/14/21  8:57 AM  Result Value Ref Range   T4, Total 13.2 (H) 4.5 - 12.0 ug/dL  Vitamin B12   Collection Time: 09/14/21  8:57 AM  Result Value Ref Range   Vitamin B-12 85 (L) 180 - 914 pg/mL  Folate   Collection Time: 09/14/21  8:57 AM  Result Value Ref Range   Folate 9.8 >5.9 ng/mL  Iron and TIBC   Collection Time: 09/14/21  8:57 AM  Result Value Ref Range   Iron 42 28 - 170 ug/dL   TIBC 239 (L) 250 - 450 ug/dL    Saturation Ratios 18 10.4 - 31.8 %   UIBC 197 ug/dL  Ferritin   Collection Time: 09/14/21  8:57 AM  Result Value Ref Range   Ferritin 30 11 - 307 ng/mL  Reticulocytes   Collection Time: 09/14/21  8:58 AM  Result Value Ref Range   Retic Ct Pct 1.3 0.4 - 3.1 %   RBC. 2.28 (L) 3.87 - 5.11 MIL/uL   Retic Count, Absolute 29.9 19.0 - 186.0 K/uL   Immature Retic Fract 13.6 2.3 - 15.9 %  Glucose, capillary   Collection Time: 09/14/21  9:56 AM  Result Value Ref Range   Glucose-Capillary 109 (H) 70 - 99 mg/dL  Glucose, capillary   Collection Time: 09/14/21 12:47 PM  Result Value Ref Range   Glucose-Capillary 110 (H) 70 - 99 mg/dL  Glucose, capillary   Collection Time: 09/14/21  6:30 PM  Result Value Ref Range   Glucose-Capillary 94 70 - 99 mg/dL  Glucose, capillary   Collection Time: 09/14/21 10:34 PM  Result Value Ref Range   Glucose-Capillary 96 70 - 99 mg/dL  Comprehensive metabolic panel   Collection Time: 09/15/21  5:06 AM  Result Value Ref Range   Sodium 136 135 - 145 mmol/L   Potassium 3.8 3.5 - 5.1 mmol/L   Chloride 114 (H) 98 - 111 mmol/L   CO2 19 (L) 22 - 32 mmol/L   Glucose, Bld 83 70 - 99 mg/dL   BUN 14 6 - 20 mg/dL   Creatinine, Ser 1.43 (H) 0.44 - 1.00 mg/dL   Calcium 9.6 8.9 - 10.3 mg/dL   Total Protein 4.4 (L) 6.5 - 8.1 g/dL  Albumin 1.7 (L) 3.5 - 5.0 g/dL   AST 13 (L) 15 - 41 U/L   ALT 9 0 - 44 U/L   Alkaline Phosphatase 73 38 - 126 U/L   Total Bilirubin 0.1 (L) 0.3 - 1.2 mg/dL   GFR, Estimated 47 (L) >60 mL/min   Anion gap 3 (L) 5 - 15  Glucose, capillary   Collection Time: 09/15/21  6:25 AM  Result Value Ref Range   Glucose-Capillary 75 70 - 99 mg/dL  Glucose, capillary   Collection Time: 09/15/21  9:42 AM  Result Value Ref Range   Glucose-Capillary 86 70 - 99 mg/dL  Glucose, capillary   Collection Time: 09/15/21  1:13 PM  Result Value Ref Range   Glucose-Capillary 89 70 - 99 mg/dL  Glucose, capillary   Collection Time: 09/15/21  4:49 PM  Result  Value Ref Range   Glucose-Capillary 102 (H) 70 - 99 mg/dL  Creatinine, urine, random   Collection Time: 09/15/21  8:15 PM  Result Value Ref Range   Creatinine, Urine 91.06 mg/dL  Sodium, urine, random   Collection Time: 09/15/21  8:15 PM  Result Value Ref Range   Sodium, Ur 60 mmol/L  Urinalysis, Routine w reflex microscopic Urine, Clean Catch   Collection Time: 09/15/21  8:15 PM  Result Value Ref Range   Color, Urine YELLOW YELLOW   APPearance HAZY (A) CLEAR   Specific Gravity, Urine 1.011 1.005 - 1.030   pH 6.0 5.0 - 8.0   Glucose, UA NEGATIVE NEGATIVE mg/dL   Hgb urine dipstick NEGATIVE NEGATIVE   Bilirubin Urine NEGATIVE NEGATIVE   Ketones, ur NEGATIVE NEGATIVE mg/dL   Protein, ur NEGATIVE NEGATIVE mg/dL   Nitrite NEGATIVE NEGATIVE   Leukocytes,Ua MODERATE (A) NEGATIVE   RBC / HPF 0-5 0 - 5 RBC/hpf   WBC, UA 21-50 0 - 5 WBC/hpf   Bacteria, UA FEW (A) NONE SEEN   Squamous Epithelial / LPF 0-5 0 - 5   Mucus PRESENT   Glucose, capillary   Collection Time: 09/15/21  9:37 PM  Result Value Ref Range   Glucose-Capillary 95 70 - 99 mg/dL  Comprehensive metabolic panel   Collection Time: 09/16/21  4:16 AM  Result Value Ref Range   Sodium 138 135 - 145 mmol/L   Potassium 3.8 3.5 - 5.1 mmol/L   Chloride 116 (H) 98 - 111 mmol/L   CO2 17 (L) 22 - 32 mmol/L   Glucose, Bld 85 70 - 99 mg/dL   BUN 16 6 - 20 mg/dL   Creatinine, Ser 1.45 (H) 0.44 - 1.00 mg/dL   Calcium 8.6 (L) 8.9 - 10.3 mg/dL   Total Protein 4.0 (L) 6.5 - 8.1 g/dL   Albumin 1.6 (L) 3.5 - 5.0 g/dL   AST 18 15 - 41 U/L   ALT 11 0 - 44 U/L   Alkaline Phosphatase 69 38 - 126 U/L   Total Bilirubin 0.4 0.3 - 1.2 mg/dL   GFR, Estimated 46 (L) >60 mL/min   Anion gap 5 5 - 15  CBC   Collection Time: 09/16/21  4:16 AM  Result Value Ref Range   WBC 9.3 4.0 - 10.5 K/uL   RBC 2.21 (L) 3.87 - 5.11 MIL/uL   Hemoglobin 7.2 (L) 12.0 - 15.0 g/dL   HCT 21.3 (L) 36.0 - 46.0 %   MCV 96.4 80.0 - 100.0 fL   MCH 32.6 26.0 -  34.0 pg   MCHC 33.8 30.0 - 36.0 g/dL   RDW 13.4  11.5 - 15.5 %   Platelets 194 150 - 400 K/uL   nRBC 0.0 0.0 - 0.2 %  Glucose, capillary   Collection Time: 09/16/21  5:13 AM  Result Value Ref Range   Glucose-Capillary 77 70 - 99 mg/dL  Results for orders placed or performed in visit on 09/16/21 (from the past 168 hour(s))  Glucose, capillary   Collection Time: 09/16/21  8:35 AM  Result Value Ref Range   Glucose-Capillary 85 70 - 99 mg/dL   US RENAL  Result Date: 09/16/2021 CLINICAL DATA:  Acute kidney injury.  Twenty-six weeks pregnant. EXAM: RENAL / URINARY TRACT ULTRASOUND COMPLETE COMPARISON:  Renal ultrasound dated May 26, 2021. FINDINGS: Right Kidney: Renal measurements: 13.2 x 6.7 x 5.7 cm = volume: 261 mL. Similar severe hydronephrosis and cortical thinning. Suspect 1.5 cm calculus in the distal right ureter. No mass visualized. Left Kidney: Renal measurements: 12.0 x 6.2 x 4.5 cm = volume: 168 mL. Echogenicity within normal limits. No mass or hydronephrosis visualized. Bladder: Appears normal for degree of bladder distention. Other: None. IMPRESSION: 1. Similar severe right hydronephrosis and cortical thinning. Suspected 1.5 cm calculus in the distal right ureter. Electronically Signed   By: Titus Dubin M.D.   On: 09/16/2021 10:41   [place] MFM OB FOLLOW UP  Result Date: 09/04/2021 ----------------------------------------------------------------------  OBSTETRICS REPORT                       (Signed Final 09/04/2021 03:35 pm) ---------------------------------------------------------------------- Patient Info  ID #:       [id number]                          D.O.B.:  1981/06/12 (41 yrs)  Name:       [name]                  Visit Date: 09/04/2021 01:48 pm ---------------------------------------------------------------------- Performed By  Attending:        Sander Nephew      Secondary Phy.:   PEGGY                    MD                                                              CONSTANT MD  Performed By:     Germain Osgood            Address:          Faculty                    RDMS  Referred By:      Aquia Harbour          [facility]  Ref. Address:     [address]                    [place], [place] ---------------------------------------------------------------------- Orders  #  Description                           Code        Ordered By  1  [place] MFM OB FOLLOW UP                   [phone]    [place] ----------------------------------------------------------------------  #  Order #                     Accession #                Episode #  1  [id number]                   [id number]                 [id number] ---------------------------------------------------------------------- Indications  Gestational diabetes in pregnancy,             O24.415  controlled by oral hypoglycemic drugs  Hypothyroid (Synthyroid)                       O99.280 E03.9  Advanced maternal age multigravida 73+,        O41.522  second trimester (41 yo)  History of cesarean delivery, currently        O34.219  pregnant  Poor obstetric history-Recurrent (habitual)    O26.20  abortion (8 consecutive ab's)  [redacted] weeks gestation of pregnancy                Z3A.24 ---------------------------------------------------------------------- Fetal Evaluation  Num Of Fetuses:         1  Fetal Heart Rate(bpm):  147  Cardiac Activity:       Observed  Presentation:           Cephalic  Placenta:               Posterior  P. Cord Insertion:      Visualized, central  Amniotic Fluid  AFI FV:      Within normal limits                              Largest Pocket(cm)                              4.57 ---------------------------------------------------------------------- Biometry   BPD:      64.7  mm     G. Age:  26w 1d         92  %    CI:        76.02   %    70 - 86  FL/HC:      18.5   %    18.7 - 20.9  HC:      235.2  mm     G. Age:  25w 4d         73  %    HC/AC:      1.22        1.05 - 1.21  AC:      192.1  mm     G. Age:  24w 0d         26  %    FL/BPD:     67.1   %    71 - 87  FL:       43.4  mm     G. Age:  24w 2d         31  %    FL/AC:      22.6   %    20 - 24  HUM:      41.1  mm     G. Age:  25w 0d         53  %  LV:        6.2  mm  Est. FW:     682  gm      1 lb 8 oz     35  % ---------------------------------------------------------------------- Gestational Age  U/S Today:     25w 0d                                        EDD:   12/18/21  Best:          24w 3d     Det. By:  Previous Ultrasound      EDD:   12/22/21                                      (05/26/21) ---------------------------------------------------------------------- Anatomy  Cranium:               Appears normal         LVOT:                   Previously seen  Cavum:                 Appears normal         Aortic Arch:            Previously seen  Ventricles:            Appears normal         Ductal Arch:            Appears normal  Choroid Plexus:        Previously seen        Diaphragm:              Appears normal  Cerebellum:            Previously seen        Stomach:                Appears normal, left  sided  Posterior Fossa:       Previously seen        Abdomen:                Appears normal  Nuchal Fold:           Not applicable (>42    Abdominal Wall:         Previously seen                         wks GA)  Face:                  Orbits and profile     Cord Vessels:           Previously seen                         previously seen  Lips:                  Previously seen        Kidneys:                Appear normal  Palate:                Not well visualized    Bladder:                 Appears normal  Thoracic:              Previously seen        Spine:                  Previously seen  Heart:                 Appears normal; EIF    Upper Extremities:      Appears normal  RVOT:                  Appears normal         Lower Extremities:      Previously seen  Other:  Fetus appears to be a female. Nasal bone prev visualized. Technically          difficult due to fetal position. ---------------------------------------------------------------------- Cervix Uterus Adnexa  Cervix  Not visualized (advanced GA >24wks)  Uterus  No abnormality visualized.  Right Ovary  Not visualized.  Left Ovary  Not visualized.  Cul De Sac  No free fluid seen.  Adnexa  No adnexal mass visualized. ---------------------------------------------------------------------- Impression  Follow up growth due to A2GDM, AMA (41 yo) and  hyperparathyroidism.  Normal interval growth with measurements consistent with  dates  Good fetal movement and amniotic fluid volume  Ms. [place] reports that her blood sugars are not well  controlled. She could not seem to gather her thoughts  regarding the management of her blood.  She is scheduled for surger for her hyperparathyroism. She is  being seen by endocrdinology. ---------------------------------------------------------------------- Recommendations  Repeat growth in 4 weeks.  Initiate weekly testing at 32 weeks. ----------------------------------------------------------------------               Sander Nephew, MD Electronically Signed Final Report   09/04/2021 03:35 pm ----------------------------------------------------------------------   Future Appointments  Date Time Provider [place]  09/17/2021  1:55 PM Aletha Halim, MD [facility]  10/02/2021  3:15 PM [facility] NURSE [facility]  10/02/2021  3:30 PM [facility] US3 [facility]  10/07/2021 11:30 AM Shamleffer,  Melanie Crazier, MD [facility] None  10/29/2021 11:00 AM Dory Horn, LCSW [facility] None  10/30/2021  2:30 PM  [facility] NURSE [facility]  10/30/2021  2:45 PM [facility] US5 [facility]    Discharge Condition: Stable    Discharge Instructions     Ambulatory referral to Nutrition and Diabetic Education   Complete by: As directed       Allergies as of 09/16/2021       Reactions   Codeine Other (See Comments)   Constipation        Medication List     STOP taking these medications    insulin lispro 100 UNIT/ML injection Commonly known as: HumaLOG   insulin NPH Human 100 UNIT/ML injection Commonly known as: HumuLIN N   Insulin Syringes (Disposable) U-100 1 ML Misc   metFORMIN 1000 MG tablet Commonly known as: GLUCOPHAGE       TAKE these medications    Accu-Chek Softclix Lancets lancets Use four times daily as instructed.   acetaminophen 325 MG tablet Commonly known as: TYLENOL Take 2 tablets (650 mg total) by mouth every 4 (four) hours as needed (for pain scale < 4  OR  temperature  >/=  100.5 F).   aspirin EC 81 MG tablet Take 1 tablet (81 mg total) by mouth daily. Swallow whole.   Blood Pressure Monitor Automat Devi 1 Device by Does not apply route daily. Automatic blood pressure cuff with regular cuff. Blood pressure to be monitored regularly at home. ICD-10 code: O09.90.   calcium carbonate 500 MG chewable tablet Commonly known as: TUMS - dosed in mg elemental calcium Chew 5 tablets (1,000 mg of elemental calcium total) by mouth 2 (two) times daily.   glucose blood test strip Use as instructed   haloperidol 2 MG tablet Commonly known as: HALDOL Take 1 tablet (2 mg total) by mouth at bedtime.   levothyroxine 75 MCG tablet Commonly known as: Synthroid Take 1 tablet (75 mcg total) by mouth daily before breakfast. Take on Monday, Tuesday, Wednesday, Thursday, Friday and Saturday. Do not take on Sunday. What changed: additional instructions   polyethylene glycol 17 g packet Commonly known as: MIRALAX / GLYCOLAX Take 17 g by mouth 2 (two) times daily.    Prenatal 27-1 MG Tabs Take 1 tablet by mouth daily.   Vitamin D3 50 MCG (2000 UT) capsule Take 1 capsule (2,000 Units total) by mouth daily.        Follow-up Information     Armandina Gemma, MD Follow up.   Specialty: General Surgery Why: Please call to confirm appointment date and time Contact information: [address] [place] [address] [phone]         Vira Agar, MD Follow up.   Specialty: Urology Why: Follow up for your kidney stone Contact information: [address] [place] [address] [phone]         Justin Mend, MD Follow up in 1 month(s).   Specialty: Internal Medicine Why: Follow up for kidney function Contact information: [place]  [id number] [place] for [place] at [facility] Follow up in 1 week(s).   Specialty: Obstetrics and Gynecology Why: For routine OB care - your appointment is for 3/14 at 1:55 with Dr. Kandis Ban information: [place] [id number] [phone]  Total discharge time: 1 hour   Signed: Radene Gunning M.D. 09/16/2021, 1:33 PM

## 2021-09-16 NOTE — Plan of Care (Signed)
Extensive education covered at discharge regarding glycemic control, nutrition, self-care, and when to call the provider.  ? ?Problem: Health Behavior/Discharge Planning: ?Goal: Ability to manage health-related needs will improve ?Outcome: Adequate for Discharge ?  ?Problem: Clinical Measurements: ?Goal: Ability to maintain clinical measurements within normal limits will improve ?Outcome: Adequate for Discharge ?Goal: Will remain free from infection ?Outcome: Adequate for Discharge ?Goal: Diagnostic test results will improve ?Outcome: Adequate for Discharge ?Goal: Respiratory complications will improve ?Outcome: Adequate for Discharge ?Goal: Cardiovascular complication will be avoided ?Outcome: Adequate for Discharge ?  ?Problem: Activity: ?Goal: Risk for activity intolerance will decrease ?Outcome: Adequate for Discharge ?  ?Problem: Nutrition: ?Goal: Adequate nutrition will be maintained ?Outcome: Adequate for Discharge ?  ?Problem: Coping: ?Goal: Level of anxiety will decrease ?Outcome: Adequate for Discharge ?  ?Problem: Elimination: ?Goal: Will not experience complications related to bowel motility ?Outcome: Adequate for Discharge ?Goal: Will not experience complications related to urinary retention ?Outcome: Adequate for Discharge ?  ?Problem: Pain Managment: ?Goal: General experience of comfort will improve ?Outcome: Adequate for Discharge ?  ?Problem: Safety: ?Goal: Ability to remain free from injury will improve ?Outcome: Adequate for Discharge ?  ?Problem: Skin Integrity: ?Goal: Risk for impaired skin integrity will decrease ?Outcome: Adequate for Discharge ?  ?Problem: Education: ?Goal: Knowledge of disease or condition will improve ?Outcome: Adequate for Discharge ?Goal: Knowledge of the prescribed therapeutic regimen will improve ?Outcome: Adequate for Discharge ?Goal: Individualized Educational Video(s) ?Outcome: Adequate for Discharge ?  ?Problem: Clinical Measurements: ?Goal: Complications related to  the disease process, condition or treatment will be avoided or minimized ?Outcome: Adequate for Discharge ?  ?

## 2021-09-16 NOTE — Progress Notes (Signed)
Pt incontinent of urine and staff having difficulty keeping strict I&O. Dr Nelda Marseille notified and order for external catheter given. Pt agreeable to plan.  ?

## 2021-09-17 ENCOUNTER — Other Ambulatory Visit: Payer: Self-pay

## 2021-09-17 ENCOUNTER — Telehealth: Payer: Self-pay

## 2021-09-17 ENCOUNTER — Ambulatory Visit (INDEPENDENT_AMBULATORY_CARE_PROVIDER_SITE_OTHER): Payer: Managed Care, Other (non HMO) | Admitting: Obstetrics and Gynecology

## 2021-09-17 VITALS — BP 121/57 | HR 64 | Wt 138.8 lb

## 2021-09-17 DIAGNOSIS — Z748 Other problems related to care provider dependency: Secondary | ICD-10-CM

## 2021-09-17 DIAGNOSIS — O10919 Unspecified pre-existing hypertension complicating pregnancy, unspecified trimester: Secondary | ICD-10-CM

## 2021-09-17 DIAGNOSIS — E039 Hypothyroidism, unspecified: Secondary | ICD-10-CM

## 2021-09-17 DIAGNOSIS — Z3A26 26 weeks gestation of pregnancy: Secondary | ICD-10-CM

## 2021-09-17 DIAGNOSIS — O34219 Maternal care for unspecified type scar from previous cesarean delivery: Secondary | ICD-10-CM

## 2021-09-17 DIAGNOSIS — O2342 Unspecified infection of urinary tract in pregnancy, second trimester: Secondary | ICD-10-CM

## 2021-09-17 DIAGNOSIS — O99012 Anemia complicating pregnancy, second trimester: Secondary | ICD-10-CM

## 2021-09-17 DIAGNOSIS — N179 Acute kidney failure, unspecified: Secondary | ICD-10-CM

## 2021-09-17 DIAGNOSIS — E210 Primary hyperparathyroidism: Secondary | ICD-10-CM

## 2021-09-17 DIAGNOSIS — N200 Calculus of kidney: Secondary | ICD-10-CM

## 2021-09-17 DIAGNOSIS — O24419 Gestational diabetes mellitus in pregnancy, unspecified control: Secondary | ICD-10-CM

## 2021-09-17 DIAGNOSIS — F314 Bipolar disorder, current episode depressed, severe, without psychotic features: Secondary | ICD-10-CM

## 2021-09-17 MED ORDER — METFORMIN HCL 500 MG PO TABS: 500.0000 mg | ORAL_TABLET | Freq: Two times a day (BID) | ORAL | 5 refills | Status: DC

## 2021-09-17 NOTE — Progress Notes (Signed)
? ? ?PRENATAL VISIT NOTE ? ?Subjective:  ?Theresa Mccoy is a 41 y.o. G25K2706 at 19w2dbeing seen today for ongoing prenatal care.  She is currently monitored for the following issues for this high-risk pregnancy and has Bipolar disorder, current episode mixed, moderate ([place]; Generalized anxiety disorder; Essential hypertension; UTI in pregnancy; History of small bowel obstruction; Hypercalcemia; Primary hyperparathyroidism ([place]; AKI (acute kidney injury) ([place]; Renal calculus, right; Severe bipolar I disorder, current or most recent episode depressed ([place]; Supervision of high risk pregnancy, antepartum; Previous cesarean delivery x 2 affecting pregnancy, antepartum; S/P small bowel resection; H/O unilateral salpingectomy; Gestational diabetes mellitus (GDM) affecting pregnancy, antepartum; Pelvic adhesive disease; Chronic hypertension affecting pregnancy; Acquired hypothyroidism; Hypoglycemia associated with gestational diabetes ([place]; Anemia in pregnancy, second trimester; and Assistance needed with transportation on their problem list. ? ?Patient reports no complaints.  Contractions: Not present. Vag. Bleeding: None.  Movement: Present. Denies leaking of fluid.  ? ?The following portions of the patient's history were reviewed and updated as appropriate: allergies, current medications, past family history, past medical history, past social history, past surgical history and problem list.  ? ?Objective:  ? ?Vitals:  ? 09/17/21 1315  ?BP: (!) 121/57  ?Pulse: 64  ?Weight: 138 lb 12.8 oz (63 kg)  ? ? ?Fetal Status: Fetal Heart Rate (bpm): 138   Movement: Present    ? ?General:  Alert, oriented and cooperative. Patient is in no acute distress.  ?Skin: Skin is warm and dry. No rash noted.   ?Cardiovascular: Normal heart rate noted  ?Respiratory: Normal respiratory effort, no problems with respiration noted  ?Abdomen: Soft, gravid, appropriate for gestational age.  Pain/Pressure: Present     ?Pelvic: Cervical exam  deferred        ?Extremities: Normal range of motion.  Edema: Trace  ?Mental Status: Normal mood and affect. Normal behavior. Normal judgment and thought content.  ? ?Assessment and Plan:  ?Pregnancy: [id number] 268w2d1. [redacted] weeks gestation of pregnancy ?Anemia profile b, rpr, hiv next visit ? ?2. Chronic hypertension affecting pregnancy ?Doing well on no meds ? ?3. Anemia in pregnancy, second trimester ?Received IV iron x 1 in the hospital on 3/13.  ?F/u anemia profile next visit and repeat IV iron PRN ? ?CBC Latest Ref Rng & Units 09/16/2021 09/12/2021 09/11/2021  ?WBC 4.0 - 10.5 K/uL 9.3 12.0(H) 13.3(H)  ?Hemoglobin 12.0 - 15.0 g/dL 7.2(L) 9.2(L) 9.9(L)  ?Hematocrit 36.0 - 46.0 % 21.3(L) 28.3(L) 29.4(L)  ?Platelets 150 - 400 K/uL 194 254 287  ? ? ?4. Acquired hypothyroidism ?Decreased to synthroid 50 qday while recently inpatient per endocrine  ?Follow up 4/3 endocrine f/u ? ?5. Gestational diabetes mellitus (GDM) affecting pregnancy, antepartum ?Taken off insulin during hospitalization for hypoglycemia and she did fine just with diet. CBGs slightly elevated at am fasting and 2h PP. Referred to dm education for repeat visit. Follow up next visit and if Cr better and needs meds, can consider metformin and/or daily, long acting insulin only ? ?6. Previous cesarean delivery x 2 affecting pregnancy, antepartum ?D/w pt next visit re: BTL papers ? ?7. Severe bipolar I disorder, current or most recent episode depressed ([place]?Seen by psych during recent hospitalization for hypoglycemia. Patient started on qhs haldol. It's not in epic but she has virtual visit with psych on 3/23 @ noon and this was reviewed with her ? ?8. Renal calculus, right ?Referred to Urology and Renal at hospital d/c. Follow up next visit if these visits are scheduled ? ?9. AKI (  acute kidney injury) ([place]) ?Renal panel next visit per Renal ? ?CMP Latest Ref Rng & Units 09/16/2021 09/15/2021 09/14/2021  ?Glucose 70 - 99 mg/dL 85 83 124(H)  ?BUN 6 - 20 mg/dL  '16 14 9  '$ ?Creatinine 0.44 - 1.00 mg/dL 1.45(H) 1.43(H) 1.31(H)  ?Sodium 135 - 145 mmol/L 138 136 134(L)  ?Potassium 3.5 - 5.1 mmol/L 3.8 3.8 4.1  ?Chloride 98 - 111 mmol/L 116(H) 114(H) 112(H)  ?CO2 22 - 32 mmol/L 17(L) 19(L) 17(L)  ?Calcium 8.9 - 10.3 mg/dL 8.6(L) 9.6 10.1  ?Total Protein 6.5 - 8.1 g/dL 4.0(L) 4.4(L) 4.1(L)  ?Total Bilirubin 0.3 - 1.2 mg/dL 0.4 0.1(L) 0.2(L)  ?Alkaline Phos 38 - 126 U/L 69 73 80  ?AST 15 - 41 U/L 18 13(L) 12(L)  ?ALT 0 - 44 U/L '11 9 10  '$ ? ? ?10. Primary hyperparathyroidism ([place]) ?Continue on outpatient tums per Gen Surg s/p 3/10 right inferior parathyroidectomy ? ?11. Urinary tract infection in mother during second trimester of pregnancy ?Rocephin during hospitalization; ucx neg ? ?12. Assistance needed with transportation ?SW to see today ? ?Preterm labor symptoms and general obstetric precautions including but not limited to vaginal bleeding, contractions, leaking of fluid and fetal movement were reviewed in detail with the patient. ?Please refer to After Visit Summary for other counseling recommendations.  ? ?Return in about 1 week (around 09/24/2021) for md visit, high risk ob, in person or virtual. ? ?Future Appointments  ?Date Time Provider [place]  ?09/17/2021  1:55 PM Aletha Halim, MD [facility]  ?09/25/2021  2:35 PM Griffin Basil, MD [facility]  ?10/01/2021 11:15 AM [facility]  ?10/02/2021  3:15 PM [facility] NURSE [facility] [place]  ?10/02/2021  3:30 PM W[facility] US3 [facility]US [place]  ?10/07/2021 11:30 AM Shamleffer, Melanie Crazier, MD [facility] None  ?10/29/2021 11:00 AM Goldammer, Baruch Merl, LCSW [facility] None  ?10/30/2021  2:30 PM [facility] NURSE [facility]  ?10/30/2021  2:45 PM [facility] US5 [facility]  ? ? ?Aletha Halim, MD ? ?

## 2021-09-17 NOTE — Telephone Encounter (Signed)
Transition Care Management Follow-up Telephone Call ?Date of discharge and from where: 09/16/2021, [facility] specialty care ?How have you been since you were released from the hospital? She stated she is feeling okay.  ?Any questions or concerns? Yes - She is concerned about transportation today for her OB appointment.  She said she will get an Uber/Lyft to take her to the appointment but she is not sure that she has enough money to get home from the clinic. Instructed her to call the clinic and explain her concern because it is important for her to keep her appointment.  ?I contacted Vesta Mixer, LCSW/ [facility] and notified her of patient's need for transportation. She said the front desk should be able to help her and she sent a message to the front desk notifying them of patient's need. I called patient back and she said she already contacted the [facility] office and is waiting for a call back.  ?Items Reviewed: ?Did the pt receive and understand the discharge instructions provided? Yes  ?Medications obtained and verified? Yes  - she said she has all of her medications and has been taking them as instructed. she stated that her blood sugars in the hospital were less than 100. Now she is home and not on insulin and they have been 102-120.   ?Other? No  ?Any new allergies since your discharge? No  ?Dietary orders reviewed? No ?Do you have support at home? Yes  ? ?Home Care and Equipment/Supplies: ?Were home health services ordered? no ?If so, what is the name of the agency? N/a  ?Has the agency set up a time to come to the patient's home? not applicable ?Were any new equipment or medical supplies ordered?  No ?What is the name of the medical supply agency? N/a ?Were you able to get the supplies/equipment? not applicable ?Do you have any questions related to the use of the equipment or supplies? No ? ?Functional Questionnaire: (I = Independent and D = Dependent) ?ADLs: independent. She said she has cane but it is broken.   ? ? ?Follow up appointments reviewed: ? ?PCP Hospital f/u appt confirmed?  She has a lot of other appointments scheduled and will call [facility] to schedule an appointment when she is ready to see PCP   ?[facility] f/u appt confirmed? Yes  Scheduled to see OB/GYN today - 09/17/2021.   ?Are transportation arrangements needed? Yes - see above notes regarding transportation today. I informed patient that she is eligible for Medicaid transportation to medical appointments in the future but they require a 2 day notice.  ?If their condition worsens, is the pt aware to call PCP or go to the Emergency Dept.? Yes ?Was the patient provided with contact information for the PCP's office or ED? Yes ?Was to pt encouraged to call back with questions or concerns? Yes ? ?

## 2021-09-17 NOTE — Patient Instructions (Signed)
You have a virtual appointment on Thursday March 23 at noon for your bipolar . They will call you. ?

## 2021-09-18 ENCOUNTER — Other Ambulatory Visit: Payer: Self-pay | Admitting: Family Medicine

## 2021-09-18 ENCOUNTER — Telehealth: Payer: Self-pay

## 2021-09-18 DIAGNOSIS — N200 Calculus of kidney: Secondary | ICD-10-CM

## 2021-09-18 NOTE — Telephone Encounter (Addendum)
-----   Message from Aletha Halim, MD sent at 09/18/2021  1:09 PM EDT ----- ?Regarding: call patient to clarify meds ?I called the pharmacy and patient, unfortunately, had old active prescriptions in the computer and when she went to pick up meds, they gave her the old prescriptions ? ?Patient picked up synthroid 75 qday and metformin  ? ?When she was discharged from the hospital on Monday, the hospital pharmacy gave her new synthroid at a lower dose (50 qday) ? ?Please call her and tell her to not take any metformin and we'll follow up her sugars at her visit on 3/22 ?Also, tell her to take the synthroid 50 and not the 75. Thanks! ? ?-------------------------------------------------------------------------------- ? ?Called pt to follow up; reviewed dose of Synthroid and not to take Metformin.  ?

## 2021-09-18 NOTE — Telephone Encounter (Addendum)
-----   Message from Radene Gunning, MD sent at 09/16/2021 11:08 AM EDT ----- ?Regarding: Urology Appt ?Can you get her an appointment with urology please? ? ?I spoke with Dr. Cain Sieve who recommends she see them eventually for her right stone and hydro sometime in the next 2-3 likely based on her entering her third trimester.  ? ?Thank you!  ? ?pad ? ? ?[place] office notified to send referral records. External referral placed. ?

## 2021-09-19 ENCOUNTER — Encounter (HOSPITAL_COMMUNITY): Payer: Self-pay

## 2021-09-19 ENCOUNTER — Inpatient Hospital Stay (HOSPITAL_COMMUNITY)
Admission: EM | Admit: 2021-09-19 | Discharge: 2021-09-19 | Disposition: A | Discharge status: Home / Self Care | Payer: Commercial Managed Care - HMO | Attending: Obstetrics and Gynecology | Admitting: Obstetrics and Gynecology

## 2021-09-19 ENCOUNTER — Other Ambulatory Visit: Payer: Self-pay

## 2021-09-19 DIAGNOSIS — O26832 Pregnancy related renal disease, second trimester: Secondary | ICD-10-CM | POA: Diagnosis not present

## 2021-09-19 DIAGNOSIS — O26892 Other specified pregnancy related conditions, second trimester: Secondary | ICD-10-CM | POA: Diagnosis present

## 2021-09-19 DIAGNOSIS — N201 Calculus of ureter: Secondary | ICD-10-CM | POA: Diagnosis not present

## 2021-09-19 DIAGNOSIS — N23 Unspecified renal colic: Secondary | ICD-10-CM | POA: Diagnosis not present

## 2021-09-19 DIAGNOSIS — Z3A26 26 weeks gestation of pregnancy: Secondary | ICD-10-CM | POA: Diagnosis not present

## 2021-09-19 DIAGNOSIS — O10919 Unspecified pre-existing hypertension complicating pregnancy, unspecified trimester: Secondary | ICD-10-CM | POA: Diagnosis not present

## 2021-09-19 DIAGNOSIS — E039 Hypothyroidism, unspecified: Secondary | ICD-10-CM | POA: Diagnosis not present

## 2021-09-19 DIAGNOSIS — O99012 Anemia complicating pregnancy, second trimester: Secondary | ICD-10-CM | POA: Diagnosis not present

## 2021-09-19 LAB — BASIC METABOLIC PANEL
Anion gap: 7 (ref 5–15)
BUN: 14 mg/dL (ref 6–20)
CO2: 20 mmol/L — ABNORMAL LOW (ref 22–32)
Calcium: 8.2 mg/dL — ABNORMAL LOW (ref 8.9–10.3)
Chloride: 110 mmol/L (ref 98–111)
Creatinine, Ser: 1.03 mg/dL — ABNORMAL HIGH (ref 0.44–1.00)
GFR, Estimated: >60 mL/min (ref >60)
Glucose, Bld: 73 mg/dL (ref 70–99)
Potassium: 4 mmol/L (ref 3.5–5.1)
Sodium: 137 mmol/L (ref 135–145)

## 2021-09-19 LAB — CBC WITH DIFFERENTIAL/PLATELET
Abs Immature Granulocytes: 0.06 10*3/uL (ref 0.00–0.07)
Basophils Absolute: 0 10*3/uL (ref 0.0–0.1)
Basophils Relative: 0 %
Eosinophils Absolute: 0.1 10*3/uL (ref 0.0–0.5)
Eosinophils Relative: 1 %
HCT: 25.7 % — ABNORMAL LOW (ref 36.0–46.0)
Hemoglobin: 8.5 g/dL — ABNORMAL LOW (ref 12.0–15.0)
Immature Granulocytes: 1 %
Lymphocytes Relative: 22 %
Lymphs Abs: 2.3 10*3/uL (ref 0.7–4.0)
MCH: 31.4 pg (ref 26.0–34.0)
MCHC: 33.1 g/dL (ref 30.0–36.0)
MCV: 94.8 fL (ref 80.0–100.0)
Monocytes Absolute: 0.6 10*3/uL (ref 0.1–1.0)
Monocytes Relative: 6 %
Neutro Abs: 7.1 10*3/uL (ref 1.7–7.7)
Neutrophils Relative %: 70 %
Platelets: 284 10*3/uL (ref 150–400)
RBC: 2.71 MIL/uL — ABNORMAL LOW (ref 3.87–5.11)
RDW: 13.4 % (ref 11.5–15.5)
WBC: 10.1 10*3/uL (ref 4.0–10.5)
nRBC: 0 % (ref 0.0–0.2)

## 2021-09-19 NOTE — Progress Notes (Signed)
IV team at bedside with [place] to start IV. ?

## 2021-09-19 NOTE — MAU Provider Note (Signed)
?History  ?  ? ?CSN: [id number] ? ?Arrival date and time: 09/19/21 1055 ? ? Event Date/Time  ? First Provider Initiated Contact with Patient 09/19/21 1331   ?  ? ?Chief Complaint  ?Patient presents with  ? Abdominal Pain  ? ?41 year old G12 P2092 at 26.[redacted] weeks gestation presenting from [facility] for right flank pain.  Patient has known right severe hydronephrosis with large calculus and a recent AKI, and was discharged from the hospital 3 days ago.  Describes pain as sharp and constant in the right flank.  Rates 10 out of 10.  Has taken Tylenol but does not feel any relief.  Denies contractions, VB, and LOF.  Reports good FM.  ? ? ?OB History   ? ? Gravida  ?12  ? Para  ?2  ? Term  ?2  ? Preterm  ?   ? AB  ?9  ? Living  ?2  ?  ? ? SAB  ?8  ? IAB  ?   ? Ectopic  ?1  ? Multiple  ?   ? Live Births  ?2  ?   ?  ?  ? ? ?Past Medical History:  ?Diagnosis Date  ? AKI (acute kidney injury) ([place]) 01/22/2020  ? Anemia   ? Depression   ? Diabetes mellitus without complication ([place])   ? Hypernatremia 01/22/2020  ? Hyperthyroidism   ? PTSD (post-traumatic stress disorder)   ? SBO (small bowel obstruction) ([place]) 07/07/2019  ? Sepsis ([place]) 01/22/2020  ? ? ?Past Surgical History:  ?Procedure Laterality Date  ? BOWEL RESECTION N/A 07/08/2019  ? Procedure: Small Bowel Resection;  Surgeon: Clovis Riley, MD;  Location: [place];  Service: General;  Laterality: N/A;  ? CESAREAN SECTION    ? DILATION AND CURETTAGE OF UTERUS    ? HAND SURGERY Left   ? LAPAROTOMY N/A 07/08/2019  ? Procedure: EXPLORATORY LAPAROTOMY;  Surgeon: Clovis Riley, MD;  Location: [place];  Service: General;  Laterality: N/A;  ? OOPHORECTOMY Right   ? PARATHYROIDECTOMY Right 09/13/2021  ? Procedure: RIGHT INFERIOR PARATHYROIDECTOMY;  Surgeon: Armandina Gemma, MD;  Location: [place];  Service: General;  Laterality: Right;  ? SALPINGECTOMY    ? ? ?Family History  ?Problem Relation Age of Onset  ? Diabetes Brother   ? Asthma Paternal Grandmother   ? ? ?Social History   ? ?Tobacco Use  ? Smoking status: Some Days  ?  Packs/day: 0.25  ?  Types: Cigarettes  ? Smokeless tobacco: Never  ?Vaping Use  ? Vaping Use: Never used  ?Substance Use Topics  ? 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 Drug use: Yes  ?  Types: Marijuana  ?  Comment: Occasionally , last use 02 Sep 2021  ? ? ?Allergies:  ?Allergies  ?Allergen Reactions  ? Codeine Other (See Comments)  ?  Constipation ?  ? ? ?No medications prior to admission.  ? ? ?Review of Systems  ?Constitutional:  Negative for chills and fever.  ?Gastrointestinal:  Negative for abdominal pain.  ?Genitourinary:  Positive for flank pain. Negative for vaginal bleeding and vaginal discharge.  ?Physical Exam  ? ?Blood pressure 117/76, pulse 84, temperature 97.9 ?F (36.6 ?C), temperature source Oral, resp. rate 20, SpO2 100 %. ? ?Physical Exam ?Vitals and nursing note reviewed. Exam conducted with a chaperone present.  ?Constitutional:   ?   General: She is not in acute distress (appears comfortable). ?   Appearance: Normal appearance.  ?  HENT:  ?   Head: Normocephalic and atraumatic.  ?Cardiovascular:  ?   Rate and Rhythm: Normal rate.  ?Pulmonary:  ?   Effort: Pulmonary effort is normal. No respiratory distress.  ?Abdominal:  ?   Palpations: Abdomen is soft.  ?   Tenderness: There is no abdominal tenderness.  ?   Comments: gravid  ?Genitourinary: ?   Comments: VE closed/thick ?Musculoskeletal:     ?   General: Normal range of motion.  ?   Cervical back: Normal range of motion.  ?Skin: ?   General: Skin is warm and dry.  ?Neurological:  ?   General: No focal deficit present.  ?   Mental Status: She is alert and oriented to person, place, and time.  ?Psychiatric:     ?   Mood and Affect: Mood normal.     ?   Behavior: Behavior normal.  ?EFM: 145 bpm, mod variability, + accels, no decels ?Toco: none ? ?Results for orders placed or performed during the hospital encounter of 09/19/21 (from the past 24 hour(s))  ?Basic metabolic panel     Status:  Abnormal  ? Collection Time: 09/19/21 11:28 AM  ?Result Value Ref Range  ? Sodium 137 135 - 145 mmol/L  ? Potassium 4.0 3.5 - 5.1 mmol/L  ? Chloride 110 98 - 111 mmol/L  ? CO2 20 (L) 22 - 32 mmol/L  ? Glucose, Bld 73 70 - 99 mg/dL  ? BUN 14 6 - 20 mg/dL  ? Creatinine, Ser 1.03 (H) 0.44 - 1.00 mg/dL  ? Calcium 8.2 (L) 8.9 - 10.3 mg/dL  ? GFR, Estimated >60 >60 mL/min  ? Anion gap 7 5 - 15  ?CBC with Differential     Status: Abnormal  ? Collection Time: 09/19/21 11:28 AM  ?Result Value Ref Range  ? WBC 10.1 4.0 - 10.5 K/uL  ? RBC 2.71 (L) 3.87 - 5.11 MIL/uL  ? Hemoglobin 8.5 (L) 12.0 - 15.0 g/dL  ? HCT 25.7 (L) 36.0 - 46.0 %  ? MCV 94.8 80.0 - 100.0 fL  ? MCH 31.4 26.0 - 34.0 pg  ? MCHC 33.1 30.0 - 36.0 g/dL  ? RDW 13.4 11.5 - 15.5 %  ? Platelets 284 150 - 400 K/uL  ? nRBC 0.0 0.0 - 0.2 %  ? Neutrophils Relative % 70 %  ? Neutro Abs 7.1 1.7 - 7.7 K/uL  ? Lymphocytes Relative 22 %  ? Lymphs Abs 2.3 0.7 - 4.0 K/uL  ? Monocytes Relative 6 %  ? Monocytes Absolute 0.6 0.1 - 1.0 K/uL  ? Eosinophils Relative 1 %  ? Eosinophils Absolute 0.1 0.0 - 0.5 K/uL  ? Basophils Relative 0 %  ? Basophils Absolute 0.0 0.0 - 0.1 K/uL  ? Immature Granulocytes 1 %  ? Abs Immature Granulocytes 0.06 0.00 - 0.07 K/uL  ? ?MAU Course  ?Procedures ? ?MDM ?Labs reviewed. Consult with Dr. Abner Greenspan he saw pt in office this am and states plan is neph tube in IR to be scheduled.  Patient does not appear to be in acute distress and without signs of PTL.  She is stable for discharge home. ? ?Assessment and Plan  ? ?1. Ureteral colic   ?2. Pregnancy, unspecified gestational age   ?3. [redacted] weeks gestation of pregnancy   ?4. Rt flank pain   ? ?Discharge home ?[facility] ?Tylenol heat as needed ?Return precautions ? ?Allergies as of 09/19/2021   ? ?   Reactions  ?  Codeine Other (See Comments)  ? Constipation  ? ?  ? ?  ?Medication List  ?  ? ?TAKE these medications   ? ?Accu-Chek Softclix Lancets lancets ?Use four times daily as  instructed. ?  ?acetaminophen 325 MG tablet ?Commonly known as: TYLENOL ?Take 2 tablets (650 mg total) by mouth every 4 (four) hours as needed (for pain scale < 4  OR  temperature  >/=  100.5 F). ?  ?aspirin EC 81 MG tablet ?Take 1 tablet (81 mg total) by mouth daily. Swallow whole. ?  ?Blood Pressure Monitor Automat Devi ?1 Device by Does not apply route daily. Automatic blood pressure cuff with regular cuff. Blood pressure to be monitored regularly at home. ICD-10 code: O09.90. ?  ?calcium carbonate 500 MG chewable tablet ?Commonly known as: TUMS - dosed in mg elemental calcium ?Chew 5 tablets (1,000 mg of elemental calcium total) by mouth 2 (two) times daily. ?  ?glucose blood test strip ?Use as instructed ?  ?haloperidol 1 MG tablet ?Commonly known as: HALDOL ?Take 2 tablets (2 mg total) by mouth at bedtime. ?  ?levothyroxine 50 MCG tablet ?Commonly known as: SYNTHROID ?Take 1 tablet (50 mcg total) by mouth daily at 6 (six) AM. ?  ?polyethylene glycol 17 g packet ?Commonly known as: MIRALAX / GLYCOLAX ?Take 17 g by mouth 2 (two) times daily. ?  ?Prenatal 27-1 MG Tabs ?Take 1 tablet by mouth daily. ?  ?Vitamin D3 50 MCG (2000 UT) capsule ?Take 1 capsule (2,000 Units total) by mouth daily. ?  ? ?  ? ? ? ?Julianne Handler, CNM ?09/19/2021, 2:52 PM  ?

## 2021-09-19 NOTE — MAU Note (Signed)
Theresa Mccoy is a 41 y.o. at 29w4dhere in MAU reporting: kidney stone pain for 2 weeks that has worsened today.  ? ?Onset of complaint: 2 weeks ago  ? ?Pain score: 10/10 in her right back, in between her legs, and in her left arm  ?Vitals:  ? 09/19/21 1145 09/19/21 1200  ?BP:    ?Pulse: 76 71  ?Resp: 16 16  ?Temp:    ?SpO2: 100% 100%  ?   ?Lab orders placed from triage:  no new orders  ?

## 2021-09-19 NOTE — ED Provider Notes (Signed)
?[facility] DEPT ?Provider Note ? ? ?CSN: [id number] ?Arrival date & time: 09/19/21  1055 ? ?  ? ?History ? ?Chief Complaint  ?Patient presents with  ? Abdominal Pain  ? ? ?Theresa Mccoy is a 41 y.o. female presented emergency department with abdominal pain.  The patient was diagnosed with a suspected 1.5 cm right-sided ureteral stone on outpatient ultrasound 1 week ago.  Because she is in her second trimester pregnancy, she was best deemed to be managed postdelivery by the urologist.  She reports that the pain in her right flank is worsened since then.  She has been taking Tylenol alone for pain.  She reports oxycodone makes her very constipated. ? ?HPI ? ?  ? ?Home Medications ?Prior to Admission medications   ?Medication Sig Start Date End Date Taking? Authorizing Provider  ?Accu-Chek Softclix Lancets lancets Use four times daily as instructed. 07/23/21   Clarnce Flock, MD  ?acetaminophen (TYLENOL) 325 MG tablet Take 2 tablets (650 mg total) by mouth every 4 (four) hours as needed (for pain scale < 4  OR  temperature  >/=  100.5 F). 09/16/21   Radene Gunning, MD  ?aspirin EC 81 MG tablet Take 1 tablet (81 mg total) by mouth daily. Swallow whole. ?Patient not taking: Reported on 09/17/2021 07/19/21   Clarnce Flock, MD  ?Blood Pressure Monitoring (BLOOD PRESSURE MONITOR AUTOMAT) DEVI 1 Device by Does not apply route daily. Automatic blood pressure cuff with regular cuff. Blood pressure to be monitored regularly at home. ICD-10 code: O63.90. 05/17/19   Donnamae Jude, MD  ?calcium carbonate (TUMS - DOSED IN MG ELEMENTAL CALCIUM) 500 MG chewable tablet Chew 5 tablets (1,000 mg of elemental calcium total) by mouth 2 (two) times daily. 09/16/21   Radene Gunning, MD  ?Cholecalciferol (VITAMIN D3) 50 MCG (2000 UT) capsule Take 1 capsule (2,000 Units total) by mouth daily. 08/13/21   Shamleffer, Melanie Crazier, MD  ?glucose blood test strip Use as instructed 07/23/21   Clarnce Flock, MD   ?haloperidol (HALDOL) 1 MG tablet Take 2 tablets (2 mg total) by mouth at bedtime. 09/16/21   Radene Gunning, MD  ?levothyroxine (SYNTHROID) 50 MCG tablet Take 1 tablet (50 mcg total) by mouth daily at 6 (six) AM. 09/17/21   Aletha Halim, MD  ?polyethylene glycol (MIRALAX / GLYCOLAX) 17 g packet Take 17 g by mouth 2 (two) times daily. 08/05/21   Aletha Halim, MD  ?Prenatal 27-1 MG TABS Take 1 tablet by mouth daily. ?Patient not taking: Reported on 09/17/2021 06/21/21   Constant, Vickii Chafe, MD  ?   ? ?Allergies    ?Codeine   ? ?Review of Systems   ?Review of Systems ? ?Physical Exam ?Updated Vital Signs ?BP (!) 105/55   Pulse 71   Temp 97.7 ?F (36.5 ?C) (Oral)   Resp 16   LMP  (Approximate)   SpO2 100%  ?Physical Exam ?Constitutional:   ?   General: She is not in acute distress. ?HENT:  ?   Head: Normocephalic and atraumatic.  ?Eyes:  ?   Conjunctiva/sclera: Conjunctivae normal.  ?   Pupils: Pupils are equal, round, and reactive to light.  ?Cardiovascular:  ?   Rate and Rhythm: Normal rate and regular rhythm.  ?Pulmonary:  ?   Effort: Pulmonary effort is normal. No respiratory distress.  ?Abdominal:  ?   General: There is no distension.  ?   Tenderness: There is no abdominal tenderness.  ?   Comments: Gravid  uterus  ?Skin: ?   General: Skin is warm and dry.  ?Neurological:  ?   General: No focal deficit present.  ?   Mental Status: She is alert. Mental status is at baseline.  ?Psychiatric:     ?   Mood and Affect: Mood normal.     ?   Behavior: Behavior normal.  ? ? ?ED Results / Procedures / Treatments   ?Labs ?(all labs ordered are listed, but only abnormal results are displayed) ?Labs Reviewed  ?BASIC METABOLIC PANEL - Abnormal; Notable for the following components:  ?    Result Value  ? CO2 20 (*)   ? Creatinine, Ser 1.03 (*)   ? Calcium 8.2 (*)   ? All other components within normal limits  ?CBC WITH DIFFERENTIAL/PLATELET - Abnormal; Notable for the following components:  ? RBC 2.71 (*)   ? Hemoglobin 8.5  (*)   ? HCT 25.7 (*)   ? All other components within normal limits  ?URINALYSIS, ROUTINE W REFLEX MICROSCOPIC  ? ? ?EKG ?None ? ?Radiology ?No results found. ? ?Procedures ?Procedures  ? ? ?Medications Ordered in ED ?Medications - No data to display ? ?ED Course/ Medical Decision Making/ A&P ?Clinical Course as of 09/19/21 1232  ?Thu Sep 19, 2021  ?1231 Pt accepted for transfer to MAU Dr Mendel Corning.  No medical emergency identified on labs.  Cr improved from prior.  No fever or leukocytosis.  Encouraging to provide UA, but I feel she is reasonably safe for transfer to MAU to discuss or consider pain control options.  I do not see an indication for emergent repeat ultrasound as I suspect she continues having an obstructing renal stone. [MT]  ?  ?Clinical Course User Index ?[MT] Wyvonnia Dusky, MD  ? ?                        ?Medical Decision Making ?Amount and/or Complexity of Data Reviewed ?Labs: ordered. ? ?Risk ?Decision regarding hospitalization. ? ? ?Patient is with flank pain, suspect that this is related to hydronephrosis and a proximal obstructing 1.5 cm ureteral stone.  I personally reviewed her outpatient and external records including her ultrasound 1 week ago.  Cr 1.45 on 09/16/21.   ? ?High risk of complications due to her pregnancy. ? ?Clinical Course as of 09/19/21 1232  ?Thu Sep 19, 2021  ?1231 Pt accepted for transfer to MAU Dr Mendel Corning.  No medical emergency identified on labs.  Cr improved from prior.  No fever or leukocytosis.  Encouraging to provide UA, but I feel she is reasonably safe for transfer to MAU to discuss or consider pain control options.  I do not see an indication for emergent repeat ultrasound as I suspect she continues having an obstructing renal stone. [MT]  ?  ?Clinical Course User Index ?[MT] Wyvonnia Dusky, MD  ? ? ?Doubt sepsis, appendicitis. ? ? ? ? ? ? ? ?Final Clinical Impression(s) / ED Diagnoses ?Final diagnoses:  ?Ureteral colic  ?Pregnancy, unspecified gestational age   ? ? ?Rx / DC Orders ?ED Discharge Orders   ? ? None  ? ?  ? ? ?  ?Wyvonnia Dusky, MD ?09/19/21 1232 ? ?

## 2021-09-19 NOTE — Telephone Encounter (Signed)
error 

## 2021-09-19 NOTE — ED Triage Notes (Signed)
Pt presents with c/o possible kidney stone/blockage. Pt reports she has been informed that she will have surgery after she delivers her baby, approx [redacted] weeks pregnant per pt. Pt does have swelling noted to both feet.  ?

## 2021-09-19 NOTE — Progress Notes (Signed)
G12P2 at 26 4/6 weeks reports to [facility] for constant back pain.  States has kidney stones and subsequent blockage.  Had gone to see surgeon about a nephrostomy tube placement today but "the pain is so bad now".  Receives [facility] with Family Medicine at [facility].  Has had two previous C/S.  Monitors applied.  No complaints of bleeding or leaking.  Abdomen palpates soft and nontender.   ?

## 2021-09-19 NOTE — Progress Notes (Signed)
Dr Lamount Cohen updated on patient status.  POC to transfer to MAU for further evaluation.  Talked with [facility] MD regarding POC.  Will obtain urine sample prior to transfer.  Cleared from [facility] in ED. ?Talked with MAU Charge RN regarding transfer. ? ?Pearletha Forge RNC-OB RROB [phone] ?

## 2021-09-20 ENCOUNTER — Other Ambulatory Visit (HOSPITAL_COMMUNITY): Payer: Self-pay | Admitting: Urology

## 2021-09-20 DIAGNOSIS — N1339 Other hydronephrosis: Secondary | ICD-10-CM

## 2021-09-20 LAB — GLUCOSE, CAPILLARY: Glucose-Capillary: 59 mg/dL — ABNORMAL LOW (ref 70–99)

## 2021-09-25 ENCOUNTER — Ambulatory Visit (INDEPENDENT_AMBULATORY_CARE_PROVIDER_SITE_OTHER): Payer: Managed Care, Other (non HMO) | Admitting: Obstetrics and Gynecology

## 2021-09-25 ENCOUNTER — Other Ambulatory Visit: Payer: Self-pay

## 2021-09-25 VITALS — BP 107/68 | HR 88 | Wt 132.2 lb

## 2021-09-25 DIAGNOSIS — Z3A27 27 weeks gestation of pregnancy: Secondary | ICD-10-CM

## 2021-09-25 DIAGNOSIS — O24419 Gestational diabetes mellitus in pregnancy, unspecified control: Secondary | ICD-10-CM

## 2021-09-25 DIAGNOSIS — O10919 Unspecified pre-existing hypertension complicating pregnancy, unspecified trimester: Secondary | ICD-10-CM

## 2021-09-25 DIAGNOSIS — O0990 Supervision of high risk pregnancy, unspecified, unspecified trimester: Secondary | ICD-10-CM

## 2021-09-25 DIAGNOSIS — E210 Primary hyperparathyroidism: Secondary | ICD-10-CM

## 2021-09-25 DIAGNOSIS — O99012 Anemia complicating pregnancy, second trimester: Secondary | ICD-10-CM

## 2021-09-25 DIAGNOSIS — O34219 Maternal care for unspecified type scar from previous cesarean delivery: Secondary | ICD-10-CM

## 2021-09-25 NOTE — Progress Notes (Signed)
? ?  PRENATAL VISIT NOTE ? ?Subjective:  ?Theresa Mccoy is a 41 y.o. [id number] at 65w3dbeing seen today for ongoing prenatal care.  She is currently monitored for the following issues for this high-risk pregnancy and has Bipolar disorder, current episode mixed, moderate ([place]; Generalized anxiety disorder; Essential hypertension; UTI in pregnancy; History of small bowel obstruction; Hypercalcemia; Primary hyperparathyroidism ([place]; AKI (acute kidney injury) ([place]; Renal calculus, right; Severe bipolar I disorder, current or most recent episode depressed ([place]; Supervision of high risk pregnancy, antepartum; Previous cesarean delivery x 2 affecting pregnancy, antepartum; S/P small bowel resection; H/O unilateral salpingectomy; Gestational diabetes mellitus (GDM) affecting pregnancy, antepartum; Pelvic adhesive disease; Chronic hypertension affecting pregnancy; Acquired hypothyroidism; Hypoglycemia associated with gestational diabetes ([place]; Anemia in pregnancy, second trimester; and Assistance needed with transportation on their problem list. ? ?Patient doing well with no acute concerns today. She reports no complaints.  Contractions: Not present. Vag. Bleeding: Bloody Show.  Movement: Present. Denies leaking of fluid.  ? ?Speaking with the patient, she states urology plans to place a nephrostomy tube on Friday.  Long discussion of birth control with patient.  She does not desire tubal sterilization.  Discussed IUD or nexplanon.  Pt appears to have some limited comprehension of long term contraception. ? ?The following portions of the patient's history were reviewed and updated as appropriate: allergies, current medications, past family history, past medical history, past social history, past surgical history and problem list. Problem list updated. ? ?Objective:  ? ?Vitals:  ? 09/25/21 1413  ?BP: 107/68  ?Pulse: 88  ?Weight: 132 lb 3.2 oz (60 kg)  ? ? ?Fetal Status: Fetal Heart Rate (bpm): 150 Fundal Height: 28 cm  Movement: Present    ? ?General:  Alert, oriented and cooperative. Patient is in no acute distress.  ?Skin: Skin is warm and dry. No rash noted.   ?Cardiovascular: Normal heart rate noted  ?Respiratory: Normal respiratory effort, no problems with respiration noted  ?Abdomen: Soft, gravid, appropriate for gestational age.  Pain/Pressure: Present     ?Pelvic: Cervical exam deferred        ?Extremities: Normal range of motion.  Edema: None  ?Mental Status:  Normal mood and affect. Normal behavior. Normal judgment and thought content.  ? ?Assessment and Plan:  ?Pregnancy: [id number] 232w3d ?1. [redacted] weeks gestation of pregnancy ? ? ?2. Chronic hypertension affecting pregnancy ?Good control with current medications ? ?3. Primary hyperparathyroidism ([place]?Continue tums as previous ? ?4. Gestational diabetes mellitus (GDM) affecting pregnancy, antepartum ?Pt did not bring blood sugars today, will reassess at next visit ?Fetal u/s on 10/02/21 ? ?5. Supervision of high risk pregnancy, antepartum ?Continue routine prenatal care, pt will get nephrostomy tube in the hospital with urology and IR ?Third trimester labs and anemia panel ordered ?- RPR ?- HIV antibody (with reflex) ?- Anemia Profile B ? ?6. Previous cesarean delivery x 2 affecting pregnancy, antepartum ?Will schedule repeat cesarean section ? ?7. Anemia in pregnancy, second trimester ? ?- Anemia Profile B ? ?Preterm labor symptoms and general obstetric precautions including but not limited to vaginal bleeding, contractions, leaking of fluid and fetal movement were reviewed in detail with the patient. ? ?Please refer to After Visit Summary for other counseling recommendations.  ? ?[facility] 2 [facility] (around [facility]09[facility]2021[facility] ? ? ?LaLynnda ShieldsMD ?Faculty Attending ?[facility]  ?

## 2021-09-26 ENCOUNTER — Other Ambulatory Visit: Payer: Self-pay | Admitting: Obstetrics and Gynecology

## 2021-09-26 ENCOUNTER — Other Ambulatory Visit: Payer: Self-pay | Admitting: Radiology

## 2021-09-26 LAB — RPR: RPR Ser Ql: NONREACTIVE

## 2021-09-26 LAB — ANEMIA PROFILE B
Basophils Absolute: 0 10*3/uL (ref 0.0–0.2)
Basos: 0 %
EOS (ABSOLUTE): 0.2 10*3/uL (ref 0.0–0.4)
Eos: 2 %
Ferritin: 706 ng/mL — ABNORMAL HIGH (ref 15–150)
Folate: 10.4 ng/mL (ref >3.0)
Hematocrit: 23.2 % — ABNORMAL LOW (ref 34.0–46.6)
Hemoglobin: 7.7 g/dL — ABNORMAL LOW (ref 11.1–15.9)
Immature Grans (Abs): 0.1 10*3/uL (ref 0.0–0.1)
Immature Granulocytes: 1 %
Iron Saturation: 44 % (ref 15–55)
Iron: 92 ug/dL (ref 27–159)
Lymphocytes Absolute: 2 10*3/uL (ref 0.7–3.1)
Lymphs: 19 %
MCH: 31.3 pg (ref 26.6–33.0)
MCHC: 33.2 g/dL (ref 31.5–35.7)
MCV: 94 fL (ref 79–97)
Monocytes Absolute: 0.7 10*3/uL (ref 0.1–0.9)
Monocytes: 6 %
Neutrophils Absolute: 7.3 10*3/uL — ABNORMAL HIGH (ref 1.4–7.0)
Neutrophils: 72 %
Platelets: 326 10*3/uL (ref 150–450)
RBC: 2.46 x10E6/uL — CL (ref 3.77–5.28)
RDW: 12.3 % (ref 11.7–15.4)
Retic Ct Pct: 1.7 % (ref 0.6–2.6)
Total Iron Binding Capacity: 210 ug/dL — ABNORMAL LOW (ref 250–450)
UIBC: 118 ug/dL — ABNORMAL LOW (ref 131–425)
Vitamin B-12: 195 pg/mL — ABNORMAL LOW (ref 232–1245)
WBC: 10.2 10*3/uL (ref 3.4–10.8)

## 2021-09-26 LAB — HIV ANTIBODY (ROUTINE TESTING W REFLEX): HIV Screen 4th Generation wRfx: NONREACTIVE

## 2021-09-26 NOTE — H&P (Addendum)
? ?Chief Complaint: ?Patient was seen in consultation today for image guided right PCN placement at the request of Gay,Matthew R ? ?Referring Physician(s): Gay,Matthew R ? ?Supervising Physician: Aletta Edouard ? ?Patient Status: [place] ? ?History of Present Illness: ?Theresa Mccoy is a 41 y.o. female with PMHs of DM, depression, Bipolar disorder, SBO s/p small bowel resection on 07/08/19, hyperthyroidism s/p right inferior parathyroidectomy on 09/13/21, currently pregnant 27w 4d and right hydronephrosis with 1.5 cm right distal ureteral stone.  ?Patient presented to [facility] ED on 09/11/21 and was hospitalized due to controlled GDM and active urinary tract infection. General surgery was consulted and patient underwent parathyroidectomy on 09/13/21, patient also underwent US renal on 3/13 due to AKI, which showed:  ? ?Similar severe right hydronephrosis and cortical thinning. ?Suspected 1.5 cm calculus in the distal right ureter ? ?Patient was discharged on 09/16/21 but unfortunately re- presented to ED on 3/16 due to persistent right flank pain.  ?Patient had an outpatient consultation visit with Dr. Abner Greenspan, who recommended right PCN placement and routine exchange for management of the right hydronephrosis and ureteral calculi until she delivers her child. After thorough discussion and shared decision making, patient decided to proceed with the right PCN placement.  ? ?IR was requested for image guided right PCN placement.  ? ?Patient laying in bed, not in acute distress.  ?Denise headache, fever, chills, shortness of breath, cough, chest pain, abdominal pain, nausea ,vomiting, and bleeding. ? ?Past Medical History:  ?Diagnosis Date  ? AKI (acute kidney injury) ([place]) 01/22/2020  ? Anemia   ? Depression   ? Diabetes mellitus without complication ([place])   ? Hypernatremia 01/22/2020  ? Hyperthyroidism   ? PTSD (post-traumatic stress disorder)   ? SBO (small bowel obstruction) ([place]) 07/07/2019  ? Sepsis ([place]) 01/22/2020   ? ? ?Past Surgical History:  ?Procedure Laterality Date  ? BOWEL RESECTION N/A 07/08/2019  ? Procedure: Small Bowel Resection;  Surgeon: Clovis Riley, MD;  Location: [facility];  Service: General;  Laterality: N/A;  ? CESAREAN SECTION    ? DILATION AND CURETTAGE OF UTERUS    ? HAND SURGERY Left   ? LAPAROTOMY N/A 07/08/2019  ? Procedure: EXPLORATORY LAPAROTOMY;  Surgeon: Clovis Riley, MD;  Location: [facility];  Service: General;  Laterality: N/A;  ? OOPHORECTOMY Right   ? PARATHYROIDECTOMY Right 09/13/2021  ? Procedure: RIGHT INFERIOR PARATHYROIDECTOMY;  Surgeon: Armandina Gemma, MD;  Location: [facility];  Service: General;  Laterality: Right;  ? SALPINGECTOMY    ? ? ?Allergies: ?Codeine ? ?Medications: ?Prior to Admission medications   ?Medication Sig Start Date End Date Taking? Authorizing Provider  ?Accu-Chek Softclix Lancets lancets Use four times daily as instructed. 07/23/21   Clarnce Flock, MD  ?acetaminophen (TYLENOL) 325 MG tablet Take 2 tablets (650 mg total) by mouth every 4 (four) hours as needed (for pain scale < 4  OR  temperature  >/=  100.5 F). 09/16/21   Radene Gunning, MD  ?aspirin EC 81 MG tablet Take 1 tablet (81 mg total) by mouth daily. Swallow whole. ?Patient not taking: Reported on 09/17/2021 07/19/21   Clarnce Flock, MD  ?Blood Pressure Monitoring (BLOOD PRESSURE MONITOR AUTOMAT) DEVI 1 Device by Does not apply route daily. Automatic blood pressure cuff with regular cuff. Blood pressure to be monitored regularly at home. ICD-10 code: O09.90. 05/17/19   Donnamae Jude, MD  ?calcium carbonate (TUMS - DOSED IN MG ELEMENTAL CALCIUM) 500 MG chewable tablet Chew 5 tablets (  1,000 mg of elemental calcium total) by mouth 2 (two) times daily. 09/16/21   Radene Gunning, MD  ?Cholecalciferol (VITAMIN D3) 50 MCG (2000 UT) capsule Take 1 capsule (2,000 Units total) by mouth daily. 08/13/21   Shamleffer, Melanie Crazier, MD  ?famotidine (PEPCID) 20 MG tablet TAKE 1 TABLET(20 MG) BY MOUTH DAILY 09/20/21    Truett Mainland, DO  ?glucose blood test strip Use as instructed 07/23/21   Clarnce Flock, MD  ?haloperidol (HALDOL) 1 MG tablet Take 2 tablets (2 mg total) by mouth at bedtime. 09/16/21   Radene Gunning, MD  ?levothyroxine (SYNTHROID) 50 MCG tablet Take 1 tablet (50 mcg total) by mouth daily at 6 (six) AM. 09/17/21   Aletha Halim, MD  ?polyethylene glycol (MIRALAX / GLYCOLAX) 17 g packet Take 17 g by mouth 2 (two) times daily. 08/05/21   Aletha Halim, MD  ?Prenatal 27-1 MG TABS Take 1 tablet by mouth daily. ?Patient not taking: Reported on 09/17/2021 06/21/21   Constant, Vickii Chafe, MD  ?  ? ?Family History  ?Problem Relation Age of Onset  ? Diabetes Brother   ? Asthma Paternal Grandmother   ? ? ?Social History  ? ?Socioeconomic History  ? Marital status: Married  ?  Spouse name: Not on file  ? Number of children: 2  ? Years of education: Not on file  ? Highest education level: GED or equivalent  ?Occupational History  ? Occupation: Unemployed  ?Tobacco Use  ? Smoking status: Some Days  ?  Packs/day: 0.25  ?  Types: Cigarettes  ? Smokeless tobacco: Never  ?Vaping Use  ? Vaping Use: Never used  ?Substance and Sexual Activity  ? 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 Drug use: Yes  ?  Types: Marijuana  ?  Comment: Occasionally , last use 02 Sep 2021  ? Sexual activity: Yes  ?  Birth control/protection: None  ?Other Topics Concern  ? Not on file  ?Social History Narrative  ? Pt stated that she lives in [place] with her fiance and two children, that she recently moved from [place], and also that she is pregnant.  She also stated that she is unemployed.  ? ?Social Determinants of Health  ? ?Financial Resource Strain: Not on file  ?Food Insecurity: Not on file  ?Transportation Needs: Not on file  ?Physical Activity: Not on file  ?Stress: Not on file  ?Social Connections: Not on file  ? ? ? ?Review of Systems: A 12 point ROS discussed and pertinent positives are indicated in the HPI above.  All other  systems are negative. ? ?Vital Signs: ?BP 109/81   Pulse 75   Resp 20   Ht 5' (1.524 m)   Wt 132 lb 3.2 oz (60 kg)   LMP  (Approximate)   SpO2 97%   BMI 25.82 kg/m?  ? ?Physical Exam ?Vitals and nursing note reviewed.  ?Constitutional:   ?   General: Patient is not in acute distress. ?   Appearance: Normal appearance. Patient is not ill-appearing.  ?HENT:  ?   Head: Normocephalic and atraumatic.  ?   Mouth/Throat:  ?   Mouth: Mucous membranes are moist.  ?   Pharynx: Oropharynx is clear.  ?Cardiovascular:  ?   Rate and Rhythm: Normal rate and regular rhythm.  ?   Pulses: Normal pulses.  ?   Heart sounds: Normal heart sounds.  ?Pulmonary:  ?   Effort: Pulmonary effort is normal.  ?  Breath sounds: Normal breath sounds.  ?Abdominal:  ?   General: Abdomen is flat. Bowel sounds are normal.  ?   Palpations: Abdomen is soft.  ?Musculoskeletal:  ?   Cervical back: Neck supple.  ?Skin: ?   General: Skin is warm and dry.  ?   Coloration: Skin is not jaundiced or pale.  ?Neurological:  ?   Mental Status: Patient is alert and oriented to person, place, and time.  ?Psychiatric:     ?   Mood and Affect: Mood normal.     ?   Behavior: Behavior normal.     ?   Judgment: Judgment normal.  ? ? ?MD Evaluation ?Airway: WNL ?Heart: WNL ?Abdomen: WNL ?Chest/ Lungs: WNL ?ASA  Classification: 3 ?Mallampati/Airway Score: Two ? ?Imaging: ?US RENAL ? ?Result Date: 09/16/2021 ?CLINICAL DATA:  Acute kidney injury.  Twenty-six weeks pregnant. EXAM: RENAL / URINARY TRACT ULTRASOUND COMPLETE COMPARISON:  Renal ultrasound dated May 26, 2021. FINDINGS: Right Kidney: Renal measurements: 13.2 x 6.7 x 5.7 cm = volume: 261 mL. Similar severe hydronephrosis and cortical thinning. Suspect 1.5 cm calculus in the distal right ureter. No mass visualized. Left Kidney: Renal measurements: 12.0 x 6.2 x 4.5 cm = volume: 168 mL. Echogenicity within normal limits. No mass or hydronephrosis visualized. Bladder: Appears normal for degree of bladder  distention. Other: None. IMPRESSION: 1. Similar severe right hydronephrosis and cortical thinning. Suspected 1.5 cm calculus in the distal right ureter. Electronically Signed   By: Titus Dubin M.D.   On: 09/16/2021 10:4

## 2021-09-27 ENCOUNTER — Other Ambulatory Visit: Payer: Self-pay

## 2021-09-27 ENCOUNTER — Other Ambulatory Visit (HOSPITAL_COMMUNITY): Payer: Self-pay | Admitting: Urology

## 2021-09-27 ENCOUNTER — Ambulatory Visit (HOSPITAL_COMMUNITY)
Admission: RE | Admit: 2021-09-27 | Discharge: 2021-09-27 | Disposition: A | Discharge status: Home / Self Care | Payer: Commercial Managed Care - HMO | Source: Ambulatory Visit | Attending: Urology | Admitting: Urology

## 2021-09-27 ENCOUNTER — Telehealth: Payer: Self-pay

## 2021-09-27 ENCOUNTER — Encounter (HOSPITAL_COMMUNITY): Payer: Self-pay

## 2021-09-27 DIAGNOSIS — O99891 Other specified diseases and conditions complicating pregnancy: Secondary | ICD-10-CM | POA: Diagnosis not present

## 2021-09-27 DIAGNOSIS — N1339 Other hydronephrosis: Secondary | ICD-10-CM

## 2021-09-27 DIAGNOSIS — Z3A27 27 weeks gestation of pregnancy: Secondary | ICD-10-CM | POA: Diagnosis not present

## 2021-09-27 DIAGNOSIS — N132 Hydronephrosis with renal and ureteral calculous obstruction: Secondary | ICD-10-CM | POA: Insufficient documentation

## 2021-09-27 HISTORY — PX: IR NEPHROSTOMY PLACEMENT RIGHT: IMG6064

## 2021-09-27 LAB — CBC WITH DIFFERENTIAL/PLATELET
Abs Immature Granulocytes: 0.04 10*3/uL (ref 0.00–0.07)
Basophils Absolute: 0 10*3/uL (ref 0.0–0.1)
Basophils Relative: 0 %
Eosinophils Absolute: 0.2 10*3/uL (ref 0.0–0.5)
Eosinophils Relative: 2 %
HCT: 23.3 % — ABNORMAL LOW (ref 36.0–46.0)
Hemoglobin: 7.5 g/dL — ABNORMAL LOW (ref 12.0–15.0)
Immature Granulocytes: 0 %
Lymphocytes Relative: 21 %
Lymphs Abs: 2.2 10*3/uL (ref 0.7–4.0)
MCH: 31.8 pg (ref 26.0–34.0)
MCHC: 32.2 g/dL (ref 30.0–36.0)
MCV: 98.7 fL (ref 80.0–100.0)
Monocytes Absolute: 0.8 10*3/uL (ref 0.1–1.0)
Monocytes Relative: 8 %
Neutro Abs: 6.9 10*3/uL (ref 1.7–7.7)
Neutrophils Relative %: 69 %
Platelets: 266 10*3/uL (ref 150–400)
RBC: 2.36 MIL/uL — ABNORMAL LOW (ref 3.87–5.11)
RDW: 13.3 % (ref 11.5–15.5)
WBC: 10.1 10*3/uL (ref 4.0–10.5)
nRBC: 0 % (ref 0.0–0.2)

## 2021-09-27 LAB — BASIC METABOLIC PANEL
Anion gap: 4 — ABNORMAL LOW (ref 5–15)
BUN: 10 mg/dL (ref 6–20)
CO2: 19 mmol/L — ABNORMAL LOW (ref 22–32)
Calcium: 7.7 mg/dL — ABNORMAL LOW (ref 8.9–10.3)
Chloride: 113 mmol/L — ABNORMAL HIGH (ref 98–111)
Creatinine, Ser: 0.94 mg/dL (ref 0.44–1.00)
GFR, Estimated: >60 mL/min (ref >60)
Glucose, Bld: 79 mg/dL (ref 70–99)
Potassium: 4 mmol/L (ref 3.5–5.1)
Sodium: 136 mmol/L (ref 135–145)

## 2021-09-27 LAB — PROTIME-INR
INR: 1 (ref 0.8–1.2)
Prothrombin Time: 13.6 seconds (ref 11.4–15.2)

## 2021-09-27 MED ORDER — LIDOCAINE HCL 1 % IJ SOLN
INTRAMUSCULAR | Status: AC
  Filled 2021-09-27: qty 20

## 2021-09-27 MED ORDER — SODIUM CHLORIDE 0.9% FLUSH: 5.0000 mL | Freq: Three times a day (TID) | INTRAVENOUS | Status: DC

## 2021-09-27 MED ORDER — LACTATED RINGERS IV SOLN: INTRAVENOUS | Status: DC

## 2021-09-27 MED ORDER — LIDOCAINE HCL (PF) 1 % IJ SOLN
INTRAMUSCULAR | Status: AC | PRN
  Administered 2021-09-27: 15 mL

## 2021-09-27 MED ORDER — FENTANYL CITRATE (PF) 100 MCG/2ML IJ SOLN
INTRAMUSCULAR | Status: AC
  Filled 2021-09-27: qty 2

## 2021-09-27 MED ORDER — FENTANYL CITRATE (PF) 100 MCG/2ML IJ SOLN
INTRAMUSCULAR | Status: AC | PRN
  Administered 2021-09-27 (×2): 25 ug via INTRAVENOUS

## 2021-09-27 MED ORDER — IOHEXOL 300 MG/ML  SOLN
50.0000 mL | Freq: Once | INTRAMUSCULAR | Status: AC | PRN
  Administered 2021-09-27: 10 mL

## 2021-09-27 MED ORDER — SODIUM CHLORIDE 0.9 % IV SOLN
2.0000 g | Freq: Once | INTRAVENOUS | Status: AC
  Administered 2021-09-27: 2 g via INTRAVENOUS
  Filled 2021-09-27: qty 20

## 2021-09-27 NOTE — Telephone Encounter (Addendum)
-----   Message from Griffin Basil, MD sent at 09/26/2021  5:35 PM EDT ----- ?Pt remains anemic, will again try iron infusion, iron infusion orders have been placed ? ? ?Called [facility] [place] to request first available appt. Left VM on patient's phone (who is in hospital for procedure today) stating first available iron infusion appt will be added to her MyChart schedule.  ?

## 2021-09-27 NOTE — Discharge Instructions (Addendum)
Please call Interventional Radiology clinic [phone] with any questions or concerns. ? ? ?Percutaneous Nephrostomy, Care After ?This sheet gives you information about how to care for yourself after your procedure. Your health care provider may also give you more specific instructions. If you have problems or questions, contact your health careprovider. ?What can I expect after the procedure? ?After the procedure, it is common to have: ?Some soreness where the nephrostomy tube was inserted (tube insertion site). ?Blood-tinged drainage from the nephrostomy tube for the first 24 hours. ?Follow these instructions at home: ?Activity ? ?Do not lift anything that is heavier than 10 lb (4.5 kg), or the limit that you are told, until your health care provider says that it is safe. ?Return to your normal activities as told by your health care provider. Ask your health care provider what activities are safe for you. ?Avoid activities that may cause the nephrostomy tubing to bend. ?Do not take baths, swim, or use a hot tub until your health care provider approves. Ask your health care provider if you can take showers. Cover the nephrostomy tube bandage (dressing) with a watertight covering when you take a shower. ?If you were given a sedative during the procedure, it can affect you for several hours. Do not drive or operate machinery until your health care provider says that it is safe. ?  ?Care of the tube insertion site ? ?Follow instructions from your health care provider about how to take care of your tube insertion site. Make sure you: ?Wash your hands with soap and water for at least 20 seconds before you change your dressing. If soap and water are not available, use hand sanitizer. ?Change your dressing as told by your health care provider. Be careful not to pull on the tube while removing the dressing. ?When you change the dressing, wash the skin around the tube, rinse well, and pat the skin dry. ?Check the tube  insertion area every day for signs of infection. Check for: ?Redness, swelling, or pain. ?Fluid or blood. ?Warmth. ?Pus or a bad smell. ?  ?Care of the nephrostomy tube and drainage bag ?Always keep the tubing, the leg bag, or the bedside drainage bags below the level of the kidney so that your urine drains freely. ?When connecting your nephrostomy tube to a drainage bag, make sure that there are no kinks in the tubing and that your urine is draining freely. You may want to use an elastic bandage to wrap any exposed tubing that goes from the nephrostomy tube to any of the connecting tubes. ?At night, you may want to connect your nephrostomy tube or the leg bag to a larger bedside drainage bag. ?Follow instructions from your health care provider about how to empty or change the drainage bag. ?Empty the drainage bag when it becomes ? full. ?Replace the drainage bag and any extension tubing that is connected to your nephrostomy tube every 7 days or as told by your health care provider. Your health care provider will explain how to change the drainage bag and extension tubing. ?General instructions ?Take over-the-counter and prescription medicines only as told by your health care provider. ?Keep all follow-up visits as told by your health care provider. This is important. ?The nephrostomy tube will need to be changed every 8-12 weeks. ?Contact a health care provider if: ?You have problems with any of the valves or tubing. ?You have persistent pain or soreness in your back. ?You have redness, swelling, or pain around your tube insertion  site. ?You have fluid or blood coming from your tube insertion site. ?Your tube insertion site feels warm to the touch. ?You have pus or a bad smell coming from your tube insertion site. ?You have increased urine output or you feel burning when urinating. ?Get help right away if: ?You have pain in your abdomen during the first week. ?You have chest pain or have trouble breathing. ?You  have a new appearance of blood in your urine. ?You have a fever or chills. ?You have back pain that is not relieved by your medicine. ?You have decreased urine output. ?Your nephrostomy tube comes out. ?Summary ?After the procedure, it is common to have some soreness where the nephrostomy tube was inserted (tube insertion site). ?Follow instructions from your health care provider about how to take care of your tube insertion site, nephrostomy tube, and drainage bag. ?Keep all follow-up visits for care and for changing the tube. ?This information is not intended to replace advice given to you by your health care provider. Make sure you discuss any questions you have with your healthcare provider. ?Document Revised: 07/19/2019 Document Reviewed: 07/19/2019 ?Elsevier Patient Education ? [place].  ?

## 2021-09-27 NOTE — Procedures (Signed)
Interventional Radiology Procedure Note ? ?Procedure: Right percutaneous nephrostomy tube placement ? ?Complications: None ? ?Estimated Blood Loss: < 10 mL ? ?Findings: ?10 Fr PCN placed in obstructed right kidney. Urine turbid and sample sent for culture. PCN to gravity bag. Change in 4 weeks due to pregnancy. ? ?Eulas Post T. Kathlene Cote, M.D ?Pager:  [phone] ? ?  ?

## 2021-09-27 NOTE — Progress Notes (Signed)
RROB nurse performs fhr monitoring before nephrostomy tube is placed on pt who is G12P2 at 27.5 weeks.  FHR tracing is reassuring based for gestation.  RN will return for post op monitoring once procedure is complete. ?

## 2021-09-27 NOTE — Progress Notes (Signed)
RROB nurse performs fhr monitoring post op on pt who is G12P2 at 27.5wks.  Dr Roselie Awkward made aware that fhr tracing is reassuring for gestation.  MD also notified that pt did have 1 uc during monitoring in which she felt her belly tighten but no pain.  FHR tracing had small variable during UC.  Dr Roselie Awkward says he is alright with this and pt may be OB cleared.  Pt instructed to contact her physician for decreased fetal movement, LOF or VB.  Pt reports understanding. ?

## 2021-09-28 LAB — URINE CULTURE
Culture: NO GROWTH
Special Requests: NORMAL

## 2021-09-30 ENCOUNTER — Telehealth: Payer: Self-pay | Admitting: General Practice

## 2021-09-30 NOTE — Telephone Encounter (Signed)
Patient called and left message on nurse voicemail line stating she wants to know if her appt today is virtual or in person. Patient is aware she has an appt tomorrow as well.  ? ?Called patient, no answer- left message to return our phone call, will send mychart message. ?

## 2021-10-01 ENCOUNTER — Encounter: Payer: Managed Care, Other (non HMO) | Attending: Obstetrics and Gynecology | Admitting: Registered"

## 2021-10-01 ENCOUNTER — Ambulatory Visit (INDEPENDENT_AMBULATORY_CARE_PROVIDER_SITE_OTHER): Payer: Medicaid Other | Admitting: Registered"

## 2021-10-01 ENCOUNTER — Other Ambulatory Visit: Payer: Self-pay

## 2021-10-01 DIAGNOSIS — O24419 Gestational diabetes mellitus in pregnancy, unspecified control: Secondary | ICD-10-CM | POA: Insufficient documentation

## 2021-10-01 DIAGNOSIS — Z3A00 Weeks of gestation of pregnancy not specified: Secondary | ICD-10-CM | POA: Diagnosis not present

## 2021-10-01 NOTE — Progress Notes (Signed)
Gestational Diabetes follow-up ? ?Appt start time: 1115 end time:  1200. ? ?Although follow-up request was for hypoglycemia in GDM, for the past week lowest reading in patient's glucomer was 67 mg/dL. Used appointment time to teach proper technique and blood sugar record keeping and definition of low blood sugar. ? ?Pt states symptoms that could be low blood sugar, but more likely that she is not getting adequate intake. Pt states she has plenty of food at home. Pt states reason not eating due to nausea. Pt states she doesn't take medication, just tries to keep herself from vomiting by not eating. ? ?Glucometer check: ?FBS are elevated 95-120; no reliable PPBG readings ? ?Pt has been checking BG during the day at random times, not related to timing of meals. Pt misunderstood when to check blood sugar. Pt takes strips out of container and has them loose in the bag. Instructed Pt to keep strips in bottle until ready to use. ? ?Pt states having difficulty getting blood from finger prick. Pt tested her BG during visit using highest lacing device setting with not enough blood sample. Pt is not holding lancing device against skin tight enough. RD used same setting and was able to get plenty of blood. ? ?Taught patient how to use glucose log book. Patient showed understanding with teach back. Needed to review a couple of times. ? ?MEDICATIONS: synthroid, pepcid, haldol, tums, vit d3, miralax, aspirin, prenatal vitamin ?  ?DIETARY INTAKE: limited   ?24-hr recall:  ?B ( AM): sausage, egg, potato breakfast bowl  ?Snk ( AM):  ?L ( PM):  ?Snk ( PM):  ?D ( PM): salad ?Snk ( PM):  ?Beverages: water, ~10 oz soda ? ?Handouts given during visit include: ?Small log book that fits in glucometer bag ? ?Barriers to learning/adherence to lifestyle change: mental disability ? ?Demonstrated degree of understanding via:  Teach Back  ? ?Monitoring/Evaluation:  Dietary intake, exercise, blood sugar log, in 2 weeks.  ? ?

## 2021-10-02 ENCOUNTER — Ambulatory Visit: Payer: Managed Care, Other (non HMO) | Attending: Maternal & Fetal Medicine

## 2021-10-02 ENCOUNTER — Ambulatory Visit: Payer: Managed Care, Other (non HMO) | Admitting: *Deleted

## 2021-10-02 VITALS — BP 106/72 | HR 97

## 2021-10-02 DIAGNOSIS — O99283 Endocrine, nutritional and metabolic diseases complicating pregnancy, third trimester: Secondary | ICD-10-CM | POA: Diagnosis not present

## 2021-10-02 DIAGNOSIS — O09523 Supervision of elderly multigravida, third trimester: Secondary | ICD-10-CM | POA: Diagnosis not present

## 2021-10-02 DIAGNOSIS — Z9079 Acquired absence of other genital organ(s): Secondary | ICD-10-CM | POA: Insufficient documentation

## 2021-10-02 DIAGNOSIS — O34219 Maternal care for unspecified type scar from previous cesarean delivery: Secondary | ICD-10-CM | POA: Insufficient documentation

## 2021-10-02 DIAGNOSIS — O0990 Supervision of high risk pregnancy, unspecified, unspecified trimester: Secondary | ICD-10-CM | POA: Insufficient documentation

## 2021-10-02 DIAGNOSIS — O09522 Supervision of elderly multigravida, second trimester: Secondary | ICD-10-CM | POA: Diagnosis present

## 2021-10-02 DIAGNOSIS — O10912 Unspecified pre-existing hypertension complicating pregnancy, second trimester: Secondary | ICD-10-CM | POA: Insufficient documentation

## 2021-10-02 DIAGNOSIS — O24415 Gestational diabetes mellitus in pregnancy, controlled by oral hypoglycemic drugs: Secondary | ICD-10-CM | POA: Diagnosis not present

## 2021-10-02 DIAGNOSIS — F319 Bipolar disorder, unspecified: Secondary | ICD-10-CM | POA: Diagnosis present

## 2021-10-02 DIAGNOSIS — O10013 Pre-existing essential hypertension complicating pregnancy, third trimester: Secondary | ICD-10-CM

## 2021-10-02 DIAGNOSIS — O24419 Gestational diabetes mellitus in pregnancy, unspecified control: Secondary | ICD-10-CM | POA: Insufficient documentation

## 2021-10-02 DIAGNOSIS — O2620 Pregnancy care for patient with recurrent pregnancy loss, unspecified trimester: Secondary | ICD-10-CM

## 2021-10-02 DIAGNOSIS — Z9049 Acquired absence of other specified parts of digestive tract: Secondary | ICD-10-CM | POA: Diagnosis present

## 2021-10-02 DIAGNOSIS — Z3A28 28 weeks gestation of pregnancy: Secondary | ICD-10-CM | POA: Insufficient documentation

## 2021-10-02 DIAGNOSIS — E039 Hypothyroidism, unspecified: Secondary | ICD-10-CM

## 2021-10-03 NOTE — Telephone Encounter (Signed)
Called pt on 10/01/21 to review new appt. ?

## 2021-10-07 ENCOUNTER — Encounter: Payer: Self-pay | Admitting: Obstetrics and Gynecology

## 2021-10-07 ENCOUNTER — Ambulatory Visit (INDEPENDENT_AMBULATORY_CARE_PROVIDER_SITE_OTHER): Payer: Managed Care, Other (non HMO) | Admitting: Internal Medicine

## 2021-10-07 VITALS — BP 112/68 | HR 72 | Ht 60.0 in | Wt 133.0 lb

## 2021-10-07 DIAGNOSIS — E210 Primary hyperparathyroidism: Secondary | ICD-10-CM | POA: Diagnosis not present

## 2021-10-07 DIAGNOSIS — E039 Hypothyroidism, unspecified: Secondary | ICD-10-CM

## 2021-10-07 DIAGNOSIS — E559 Vitamin D deficiency, unspecified: Secondary | ICD-10-CM

## 2021-10-07 LAB — VITAMIN D 25 HYDROXY (VIT D DEFICIENCY, FRACTURES): VITD: 33.75 ng/mL (ref 30.00–100.00)

## 2021-10-07 LAB — ALBUMIN: Albumin: 3.1 g/dL — ABNORMAL LOW (ref 3.5–5.2)

## 2021-10-07 LAB — TSH: TSH: 0.04 u[IU]/mL — ABNORMAL LOW (ref 0.35–5.50)

## 2021-10-07 NOTE — Progress Notes (Signed)
? ? ? ?Name: Theresa Mccoy  ?MRN/ DOB: [id number], [birth year]    ?Age/ Sex: 41 y.o., female   ? ?PCP: Theresa Perna, NP   ?Reason for Endocrinology Evaluation: Hypercalcemia   ?   ?Date of Initial Endocrinology Evaluation: 10/07/2021   ? ? ?HPI: ?Theresa Mccoy is a 41 y.o. female with a past medical history of hyperparathyroidism and bipolar disorder. The patient presented for initial endocrinology clinic visit on 10/07/2021 for consultative assistance with her Hypercalcemia.  ? ?[name] indicates that she was first diagnosed with hypercalcemia in 2017 with a max level of 13.2 mg/dL ( Uncorrected ), and a PTH 137 pg/mL in 05/2021.  ? ?She has history of kidney stones ?She denies osteoporosis or prior fractures.  ?She denies family history of osteoporosis, parathyroid disease, thyroid disease.  ? ? ?She met surgical criteria with a serum calcium concentration of 1 mg/DL or more above the upper limit of normal. ? ? ?She is status post right inferior parathyroidectomy 09/13/2021 June 2 trimester ? ? ? ?THYROID HISTORY: ?Patient with hypothyroidism, has been on LT-4 replacement  ? ? ?SUBJECTIVE:  ? ? ?Today (10/07/21):  Theresa Mccoy is here for follow-up on primary hyperparathyroidism, and hypothyroid  ? ? ?She is currently 29.1 weeks of gestation  ?EDD 12/22/2021 ? ?Per patient this is her 12th pregnancy, she currently has a 41 year old and a 41 year old living children. ?She is status post right inferior parathyroidectomy on 09/13/2021 ?She presented to the ED with hypoglycemia following insulin initiation for gestational diabetes. ? ?She had a ureteric colic , currently with a nephrostomy tube ? ?Energy level has improved  ?Appetite has improved  ? ?She has been over eating  and checks her glucose postprandial  ?Denies peri-oral numbness  ?Has b/l hand tingling  ?Denies muscle spasms  ?Denies constipation  ? ? ?She is on Tums 1 BID  ?Vitamin D 2000 iu  ? ?Levothyroxine 50 mcg daily ? ? ?HISTORY:  ?Past Medical  History:  ?Past Medical History:  ?Diagnosis Date  ? AKI (acute kidney injury) ([place]) 01/22/2020  ? Anemia   ? Depression   ? Diabetes mellitus without complication ([place])   ? Hypernatremia 01/22/2020  ? Hyperthyroidism   ? PTSD (post-traumatic stress disorder)   ? SBO (small bowel obstruction) ([place]) 07/07/2019  ? Sepsis ([place]) 01/22/2020  ? ?Past Surgical History:  ?Past Surgical History:  ?Procedure Laterality Date  ? BOWEL RESECTION N/A 07/08/2019  ? Procedure: Small Bowel Resection;  Surgeon: Clovis Riley, MD;  Location: [facility];  Service: General;  Laterality: N/A;  ? CESAREAN SECTION    ? DILATION AND CURETTAGE OF UTERUS    ? HAND SURGERY Left   ? IR NEPHROSTOMY PLACEMENT RIGHT  09/27/2021  ? LAPAROTOMY N/A 07/08/2019  ? Procedure: EXPLORATORY LAPAROTOMY;  Surgeon: Clovis Riley, MD;  Location: [facility];  Service: General;  Laterality: N/A;  ? OOPHORECTOMY Right   ? PARATHYROIDECTOMY Right 09/13/2021  ? Procedure: RIGHT INFERIOR PARATHYROIDECTOMY;  Surgeon: Armandina Gemma, MD;  Location: [facility];  Service: General;  Laterality: Right;  ? SALPINGECTOMY    ?  ?Social History:  reports that she has been smoking cigarettes. She has been smoking an average of .25 packs per day. She has never used smokeless tobacco. She reports current drug use. Drug: Marijuana. She reports that she does not drink alcohol. ?Family History: family history includes Asthma in her paternal grandmother; Diabetes in her brother. ? ? ?HOME MEDICATIONS: ?Allergies as of 10/07/2021   ? ?  Reactions  ? Codeine Other (See Comments)  ? Constipation  ? ?  ? ?  ?Medication List  ?  ? ?  ? Accurate as of October 07, 2021  8:50 AM. If you have any questions, ask your nurse or doctor.  ?  ?  ? ?  ? ?Accu-Chek Softclix Lancets lancets ?Use four times daily as instructed. ?  ?acetaminophen 325 MG tablet ?Commonly known as: TYLENOL ?Take 2 tablets (650 mg total) by mouth every 4 (four) hours as needed (for pain scale < 4  OR  temperature  >/=  100.5 F). ?   ?aspirin EC 81 MG tablet ?Take 1 tablet (81 mg total) by mouth daily. Swallow whole. ?  ?Blood Pressure Monitor Automat Devi ?1 Device by Does not apply route daily. Automatic blood pressure cuff with regular cuff. Blood pressure to be monitored regularly at home. ICD-10 code: O09.90. ?  ?calcium carbonate 500 MG chewable tablet ?Commonly known as: TUMS - dosed in mg elemental calcium ?Chew 5 tablets (1,000 mg of elemental calcium total) by mouth 2 (two) times daily. ?  ?famotidine 20 MG tablet ?Commonly known as: PEPCID ?TAKE 1 TABLET(20 MG) BY MOUTH DAILY ?  ?glucose blood test strip ?Use as instructed ?  ?haloperidol 1 MG tablet ?Commonly known as: HALDOL ?Take 2 tablets (2 mg total) by mouth at bedtime. ?  ?levothyroxine 50 MCG tablet ?Commonly known as: SYNTHROID ?Take 1 tablet (50 mcg total) by mouth daily at 6 (six) AM. ?  ?polyethylene glycol 17 g packet ?Commonly known as: MIRALAX / GLYCOLAX ?Take 17 g by mouth 2 (two) times daily. ?  ?Prenatal 27-1 MG Tabs ?Take 1 tablet by mouth daily. ?  ?Vitamin D3 50 MCG (2000 UT) capsule ?Take 1 capsule (2,000 Units total) by mouth daily. ?  ? ?  ?  ? ? ?REVIEW OF SYSTEMS: ?A comprehensive ROS was conducted with the patient and is negative except as per HPI  ? ? ? ?OBJECTIVE:  ?VS: BP 112/68 (BP Location: Left Arm, Patient Position: Sitting, Cuff Size: Small)   Pulse 72   Ht 5' (1.524 m)   Wt 133 lb (60.3 kg)   LMP  (Approximate)   SpO2 98%   BMI 25.97 kg/m?  ? ? ?Wt Readings from Last 3 Encounters:  ?09/27/21 132 lb 3.2 oz (60 kg)  ?09/25/21 132 lb 3.2 oz (60 kg)  ?09/17/21 138 lb 12.8 oz (63 kg)  ? ? ? ?EXAM: ?General: Pt appears well and is in NAD  ?Neck: General: Supple without adenopathy. ?Thyroid: Thyroid size normal.  No goiter or nodules appreciated.  ?Lungs: Clear with good BS bilat with no rales, rhonchi, or wheezes  ?Heart: Auscultation: RRR.  ?Abdomen: Normoactive bowel sounds, soft, nontender, without masses or organomegaly palpable  ?Extremities:   ?BL LE: No pretibial edema normal ROM and strength.  ?Mental Status: Judgment, insight: Intact ?Orientation: Oriented to time, place, and person ?Mood and affect: No depression, anxiety, or agitation  ? ? ? ?DATA REVIEWED: ? ?  ? Latest Reference Range & Units 10/07/21 11:58  ?Calcium 8.6 - 10.2 mg/dL 7.4 (L)  ?Albumin 3.5 - 5.2 g/dL 3.1 (L)  ? ? Latest Reference Range & Units 10/07/21 11:58  ?VITD 30.00 - 100.00 ng/mL 33.75  ? ? Latest Reference Range & Units 10/07/21 11:58  ?PTH, Intact 16 - 77 pg/mL 93 (H)  ?TSH 0.35 - 5.50 uIU/mL 0.04 (L)  ? ? ?ASSESSMENT/PLAN/RECOMMENDATIONS:  ? ?Primary Hyperparathyroidism : ? ? ? ? ?-  S/P Right Parathyroidectomy 09/13/2021, patient with hypocalcemia, her corrected calcium today is 8.1 to MG/DL (albumin 3.1) ? ?-We will increase Tums as below ?-PTH elevated I suspect this is secondary to hypocalcemia at this time ?-We will increase calcium supplement as below ? ? ? ?Medication ?Increase Tums to TID  ? ?2. Hypothyroidism : ? ?-Her TSH continues to be suppressed , she is supposed to be on levothyroxine 50 mcg daily, I have personally contacted [facility] today and they have confirmed that she last picked up levothyroxine on 09/18/2021 of levothyroxine 75 mcg ?-Pharmacist indicates there is no prescription of levothyroxine 50 mcg ? ?-I have asked the pharmacist to cancel levothyroxine 75 and prescription of levothyroxine 50 mcg was sent ? ? ?Medication  ?Stop levothyroxine 75 mcg daily ?Start levothyroxine 50 mcg daily ? ? ? ? ?3. Vitamin D Deficiency : ? ?-Vitamin D normalized ? ? ?Medication ? ?Continue vitamin D 2000 IU daily ? ? ? ?Follow-up in 2 months ? ? ?Signed electronically by: ?Abby Nena Jordan, MD ? ?[facility]  ?[facility] ?[address] ?[place], [place] [address] ?Phone: [phone] ?FAX: [phone] ? ? ?CC: ?Theresa Perna, NP ?[address] ?[place] [address] ?Phone: [phone] ?Fax:  [phone] ? ? ?Return to Endocrinology clinic as below: ?Future Appointments  ?Date Time Provider [place]  ?10/07/2021 11:30 AM Jerrik Housholder, Melanie Crazier, MD [facility] None  ?10/09/2021  2:35 PM Nehemiah Settle Tanna Savoy,

## 2021-10-08 ENCOUNTER — Telehealth: Payer: Self-pay | Admitting: Internal Medicine

## 2021-10-08 LAB — PTH, INTACT AND CALCIUM
Calcium: 7.4 mg/dL — ABNORMAL LOW (ref 8.6–10.2)
PTH: 93 pg/mL — ABNORMAL HIGH (ref 16–77)

## 2021-10-08 MED ORDER — LEVOTHYROXINE SODIUM 50 MCG PO TABS: 50.0000 ug | ORAL_TABLET | Freq: Every day | ORAL | 3 refills | Status: AC

## 2021-10-08 MED ORDER — CALCIUM CARBONATE ANTACID 500 MG PO CHEW: 2.0000 | CHEWABLE_TABLET | Freq: Two times a day (BID) | ORAL | 1 refills | Status: DC

## 2021-10-08 NOTE — Telephone Encounter (Signed)
Please let the patient know that her calcium remains low, she needs to increase Tums to 3 tablets daily ? ? ? ?Her thyroid test shows she is on too much levothyroxine.  She needs to stop levothyroxine 75, she needs to pick up new prescription of levothyroxine 50 mcg daily from now on ? ? ? ?Vitamin D is normal needs to continue vitamin D 2000 IU daily ? ? ? ? ?Thank you ?

## 2021-10-08 NOTE — Telephone Encounter (Signed)
VM left and mychart message sent  

## 2021-10-09 ENCOUNTER — Ambulatory Visit (INDEPENDENT_AMBULATORY_CARE_PROVIDER_SITE_OTHER): Payer: Managed Care, Other (non HMO) | Admitting: Family Medicine

## 2021-10-09 VITALS — BP 111/81 | HR 81 | Wt 132.7 lb

## 2021-10-09 DIAGNOSIS — N200 Calculus of kidney: Secondary | ICD-10-CM

## 2021-10-09 DIAGNOSIS — O10919 Unspecified pre-existing hypertension complicating pregnancy, unspecified trimester: Secondary | ICD-10-CM

## 2021-10-09 DIAGNOSIS — E210 Primary hyperparathyroidism: Secondary | ICD-10-CM

## 2021-10-09 DIAGNOSIS — E039 Hypothyroidism, unspecified: Secondary | ICD-10-CM

## 2021-10-09 DIAGNOSIS — O34219 Maternal care for unspecified type scar from previous cesarean delivery: Secondary | ICD-10-CM

## 2021-10-09 DIAGNOSIS — Z23 Encounter for immunization: Secondary | ICD-10-CM | POA: Diagnosis not present

## 2021-10-09 DIAGNOSIS — O0990 Supervision of high risk pregnancy, unspecified, unspecified trimester: Secondary | ICD-10-CM

## 2021-10-09 DIAGNOSIS — O24419 Gestational diabetes mellitus in pregnancy, unspecified control: Secondary | ICD-10-CM

## 2021-10-09 DIAGNOSIS — F314 Bipolar disorder, current episode depressed, severe, without psychotic features: Secondary | ICD-10-CM

## 2021-10-09 NOTE — Telephone Encounter (Signed)
Patient reviewed message in mychart  ?

## 2021-10-09 NOTE — Progress Notes (Signed)
? ?  PRENATAL VISIT NOTE ? ?Subjective:  ?Theresa Mccoy is a 41 y.o. [id number] at 3w3dbeing seen today for ongoing prenatal care.  She is currently monitored for the following issues for this high-risk pregnancy and has Bipolar disorder, current episode mixed, moderate ([place]; Generalized anxiety disorder; UTI in pregnancy; History of small bowel obstruction; Hypercalcemia; Primary hyperparathyroidism ([place]; AKI (acute kidney injury) ([place]; Renal calculus, right; Severe bipolar I disorder, current or most recent episode depressed ([place]; Supervision of high risk pregnancy, antepartum; Previous cesarean delivery x 2 affecting pregnancy, antepartum; S/P small bowel resection; H/O unilateral salpingectomy; Gestational diabetes mellitus (GDM) affecting pregnancy, antepartum; Pelvic adhesive disease; Chronic hypertension affecting pregnancy; Acquired hypothyroidism; Hypoglycemia associated with gestational diabetes ([place]; Anemia in pregnancy, second trimester; and Assistance needed with transportation on their problem list. ? ?Patient reports no complaints.  Contractions: Not present. Vag. Bleeding: None.  Movement: Present. Denies leaking of fluid.  ? ?The following portions of the patient's history were reviewed and updated as appropriate: allergies, current medications, past family history, past medical history, past social history, past surgical history and problem list.  ? ?Objective:  ? ?Vitals:  ? 10/09/21 1434  ?BP: 111/81  ?Pulse: 81  ?Weight: 132 lb 11.2 oz (60.2 kg)  ? ? ?Fetal Status: Fetal Heart Rate (bpm): 143   Movement: Present    ? ?General:  Alert, oriented and cooperative. Patient is in no acute distress.  ?Skin: Skin is warm and dry. No rash noted.   ?Cardiovascular: Normal heart rate noted  ?Respiratory: Normal respiratory effort, no problems with respiration noted  ?Abdomen: Soft, gravid, appropriate for gestational age.  Pain/Pressure: Present     ?Pelvic: Cervical exam deferred        ?Extremities:  Normal range of motion.  Edema: None  ?Mental Status: Normal mood and affect. Normal behavior. Normal judgment and thought content.  ? ?Assessment and Plan:  ?Pregnancy: [id number] 27w3d1. Supervision of high risk pregnancy, antepartum ?FHT and FH normal ? ?2. Previous cesarean delivery x 2 affecting pregnancy, antepartum ?Elective repeat - will schedule ? ?3. Chronic hypertension affecting pregnancy ?BP controlled ?Growth USKoreacheduled in 2 weeks. ? ?4. Gestational diabetes mellitus (GDM) affecting pregnancy, antepartum ?Reports CBGs controlled ?Fastings less than 90 ?PP mostly under 120 ? ?5. Primary hyperparathyroidism ([place]?S/p parathyroidectomy. ?On tums TID ? ?6. Acquired hypothyroidism ?On synthroid 5034m? ?7. Renal calculus, right ?Has urostomy tube. Urology referral around 34 weeks ? ?8. Severe bipolar I disorder, current or most recent episode depressed ([place] ? ?Preterm labor symptoms and general obstetric precautions including but not limited to vaginal bleeding, contractions, leaking of fluid and fetal movement were reviewed in detail with the patient. ?Please refer to After Visit Summary for other counseling recommendations.  ? ?No follow-ups on file. ? ?Future Appointments  ?Date Time Provider DepSchlusser4/05/2022  8:15 AM [facility]-EDUCATION [facility]  ?10/16/2021 10:00 AM [facility] None  ?10/25/2021  3:30 PM WL-IR 1 WL-IR [place]  ?10/29/2021 11:00 AM GolDory HornCSW [facility] None  ?10/30/2021  2:30 PM [facility] NURSE [facility]  ?10/30/2021  2:45 PM [facility] US5 [facility]  ?02/21/2022 10:10 AM Shamleffer, IbtMelanie CrazierD [facility] None  ? ? ?JacTruett [place] ?

## 2021-10-10 ENCOUNTER — Encounter: Payer: Self-pay | Admitting: Family Medicine

## 2021-10-10 LAB — CBC
Hematocrit: 25.1 % — ABNORMAL LOW (ref 34.0–46.6)
Hemoglobin: 8.6 g/dL — ABNORMAL LOW (ref 11.1–15.9)
MCH: 31.3 pg (ref 26.6–33.0)
MCHC: 34.3 g/dL (ref 31.5–35.7)
MCV: 91 fL (ref 79–97)
Platelets: 261 10*3/uL (ref 150–450)
RBC: 2.75 x10E6/uL — ABNORMAL LOW (ref 3.77–5.28)
RDW: 12.4 % (ref 11.7–15.4)
WBC: 11 10*3/uL — ABNORMAL HIGH (ref 3.4–10.8)

## 2021-10-10 LAB — HIV ANTIBODY (ROUTINE TESTING W REFLEX): HIV Screen 4th Generation wRfx: NONREACTIVE

## 2021-10-10 LAB — RPR: RPR Ser Ql: NONREACTIVE

## 2021-10-15 ENCOUNTER — Other Ambulatory Visit: Payer: Managed Care, Other (non HMO)

## 2021-10-16 ENCOUNTER — Non-Acute Institutional Stay (HOSPITAL_COMMUNITY)
Admission: RE | Admit: 2021-10-16 | Discharge: 2021-10-16 | Disposition: A | Discharge status: Home / Self Care | Payer: Managed Care, Other (non HMO) | Source: Ambulatory Visit | Attending: Internal Medicine | Admitting: Internal Medicine

## 2021-10-16 DIAGNOSIS — O99013 Anemia complicating pregnancy, third trimester: Secondary | ICD-10-CM | POA: Insufficient documentation

## 2021-10-16 DIAGNOSIS — Z3A30 30 weeks gestation of pregnancy: Secondary | ICD-10-CM | POA: Diagnosis not present

## 2021-10-16 MED ORDER — METHYLPREDNISOLONE SODIUM SUCC 125 MG IJ SOLR: 125.0000 mg | Freq: Once | INTRAMUSCULAR | Status: DC | PRN

## 2021-10-16 MED ORDER — SODIUM CHLORIDE 0.9 % IV SOLN
INTRAVENOUS | Status: DC | PRN
Start: 2021-10-16 — End: 2021-10-17

## 2021-10-16 MED ORDER — ALBUTEROL SULFATE (2.5 MG/3ML) 0.083% IN NEBU
2.5000 mg | INHALATION_SOLUTION | Freq: Once | RESPIRATORY_TRACT | Status: DC | PRN
Start: 2021-10-16 — End: 2021-10-17

## 2021-10-16 MED ORDER — IRON SUCROSE 20 MG/ML IV SOLN
500.0000 mg | Freq: Once | INTRAVENOUS | Status: AC
  Administered 2021-10-16: 500 mg via INTRAVENOUS
  Filled 2021-10-16: qty 25

## 2021-10-16 MED ORDER — DIPHENHYDRAMINE HCL 50 MG/ML IJ SOLN: 25.0000 mg | Freq: Once | INTRAMUSCULAR | Status: DC | PRN

## 2021-10-16 MED ORDER — EPINEPHRINE PF 1 MG/ML IJ SOLN
0.3000 mg | Freq: Once | INTRAMUSCULAR | Status: DC | PRN
  Filled 2021-10-16: qty 1

## 2021-10-16 MED ORDER — SODIUM CHLORIDE 0.9 % IV BOLUS
500.0000 mL | Freq: Once | INTRAVENOUS | Status: DC | PRN
Start: 2021-10-16 — End: 2021-10-17

## 2021-10-16 NOTE — Progress Notes (Addendum)
PATIENT CARE CENTER NOTE: ? ?Diagnosis: anemia complicating pregnancy ? ?Provider: Lynnda Shields MD ? ?Procedure: Venofer '500mg'$  ? ? ?Patient received Venofer ( dose 1 of 1) via PIV. No premedications required per orders. Pt complained of arms feeling like they were going number or falling asleep towards end of infusion. Apolonio Schneiders RN , staff to Dr. Elgie Congo notified. Infusion not interrupted, as pt voiced no other complaints and no s/s of a reaction to the medication observed. VS taken, BP 101/64, RN notified and explained pt BP was low prior to and during infusion. Encouraged patient to move arms around and provided with warm blankets. Pt states that issue is resolved and no longer feels this sensation to her arms. No PRN medications or  other  intervention required and pt received the remainder of the infusion without difficulty.  Pt agreed to stay for 15 minutes after infusion for observation, vitals stable, discharge instructions given , verbalized understanding. Patient alert, oriented, and ambulatory at the time of discharge.   ?

## 2021-10-18 ENCOUNTER — Other Ambulatory Visit: Payer: Self-pay | Admitting: Family Medicine

## 2021-10-22 ENCOUNTER — Other Ambulatory Visit (HOSPITAL_COMMUNITY): Payer: Self-pay

## 2021-10-24 ENCOUNTER — Encounter: Payer: Commercial Managed Care - HMO | Attending: Obstetrics and Gynecology | Admitting: Registered"

## 2021-10-24 ENCOUNTER — Ambulatory Visit (INDEPENDENT_AMBULATORY_CARE_PROVIDER_SITE_OTHER): Payer: Medicaid Other | Admitting: Registered"

## 2021-10-24 DIAGNOSIS — E11649 Type 2 diabetes mellitus with hypoglycemia without coma: Secondary | ICD-10-CM | POA: Insufficient documentation

## 2021-10-24 DIAGNOSIS — Z713 Dietary counseling and surveillance: Secondary | ICD-10-CM | POA: Insufficient documentation

## 2021-10-24 DIAGNOSIS — O24419 Gestational diabetes mellitus in pregnancy, unspecified control: Secondary | ICD-10-CM

## 2021-10-24 NOTE — Progress Notes (Signed)
Gestational Diabetes follow-up ? ?Appt start time: 10:12 end time:  11:47 ? ?Log book / FBS: mostly WNL; PPBG 80s-170s ?Pt has started writing down some meals after seeing high blood sugar readings which was very helpful to use examples for appropriate amount of carbohydrates and balancing with proteins. ? ?MEDICATIONS: synthroid, pepcid, haldol, tums, vit d3, miralax, aspirin, prenatal vitamin ?  ?DIETARY INTAKE:  ?Sample meals that raised blood sugar: ?Salad and cereal (had a lot of cereal because she was hungry) ?Hot pocket and crackers ? ?Patient did not include beverages in her log book. Pt reports drinking mostly water. Pt states husband makes kool aid and she drinks that but dilutes it with water so it doesn't taste that sweet. ? ?Education Topics: ?Practiced creating balanced meals and tweaking meals from the last 2 weeks that resulted in high blood sugar ?Sugar sweetened beverages / no-sugar added alternatives ?Foods that do not taste sweet can still raise blood sugar if they are a carbohydrate ?If willing to make changes could avoid medication ? ?Handouts given during visit include: ?Planning Healthy Meals tri-fold ? ?Barriers to learning/adherence to lifestyle change: mental disability ? ?Demonstrated degree of understanding via:  Teach Back  ? ?Monitoring/Evaluation:  Dietary intake, exercise, blood sugar log, in 2 weeks.  ? ?

## 2021-10-25 ENCOUNTER — Ambulatory Visit (HOSPITAL_COMMUNITY)
Admission: RE | Admit: 2021-10-25 | Discharge: 2021-10-25 | Disposition: A | Discharge status: Home / Self Care | Payer: Commercial Managed Care - HMO | Source: Ambulatory Visit | Attending: Urology | Admitting: Urology

## 2021-10-25 DIAGNOSIS — N1339 Other hydronephrosis: Secondary | ICD-10-CM

## 2021-10-25 DIAGNOSIS — Z436 Encounter for attention to other artificial openings of urinary tract: Secondary | ICD-10-CM | POA: Diagnosis present

## 2021-10-25 DIAGNOSIS — N132 Hydronephrosis with renal and ureteral calculous obstruction: Secondary | ICD-10-CM | POA: Insufficient documentation

## 2021-10-25 HISTORY — PX: IR NEPHROSTOMY EXCHANGE RIGHT: IMG6070

## 2021-10-25 MED ORDER — LIDOCAINE HCL 1 % IJ SOLN
INTRAMUSCULAR | Status: AC
  Filled 2021-10-25: qty 20

## 2021-10-25 MED ORDER — IOHEXOL 300 MG/ML  SOLN
50.0000 mL | Freq: Once | INTRAMUSCULAR | Status: AC | PRN
  Administered 2021-10-25: 20 mL

## 2021-10-25 NOTE — Procedures (Signed)
Pre Procedure Dx: Hydronephrosis d/t renal stone in pregnant patient.  ?Post Procedure Dx: Same ? ?Successful right sided PCN exchange and up-sizing to 12 Fr.  ? ?EBL: None ?No immediate complications.  ? ?Ronny Bacon, MD ?Pager #: [phone] ? ? ?

## 2021-10-28 ENCOUNTER — Encounter: Payer: Self-pay | Admitting: Obstetrics and Gynecology

## 2021-10-28 ENCOUNTER — Other Ambulatory Visit (HOSPITAL_COMMUNITY): Payer: Self-pay | Admitting: Psychiatry

## 2021-10-28 ENCOUNTER — Other Ambulatory Visit (HOSPITAL_COMMUNITY): Payer: Self-pay | Admitting: Interventional Radiology

## 2021-10-28 ENCOUNTER — Ambulatory Visit (INDEPENDENT_AMBULATORY_CARE_PROVIDER_SITE_OTHER): Payer: Managed Care, Other (non HMO) | Admitting: Obstetrics and Gynecology

## 2021-10-28 VITALS — BP 114/82 | HR 85

## 2021-10-28 DIAGNOSIS — F314 Bipolar disorder, current episode depressed, severe, without psychotic features: Secondary | ICD-10-CM

## 2021-10-28 DIAGNOSIS — O24419 Gestational diabetes mellitus in pregnancy, unspecified control: Secondary | ICD-10-CM

## 2021-10-28 DIAGNOSIS — O10919 Unspecified pre-existing hypertension complicating pregnancy, unspecified trimester: Secondary | ICD-10-CM

## 2021-10-28 DIAGNOSIS — Z3009 Encounter for other general counseling and advice on contraception: Secondary | ICD-10-CM | POA: Insufficient documentation

## 2021-10-28 DIAGNOSIS — O0990 Supervision of high risk pregnancy, unspecified, unspecified trimester: Secondary | ICD-10-CM

## 2021-10-28 DIAGNOSIS — O34219 Maternal care for unspecified type scar from previous cesarean delivery: Secondary | ICD-10-CM

## 2021-10-28 DIAGNOSIS — N1339 Other hydronephrosis: Secondary | ICD-10-CM

## 2021-10-28 DIAGNOSIS — K0889 Other specified disorders of teeth and supporting structures: Secondary | ICD-10-CM

## 2021-10-28 DIAGNOSIS — E039 Hypothyroidism, unspecified: Secondary | ICD-10-CM

## 2021-10-28 DIAGNOSIS — N200 Calculus of kidney: Secondary | ICD-10-CM

## 2021-10-28 NOTE — Progress Notes (Signed)
Pt states that she walked to her appointment and now she's having lower abdominal pain. Pt mentioned that she's having right Jaw pain also. ?

## 2021-10-28 NOTE — Patient Instructions (Signed)

## 2021-10-28 NOTE — Progress Notes (Signed)
Subjective:  ?Theresa Mccoy is a 41 y.o. [id number] at 13w1dbeing seen today for ongoing prenatal care.  She is currently monitored for the following issues for this high-risk pregnancy and has Bipolar disorder, current episode mixed, moderate (HEast [place]; Generalized anxiety disorder; UTI in pregnancy; History of small bowel obstruction; Hypercalcemia; Primary hyperparathyroidism (HEl [place]o; AKI (acute kidney injury) (H[place]; Renal calculus, right; Severe bipolar I disorder, current or most recent episode depressed (HHumac[place]; Supervision of high risk pregnancy, antepartum; Previous cesarean delivery x 2 affecting pregnancy, antepartum; S/P small bowel resection; H/O unilateral salpingectomy; Gestational diabetes mellitus (GDM) affecting pregnancy, antepartum; Pelvic adhesive disease; Chronic hypertension affecting pregnancy; Acquired hypothyroidism; Hypoglycemia associated with gestational diabetes (HBiron; Anemia in pregnancy, second trimester; Assistance needed with transportation; Unwanted fertility; and Pain, dental on their problem list. ? ?Patient reports general discomforts of pregnancy and LBP and groin pain. Had to walk to appt today. Tooth pain.  Contractions: Irritability. Vag. Bleeding: None.  Movement: Present. Denies leaking of fluid.  ? ?The following portions of the patient's history were reviewed and updated as appropriate: allergies, current medications, past family history, past medical history, past social history, past surgical history and problem list. Problem list updated. ? ?Objective:  ? ?Vitals:  ? 10/28/21 1117  ?BP: 114/82  ?Pulse: 85  ? ? ?Fetal Status: Fetal Heart Rate (bpm): 128   Movement: Present    ? ?General:  Alert, oriented and cooperative. Patient is in no acute distress.  ?Skin: Skin is warm and dry. No rash noted.   ?Cardiovascular: Normal heart rate noted  ?Respiratory: Normal respiratory effort, no problems with respiration noted  ?Abdomen: Soft, gravid, appropriate for gestational age.  Pain/Pressure: Present     ?Pelvic:  Cervical exam deferred        ?Extremities: Normal range of motion.  Edema: None  ?Mental Status: Normal mood and affect. Normal behavior. Normal judgment and thought content.  ? ?Urinalysis:     ? ?Assessment and Plan:  ?Pregnancy: [id number] 369w1d ?1. Supervision of high risk pregnancy, antepartum ?Stable ?Will case manager see pt to discuss transportation issues ? ?2. Chronic hypertension affecting pregnancy ?BP stable ?Continue with serial growth scans and antenatal testing as per protocol. ? ? ?3. Gestational diabetes mellitus (GDM) affecting pregnancy, antepartum ?CBG's in goal range ? ?4. Renal calculus, right ?Had stent replaced last week. ?Referral for urology placed ?- Ambulatory referral to Urology ? ?5. Previous cesarean delivery x 2 affecting pregnancy, antepartum ?For repeat ? ?6. Acquired hypothyroidism ?Stable ? ?7. Severe bipolar I disorder, current or most recent episode depressed (HCMolalla?Stable on current meds ? ?8. Unwanted fertility ?BTL papers signed today ? ?9. Pain, dental ?Referral to dental placed and dental letter provided ? ?Preterm labor symptoms and general obstetric precautions including but not limited to vaginal bleeding, contractions, leaking of fluid and fetal movement were reviewed in detail with the patient. ?Please refer to After Visit Summary for other counseling recommendations.  ?Return in about 2 weeks (around 11/11/2021) for OB visit, face to face, MD only. ? ? ?ErChancy MilroyMD ?

## 2021-10-29 ENCOUNTER — Ambulatory Visit (INDEPENDENT_AMBULATORY_CARE_PROVIDER_SITE_OTHER): Payer: 59 | Admitting: Licensed Clinical Social Worker

## 2021-10-29 ENCOUNTER — Encounter (HOSPITAL_COMMUNITY): Payer: Self-pay | Admitting: Licensed Clinical Social Worker

## 2021-10-29 ENCOUNTER — Encounter: Payer: Self-pay | Admitting: Obstetrics and Gynecology

## 2021-10-29 ENCOUNTER — Encounter (HOSPITAL_COMMUNITY): Payer: Self-pay

## 2021-10-29 DIAGNOSIS — F314 Bipolar disorder, current episode depressed, severe, without psychotic features: Secondary | ICD-10-CM

## 2021-10-29 DIAGNOSIS — F411 Generalized anxiety disorder: Secondary | ICD-10-CM
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
## 2021-10-29 NOTE — Plan of Care (Signed)
Pt agreeable to plan  ?

## 2021-10-30 ENCOUNTER — Ambulatory Visit: Payer: Managed Care, Other (non HMO) | Attending: Maternal & Fetal Medicine

## 2021-10-30 ENCOUNTER — Ambulatory Visit: Payer: Managed Care, Other (non HMO) | Admitting: *Deleted

## 2021-10-30 ENCOUNTER — Other Ambulatory Visit: Payer: Self-pay | Admitting: Maternal & Fetal Medicine

## 2021-10-30 ENCOUNTER — Other Ambulatory Visit: Payer: Self-pay | Admitting: *Deleted

## 2021-10-30 VITALS — BP 110/74 | HR 79

## 2021-10-30 DIAGNOSIS — O34219 Maternal care for unspecified type scar from previous cesarean delivery: Secondary | ICD-10-CM | POA: Diagnosis not present

## 2021-10-30 DIAGNOSIS — O0990 Supervision of high risk pregnancy, unspecified, unspecified trimester: Secondary | ICD-10-CM

## 2021-10-30 DIAGNOSIS — O09523 Supervision of elderly multigravida, third trimester: Secondary | ICD-10-CM | POA: Diagnosis not present

## 2021-10-30 DIAGNOSIS — O283 Abnormal ultrasonic finding on antenatal screening of mother: Secondary | ICD-10-CM | POA: Insufficient documentation

## 2021-10-30 DIAGNOSIS — F319 Bipolar disorder, unspecified: Secondary | ICD-10-CM | POA: Diagnosis present

## 2021-10-30 DIAGNOSIS — O24415 Gestational diabetes mellitus in pregnancy, controlled by oral hypoglycemic drugs: Secondary | ICD-10-CM

## 2021-10-30 DIAGNOSIS — Z9079 Acquired absence of other genital organ(s): Secondary | ICD-10-CM

## 2021-10-30 DIAGNOSIS — O09522 Supervision of elderly multigravida, second trimester: Secondary | ICD-10-CM | POA: Insufficient documentation

## 2021-10-30 DIAGNOSIS — Z9049 Acquired absence of other specified parts of digestive tract: Secondary | ICD-10-CM | POA: Insufficient documentation

## 2021-10-30 DIAGNOSIS — O10912 Unspecified pre-existing hypertension complicating pregnancy, second trimester: Secondary | ICD-10-CM | POA: Diagnosis present

## 2021-10-30 DIAGNOSIS — O2623 Pregnancy care for patient with recurrent pregnancy loss, third trimester: Secondary | ICD-10-CM

## 2021-10-30 DIAGNOSIS — D649 Anemia, unspecified: Secondary | ICD-10-CM

## 2021-10-30 DIAGNOSIS — E039 Hypothyroidism, unspecified: Secondary | ICD-10-CM

## 2021-10-30 DIAGNOSIS — O10013 Pre-existing essential hypertension complicating pregnancy, third trimester: Secondary | ICD-10-CM | POA: Diagnosis not present

## 2021-10-30 DIAGNOSIS — O24419 Gestational diabetes mellitus in pregnancy, unspecified control: Secondary | ICD-10-CM | POA: Insufficient documentation

## 2021-10-30 DIAGNOSIS — O99013 Anemia complicating pregnancy, third trimester: Secondary | ICD-10-CM

## 2021-10-30 DIAGNOSIS — Z3A32 32 weeks gestation of pregnancy: Secondary | ICD-10-CM

## 2021-10-30 DIAGNOSIS — O10913 Unspecified pre-existing hypertension complicating pregnancy, third trimester: Secondary | ICD-10-CM

## 2021-10-30 DIAGNOSIS — O99283 Endocrine, nutritional and metabolic diseases complicating pregnancy, third trimester: Secondary | ICD-10-CM

## 2021-10-30 NOTE — Procedures (Signed)
Theresa Mccoy ?04/11/1981 ?[redacted]w[redacted]d? ?Fetus A Non-Stress Test Interpretation for 10/30/21 ? ?Indication: Unsatisfactory BPP ? ?Fetal Heart Rate A ?Mode: External ?Baseline Rate (A): 150 bpm ?Variability: Moderate ?Accelerations: 15 x 15 ?Decelerations: None ?Multiple birth?: No ? ?Uterine Activity ?Mode: Palpation, Toco ?Contraction Frequency (min): none ?Resting Tone Palpated: Relaxed ? ?Interpretation (Fetal Testing) ?Nonstress Test Interpretation: Reactive ?Overall Impression: Reassuring for gestational age ?Comments: Dr. FAnnamaria Bootsreviewed tracing. ? ? ?

## 2021-11-05 ENCOUNTER — Other Ambulatory Visit: Payer: Managed Care, Other (non HMO)

## 2021-11-06 ENCOUNTER — Encounter (HOSPITAL_COMMUNITY): Payer: Medicaid Other

## 2021-11-08 ENCOUNTER — Ambulatory Visit: Payer: Commercial Managed Care - HMO | Admitting: *Deleted

## 2021-11-08 ENCOUNTER — Ambulatory Visit: Payer: Commercial Managed Care - HMO | Attending: Obstetrics

## 2021-11-08 VITALS — BP 110/79 | HR 88

## 2021-11-08 DIAGNOSIS — O24415 Gestational diabetes mellitus in pregnancy, controlled by oral hypoglycemic drugs: Secondary | ICD-10-CM | POA: Diagnosis not present

## 2021-11-08 DIAGNOSIS — Z9049 Acquired absence of other specified parts of digestive tract: Secondary | ICD-10-CM

## 2021-11-08 DIAGNOSIS — O10013 Pre-existing essential hypertension complicating pregnancy, third trimester: Secondary | ICD-10-CM

## 2021-11-08 DIAGNOSIS — Z3A33 33 weeks gestation of pregnancy: Secondary | ICD-10-CM

## 2021-11-08 DIAGNOSIS — E039 Hypothyroidism, unspecified: Secondary | ICD-10-CM

## 2021-11-08 DIAGNOSIS — O34219 Maternal care for unspecified type scar from previous cesarean delivery: Secondary | ICD-10-CM

## 2021-11-08 DIAGNOSIS — O99013 Anemia complicating pregnancy, third trimester: Secondary | ICD-10-CM

## 2021-11-08 DIAGNOSIS — O24419 Gestational diabetes mellitus in pregnancy, unspecified control: Secondary | ICD-10-CM | POA: Diagnosis present

## 2021-11-08 DIAGNOSIS — O09523 Supervision of elderly multigravida, third trimester: Secondary | ICD-10-CM

## 2021-11-08 DIAGNOSIS — O10913 Unspecified pre-existing hypertension complicating pregnancy, third trimester: Secondary | ICD-10-CM | POA: Insufficient documentation

## 2021-11-08 DIAGNOSIS — D649 Anemia, unspecified: Secondary | ICD-10-CM

## 2021-11-08 DIAGNOSIS — O2623 Pregnancy care for patient with recurrent pregnancy loss, third trimester: Secondary | ICD-10-CM

## 2021-11-08 DIAGNOSIS — O0990 Supervision of high risk pregnancy, unspecified, unspecified trimester: Secondary | ICD-10-CM

## 2021-11-08 DIAGNOSIS — Z9079 Acquired absence of other genital organ(s): Secondary | ICD-10-CM

## 2021-11-08 DIAGNOSIS — N200 Calculus of kidney: Secondary | ICD-10-CM

## 2021-11-08 DIAGNOSIS — O26833 Pregnancy related renal disease, third trimester: Secondary | ICD-10-CM

## 2021-11-08 DIAGNOSIS — O99280 Endocrine, nutritional and metabolic diseases complicating pregnancy, unspecified trimester: Secondary | ICD-10-CM

## 2021-11-11 ENCOUNTER — Ambulatory Visit (INDEPENDENT_AMBULATORY_CARE_PROVIDER_SITE_OTHER): Payer: Medicaid Other | Admitting: Obstetrics & Gynecology

## 2021-11-11 VITALS — BP 115/87 | HR 78 | Wt 142.0 lb

## 2021-11-11 DIAGNOSIS — O0990 Supervision of high risk pregnancy, unspecified, unspecified trimester: Secondary | ICD-10-CM

## 2021-11-11 DIAGNOSIS — O34219 Maternal care for unspecified type scar from previous cesarean delivery: Secondary | ICD-10-CM

## 2021-11-11 DIAGNOSIS — E039 Hypothyroidism, unspecified: Secondary | ICD-10-CM

## 2021-11-11 DIAGNOSIS — O24419 Gestational diabetes mellitus in pregnancy, unspecified control: Secondary | ICD-10-CM

## 2021-11-11 DIAGNOSIS — E210 Primary hyperparathyroidism: Secondary | ICD-10-CM

## 2021-11-11 NOTE — Progress Notes (Signed)
? ?PRENATAL VISIT NOTE ? ?Subjective:  ?Theresa Mccoy is a 41 y.o. [id number] at 12w1dbeing seen today for ongoing prenatal care.  She is currently monitored for the following issues for this high-risk pregnancy and has Bipolar disorder, current episode mixed, moderate (HWatchtower; Generalized anxiety disorder; UTI in pregnancy; History of small bowel obstruction; Hypercalcemia; Primary hyperparathyroidism (HMineral Ridge; AKI (acute kidney injury) (HC[place]; Renal calculus, right; Severe bipolar I disorder, current or most recent episode depressed (HSalton Sea [place]; Supervision of high risk pregnancy, antepartum; Previous cesarean delivery x 2 affecting pregnancy, antepartum; S/P small bowel resection; H/O unilateral salpingectomy; Gestational diabetes mellitus (GDM) affecting pregnancy, antepartum; Pelvic adhesive disease; Chronic hypertension affecting pregnancy; Acquired hypothyroidism; Hypoglycemia associated with gestational diabetes (HLevelock; Anemia in pregnancy, second trimester; Assistance needed with transportation; Unwanted fertility; and Pain, dental on their problem list. ? ?Patient reports no complaints.  Contractions: Not present. Vag. Bleeding: None.  Movement: Present. Denies leaking of fluid.  ? ?The following portions of the patient's history were reviewed and updated as appropriate: allergies, current medications, past family history, past medical history, past social history, past surgical history and problem list.  ? ?Objective:  ? ?Vitals:  ? 11/11/21 1328  ?BP: 115/87  ?Pulse: 78  ?Weight: 142 lb (64.4 kg)  ? ? ?Fetal Status: Fetal Heart Rate (bpm): 143   Movement: Present    ? ?General:  Alert, oriented and cooperative. Patient is in no acute distress.  ?Skin: Skin is warm and dry. No rash noted.   ?Cardiovascular: Normal heart rate noted  ?Respiratory: Normal respiratory effort, no problems with respiration noted  ?Abdomen: Soft, gravid, appropriate for gestational age.  Pain/Pressure: Absent     ?Pelvic: Cervical exam  deferred        ?Extremities: Normal range of motion.     ?Mental Status: Normal mood and affect. Normal behavior. Normal judgment and thought content.  ? ?Assessment and Plan:  ?Pregnancy: [id number] 351w1d1. Gestational diabetes mellitus (GDM) affecting pregnancy, antepartum ?FBs 90% in range, PP good control on diet ? ?2. Supervision of high risk pregnancy, antepartum ? ? ? ?3. Previous cesarean delivery x 2 affecting pregnancy, antepartum ? ? ?4. Acquired hypothyroidism ? ?- TSH ? ?5. Primary hyperparathyroidism (HCTioga?- Calcium ? ?Preterm labor symptoms and general obstetric precautions including but not limited to vaginal bleeding, contractions, leaking of fluid and fetal movement were reviewed in detail with the patient. ?Please refer to After Visit Summary for other counseling recommendations.  ? ?Return in about 2 weeks (around 11/25/2021). ? ?Future Appointments  ?Date Time Provider [place]?11/14/2021  2:15 PM WMC-MFC NURSE WMC-MFC WMC  ?11/14/2021  2:30 PM WMC-MFC US3 WMC-MFCUS WMC  ?11/21/2021  2:30 PM WMC-MFC NURSE WMC-MFC WMC  ?11/21/2021  2:45 PM WMC-MFC US6 WMC-MFCUS WMC  ?11/25/2021  2:35 PM Anyanwu, UgSallyanne HaversMD [facility]?11/27/2021  1:00 PM [place], CiLunette StandsNP GCBH-OPC None  ?11/27/2021  2:15 PM WMC-MFC NURSE WMC-MFC WMC  ?11/27/2021  2:30 PM WMC-MFC US3 WMC-MFCUS WMC  ?11/28/2021  2:00 PM Gold[place]mer, AdBaruch MerlLCSW GCBH-OPC None  ?12/03/2021 10:55 AM EcDione PloveraAnnice NeedyMD [facility]Re[facility] Mexico[facility]?12/05/2021  2:15 PM WMC-MFC NURSE WMC-MFC WMC  ?12/05/2021  2:30 PM WMC-MFC US3 WMC-MFCUS WMC  ?12/10/2021 10:55 AM EcDione PloverMaAnnice NeedyMD WMConne[facility]?12/17/2021 10:55 AM EcDione PloverMaAnnice NeedyMD WMAcad[facility] SuiteM[facility]?12/20/2021  3:30 PM WL-IR 1 WL-IR [place]  ?12/26/2021  2:00 PM GoDory HornLCSW GCBH-OPC None  ?02/21/2022 10:10 AM Shamleffer, IbMammie Lorenzo  Amedeo Kinsman, MD LBPC-LBENDO None  ? ? ?Emeterio Reeve, MD ?

## 2021-11-12 LAB — CALCIUM: Calcium: 8.3 mg/dL — ABNORMAL LOW (ref 8.7–10.2)

## 2021-11-12 LAB — TSH: TSH: 1.63 u[IU]/mL (ref 0.450–4.500)

## 2021-11-14 ENCOUNTER — Ambulatory Visit: Payer: Commercial Managed Care - HMO | Admitting: *Deleted

## 2021-11-14 ENCOUNTER — Telehealth: Payer: Self-pay

## 2021-11-14 ENCOUNTER — Ambulatory Visit: Payer: Commercial Managed Care - HMO | Attending: Obstetrics and Gynecology

## 2021-11-14 VITALS — BP 129/84 | HR 71

## 2021-11-14 DIAGNOSIS — O24415 Gestational diabetes mellitus in pregnancy, controlled by oral hypoglycemic drugs: Secondary | ICD-10-CM | POA: Diagnosis present

## 2021-11-14 DIAGNOSIS — O10013 Pre-existing essential hypertension complicating pregnancy, third trimester: Secondary | ICD-10-CM | POA: Diagnosis not present

## 2021-11-14 DIAGNOSIS — Z9049 Acquired absence of other specified parts of digestive tract: Secondary | ICD-10-CM | POA: Diagnosis present

## 2021-11-14 DIAGNOSIS — Z9079 Acquired absence of other genital organ(s): Secondary | ICD-10-CM

## 2021-11-14 DIAGNOSIS — O99283 Endocrine, nutritional and metabolic diseases complicating pregnancy, third trimester: Secondary | ICD-10-CM

## 2021-11-14 DIAGNOSIS — O0990 Supervision of high risk pregnancy, unspecified, unspecified trimester: Secondary | ICD-10-CM | POA: Insufficient documentation

## 2021-11-14 DIAGNOSIS — Z3A34 34 weeks gestation of pregnancy: Secondary | ICD-10-CM

## 2021-11-14 DIAGNOSIS — O24419 Gestational diabetes mellitus in pregnancy, unspecified control: Secondary | ICD-10-CM | POA: Insufficient documentation

## 2021-11-14 DIAGNOSIS — O10913 Unspecified pre-existing hypertension complicating pregnancy, third trimester: Secondary | ICD-10-CM | POA: Insufficient documentation

## 2021-11-14 DIAGNOSIS — D649 Anemia, unspecified: Secondary | ICD-10-CM

## 2021-11-14 DIAGNOSIS — O34219 Maternal care for unspecified type scar from previous cesarean delivery: Secondary | ICD-10-CM | POA: Insufficient documentation

## 2021-11-14 DIAGNOSIS — O2623 Pregnancy care for patient with recurrent pregnancy loss, third trimester: Secondary | ICD-10-CM

## 2021-11-14 DIAGNOSIS — O09523 Supervision of elderly multigravida, third trimester: Secondary | ICD-10-CM | POA: Diagnosis not present

## 2021-11-14 DIAGNOSIS — O99013 Anemia complicating pregnancy, third trimester: Secondary | ICD-10-CM

## 2021-11-14 DIAGNOSIS — E039 Hypothyroidism, unspecified: Secondary | ICD-10-CM

## 2021-11-14 NOTE — Telephone Encounter (Signed)
Left message: patient needs to move [place] appointment to Wednesday 5/17 due to staffing  ?Requested patient call office back. ?

## 2021-11-19 ENCOUNTER — Other Ambulatory Visit: Payer: Self-pay | Admitting: Urology

## 2021-11-20 ENCOUNTER — Ambulatory Visit: Payer: Medicaid Other | Attending: Obstetrics

## 2021-11-20 ENCOUNTER — Ambulatory Visit: Payer: Medicaid Other

## 2021-11-21 ENCOUNTER — Other Ambulatory Visit: Payer: Self-pay

## 2021-11-21 ENCOUNTER — Ambulatory Visit: Payer: Self-pay

## 2021-11-25 ENCOUNTER — Ambulatory Visit (INDEPENDENT_AMBULATORY_CARE_PROVIDER_SITE_OTHER): Payer: Medicaid Other | Admitting: Obstetrics & Gynecology

## 2021-11-25 ENCOUNTER — Other Ambulatory Visit (HOSPITAL_COMMUNITY)
Admission: RE | Admit: 2021-11-25 | Discharge: 2021-11-25 | Disposition: A | Discharge status: Home / Self Care | Payer: Medicaid Other | Source: Ambulatory Visit | Attending: Obstetrics & Gynecology | Admitting: Obstetrics & Gynecology

## 2021-11-25 VITALS — BP 111/76 | HR 81 | Wt 146.5 lb

## 2021-11-25 DIAGNOSIS — Z3493 Encounter for supervision of normal pregnancy, unspecified, third trimester: Secondary | ICD-10-CM | POA: Insufficient documentation

## 2021-11-25 DIAGNOSIS — O0990 Supervision of high risk pregnancy, unspecified, unspecified trimester: Secondary | ICD-10-CM

## 2021-11-25 DIAGNOSIS — O24419 Gestational diabetes mellitus in pregnancy, unspecified control: Secondary | ICD-10-CM

## 2021-11-25 DIAGNOSIS — O09523 Supervision of elderly multigravida, third trimester: Secondary | ICD-10-CM

## 2021-11-25 DIAGNOSIS — O10919 Unspecified pre-existing hypertension complicating pregnancy, unspecified trimester: Secondary | ICD-10-CM

## 2021-11-25 DIAGNOSIS — Z3A36 36 weeks gestation of pregnancy: Secondary | ICD-10-CM

## 2021-11-25 DIAGNOSIS — O34219 Maternal care for unspecified type scar from previous cesarean delivery: Secondary | ICD-10-CM

## 2021-11-25 DIAGNOSIS — N200 Calculus of kidney: Secondary | ICD-10-CM

## 2021-11-25 NOTE — Progress Notes (Signed)
PRENATAL VISIT NOTE  Subjective:  Theresa Mccoy is a 41 y.o. [id number] at 103w1dbeing seen today for ongoing prenatal care.  She is currently monitored for the following issues for this low-risk pregnancy and has Bipolar disorder, current episode mixed, moderate (HBristol Bay; Generalized anxiety disorder; UTI in pregnancy; History of small bowel obstruction; Hypercalcemia; Primary hyperparathyroidism (HWal[place]; AKI (acute kidney injury) (HFish Springs; Renal calculus, right; Severe bipolar I disorder, current or most recent episode depressed (HCandlewood Lake; Supervision of high risk pregnancy, antepartum; Previous cesarean delivery x 2 affecting pregnancy, antepartum; S/P small bowel resection; H/O unilateral salpingectomy; Gestational diabetes mellitus (GDM) affecting pregnancy, antepartum; Pelvic adhesive disease; Chronic hypertension affecting pregnancy; Acquired hypothyroidism; Hypoglycemia associated with gestational diabetes (HAlvordton; Anemia in pregnancy, second trimester; Assistance needed with transportation; Unwanted fertility; Pain, dental; and AMA (advanced maternal age) multigravida 312+ third trimester on their problem list.  Patient reports no complaints.  Contractions: Irritability. Vag. Bleeding: None.  Movement: Present. Denies leaking of fluid.   The following portions of the patient's history were reviewed and updated as appropriate: allergies, current medications, past family history, past medical history, past social history, past surgical history and problem list.   Objective:   Vitals:   11/25/21 1435  BP: 111/76  Pulse: 81  Weight: 146 lb 8 oz (66.5 kg)    Fetal Status: Fetal Heart Rate (bpm): 145   Movement: Present     General:  Alert, oriented and cooperative. Patient is in no acute distress.  Skin: Skin is warm and dry. No rash noted.   Cardiovascular: Normal heart rate noted  Respiratory: Normal respiratory effort, no problems with respiration noted  Abdomen: Soft, gravid, appropriate for  gestational age.  Pain/Pressure: Present     Pelvic: Cervical exam deferred but pelvic cultures collected in presence of chaperone        Extremities: Normal range of motion.  Edema: None  Mental Status: Normal mood and affect. Normal behavior. Normal judgment and thought content.   Assessment and Plan:  Pregnancy: [id number] [redacted]w[redacted]d. Gestational diabetes mellitus (GDM) affecting pregnancy, antepartum Did not bring log but reports good CBGs. Continue diet control.  2. Chronic hypertension affecting pregnancy Stable BP. Follow up  3. Previous cesarean delivery x 2 affecting pregnancy, antepartum Already scheduled for RCS at 39 weeks.  4. Renal calculus, right Has R nephrostomy tube in place. Incontinent of urine, wears adult diapers.  5. [redacted] weeks gestation of pregnancy 6. Supervision of high risk pregnancy, antepartum 7. AMA (advanced maternal age) multigravida 3543+third trimester Pelvic cultures done, will follow up results and manage accordingly. - Culture, beta strep (group b only) - GC/Chlamydia probe amp ([place])not at ARThe Alexandria Ophthalmology Asc LLCLabor symptoms and general obstetric precautions including but not limited to vaginal bleeding, contractions, leaking of fluid and fetal movement were reviewed in detail with the patient. Please refer to After Visit Summary for other counseling recommendations.   Return in about 1 week (around 12/02/2021) for OFFICE OB VISIT (MD only).  Future Appointments  Date Time Provider DeF[place]5/24/2023  1:00 PM PaSalley SlaughterNP GCBH-OPC None  11/27/2021  2:15 PM WMC-MFC NURSE WMC-MFC WMTen[facility] Surgical [facility] LLC5/24/2023  2:30 PM WMC-MFC US3 WMC-MFCUS [facility]5/25/2023  2:00 PM GoDory HornLCSW GCBH-OPC None  12/03/2021 10:55 AM EcClarnce FlockMD WMMidwest Surgical Hospital LLCMRehabilitation [facility]6/07/2021  2:15 PM WMC-MFC NURSE WMC-MFC WMRex Surgery [facility]6/07/2021  2:30 PM WMC-MFC US3 WMC-MFCUS WMSt[facility]6/12/2021 10:55 AM EcClarnce FlockMD WMTuscan Surgery Center At Las ColinasMBerg[facility]6/03/2022  2:00 PM WL-PADML PAT 4 WL-PADML None   12/17/2021 10:55 AM Clarnce Flock, MD [facility]  12/20/2021  3:30 PM WL-IR 1 WL-IR [place]  12/26/2021  2:00 PM Dory Horn, LCSW GCBH-OPC None  02/21/2022 10:10 AM Shamleffer, Melanie Crazier, MD LBPC-LBENDO None    Verita Schneiders, MD

## 2021-11-25 NOTE — Patient Instructions (Signed)
Return to office for any scheduled appointments. Call the office or go to the MAU at [facility] if: You begin to have strong, frequent contractions Your water breaks.  Sometimes it is a big gush of fluid, sometimes it is just a trickle that keeps getting your underwear wet or running down your legs You have vaginal bleeding.  It is normal to have a small amount of spotting if your cervix was checked.  You do not feel your baby moving like normal.  If you do not, get something to eat and drink and lay down and focus on feeling your baby move.   If your baby is still not moving like normal, you should call the office or go to MAU. Any other obstetric concerns.  

## 2021-11-26 LAB — GC/CHLAMYDIA PROBE AMP (~~LOC~~) NOT AT ARMC
Chlamydia: NEGATIVE
Comment: NEGATIVE
Comment: NORMAL
Neisseria Gonorrhea: NEGATIVE

## 2021-11-27 ENCOUNTER — Ambulatory Visit: Payer: Medicaid Other | Attending: Obstetrics and Gynecology

## 2021-11-27 ENCOUNTER — Encounter (HOSPITAL_COMMUNITY): Payer: Self-pay | Admitting: Psychiatry

## 2021-11-27 ENCOUNTER — Other Ambulatory Visit: Payer: Self-pay | Admitting: *Deleted

## 2021-11-27 ENCOUNTER — Ambulatory Visit: Payer: Medicaid Other | Admitting: *Deleted

## 2021-11-27 ENCOUNTER — Telehealth (INDEPENDENT_AMBULATORY_CARE_PROVIDER_SITE_OTHER): Payer: Medicaid Other | Admitting: Psychiatry

## 2021-11-27 VITALS — BP 114/80 | HR 90

## 2021-11-27 DIAGNOSIS — F3162 Bipolar disorder, current episode mixed, moderate: Secondary | ICD-10-CM | POA: Insufficient documentation

## 2021-11-27 DIAGNOSIS — O24410 Gestational diabetes mellitus in pregnancy, diet controlled: Secondary | ICD-10-CM

## 2021-11-27 DIAGNOSIS — F411 Generalized anxiety disorder: Secondary | ICD-10-CM | POA: Insufficient documentation

## 2021-11-27 DIAGNOSIS — E039 Hypothyroidism, unspecified: Secondary | ICD-10-CM

## 2021-11-27 DIAGNOSIS — O24419 Gestational diabetes mellitus in pregnancy, unspecified control: Secondary | ICD-10-CM | POA: Insufficient documentation

## 2021-11-27 DIAGNOSIS — I10 Essential (primary) hypertension: Secondary | ICD-10-CM | POA: Insufficient documentation

## 2021-11-27 DIAGNOSIS — N200 Calculus of kidney: Secondary | ICD-10-CM

## 2021-11-27 DIAGNOSIS — O99891 Other specified diseases and conditions complicating pregnancy: Secondary | ICD-10-CM

## 2021-11-27 DIAGNOSIS — O24415 Gestational diabetes mellitus in pregnancy, controlled by oral hypoglycemic drugs: Secondary | ICD-10-CM | POA: Diagnosis present

## 2021-11-27 DIAGNOSIS — Z3A36 36 weeks gestation of pregnancy: Secondary | ICD-10-CM | POA: Insufficient documentation

## 2021-11-27 DIAGNOSIS — Z8719 Personal history of other diseases of the digestive system: Secondary | ICD-10-CM | POA: Insufficient documentation

## 2021-11-27 DIAGNOSIS — O34219 Maternal care for unspecified type scar from previous cesarean delivery: Secondary | ICD-10-CM

## 2021-11-27 DIAGNOSIS — O99013 Anemia complicating pregnancy, third trimester: Secondary | ICD-10-CM

## 2021-11-27 DIAGNOSIS — F314 Bipolar disorder, current episode depressed, severe, without psychotic features: Secondary | ICD-10-CM | POA: Diagnosis not present

## 2021-11-27 DIAGNOSIS — O2623 Pregnancy care for patient with recurrent pregnancy loss, third trimester: Secondary | ICD-10-CM

## 2021-11-27 DIAGNOSIS — O09523 Supervision of elderly multigravida, third trimester: Secondary | ICD-10-CM

## 2021-11-27 DIAGNOSIS — D649 Anemia, unspecified: Secondary | ICD-10-CM

## 2021-11-27 DIAGNOSIS — O10913 Unspecified pre-existing hypertension complicating pregnancy, third trimester: Secondary | ICD-10-CM | POA: Insufficient documentation

## 2021-11-27 DIAGNOSIS — O99280 Endocrine, nutritional and metabolic diseases complicating pregnancy, unspecified trimester: Secondary | ICD-10-CM

## 2021-11-27 DIAGNOSIS — Z9079 Acquired absence of other genital organ(s): Secondary | ICD-10-CM

## 2021-11-27 DIAGNOSIS — O0990 Supervision of high risk pregnancy, unspecified, unspecified trimester: Secondary | ICD-10-CM

## 2021-11-27 DIAGNOSIS — O10013 Pre-existing essential hypertension complicating pregnancy, third trimester: Secondary | ICD-10-CM

## 2021-11-27 DIAGNOSIS — Z9049 Acquired absence of other specified parts of digestive tract: Secondary | ICD-10-CM

## 2021-11-27 MED ORDER — HALOPERIDOL 1 MG PO TABS: 2.0000 mg | ORAL_TABLET | Freq: Every day | ORAL | 3 refills | Status: DC

## 2021-11-27 NOTE — Progress Notes (Signed)
[place] MD/PA/NP OP Progress Note Virtual Visit via Telephone Note  I connected with Theresa Mccoy on 11/27/21 at  1:00 PM EDT by telephone and verified that I am speaking with the correct person using two identifiers.  Location: Patient: home Provider: Clinic   I discussed the limitations, risks, security and privacy concerns of performing an evaluation and management service by telephone and the availability of in person appointments. I also discussed with the patient that there may be a patient responsible charge related to this service. The patient expressed understanding and agreed to proceed.   I provided 30 minutes of non-face-to-face time during this encounter.  11/27/2021 1:23 PM [name]  MRN:  [id number]  Chief Complaint: "I have been anxious about the arrival of my son"  HPI: 41 year old female seen today for follow-up psychiatric evaluation.  She has a psychiatric history of bipolar disorder, depression, and anxiety.  Currently she is managed on Haldol 2 mg nightly which was started in March 2023.  Patient reports she finds her medications effective in managing her psychiatric conditions.  Today she was unable to logon virtually so assessment is done over the phone.  During exam she was pleasant, cooperative, and engaged in conversation.  Patient notes that she has mild anxiety related to the birth of her son Theresa Mccoy in a few weeks.  Provider conducted a GAD-7 and patient scored 0.  Provider also conducted PHQ-9 patient scored an 8.  Patient notes that her sleep fluctuates due to pregnancy.  She also notes that she has an increased appetite that she relates to pregnancy.  Today she denies SI/HI/VAH, mania, paranoia.  Patient informed Probation officer that she would like to receive disability.  She informed Probation officer that she is working with her caseworker to accomplish this.  At this time no medication changes made.  Patient agreeable to continue medications as prescribed.  No other concerns  noted at this time. Visit Diagnosis:    ICD-10-CM   1. Severe bipolar I disorder, current or most recent episode depressed (HCC)  F31.4 haloperidol (HALDOL) 1 MG tablet      Past Psychiatric History: bipolar disorder, depression, and anxiety  Past Medical History:  Past Medical History:  Diagnosis Date   AKI (acute kidney injury) ([place]) 01/22/2020   Anemia    Depression    Diabetes mellitus without complication ([place])    Essential hypertension 02/02/2017   Hypernatremia 01/22/2020   Hyperthyroidism    PTSD (post-traumatic stress disorder)    SBO (small bowel obstruction) ([place]) 07/07/2019   Sepsis ([place]) 01/22/2020    Past Surgical History:  Procedure Laterality Date   BOWEL RESECTION N/A 07/08/2019   Procedure: Small Bowel Resection;  Surgeon: Clovis Riley, MD;  Location: [facility];  Service: General;  Laterality: N/A;   CESAREAN SECTION     DILATION AND CURETTAGE OF UTERUS     HAND SURGERY Left    IR NEPHROSTOMY EXCHANGE RIGHT  10/25/2021   IR NEPHROSTOMY PLACEMENT RIGHT  09/27/2021   LAPAROTOMY N/A 07/08/2019   Procedure: EXPLORATORY LAPAROTOMY;  Surgeon: Clovis Riley, MD;  Location: [facility];  Service: General;  Laterality: N/A;   OOPHORECTOMY Right    PARATHYROIDECTOMY Right 09/13/2021   Procedure: RIGHT INFERIOR PARATHYROIDECTOMY;  Surgeon: Armandina Gemma, MD;  Location: [facility];  Service: General;  Laterality: Right;   SALPINGECTOMY      Family Psychiatric History: Notes that he cousin has mental health conditions however she is unsure of which one  Family History:  Family  History  Problem Relation Age of Onset   Diabetes Brother    Asthma Paternal Grandmother     Social History:  Social History   Socioeconomic History   Marital status: Married    Spouse name: Not on file   Number of children: 2   Years of education: Not on file   Highest education level: GED or equivalent  Occupational History   Occupation: Unemployed  Tobacco Use   Smoking status: Some  Days    Packs/day: 0.25    Types: Cigarettes   Smokeless tobacco: Never  Vaping Use   Vaping Use: Never used  Substance and Sexual Activity   Alcohol use: 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  She also stated that she is unemployed.   Social Determinants of Health   Financial Resource Strain: High Risk   Difficulty of Paying Living Expenses: Very hard  Food Insecurity: Landscape architect Present   Worried About Charity fundraiser in the Last Year: Sometimes true   Arboriculturist in the Last Year: Sometimes true  Transportation Needs: Public librarian (Medical): Yes   Lack of Transportation (Non-Medical): Yes  Physical Activity: Inactive   Days of Exercise per Week: 0 days   Minutes of Exercise per Session: 0 min  Stress: Stress Concern Present   Feeling of Stress : Very much  Social Connections: Socially Isolated   Frequency of Communication with Friends and Family: Once a week   Frequency of Social Gatherings with Friends and Family: Never   Attends Religious Services: Never   Marine scientist or Organizations: No   Attends Archivist Meetings: Never   Marital Status: Married    Allergies:  Allergies  Allergen Reactions   Codeine Other (See Comments)    Constipation     Metabolic Disorder Labs: Lab Results  Component Value Date   HGBA1C 5.7 (H) 06/21/2021   No results found for: PROLACTIN No results found for: CHOL, TRIG, HDL, CHOLHDL, VLDL, LDLCALC Lab Results  Component Value Date   TSH 1.630 11/11/2021   TSH 0.04 (L) 10/07/2021     Therapeutic Level Labs: No results found for: LITHIUM No results found for: VALPROATE No components found for:  CBMZ  Current Medications: Current Outpatient Medications  Medication Sig Dispense Refill   Accu-Chek Softclix Lancets lancets Use four times daily as instructed. 100 each 12   acetaminophen (TYLENOL) 325 MG tablet Take 2 tablets (650 mg total) by mouth every 4 (four) hours as needed (for pain scale < 4  OR  temperature  >/=  100.5 F).     aspirin EC 81 MG tablet Take 1 tablet (81 mg total) by mouth daily. Swallow whole. 30 tablet 11   Blood Pressure Monitoring (BLOOD PRESSURE MONITOR AUTOMAT) DEVI 1 Device by Does not apply route daily. Automatic blood pressure cuff with regular cuff. Blood pressure to be monitored regularly at home. ICD-10 code: O09.90. 1 kit 0   calcium carbonate (TUMS - DOSED IN  MG ELEMENTAL CALCIUM) 500 MG chewable tablet Chew 2 tablets (400 mg of elemental calcium total) by mouth 2 (two) times daily. 360 tablet 1   Cholecalciferol (VITAMIN D3) 50 MCG (2000 UT) capsule Take 1 capsule (2,000 Units total) by mouth daily. 90 capsule 3   famotidine (PEPCID) 20 MG tablet TAKE 1 TABLET(20 MG) BY MOUTH DAILY 90 tablet 3   glucose blood test strip Use as instructed 100 each 12   haloperidol (HALDOL) 1 MG tablet Take 2 tablets (2 mg total) by mouth at bedtime. 60 tablet 3   levothyroxine (SYNTHROID) 50 MCG tablet Take 1 tablet (50 mcg total) by mouth daily at 6 (six) AM. (Patient not taking: Reported on 11/25/2021) 90 tablet 3   polyethylene glycol (MIRALAX / GLYCOLAX) 17 g packet Take 17 g by mouth 2 (two) times daily. 60 each 2   Prenatal 27-1 MG TABS Take 1 tablet by mouth daily. 30 tablet 12   No current facility-administered medications for this visit.     Musculoskeletal: Strength & Muscle Tone:  unable to assess due to telephone visit Gait & Station:  Unable to assess due to telephone visit Patient leans: N/A  Psychiatric Specialty Exam: Review of  Systems  There were no vitals taken for this visit.There is no height or weight on file to calculate BMI.  General Appearance:  Unable to assess due to telephone visit  Eye Contact:   Able to assess due to telephone visit  Speech:  Clear and Coherent and Normal Rate  Volume:  Normal  Mood:  Euthymic  Affect:  Appropriate and Congruent  Thought Process:  Coherent, Goal Directed, and Linear  Orientation:  Full (Time, Place, and Person)  Thought Content: WDL and Logical   Suicidal Thoughts:  No  Homicidal Thoughts:  No  Memory:  Immediate;   Good Recent;   Good Remote;   Good  Judgement:  Good  Insight:  Good  Psychomotor Activity:   Unable to assess due to telephone visit  Concentration:  Concentration: Good and Attention Span: Good  Recall:  Good  Fund of Knowledge: Good  Language: Good  Akathisia:   Unable to assess due to telephone visit  Handed:  Right  AIMS (if indicated): not done  Assets:  Communication Skills Desire for Improvement Financial Resources/Insurance Housing Intimacy Leisure Time Physical Health Social Support  ADL's:  Intact  Cognition: WNL  Sleep:  Fair   Screenings: AIMS    Flowsheet Row Admission (Discharged) from 10/06/2018 in [facility] Total Score 0      AUDIT    Flowsheet Row Admission (Discharged) from 10/06/2018 in [facility]  Alcohol Use Disorder Identification Test Final Score (AUDIT) 0      GAD-7    Flowsheet Row Video [facility]20[facility] in [facility] Routine Prenatal from 11/25/2021 in [facility] for [place] [facility] Routine Prenatal from 11/11/2021 in [facility] for [place] [facility] for [facility] Counselor from [facility]2021 in [facility] Routine Prenatal from 10/09/2021 in Center for [place] [facility] for Women  Total GAD-7 Score  0 0 $R'1 8 1      'fv$ PHQ2-9    Flowsheet Row Video Visit from 11/27/2021 in [facility] Routine Prenatal from 11/25/2021 in Center for [place] [facility] for Women Routine Prenatal from 11/11/2021 in [facility] Counselor from 10/29/2021 in [facility] Routine Prenatal from 10/09/2021 in Center for [place] at [facility] for Women  PHQ-2 Total Score $RemoveBef'1 1 1 4 1  'HUlEzQQrMJ$ PHQ-9 Total Score $RemoveBef'8 1 1 19 2      'KHHJsWsazo$ Flowsheet Row Video Visit from 11/27/2021 in [facility] Counselor from 10/29/2021 in [facility] ED to Hosp-Admission (Discharged) from 09/19/2021 in [facility]S Maternity Assessment Unit  C-SSRS RISK CATEGORY Error: Q3, 4, or 5 should not be populated when Q2 is No Low Risk No Risk        Assessment and Plan: Patient endorses mild anxiety related to the birth of her new son.  She however reports that she is able to cope with it.  No medication changes made today.  Patient agreeable to continue medications as prescribed.  1. Severe bipolar I disorder, current or most recent episode depressed (HCC)  Continue- haloperidol (HALDOL) 1 MG tablet; Take 2 tablets (2 mg total) by mouth at bedtime.  Dispense: 60 tablet; Refill: 3   Collaboration of Care: Collaboration of Care: Other provider involved in patient's care AEB PCP  Patient/Guardian was advised Release of Information must be obtained prior to any record release in order to collaborate their care with an outside provider. Patient/Guardian was advised if they have not already done so to contact the registration department to sign all necessary forms in order for [place] to release information regarding their care.   Consent: Patient/Guardian gives verbal consent for treatment and assignment of benefits for services provided during this visit. Patient/Guardian  expressed understanding and agreed to proceed.    Salley Slaughter, NP 11/27/2021, 1:23 PM

## 2021-11-28 ENCOUNTER — Telehealth (HOSPITAL_COMMUNITY): Payer: Self-pay | Admitting: Licensed Clinical Social Worker

## 2021-11-28 ENCOUNTER — Encounter (HOSPITAL_COMMUNITY): Payer: Self-pay

## 2021-11-28 ENCOUNTER — Ambulatory Visit (HOSPITAL_COMMUNITY): Payer: Medicaid Other | Admitting: Licensed Clinical Social Worker

## 2021-11-28 NOTE — Telephone Encounter (Signed)
LCSW sent 2 links to patient's phone at 205 and at 208 with no response.  LCSW followed up with a phone call at 2:11 PM with no response. Phone rang for 1 minute straight before disconnecting.  LCSW stay in link until 215 before disconnecting.

## 2021-11-29 LAB — CULTURE, BETA STREP (GROUP B ONLY): Strep Gp B Culture: NEGATIVE

## 2021-12-03 ENCOUNTER — Encounter (HOSPITAL_COMMUNITY): Payer: Self-pay

## 2021-12-03 ENCOUNTER — Telehealth (HOSPITAL_COMMUNITY): Payer: Self-pay | Admitting: *Deleted

## 2021-12-03 ENCOUNTER — Ambulatory Visit (INDEPENDENT_AMBULATORY_CARE_PROVIDER_SITE_OTHER): Payer: Medicaid Other | Admitting: Family Medicine

## 2021-12-03 ENCOUNTER — Encounter: Payer: Self-pay | Admitting: Family Medicine

## 2021-12-03 VITALS — BP 117/81 | HR 91 | Wt 145.4 lb

## 2021-12-03 DIAGNOSIS — F3162 Bipolar disorder, current episode mixed, moderate: Secondary | ICD-10-CM

## 2021-12-03 DIAGNOSIS — O34219 Maternal care for unspecified type scar from previous cesarean delivery: Secondary | ICD-10-CM

## 2021-12-03 DIAGNOSIS — O10919 Unspecified pre-existing hypertension complicating pregnancy, unspecified trimester: Secondary | ICD-10-CM

## 2021-12-03 DIAGNOSIS — E210 Primary hyperparathyroidism: Secondary | ICD-10-CM

## 2021-12-03 DIAGNOSIS — O99012 Anemia complicating pregnancy, second trimester: Secondary | ICD-10-CM

## 2021-12-03 DIAGNOSIS — O09523 Supervision of elderly multigravida, third trimester: Secondary | ICD-10-CM

## 2021-12-03 DIAGNOSIS — R0789 Other chest pain: Secondary | ICD-10-CM

## 2021-12-03 DIAGNOSIS — Z9079 Acquired absence of other genital organ(s): Secondary | ICD-10-CM

## 2021-12-03 DIAGNOSIS — O24419 Gestational diabetes mellitus in pregnancy, unspecified control: Secondary | ICD-10-CM

## 2021-12-03 DIAGNOSIS — N200 Calculus of kidney: Secondary | ICD-10-CM

## 2021-12-03 DIAGNOSIS — O0990 Supervision of high risk pregnancy, unspecified, unspecified trimester: Secondary | ICD-10-CM

## 2021-12-03 DIAGNOSIS — Z3009 Encounter for other general counseling and advice on contraception: Secondary | ICD-10-CM

## 2021-12-03 DIAGNOSIS — Z8719 Personal history of other diseases of the digestive system: Secondary | ICD-10-CM

## 2021-12-03 DIAGNOSIS — E039 Hypothyroidism, unspecified: Secondary | ICD-10-CM

## 2021-12-03 NOTE — Progress Notes (Signed)
Subjective:  Theresa Mccoy is a 41 y.o. [id number] at 29w2dbeing seen today for ongoing prenatal care.  She is currently monitored for the following issues for this high-risk pregnancy and has Bipolar disorder, current episode mixed, moderate ([place]; Generalized anxiety disorder; UTI in pregnancy; History of small bowel obstruction; Hypercalcemia; Primary hyperparathyroidism ([place]; AKI (acute kidney injury) ([place]; Renal calculus, right; Severe bipolar I disorder, current or most recent episode depressed ([place]; Supervision of high risk pregnancy, antepartum; Previous cesarean delivery x 2 affecting pregnancy, antepartum; S/P small bowel resection; H/O unilateral salpingectomy; Gestational diabetes mellitus (GDM) affecting pregnancy, antepartum; Pelvic adhesive disease; Chronic hypertension affecting pregnancy; Acquired hypothyroidism; Hypoglycemia associated with gestational diabetes ([place]; Anemia in pregnancy, second trimester; Assistance needed with transportation; Unwanted fertility; Pain, dental; and AMA (advanced maternal age) multigravida 359+ third trimester on their problem list.  Patient reports  legs feeling weak .  Contractions: Irritability. Vag. Bleeding: None.  Movement: Present. Denies leaking of fluid.   The following portions of the patient's history were reviewed and updated as appropriate: allergies, current medications, past family history, past medical history, past social history, past surgical history and problem list. Problem list updated.  Objective:   Vitals:   12/03/21 1033  BP: 117/81  Pulse: 91  Weight: 145 lb 6.4 oz (66 kg)    Fetal Status: Fetal Heart Rate (bpm): 149   Movement: Present     General:  Alert, oriented and cooperative. Patient is in no acute distress.  Skin: Skin is warm and dry. No rash noted.   Cardiovascular: Normal heart rate noted  Respiratory: Normal respiratory effort, no problems with respiration noted  Abdomen: Soft, gravid, appropriate  for gestational age. Pain/Pressure: Present     Pelvic: Vag. Bleeding: None     Cervical exam deferred        Extremities: Normal range of motion.  Edema: Trace  Mental Status: Normal mood and affect. Normal behavior. Normal judgment and thought content.   Urinalysis:      Assessment and Plan:  Pregnancy: [id number] 342w2d1. Supervision of high risk pregnancy, antepartum BP and FHR normal Today reports intermittent chest pressure associated some shortness of breath Reports no associated nausea, sweating, or radiation of pain Lasts about 45 minutes and gets better if she takes slow deep breaths ECG from 09/2021 reviewed, no significant abnormalities, has never had Echo done that I can see Atypical in nature, does not seem like angina but has many risk factors and comorbidities, will refer to cardio OB for further evaluationw  2. Previous cesarean delivery x 2 affecting pregnancy, antepartum Scheduled for RCS on Sunday 12/15/2021 with Dr. PiIlda Bassetr. BaElgie Congocheduled to be second attending that day Given hx of prior abdominal surgeries with dense adhesions noted during small bowel resection will send a message to see if consideration should be given to doing delivery on weekday when Gen Surg would be more immediately available  3. Chronic hypertension affecting pregnancy Normotensive, no meds, on ASA  4. Primary hyperparathyroidism (HCR[place]insS/p parathyroidectomy earlier in this pregnancy Calcium slightly low at last endo visit and again on 11/11/2021, advised to increase TUMS intake  5. Gestational diabetes mellitus (GDM) affecting pregnancy, antepartum No meds Log forgotten at home From recall she reports values in the 90's for fasting as well as post prandials in the 90's Last growth US[place] 11/27/2021 showed EFW 28%, AFI 8.5  6. Acquired hypothyroidism On synthroid, last TSH from 11/11/2021 appropriate at 1.6  7. Renal calculus, right  S/p nephrostomy tube placement this pregnancy  8.  Bipolar disorder, current episode mixed, moderate (HCC) On haldol 2 mg nightly  9. History of small bowel obstruction Op note reviewed, see above  10. H/O unilateral salpingectomy   11. Anemia in pregnancy, second trimester Lab Results  Component Value Date   HGB 8.6 (L) 10/09/2021  S/p IV venofer  12. Unwanted fertility Declines BTL, desires one more attempt at having a girl  68. AMA (advanced maternal age) multigravida 69+, third trimester Weekly testing reassuring to date  Term labor symptoms and general obstetric precautions including but not limited to vaginal bleeding, contractions, leaking of fluid and fetal movement were reviewed in detail with the patient. Please refer to After Visit Summary for other counseling recommendations.  Return in 1 week (on 12/10/2021) for [facility], ob visit, needs MD.   Clarnce Flock, MD

## 2021-12-03 NOTE — Patient Instructions (Signed)
Theresa Mccoy  12/03/2021   Your procedure is scheduled on:  12/15/2021  Arrive at 6 at [employer] [address] on [address] at [facility]  and [address]. You are invited to use the FREE valet parking or use the Visitor's parking deck.  Pick up the phone at the desk and dial [phone].  Call this number if you have problems the morning of surgery: [phone]  Remember:   Do not eat food:(After Midnight) Desps de medianoche.  Do not drink clear liquids: (After Midnight) Desps de medianoche.  Take these medicines the morning of surgery with A SIP OF WATER:  Take levothyroxine as prescribed   Do not wear jewelry, make-up or nail polish.  Do not wear lotions, powders, or perfumes. Do not wear deodorant.  Do not shave 48 hours prior to surgery.  Do not bring valuables to the hospital.  [facility] is not   responsible for any belongings or valuables brought to the hospital.  Contacts, dentures or bridgework may not be worn into surgery.  Leave suitcase in the car. After surgery it may be brought to your room.  For patients admitted to the hospital, checkout time is 11:00 AM the day of              discharge.      Please read over the following fact sheets that you were given:     Preparing for Surgery

## 2021-12-03 NOTE — Telephone Encounter (Signed)
Preadmission screen  

## 2021-12-03 NOTE — Patient Instructions (Signed)

## 2021-12-03 NOTE — Progress Notes (Signed)
Pt states feels like legs are very week today especially when she got out of the car to come here today. Pt states that she has been having heart pains, she claims about 4 weeks now. Pt did not bring Glucose numbers today.

## 2021-12-04 ENCOUNTER — Telehealth (HOSPITAL_COMMUNITY): Payer: Self-pay | Admitting: *Deleted

## 2021-12-04 ENCOUNTER — Encounter: Payer: Self-pay | Admitting: Family Medicine

## 2021-12-04 NOTE — Telephone Encounter (Signed)
Preadmission screen  

## 2021-12-05 ENCOUNTER — Encounter (HOSPITAL_COMMUNITY): Payer: Self-pay

## 2021-12-05 ENCOUNTER — Ambulatory Visit: Payer: Medicaid Other | Admitting: *Deleted

## 2021-12-05 ENCOUNTER — Ambulatory Visit: Payer: Medicaid Other | Attending: Obstetrics and Gynecology

## 2021-12-05 ENCOUNTER — Telehealth (HOSPITAL_COMMUNITY): Payer: Self-pay | Admitting: *Deleted

## 2021-12-05 ENCOUNTER — Encounter: Payer: Self-pay | Admitting: Family Medicine

## 2021-12-05 VITALS — BP 119/80 | HR 81

## 2021-12-05 DIAGNOSIS — O99013 Anemia complicating pregnancy, third trimester: Secondary | ICD-10-CM

## 2021-12-05 DIAGNOSIS — Z9079 Acquired absence of other genital organ(s): Secondary | ICD-10-CM | POA: Diagnosis present

## 2021-12-05 DIAGNOSIS — O10913 Unspecified pre-existing hypertension complicating pregnancy, third trimester: Secondary | ICD-10-CM | POA: Insufficient documentation

## 2021-12-05 DIAGNOSIS — O10013 Pre-existing essential hypertension complicating pregnancy, third trimester: Secondary | ICD-10-CM | POA: Diagnosis not present

## 2021-12-05 DIAGNOSIS — E039 Hypothyroidism, unspecified: Secondary | ICD-10-CM

## 2021-12-05 DIAGNOSIS — Z9049 Acquired absence of other specified parts of digestive tract: Secondary | ICD-10-CM

## 2021-12-05 DIAGNOSIS — O09523 Supervision of elderly multigravida, third trimester: Secondary | ICD-10-CM | POA: Diagnosis not present

## 2021-12-05 DIAGNOSIS — O0990 Supervision of high risk pregnancy, unspecified, unspecified trimester: Secondary | ICD-10-CM | POA: Insufficient documentation

## 2021-12-05 DIAGNOSIS — O99283 Endocrine, nutritional and metabolic diseases complicating pregnancy, third trimester: Secondary | ICD-10-CM

## 2021-12-05 DIAGNOSIS — N200 Calculus of kidney: Secondary | ICD-10-CM

## 2021-12-05 DIAGNOSIS — O34219 Maternal care for unspecified type scar from previous cesarean delivery: Secondary | ICD-10-CM

## 2021-12-05 DIAGNOSIS — D649 Anemia, unspecified: Secondary | ICD-10-CM

## 2021-12-05 DIAGNOSIS — O24419 Gestational diabetes mellitus in pregnancy, unspecified control: Secondary | ICD-10-CM | POA: Diagnosis present

## 2021-12-05 DIAGNOSIS — Z3A37 37 weeks gestation of pregnancy: Secondary | ICD-10-CM

## 2021-12-05 DIAGNOSIS — O2620 Pregnancy care for patient with recurrent pregnancy loss, unspecified trimester: Secondary | ICD-10-CM

## 2021-12-05 DIAGNOSIS — O24415 Gestational diabetes mellitus in pregnancy, controlled by oral hypoglycemic drugs: Secondary | ICD-10-CM

## 2021-12-05 DIAGNOSIS — O99891 Other specified diseases and conditions complicating pregnancy: Secondary | ICD-10-CM

## 2021-12-05 NOTE — Telephone Encounter (Signed)
Preadmission screen  

## 2021-12-06 ENCOUNTER — Telehealth (HOSPITAL_COMMUNITY): Payer: Self-pay | Admitting: *Deleted

## 2021-12-06 ENCOUNTER — Encounter (HOSPITAL_COMMUNITY): Payer: Self-pay

## 2021-12-06 ENCOUNTER — Other Ambulatory Visit: Payer: Self-pay | Admitting: Obstetrics and Gynecology

## 2021-12-06 NOTE — Telephone Encounter (Signed)
Preop appt changed from 6/9 to 6/7.  No voicemail available.  MyChart message sent

## 2021-12-09 ENCOUNTER — Telehealth: Payer: Self-pay

## 2021-12-09 ENCOUNTER — Other Ambulatory Visit: Payer: Self-pay

## 2021-12-09 ENCOUNTER — Encounter (HOSPITAL_COMMUNITY): Payer: Self-pay | Admitting: Emergency Medicine

## 2021-12-09 ENCOUNTER — Inpatient Hospital Stay (HOSPITAL_COMMUNITY)
Admission: AD | Admit: 2021-12-09 | Discharge: 2021-12-09 | Disposition: A | Discharge status: Home / Self Care | Payer: Medicaid Other | Attending: Obstetrics and Gynecology | Admitting: Obstetrics and Gynecology

## 2021-12-09 DIAGNOSIS — Z3A38 38 weeks gestation of pregnancy: Secondary | ICD-10-CM | POA: Insufficient documentation

## 2021-12-09 DIAGNOSIS — O163 Unspecified maternal hypertension, third trimester: Secondary | ICD-10-CM | POA: Insufficient documentation

## 2021-12-09 DIAGNOSIS — B9689 Other specified bacterial agents as the cause of diseases classified elsewhere: Secondary | ICD-10-CM | POA: Insufficient documentation

## 2021-12-09 DIAGNOSIS — N390 Urinary tract infection, site not specified: Secondary | ICD-10-CM | POA: Insufficient documentation

## 2021-12-09 DIAGNOSIS — O99891 Other specified diseases and conditions complicating pregnancy: Secondary | ICD-10-CM | POA: Diagnosis not present

## 2021-12-09 DIAGNOSIS — R079 Chest pain, unspecified: Secondary | ICD-10-CM | POA: Insufficient documentation

## 2021-12-09 DIAGNOSIS — O2343 Unspecified infection of urinary tract in pregnancy, third trimester: Secondary | ICD-10-CM | POA: Diagnosis not present

## 2021-12-09 DIAGNOSIS — O10913 Unspecified pre-existing hypertension complicating pregnancy, third trimester: Secondary | ICD-10-CM | POA: Diagnosis not present

## 2021-12-09 LAB — URINALYSIS, ROUTINE W REFLEX MICROSCOPIC
Bilirubin Urine: NEGATIVE
Glucose, UA: NEGATIVE mg/dL
Hgb urine dipstick: NEGATIVE
Ketones, ur: NEGATIVE mg/dL
Nitrite: POSITIVE — AB
Protein, ur: 100 mg/dL — AB
Specific Gravity, Urine: 1.025 (ref 1.005–1.030)
pH: 6 (ref 5.0–8.0)

## 2021-12-09 LAB — PROTEIN / CREATININE RATIO, URINE
Creatinine, Urine: 248.04 mg/dL
Protein Creatinine Ratio: 0.52 mg/mg{Cre} — ABNORMAL HIGH (ref 0.00–0.15)
Total Protein, Urine: 128 mg/dL

## 2021-12-09 LAB — COMPREHENSIVE METABOLIC PANEL
ALT: 13 U/L (ref 0–44)
AST: 17 U/L (ref 15–41)
Albumin: 2.7 g/dL — ABNORMAL LOW (ref 3.5–5.0)
Alkaline Phosphatase: 196 U/L — ABNORMAL HIGH (ref 38–126)
Anion gap: 7 (ref 5–15)
BUN: 11 mg/dL (ref 6–20)
CO2: 20 mmol/L — ABNORMAL LOW (ref 22–32)
Calcium: 8.5 mg/dL — ABNORMAL LOW (ref 8.9–10.3)
Chloride: 111 mmol/L (ref 98–111)
Creatinine, Ser: 1.1 mg/dL — ABNORMAL HIGH (ref 0.44–1.00)
GFR, Estimated: >60 mL/min (ref >60)
Glucose, Bld: 67 mg/dL — ABNORMAL LOW (ref 70–99)
Potassium: 3.4 mmol/L — ABNORMAL LOW (ref 3.5–5.1)
Sodium: 138 mmol/L (ref 135–145)
Total Bilirubin: 0.4 mg/dL (ref 0.3–1.2)
Total Protein: 6.7 g/dL (ref 6.5–8.1)

## 2021-12-09 LAB — CBC
HCT: 34.9 % — ABNORMAL LOW (ref 36.0–46.0)
Hemoglobin: 11.5 g/dL — ABNORMAL LOW (ref 12.0–15.0)
MCH: 31.3 pg (ref 26.0–34.0)
MCHC: 33 g/dL (ref 30.0–36.0)
MCV: 94.8 fL (ref 80.0–100.0)
Platelets: 262 10*3/uL (ref 150–400)
RBC: 3.68 MIL/uL — ABNORMAL LOW (ref 3.87–5.11)
RDW: 13.1 % (ref 11.5–15.5)
WBC: 12 10*3/uL — ABNORMAL HIGH (ref 4.0–10.5)
nRBC: 0 % (ref 0.0–0.2)

## 2021-12-09 LAB — TROPONIN I (HIGH SENSITIVITY): Troponin I (High Sensitivity): 11 ng/L (ref <18)

## 2021-12-09 MED ORDER — CEFADROXIL 500 MG PO CAPS: 500.0000 mg | ORAL_CAPSULE | Freq: Two times a day (BID) | ORAL | 0 refills | Status: AC

## 2021-12-09 NOTE — ED Triage Notes (Signed)
Patient here for evaluation of chest pain and shortness of breath that started at 1000 and is brought on by laying flat. Patient is [redacted] weeks gestation. G12P2. Patient is alert, oriented, speaking in complete sentences, and in no apparent distress at this time.

## 2021-12-09 NOTE — Telephone Encounter (Signed)
Pt called nurse voicemail. Called placed back to pt. Spoke with pt. Pt states having "heart and chest pain" especially when she lies down and having trouble breathing. Pt instructed to go to MAU now. Pt is scheduled for C-section on 6/9. Pt verbalized understanding.  Shelda Pal

## 2021-12-09 NOTE — ED Provider Triage Note (Signed)
Emergency Medicine Provider Triage Evaluation Note  Theresa Mccoy , a 41 y.o. female  was evaluated in triage.  Pt complains of chest pain for the past week.  Pt reports she also has low back pain and swelling in her leg.  Pt is [redacted] weeks pregnant  Review of Systems  Positive: Chest pain and back pain Negative: No fever  Physical Exam  BP (!) 135/94 (BP Location: Right Arm)   Pulse 88   Temp 99 F (37.2 C) (Oral)   Resp 18   LMP  (Approximate)   SpO2 99%  Gen:   Awake, no distress   Resp:  Normal effort  MSK:   Moves extremities without difficulty  Other:    Medical Decision Making  Medically screening exam initiated at 2:43 PM.  Appropriate orders placed.  Theresa Mccoy was informed that the remainder of the evaluation will be completed by another provider, this initial triage assessment does not replace that evaluation, and the importance of remaining in the ED until their evaluation is complete.  EKg no acute abnormality,  Pt advised [place]. Provider notified   Theresa Mccoy, [place] 12/09/21 1446

## 2021-12-09 NOTE — MAU Note (Signed)
...  Theresa Mccoy is a 41 y.o. at 2w1dhere in MAU reporting: She reports she is experiencing abdominal pain when her baby moves. She reports she is also experiencing lower back pain that began on her way to the ED. She reports she was also experiencing "heart pain" with lying down but reports she is no longer feeling this. Denies VB or LOF. +FM.  FHT: 135 initial external Lab orders placed from triage:  UA

## 2021-12-09 NOTE — MAU Provider Note (Addendum)
History     CSN: [id number]  Arrival date and time: 12/09/21 1409   Event Date/Time   First Provider Initiated Contact with Patient 12/09/21 1605      Chief Complaint  Patient presents with   Chest Pain   Theresa Mccoy is a 41 y.o. [id number] at [redacted]w[redacted]d with a PMHx of GDM, cHTN, hyperparathyroidism, hypothyroidism, bipolar disorder, and cesarean section x2 who presents to the MAU with 1 day history of chest pain, pelvic pain, and low back pain. Chest pain began at 10am while she was lying down. Pain was at the center of her chest, 10/10, sharp, and did not radiate. The pain lasted for 2-3 hours and improved by 1pm. She endorsed SOB with the pain. She denied palpitations, diaphoresis, fever, and chills. She endorsed pelvic pain and low back pain that began around 1pm. She tried Tylenol which did not improve her pain. Pelvic pain is worse with fetal movement. Low back pain is sharp and doesn't radiate. She denies LOF, VB, vaginal discharge, vaginal itching, and dysuria.  Chest Pain  Associated symptoms include abdominal pain and shortness of breath. Pertinent negatives include no cough, diaphoresis, fever, nausea, palpitations or vomiting.   OB History     Gravida  12   Para  2   Term  2   Preterm      AB  9   Living  2      SAB  8   IAB      Ectopic  1   Multiple      Live Births  2           Past Medical History:  Diagnosis Date   AKI (acute kidney injury) ([facility]) 01/22/2020   Anemia    Depression    Diabetes mellitus without complication ([place])    Essential hypertension 02/02/2017   Gestational diabetes    Hypernatremia 01/22/2020   Hyperthyroidism    Hypothyroidism    PTSD (post-traumatic stress disorder)    SBO (small bowel obstruction) ([facility]) 07/07/2019   Sepsis ([place]) 01/22/2020    Past Surgical History:  Procedure Laterality Date   BOWEL RESECTION N/A 07/08/2019   Procedure: Small Bowel Resection;  Surgeon: Clovis Riley, MD;  Location: [facility];   Service: General;  Laterality: N/A;   CESAREAN SECTION     DILATION AND CURETTAGE OF UTERUS     HAND SURGERY Left    IR NEPHROSTOMY EXCHANGE RIGHT  10/25/2021   IR NEPHROSTOMY PLACEMENT RIGHT  09/27/2021   LAPAROTOMY N/A 07/08/2019   Procedure: EXPLORATORY LAPAROTOMY;  Surgeon: Clovis Riley, MD;  Location: [facility];  Service: General;  Laterality: N/A;   OOPHORECTOMY Right    PARATHYROIDECTOMY Right 09/13/2021   Procedure: RIGHT INFERIOR PARATHYROIDECTOMY;  Surgeon: Armandina Gemma, MD;  Location: [facility];  Service: General;  Laterality: Right;   SALPINGECTOMY      Family History  Problem Relation Age of Onset   Diabetes Brother    Asthma Paternal Grandmother     Social History   Tobacco Use   Smoking status: Some Days    Packs/day: 0.25    Types: Cigarettes   Smokeless tobacco: Never  Vaping Use   Vaping Use: Never used  Substance Use Topics   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 Swallow whole. 30 tablet 11 12/08/2021   Cholecalciferol (VITAMIN D3) 50 MCG (2000 UT) capsule Take 1 capsule (2,000 Units total) by mouth daily. 90 capsule 3 12/08/2021   levothyroxine (SYNTHROID) 50 MCG tablet Take 1 tablet (50 mcg total) by mouth daily at 6 (six) AM. 90 tablet 3 12/08/2021   polyethylene glycol (MIRALAX / GLYCOLAX) 17 g packet Take 17 g by mouth 2 (two) times daily. 60 each 2 12/08/2021   Prenatal 27-1 MG TABS Take 1 tablet by mouth daily. 30 tablet 12 12/08/2021   Accu-Chek Softclix Lancets lancets Use four times daily as instructed. 100 each 12    acetaminophen (TYLENOL) 325 MG tablet Take 2 tablets (650 mg total) by mouth every 4 (four) hours as needed (for  pain scale < 4  OR  temperature  >/=  100.5 F).      Blood Pressure Monitoring (BLOOD PRESSURE MONITOR AUTOMAT) DEVI 1 Device by Does not apply route daily. Automatic blood pressure cuff with regular cuff. Blood pressure to be monitored regularly at home. ICD-10 code: O09.90. 1 kit 0    calcium carbonate (TUMS - DOSED IN MG ELEMENTAL CALCIUM) 500 MG chewable tablet Chew 2 tablets (400 mg of elemental calcium total) by mouth 2 (two) times daily. (Patient taking differently: Chew 2 tablets by mouth every evening.) 360 tablet 1    famotidine (PEPCID) 20 MG tablet TAKE 1 TABLET(20 MG) BY MOUTH DAILY 90 tablet 3    glucose blood test strip Use as instructed 100 each 12    haloperidol (HALDOL) 1 MG tablet Take 2 tablets (2 mg total) by mouth at bedtime. 60 tablet 3     Review of Systems  Constitutional:  Negative for chills, diaphoresis and fever.  Respiratory:  Positive for shortness of breath. Negative for cough.   Cardiovascular:  Positive for chest pain. Negative for palpitations.  Gastrointestinal:  Positive for abdominal pain. Negative for constipation, nausea and vomiting.  Genitourinary:  Positive for pelvic pain. Negative for dysuria, vaginal bleeding, vaginal discharge and vaginal pain.  Neurological:  Negative for light-headedness.  Physical Exam   Blood pressure 123/83, pulse 78, temperature 98.8 F (37.1 C), temperature source Oral, resp. rate 19, height 5' (1.524 m), weight 67.7 kg, SpO2 100 %.  Physical Exam HENT:     Head: Normocephalic and atraumatic.  Cardiovascular:     Rate and Rhythm: Normal rate and regular rhythm.     Heart sounds: Normal heart sounds.  Abdomen:    Tenderness to palpation in suprapubic region Pulmonary:     Effort: Pulmonary effort is normal.     Breath sounds: No wheezing.  Musculoskeletal:     Right lower leg: Tenderness present.     Left lower leg: Tenderness present.  Skin:    General: Skin is warm and dry.  Neurological:     General: No  focal deficit present.     Mental Status: She is alert.   FHR: Baseline 160bpm, 15x15 accelerations, variable decels, moderate variability   MAU Course  Procedures  MDM EKG normal sinus rhythm. Troponin was normal. UA notable for positive nitrite, trace leukocytes, and many bacteria. UPC 0.52. CMP notable for Cr 1.10 and alk phosph 196. WBC 12.0.   Assessment and Plan  UTI - UA: nitrite,  leukocytes, bacteria - Start Duricef 500 mg BID x7 days  Chest pain, SOB - EKG and troponin normal - Resolved, likely due to position and pregnancy  Chronic hypertension - UPC 0.52, Cr 1.10 - Stable pressures here - Continue to monitor  Low back pain - Likely related to UTI and pregnancy  Pregnancy - Follow-up tomorrow with Dr. Dione Plover for routine prenatal visit - Return precautions discussed  Leonette Nutting 12/09/2021, 4:32 PM        Attestation of Supervision of Student:  I confirm that I have verified the information documented in the medical student's note and that I have also personally performed the history, physical exam and all medical decision making activities.  I have verified that all services and findings are accurately documented in this student's note; and I agree with management and plan as outlined in the documentation. I have also made any necessary editorial changes.  History Theresa Mccoy is a 41 y.o. [id number] at [redacted]w[redacted]d who presents for chest pain. Reports 3 hours episode of chest pain that occurred earlier today. Pain was sharp & caused shortness of breath. Started while lying down. Pain has since resolved without intervention. Also reports constant pelvic & low back pain. Denies headache, fever, visual disturbance, cough, epigastric pain. Reports good fetal movement.   Physical exam Temp:  [98.2 F (36.8 C)-99 F (37.2 C)] 98.6 F (37 C) (06/05 1829) Pulse Rate:  [63-88] 78 (06/05 1829) Resp:  [18-20] 20 (06/05 1829) BP: (113-147)/(67-100) 144/92 (06/05  1829) SpO2:  [99 %-100 %] 100 % (06/05 1829) Weight:  [67.7 kg] 67.7 kg (06/05 1519)  Physical Examination: General appearance - alert, well appearing, and in no distress Mental status - normal mood, behavior, speech, dress, motor activity, and thought processes Eyes - sclera anicteric Chest - normal respiratory effort Heart - normal rate, regular rhythm, normal S1, S2, no murmurs, rubs, clicks or gallops  NST:  Baseline: 145 bpm, Variability: Good {> 6 bpm), Accelerations: Reactive, and Decelerations: Variable: mild    Assessment/Plan 1. Chronic hypertension complicating or reason for care during pregnancy, third trimester  -BPs stable in MAU. Patient is asymptomatic. PCR elevated. Creatinine elevated which has been her baseline. Reviewed labs with Dr. Nehemiah Settle. Recommends patient to keep f/u in the office tomorrow.   2. Urinary tract infection in mother during third trimester of pregnancy  -Rx duricef per Dr. Nehemiah Settle. Urine culture pending.  -No evidence of pyelo at this time.   3. Chest pain during pregnancy  -Symptoms resolved. EKG reviewed by ED provider & Dr. Nehemiah Settle.  -Normal troponin  4. [redacted] weeks gestation of pregnancy      Jorje Guild, [place] for [employer], [place] 12/09/2021 6:56 PM

## 2021-12-10 ENCOUNTER — Encounter: Payer: Self-pay | Admitting: Family Medicine

## 2021-12-10 ENCOUNTER — Ambulatory Visit (INDEPENDENT_AMBULATORY_CARE_PROVIDER_SITE_OTHER): Payer: Medicaid Other | Admitting: Family Medicine

## 2021-12-10 VITALS — BP 136/94 | HR 79 | Wt 147.0 lb

## 2021-12-10 DIAGNOSIS — Z9079 Acquired absence of other genital organ(s): Secondary | ICD-10-CM

## 2021-12-10 DIAGNOSIS — O0990 Supervision of high risk pregnancy, unspecified, unspecified trimester: Secondary | ICD-10-CM

## 2021-12-10 DIAGNOSIS — N200 Calculus of kidney: Secondary | ICD-10-CM

## 2021-12-10 DIAGNOSIS — O09523 Supervision of elderly multigravida, third trimester: Secondary | ICD-10-CM

## 2021-12-10 DIAGNOSIS — F3162 Bipolar disorder, current episode mixed, moderate: Secondary | ICD-10-CM

## 2021-12-10 DIAGNOSIS — O99012 Anemia complicating pregnancy, second trimester: Secondary | ICD-10-CM

## 2021-12-10 DIAGNOSIS — O34219 Maternal care for unspecified type scar from previous cesarean delivery: Secondary | ICD-10-CM

## 2021-12-10 DIAGNOSIS — E039 Hypothyroidism, unspecified: Secondary | ICD-10-CM

## 2021-12-10 DIAGNOSIS — O10919 Unspecified pre-existing hypertension complicating pregnancy, unspecified trimester: Secondary | ICD-10-CM

## 2021-12-10 DIAGNOSIS — E210 Primary hyperparathyroidism: Secondary | ICD-10-CM

## 2021-12-10 DIAGNOSIS — Z9049 Acquired absence of other specified parts of digestive tract: Secondary | ICD-10-CM

## 2021-12-10 DIAGNOSIS — O24419 Gestational diabetes mellitus in pregnancy, unspecified control: Secondary | ICD-10-CM

## 2021-12-10 NOTE — Patient Instructions (Signed)

## 2021-12-10 NOTE — Progress Notes (Signed)
Subjective:  Theresa Mccoy is a 41 y.o. [id number] at 43w2dbeing seen today for ongoing prenatal care.  She is currently monitored for the following issues for this high-risk pregnancy and has Bipolar disorder, current episode mixed, moderate ([place]; Generalized anxiety disorder; UTI in pregnancy; History of small bowel obstruction; Hypercalcemia; Primary hyperparathyroidism ([place]; AKI (acute kidney injury) ([facility]; Renal calculus, right; Severe bipolar I disorder, current or most recent episode depressed ([place]; Supervision of high risk pregnancy, antepartum; Previous cesarean delivery x 2 affecting pregnancy, antepartum; S/P small bowel resection; H/O unilateral salpingectomy; Gestational diabetes mellitus (GDM) affecting pregnancy, antepartum; Pelvic adhesive disease; Chronic hypertension affecting pregnancy; Acquired hypothyroidism; Hypoglycemia associated with gestational diabetes ([place]; Anemia in pregnancy, second trimester; Assistance needed with transportation; Unwanted fertility; Pain, dental; and AMA (advanced maternal age) multigravida 390+ third trimester on their problem list.  Patient reports no complaints.  Contractions: Irritability. Vag. Bleeding: None.  Movement: Present. Denies leaking of fluid.   The following portions of the patient's history were reviewed and updated as appropriate: allergies, current medications, past family history, past medical history, past social history, past surgical history and problem list. Problem list updated.  Objective:   Vitals:   12/10/21 1111  BP: (!) 136/94  Pulse: 79  Weight: 147 lb (66.7 kg)    Fetal Status: Fetal Heart Rate (bpm): 141   Movement: Present     General:  Alert, oriented and cooperative. Patient is in no acute distress.  Skin: Skin is warm and dry. No rash noted.   Cardiovascular: Normal heart rate noted  Respiratory: Normal respiratory effort, no problems with respiration noted  Abdomen: Soft, gravid, appropriate for  gestational age. Pain/Pressure: Present     Pelvic: Vag. Bleeding: None     Cervical exam deferred        Extremities: Normal range of motion.     Mental Status: Normal mood and affect. Normal behavior. Normal judgment and thought content.   Urinalysis:      Assessment and Plan:  Pregnancy: [id number] 335w2d1. Supervision of high risk pregnancy, antepartum BP mild range FHR normal   2. Previous cesarean delivery x 2 affecting pregnancy, antepartum Scheduled for RCS in three days on weekday due to history of extensive adhesive disease noted on last op for perforated small bowel obstruction Discussed day and timing with her   3. Chronic hypertension affecting pregnancy No meds, on ASA   4. Primary hyperparathyroidism ([place]/p parathyroidectomy earlier in this pregnancy Calcium slightly low at last endo visit and again on 11/11/2021, advised to increase TUMS intake   5. Gestational diabetes mellitus (GDM) affecting pregnancy, antepartum No meds Log forgotten at home Last growth USKorean 11/27/2021 showed EFW 28%, AFI 8.5   6. Acquired hypothyroidism On synthroid, last TSH from 11/11/2021 appropriate at 1.6   7. Renal calculus, right S/p nephrostomy tube placement this pregnancy   8. Bipolar disorder, current episode mixed, moderate ([facility]) On haldol 2 mg nightly   9. History of small bowel obstruction Op note reviewed, see above   10. H/O unilateral salpingectomy     11. Anemia in pregnancy, second trimester Lab Results  Component Value Date   HGB 11.5 (L) 12/09/2021   S/p IV venofer   12. Unwanted fertility Declines BTL, desires one more attempt at having a girl   1372AMA (advanced maternal age) multigravida 3584+third trimester Weekly testing reassuring to date  Term labor symptoms and general obstetric precautions including but not limited to vaginal bleeding,  contractions, leaking of fluid and fetal movement were reviewed in detail with the patient. Please refer  to After Visit Summary for other counseling recommendations.  Return in 6 weeks (on 01/21/2022) for PP check, needs MD.   Clarnce Flock, MD

## 2021-12-11 ENCOUNTER — Encounter (HOSPITAL_COMMUNITY): Payer: Self-pay | Admitting: Obstetrics and Gynecology

## 2021-12-11 ENCOUNTER — Ambulatory Visit: Payer: Medicaid Other | Admitting: *Deleted

## 2021-12-11 ENCOUNTER — Inpatient Hospital Stay (HOSPITAL_COMMUNITY): Payer: Medicaid Other

## 2021-12-11 ENCOUNTER — Inpatient Hospital Stay (HOSPITAL_COMMUNITY)
Admission: AD | Admit: 2021-12-11 | Discharge: 2021-12-14 | DRG: 787 | Disposition: A | Discharge status: Home / Self Care | Payer: Medicaid Other | Attending: Obstetrics & Gynecology | Admitting: Obstetrics & Gynecology

## 2021-12-11 ENCOUNTER — Telehealth (HOSPITAL_COMMUNITY): Payer: Self-pay | Admitting: *Deleted

## 2021-12-11 ENCOUNTER — Other Ambulatory Visit: Payer: Self-pay | Admitting: Obstetrics

## 2021-12-11 ENCOUNTER — Encounter (HOSPITAL_COMMUNITY)
Admission: RE | Admit: 2021-12-11 | Discharge: 2021-12-11 | Disposition: A | Discharge status: Home / Self Care | Payer: Medicaid Other | Source: Ambulatory Visit | Attending: Family Medicine | Admitting: Family Medicine

## 2021-12-11 ENCOUNTER — Ambulatory Visit (HOSPITAL_BASED_OUTPATIENT_CLINIC_OR_DEPARTMENT_OTHER): Payer: Medicaid Other

## 2021-12-11 ENCOUNTER — Ambulatory Visit (HOSPITAL_BASED_OUTPATIENT_CLINIC_OR_DEPARTMENT_OTHER): Payer: Medicaid Other | Admitting: Maternal & Fetal Medicine

## 2021-12-11 ENCOUNTER — Inpatient Hospital Stay (HOSPITAL_COMMUNITY): Admit: 2021-12-11 | Payer: Commercial Managed Care - HMO | Admitting: Obstetrics and Gynecology

## 2021-12-11 ENCOUNTER — Encounter (HOSPITAL_COMMUNITY)
Admission: AD | Disposition: A | Discharge status: Home / Self Care | Payer: Self-pay | Source: Home / Self Care | Attending: Obstetrics & Gynecology

## 2021-12-11 VITALS — BP 126/85 | HR 81

## 2021-12-11 DIAGNOSIS — O99344 Other mental disorders complicating childbirth: Secondary | ICD-10-CM | POA: Diagnosis present

## 2021-12-11 DIAGNOSIS — O0990 Supervision of high risk pregnancy, unspecified, unspecified trimester: Principal | ICD-10-CM

## 2021-12-11 DIAGNOSIS — O10913 Unspecified pre-existing hypertension complicating pregnancy, third trimester: Secondary | ICD-10-CM

## 2021-12-11 DIAGNOSIS — O24419 Gestational diabetes mellitus in pregnancy, unspecified control: Secondary | ICD-10-CM

## 2021-12-11 DIAGNOSIS — O99333 Smoking (tobacco) complicating pregnancy, third trimester: Secondary | ICD-10-CM | POA: Diagnosis present

## 2021-12-11 DIAGNOSIS — D62 Acute posthemorrhagic anemia: Secondary | ICD-10-CM | POA: Diagnosis not present

## 2021-12-11 DIAGNOSIS — O1092 Unspecified pre-existing hypertension complicating childbirth: Secondary | ICD-10-CM | POA: Diagnosis present

## 2021-12-11 DIAGNOSIS — Z98891 History of uterine scar from previous surgery: Secondary | ICD-10-CM

## 2021-12-11 DIAGNOSIS — O24415 Gestational diabetes mellitus in pregnancy, controlled by oral hypoglycemic drugs: Secondary | ICD-10-CM

## 2021-12-11 DIAGNOSIS — Z3A38 38 weeks gestation of pregnancy: Secondary | ICD-10-CM | POA: Diagnosis not present

## 2021-12-11 DIAGNOSIS — D649 Anemia, unspecified: Secondary | ICD-10-CM

## 2021-12-11 DIAGNOSIS — O24420 Gestational diabetes mellitus in childbirth, diet controlled: Secondary | ICD-10-CM | POA: Diagnosis present

## 2021-12-11 DIAGNOSIS — O99284 Endocrine, nutritional and metabolic diseases complicating childbirth: Secondary | ICD-10-CM | POA: Diagnosis present

## 2021-12-11 DIAGNOSIS — O368390 Maternal care for abnormalities of the fetal heart rate or rhythm, unspecified trimester, not applicable or unspecified: Secondary | ICD-10-CM

## 2021-12-11 DIAGNOSIS — O34219 Maternal care for unspecified type scar from previous cesarean delivery: Secondary | ICD-10-CM

## 2021-12-11 DIAGNOSIS — O09523 Supervision of elderly multigravida, third trimester: Secondary | ICD-10-CM | POA: Insufficient documentation

## 2021-12-11 DIAGNOSIS — F411 Generalized anxiety disorder: Secondary | ICD-10-CM | POA: Diagnosis present

## 2021-12-11 DIAGNOSIS — O9902 Anemia complicating childbirth: Secondary | ICD-10-CM | POA: Diagnosis not present

## 2021-12-11 DIAGNOSIS — Z9049 Acquired absence of other specified parts of digestive tract: Secondary | ICD-10-CM | POA: Insufficient documentation

## 2021-12-11 DIAGNOSIS — O2620 Pregnancy care for patient with recurrent pregnancy loss, unspecified trimester: Secondary | ICD-10-CM

## 2021-12-11 DIAGNOSIS — O99280 Endocrine, nutritional and metabolic diseases complicating pregnancy, unspecified trimester: Secondary | ICD-10-CM | POA: Diagnosis not present

## 2021-12-11 DIAGNOSIS — O99013 Anemia complicating pregnancy, third trimester: Secondary | ICD-10-CM

## 2021-12-11 DIAGNOSIS — E210 Primary hyperparathyroidism: Secondary | ICD-10-CM | POA: Diagnosis present

## 2021-12-11 DIAGNOSIS — F3162 Bipolar disorder, current episode mixed, moderate: Secondary | ICD-10-CM | POA: Diagnosis present

## 2021-12-11 DIAGNOSIS — O26833 Pregnancy related renal disease, third trimester: Secondary | ICD-10-CM

## 2021-12-11 DIAGNOSIS — N200 Calculus of kidney: Secondary | ICD-10-CM

## 2021-12-11 DIAGNOSIS — Z885 Allergy status to narcotic agent status: Secondary | ICD-10-CM | POA: Diagnosis not present

## 2021-12-11 DIAGNOSIS — Z9079 Acquired absence of other genital organ(s): Secondary | ICD-10-CM | POA: Insufficient documentation

## 2021-12-11 DIAGNOSIS — O321XX0 Maternal care for breech presentation, not applicable or unspecified: Secondary | ICD-10-CM

## 2021-12-11 DIAGNOSIS — E039 Hypothyroidism, unspecified: Secondary | ICD-10-CM

## 2021-12-11 DIAGNOSIS — O99891 Other specified diseases and conditions complicating pregnancy: Secondary | ICD-10-CM

## 2021-12-11 DIAGNOSIS — O321XX1 Maternal care for breech presentation, fetus 1: Secondary | ICD-10-CM | POA: Diagnosis not present

## 2021-12-11 DIAGNOSIS — O10919 Unspecified pre-existing hypertension complicating pregnancy, unspecified trimester: Secondary | ICD-10-CM | POA: Diagnosis present

## 2021-12-11 DIAGNOSIS — F17210 Nicotine dependence, cigarettes, uncomplicated: Secondary | ICD-10-CM | POA: Diagnosis present

## 2021-12-11 DIAGNOSIS — O34211 Maternal care for low transverse scar from previous cesarean delivery: Secondary | ICD-10-CM

## 2021-12-11 LAB — CULTURE, OB URINE
Culture: >=100000 — AB
Special Requests: NORMAL

## 2021-12-11 LAB — CBC
HCT: 33.4 % — ABNORMAL LOW (ref 36.0–46.0)
Hemoglobin: 11.1 g/dL — ABNORMAL LOW (ref 12.0–15.0)
MCH: 31.5 pg (ref 26.0–34.0)
MCHC: 33.2 g/dL (ref 30.0–36.0)
MCV: 94.9 fL (ref 80.0–100.0)
Platelets: 262 10*3/uL (ref 150–400)
RBC: 3.52 MIL/uL — ABNORMAL LOW (ref 3.87–5.11)
RDW: 13.4 % (ref 11.5–15.5)
WBC: 11.6 10*3/uL — ABNORMAL HIGH (ref 4.0–10.5)
nRBC: 0 % (ref 0.0–0.2)

## 2021-12-11 LAB — GLUCOSE, CAPILLARY
Glucose-Capillary: 58 mg/dL — ABNORMAL LOW (ref 70–99)
Glucose-Capillary: 76 mg/dL (ref 70–99)

## 2021-12-11 LAB — TYPE AND SCREEN
ABO/RH(D): O POS
Antibody Screen: NEGATIVE

## 2021-12-11 SURGERY — Surgical Case
Anesthesia: Spinal

## 2021-12-11 MED ORDER — COCONUT OIL OIL: 1.0000 "application " | TOPICAL_OIL | Status: DC | PRN

## 2021-12-11 MED ORDER — ONDANSETRON HCL 4 MG/2ML IJ SOLN: 4.0000 mg | Freq: Once | INTRAMUSCULAR | Status: DC | PRN

## 2021-12-11 MED ORDER — PRENATAL 27-1 MG PO TABS
1.0000 | ORAL_TABLET | Freq: Every day | ORAL | Status: DC
Start: 2021-12-12 — End: 2021-12-11

## 2021-12-11 MED ORDER — OXYTOCIN-SODIUM CHLORIDE 30-0.9 UT/500ML-% IV SOLN
INTRAVENOUS | Status: AC
  Filled 2021-12-11: qty 500

## 2021-12-11 MED ORDER — MORPHINE SULFATE (PF) 0.5 MG/ML IJ SOLN
INTRAMUSCULAR | Status: AC
  Filled 2021-12-11: qty 10

## 2021-12-11 MED ORDER — AMISULPRIDE (ANTIEMETIC) 5 MG/2ML IV SOLN: 10.0000 mg | Freq: Once | INTRAVENOUS | Status: DC | PRN

## 2021-12-11 MED ORDER — SIMETHICONE 80 MG PO CHEW: 80.0000 mg | CHEWABLE_TABLET | ORAL | Status: DC | PRN

## 2021-12-11 MED ORDER — PHENYLEPHRINE HCL-NACL 20-0.9 MG/250ML-% IV SOLN
INTRAVENOUS | Status: AC
  Filled 2021-12-11: qty 250

## 2021-12-11 MED ORDER — FAMOTIDINE IN NACL 20-0.9 MG/50ML-% IV SOLN
20.0000 mg | Freq: Once | INTRAVENOUS | Status: AC
  Administered 2021-12-11: 20 mg via INTRAVENOUS
  Filled 2021-12-11: qty 50

## 2021-12-11 MED ORDER — FENTANYL CITRATE (PF) 100 MCG/2ML IJ SOLN: 25.0000 ug | INTRAMUSCULAR | Status: DC | PRN

## 2021-12-11 MED ORDER — KETOROLAC TROMETHAMINE 30 MG/ML IJ SOLN: 30.0000 mg | Freq: Four times a day (QID) | INTRAMUSCULAR | Status: AC | PRN

## 2021-12-11 MED ORDER — PRENATAL MULTIVITAMIN CH
1.0000 | ORAL_TABLET | Freq: Every day | ORAL | Status: DC
  Administered 2021-12-12 – 2021-12-14 (×3): 1 via ORAL
  Filled 2021-12-11 (×3): qty 1

## 2021-12-11 MED ORDER — STERILE WATER FOR IRRIGATION IR SOLN
Status: DC | PRN
  Administered 2021-12-11: 1

## 2021-12-11 MED ORDER — FENTANYL CITRATE (PF) 100 MCG/2ML IJ SOLN
INTRAMUSCULAR | Status: AC
  Filled 2021-12-11: qty 2

## 2021-12-11 MED ORDER — SCOPOLAMINE 1 MG/3DAYS TD PT72
1.0000 | MEDICATED_PATCH | Freq: Once | TRANSDERMAL | Status: DC
  Administered 2021-12-11: 1.5 mg via TRANSDERMAL
  Filled 2021-12-11: qty 1

## 2021-12-11 MED ORDER — SODIUM CHLORIDE 0.9 % IV SOLN: INTRAVENOUS | Status: DC | PRN

## 2021-12-11 MED ORDER — LACTATED RINGERS IV BOLUS
1000.0000 mL | Freq: Once | INTRAVENOUS | Status: AC
  Administered 2021-12-11: 1000 mL via INTRAVENOUS

## 2021-12-11 MED ORDER — SENNOSIDES-DOCUSATE SODIUM 8.6-50 MG PO TABS
2.0000 | ORAL_TABLET | Freq: Every day | ORAL | Status: DC
  Administered 2021-12-12 – 2021-12-14 (×3): 2 via ORAL
  Filled 2021-12-11 (×3): qty 2

## 2021-12-11 MED ORDER — KETOROLAC TROMETHAMINE 30 MG/ML IJ SOLN
INTRAMUSCULAR | Status: AC
  Filled 2021-12-11: qty 1

## 2021-12-11 MED ORDER — SOD CITRATE-CITRIC ACID 500-334 MG/5ML PO SOLN
30.0000 mL | Freq: Once | ORAL | Status: AC
  Administered 2021-12-11: 30 mL via ORAL
  Filled 2021-12-11: qty 30

## 2021-12-11 MED ORDER — BUPIVACAINE IN DEXTROSE 0.75-8.25 % IT SOLN
INTRATHECAL | Status: DC | PRN
  Administered 2021-12-11: 1.6 mL via INTRATHECAL

## 2021-12-11 MED ORDER — LEVOTHYROXINE SODIUM 50 MCG PO TABS
50.0000 ug | ORAL_TABLET | Freq: Every day | ORAL | Status: DC
  Administered 2021-12-12 – 2021-12-14 (×3): 50 ug via ORAL
  Filled 2021-12-11 (×3): qty 1

## 2021-12-11 MED ORDER — POLYETHYLENE GLYCOL 3350 17 G PO PACK
17.0000 g | PACK | Freq: Two times a day (BID) | ORAL | Status: DC
  Administered 2021-12-12 – 2021-12-14 (×3): 17 g via ORAL
  Filled 2021-12-11 (×4): qty 1

## 2021-12-11 MED ORDER — MEPERIDINE HCL 25 MG/ML IJ SOLN: 6.2500 mg | INTRAMUSCULAR | Status: DC | PRN

## 2021-12-11 MED ORDER — NALOXONE HCL 4 MG/10ML IJ SOLN: 1.0000 ug/kg/h | INTRAVENOUS | Status: DC | PRN

## 2021-12-11 MED ORDER — DIPHENHYDRAMINE HCL 25 MG PO CAPS: 25.0000 mg | ORAL_CAPSULE | ORAL | Status: DC | PRN

## 2021-12-11 MED ORDER — OXYCODONE HCL 5 MG/5ML PO SOLN: 5.0000 mg | Freq: Once | ORAL | Status: DC | PRN

## 2021-12-11 MED ORDER — LABETALOL HCL 5 MG/ML IV SOLN
20.0000 mg | Freq: Once | INTRAVENOUS | Status: AC
  Administered 2021-12-11: 20 mg via INTRAVENOUS
  Filled 2021-12-11: qty 4

## 2021-12-11 MED ORDER — OXYTOCIN-SODIUM CHLORIDE 30-0.9 UT/500ML-% IV SOLN
INTRAVENOUS | Status: DC | PRN
  Administered 2021-12-11: 30 mL via INTRAVENOUS

## 2021-12-11 MED ORDER — WITCH HAZEL-GLYCERIN EX PADS: 1.0000 "application " | MEDICATED_PAD | CUTANEOUS | Status: DC | PRN

## 2021-12-11 MED ORDER — LACTATED RINGERS IV SOLN: INTRAVENOUS | Status: DC

## 2021-12-11 MED ORDER — TETANUS-DIPHTH-ACELL PERTUSSIS 5-2.5-18.5 LF-MCG/0.5 IM SUSY: 0.5000 mL | PREFILLED_SYRINGE | Freq: Once | INTRAMUSCULAR | Status: DC

## 2021-12-11 MED ORDER — LACTATED RINGERS IV SOLN: INTRAVENOUS | Status: DC | PRN

## 2021-12-11 MED ORDER — ONDANSETRON HCL 4 MG/2ML IJ SOLN
4.0000 mg | Freq: Three times a day (TID) | INTRAMUSCULAR | Status: DC | PRN
  Administered 2021-12-12: 4 mg via INTRAVENOUS
  Filled 2021-12-11: qty 2

## 2021-12-11 MED ORDER — ZOLPIDEM TARTRATE 5 MG PO TABS: 5.0000 mg | ORAL_TABLET | Freq: Every evening | ORAL | Status: DC | PRN

## 2021-12-11 MED ORDER — DIBUCAINE (PERIANAL) 1 % EX OINT: 1.0000 "application " | TOPICAL_OINTMENT | CUTANEOUS | Status: DC | PRN

## 2021-12-11 MED ORDER — NALOXONE HCL 0.4 MG/ML IJ SOLN: 0.4000 mg | INTRAMUSCULAR | Status: DC | PRN

## 2021-12-11 MED ORDER — MENTHOL 3 MG MT LOZG
1.0000 | LOZENGE | OROMUCOSAL | Status: DC | PRN
Start: 2021-12-11 — End: 2021-12-14

## 2021-12-11 MED ORDER — DEXAMETHASONE SODIUM PHOSPHATE 4 MG/ML IJ SOLN
INTRAMUSCULAR | Status: AC
  Filled 2021-12-11: qty 2

## 2021-12-11 MED ORDER — AMLODIPINE BESYLATE 5 MG PO TABS
10.0000 mg | ORAL_TABLET | Freq: Every day | ORAL | Status: DC
Start: 2021-12-12 — End: 2021-12-14
  Administered 2021-12-12 – 2021-12-14 (×3): 10 mg via ORAL
  Filled 2021-12-11 (×3): qty 2

## 2021-12-11 MED ORDER — DEXAMETHASONE SODIUM PHOSPHATE 4 MG/ML IJ SOLN
INTRAMUSCULAR | Status: DC | PRN
  Administered 2021-12-11: 4 mg via INTRAVENOUS

## 2021-12-11 MED ORDER — DEXTROSE 5 % IN LACTATED RINGERS IV BOLUS
500.0000 mL | Freq: Once | INTRAVENOUS | Status: AC
  Administered 2021-12-11: 500 mL via INTRAVENOUS

## 2021-12-11 MED ORDER — DEXTROSE 5 % IV BOLUS
500.0000 mL | Freq: Once | INTRAVENOUS | Status: DC
Start: 2021-12-11 — End: 2021-12-11

## 2021-12-11 MED ORDER — CEFAZOLIN SODIUM-DEXTROSE 2-4 GM/100ML-% IV SOLN
2.0000 g | INTRAVENOUS | Status: AC
  Administered 2021-12-11: 2 g via INTRAVENOUS
  Filled 2021-12-11: qty 100

## 2021-12-11 MED ORDER — SODIUM CHLORIDE 0.9 % IR SOLN
Status: DC | PRN
  Administered 2021-12-11: 1

## 2021-12-11 MED ORDER — METHYLERGONOVINE MALEATE 0.2 MG PO TABS: 0.2000 mg | ORAL_TABLET | ORAL | Status: DC | PRN

## 2021-12-11 MED ORDER — NIFEDIPINE ER OSMOTIC RELEASE 30 MG PO TB24
30.0000 mg | ORAL_TABLET | Freq: Once | ORAL | Status: DC
Start: 2021-12-12 — End: 2021-12-12

## 2021-12-11 MED ORDER — OXYCODONE HCL 5 MG PO TABS: 5.0000 mg | ORAL_TABLET | Freq: Once | ORAL | Status: DC | PRN

## 2021-12-11 MED ORDER — OXYTOCIN-SODIUM CHLORIDE 30-0.9 UT/500ML-% IV SOLN
2.5000 [IU]/h | INTRAVENOUS | Status: AC
  Administered 2021-12-12: 2.5 [IU]/h via INTRAVENOUS
  Filled 2021-12-11 (×2): qty 500

## 2021-12-11 MED ORDER — DIPHENHYDRAMINE HCL 25 MG PO CAPS: 25.0000 mg | ORAL_CAPSULE | Freq: Four times a day (QID) | ORAL | Status: DC | PRN

## 2021-12-11 MED ORDER — METHYLERGONOVINE MALEATE 0.2 MG/ML IJ SOLN: 0.2000 mg | INTRAMUSCULAR | Status: DC | PRN

## 2021-12-11 MED ORDER — PHENYLEPHRINE 80 MCG/ML (10ML) SYRINGE FOR IV PUSH (FOR BLOOD PRESSURE SUPPORT)
PREFILLED_SYRINGE | INTRAVENOUS | Status: AC
  Filled 2021-12-11: qty 10

## 2021-12-11 MED ORDER — OXYCODONE HCL 5 MG PO TABS
5.0000 mg | ORAL_TABLET | ORAL | Status: DC | PRN
  Administered 2021-12-13: 5 mg via ORAL
  Administered 2021-12-13: 10 mg via ORAL
  Filled 2021-12-11: qty 1
  Filled 2021-12-11: qty 2

## 2021-12-11 MED ORDER — MORPHINE SULFATE (PF) 0.5 MG/ML IJ SOLN
INTRAMUSCULAR | Status: DC | PRN
  Administered 2021-12-11: 3 mg via EPIDURAL

## 2021-12-11 MED ORDER — KETOROLAC TROMETHAMINE 30 MG/ML IJ SOLN
30.0000 mg | Freq: Four times a day (QID) | INTRAMUSCULAR | Status: AC | PRN
  Administered 2021-12-11: 30 mg via INTRAVENOUS

## 2021-12-11 MED ORDER — ACETAMINOPHEN 500 MG PO TABS
1000.0000 mg | ORAL_TABLET | Freq: Four times a day (QID) | ORAL | Status: AC
  Administered 2021-12-12: 1000 mg via ORAL
  Filled 2021-12-11 (×2): qty 2

## 2021-12-11 MED ORDER — ONDANSETRON HCL 4 MG/2ML IJ SOLN
INTRAMUSCULAR | Status: AC
  Filled 2021-12-11: qty 2

## 2021-12-11 MED ORDER — METOCLOPRAMIDE HCL 5 MG/ML IJ SOLN
INTRAMUSCULAR | Status: AC
  Filled 2021-12-11: qty 2

## 2021-12-11 MED ORDER — SODIUM CHLORIDE 0.9% FLUSH: 3.0000 mL | INTRAVENOUS | Status: DC | PRN

## 2021-12-11 MED ORDER — SIMETHICONE 80 MG PO CHEW
80.0000 mg | CHEWABLE_TABLET | Freq: Three times a day (TID) | ORAL | Status: DC
  Administered 2021-12-12 – 2021-12-14 (×6): 80 mg via ORAL
  Filled 2021-12-11 (×7): qty 1

## 2021-12-11 MED ORDER — PHENYLEPHRINE HCL-NACL 20-0.9 MG/250ML-% IV SOLN
INTRAVENOUS | Status: DC | PRN
  Administered 2021-12-11: 60 ug/min via INTRAVENOUS

## 2021-12-11 MED ORDER — ONDANSETRON HCL 4 MG/2ML IJ SOLN
INTRAMUSCULAR | Status: DC | PRN
  Administered 2021-12-11: 4 mg via INTRAVENOUS

## 2021-12-11 MED ORDER — DIPHENHYDRAMINE HCL 50 MG/ML IJ SOLN: 12.5000 mg | INTRAMUSCULAR | Status: DC | PRN

## 2021-12-11 MED ORDER — ENOXAPARIN SODIUM 40 MG/0.4ML IJ SOSY
40.0000 mg | PREFILLED_SYRINGE | INTRAMUSCULAR | Status: DC
  Administered 2021-12-12 – 2021-12-14 (×3): 40 mg via SUBCUTANEOUS
  Filled 2021-12-11 (×3): qty 0.4

## 2021-12-11 MED ORDER — HALOPERIDOL 1 MG PO TABS
2.0000 mg | ORAL_TABLET | Freq: Every day | ORAL | Status: DC
  Administered 2021-12-13 (×2): 2 mg via ORAL
  Filled 2021-12-11 (×5): qty 2

## 2021-12-11 SURGICAL SUPPLY — 33 items
BENZOIN TINCTURE PRP APPL 2/3 (GAUZE/BANDAGES/DRESSINGS) ×2 IMPLANT
CHLORAPREP W/TINT 26ML (MISCELLANEOUS) ×4 IMPLANT
CLAMP CORD UMBIL (MISCELLANEOUS) ×2 IMPLANT
CLOTH BEACON ORANGE TIMEOUT ST (SAFETY) ×2 IMPLANT
DERMABOND ADVANCED (GAUZE/BANDAGES/DRESSINGS) ×2
DERMABOND ADVANCED .7 DNX12 (GAUZE/BANDAGES/DRESSINGS) IMPLANT
DRSG OPSITE POSTOP 4X10 (GAUZE/BANDAGES/DRESSINGS) ×2 IMPLANT
ELECT REM PT RETURN 9FT ADLT (ELECTROSURGICAL) ×2
ELECTRODE REM PT RTRN 9FT ADLT (ELECTROSURGICAL) ×1 IMPLANT
EXTRACTOR VACUUM M CUP 4 TUBE (SUCTIONS) IMPLANT
GLOVE BIOGEL PI IND STRL 7.0 (GLOVE) ×2 IMPLANT
GLOVE BIOGEL PI IND STRL 7.5 (GLOVE) ×2 IMPLANT
GLOVE BIOGEL PI INDICATOR 7.0 (GLOVE) ×2
GLOVE BIOGEL PI INDICATOR 7.5 (GLOVE) ×2
GLOVE ECLIPSE 7.5 STRL STRAW (GLOVE) ×2 IMPLANT
GOWN STRL REUS W/TWL LRG LVL3 (GOWN DISPOSABLE) ×6 IMPLANT
KIT ABG SYR 3ML LUER SLIP (SYRINGE) IMPLANT
NDL HYPO 25X5/8 SAFETYGLIDE (NEEDLE) IMPLANT
NEEDLE HYPO 25X5/8 SAFETYGLIDE (NEEDLE) IMPLANT
NS IRRIG 1000ML POUR BTL (IV SOLUTION) ×2 IMPLANT
PACK C SECTION WH (CUSTOM PROCEDURE TRAY) ×2 IMPLANT
PAD OB MATERNITY 4.3X12.25 (PERSONAL CARE ITEMS) ×2 IMPLANT
RTRCTR C-SECT PINK 25CM LRG (MISCELLANEOUS) ×2 IMPLANT
STRIP CLOSURE SKIN 1/2X4 (GAUZE/BANDAGES/DRESSINGS) ×2 IMPLANT
SUT PLAIN 0 NONE (SUTURE) ×2 IMPLANT
SUT VIC AB 0 CT1 36 (SUTURE) ×6 IMPLANT
SUT VIC AB 2-0 CT1 27 (SUTURE) ×1
SUT VIC AB 2-0 CT1 TAPERPNT 27 (SUTURE) ×1 IMPLANT
SUT VIC AB 4-0 KS 27 (SUTURE) ×2 IMPLANT
SYR 3ML 25GX5/8 SAFETY (SYRINGE) ×1 IMPLANT
TOWEL OR 17X24 6PK STRL BLUE (TOWEL DISPOSABLE) ×2 IMPLANT
TRAY FOLEY W/BAG SLVR 14FR LF (SET/KITS/TRAYS/PACK) ×2 IMPLANT
WATER STERILE IRR 1000ML POUR (IV SOLUTION) ×2 IMPLANT

## 2021-12-11 NOTE — Telephone Encounter (Signed)
Pt called and stated she did not have transportation today and was told at the doctor's office just to arrive at 0900 on day of surgery.

## 2021-12-11 NOTE — Anesthesia Postprocedure Evaluation (Signed)
Anesthesia Post Note  Patient: Theresa Mccoy  Procedure(s) Performed: [place] SAVER     Patient location during evaluation: PACU Anesthesia Type: Spinal Level of consciousness: oriented and awake and alert Pain management: pain level controlled Vital Signs Assessment: post-procedure vital signs reviewed and stable Respiratory status: spontaneous breathing and respiratory function stable Cardiovascular status: blood pressure returned to baseline and stable Postop Assessment: no headache, no backache, no apparent nausea or vomiting and able to ambulate Anesthetic complications: no   No notable events documented.  Last Vitals:  Vitals:   12/11/21 2200 12/11/21 2215  BP: (!) 141/85 (!) 161/101  Pulse: 61 (!) 53  Resp: 17 17  Temp:  36.5 C  SpO2: 96% 100%    Last Pain:  Vitals:   12/11/21 2214  TempSrc:   PainSc: 4    Pain Goal:    LLE Motor Response: Purposeful movement (12/11/21 2145) LLE Sensation: Tingling (12/11/21 2145) RLE Motor Response: Purposeful movement (12/11/21 2145) RLE Sensation: Tingling (12/11/21 2145) L Sensory Level: T10-Umbilical region (22/97/98 2145) R Sensory Level: T10-Umbilical region (92/11/94 2145) Epidural/Spinal Function Cutaneous sensation: Tingles (12/11/21 2145), Patient able to flex knees: Yes (12/11/21 2145), Patient able to lift hips off bed: No (12/11/21 2145), Back pain beyond tenderness at insertion site: No (12/11/21 2145), Progressively worsening motor and/or sensory loss: No (12/11/21 2145), Bowel and/or bladder incontinence post epidural: No (12/11/21 2145)  Merlinda Frederick

## 2021-12-11 NOTE — Procedures (Signed)
Nirel Babler 1981-05-27 [redacted]w[redacted]d Fetus A Non-Stress Test Interpretation for 12/11/21  Indication: Gestational Diabetes medication controlled  Fetal Heart Rate A Mode: External Baseline Rate (A): 140 bpm Variability: Minimal Accelerations: 15 x 15 Decelerations: Prolonged (decel to the 80's for four min before return to baseline) Multiple birth?: No  Uterine Activity Mode: Toco Contraction Frequency (min): one u/c that pt rated a "9" when monitor was applied Contraction Duration (sec): unable to determine, more than 90 sec Contraction Quality: Moderate Resting Tone Palpated: Relaxed  Interpretation (Fetal Testing) Overall Impression: Non-reassuring (reassuring toward the end of the NST) Comments: Turned pt to left side,at 1604, Dr. BGertie Exonnotifed at that time, he was at bedside at 1605, [facility].

## 2021-12-11 NOTE — Anesthesia Procedure Notes (Signed)
Epidural Patient location during procedure: OB Start time: 12/11/2021 7:50 PM End time: 12/11/2021 7:55 PM  Staffing Anesthesiologist: Merlinda Frederick, MD Performed: anesthesiologist   Preanesthetic Checklist Completed: patient identified, IV checked, risks and benefits discussed, monitors and equipment checked, pre-op evaluation and timeout performed  Epidural Patient position: sitting Prep: DuraPrep Patient monitoring: heart rate, continuous pulse ox and blood pressure Approach: midline Location: L3-L4 Injection technique: LOR air  Needle:  Needle type: Tuohy  Needle gauge: 17 G Needle length: 9 cm Needle insertion depth: 5 cm Catheter type: closed end flexible Catheter size: 19 Gauge Catheter at skin depth: 10 cm  Assessment Events: blood not aspirated, injection not painful, no injection resistance, no paresthesia and negative IV test  Additional Notes Reason for block:procedure for pain

## 2021-12-11 NOTE — Progress Notes (Signed)
MFM Brief Note  [name] is here for antenatal testing given the above indications. She is seen at the request of Dr Mora Bellman.  Antenatal testing performed given maternal Advanced maternal age, Hypothyroidism, GDM and bipolar disease. The biophysical profile was 6/8 with good fetal movement and amniotic fluid volume.  An NST was performed which a 4 minute prolonged deceleration was observed. The fetus recovered with moderater variability and accelerations.  Given this we called EMS and she was taken to L&D for delivery given her term gestation.  I discussed this plan of care with Dr. Si Raider who was in agreement.  I spent 20 minutes with > 50%  in direct communication and care coordination.  Theresa Ports, MD.

## 2021-12-11 NOTE — MAU Note (Signed)
...  Theresa Mccoy is a 41 y.o. at 60w3dhere in MAU reporting: [facility] and a deceleration with FHR in the 80's for 4 minutes. Denies VB or LOF. Has not felt any fetal movement since 1500.  FHT: 155 initial external

## 2021-12-11 NOTE — Transfer of Care (Signed)
Immediate Anesthesia Transfer of Care Note  Patient: Theresa Mccoy  Procedure(s) Performed: CESAREAN SECTION APPLICATION OF CELL SAVER  Patient Location: PACU  Anesthesia Type:Spinal  Level of Consciousness: awake, alert  and oriented  Airway & Oxygen Therapy: Patient Spontanous Breathing  Post-op Assessment: Report given to RN and Post -op Vital signs reviewed and stable  Post vital signs: Reviewed and stable  Last Vitals:  Vitals Value Taken Time  BP 123/76 12/11/21 2100  Temp 36.4 C 12/11/21 2100  Pulse 57 12/11/21 2111  Resp 14 12/11/21 2111  SpO2 98 % 12/11/21 2111  Vitals shown include unvalidated device data.  Last Pain:  Vitals:   12/11/21 2100  TempSrc: Oral  PainSc: 0-No pain         Complications: No notable events documented.

## 2021-12-11 NOTE — Op Note (Signed)
Preoperative diagnosis:  1.  Intrauterine pregnancy at 54w3dweeks gestation                                         2.  Previous C section x 2                                         3.  Breech presentation                                         4.  History of SBO with SBR                                          5.  Right ureteral calculus with obstruction                                          6.  CHTN                                          7.  A1DM   Postoperative diagnosis:  Same as above   Procedure:  Repeatcesarean section  Surgeon:  LFlorian BuffMD  Assistant:  SDarrelyn HillockMD  Anesthesia: Spinal  Findings:  .    Over a low transverse incision was delivered a viable female with Apgars of 7 and 8 weighing pending lbs.  oz. Uterus, tubes and ovaries were all normal.  There were no other significant findings.  I didn't go looking for upper adhesions so as not to cause any issues  Description of operation:  Patient was taken to the operating room and placed in the sitting position where she underwent a spinal anesthetic. She was then placed in the supine position with tilt to the left side. When adequate anesthetic level was obtained she was prepped and draped in usual sterile fashion and a Foley catheter was placed. A Pfannenstiel skin incision was made and carried down sharply to the rectus fascia which was scored in the midline extended laterally. The fascia was taken off the muscles both superiorly and without difficulty. The muscles were divided.  The peritoneal cavity was entered.  Bladder blade was placed, no bladder flap was created.  A low transverse hysterotomy incision was made and delivered a viable female  infant at 259with Apgars of 7 and 8 weighingpending lbs  oz.  Cord pH was obtained and was pending. The uterus was exteriorized. It was closed in 2 layers, the first being a running interlocking layer and the second being an imbricating layer using 0 monocryl on a CTX  needle. There was good resulting hemostasis. The uterus tubes and ovaries were all normal. Peritoneal cavity was irrigated vigorously. The muscles and peritoneum were reapproximated loosely. The fascia was closed using 0 Vicryl in running fashion. Subcutaneous tissue was made hemostatic and irrigated. The skin was closed using 4-0  Vicryl on a Keith needle in a subcuticular fashion.  Dermabond was placed for additional wound integrity and to serve as a barrier. Blood loss for the procedure was 179cc. The patient received 2 gram of Ancef prophylactically. The patient was taken to the recovery room in good stable condition with all counts being correct x3.  EBL 179 cc  Florian Buff 12/11/2021 8:42 PM

## 2021-12-11 NOTE — H&P (Signed)
Preoperative History and Physical  Theresa Mccoy is a 41 y.o. [id number] with No LMP recorded (approximate). Patient is pregnant. admitted for a repeat Caesarean section.  Significant history: She is currently monitored for the following issues for this high-risk pregnancy and has Bipolar disorder, current episode mixed, moderate ([place]); Generalized anxiety disorder; UTI in pregnancy; History of small bowel obstruction; Hypercalcemia; Primary hyperparathyroidism ([place]); AKI (acute kidney injury) ([place]); Renal calculus, right; Severe bipolar I disorder, current or most recent episode depressed ([place]); Supervision of high risk pregnancy, antepartum; Previous cesarean delivery x 2 affecting pregnancy, antepartum; S/P small bowel resection; H/O unilateral salpingectomy; Gestational diabetes mellitus (GDM) affecting pregnancy, antepartum; Pelvic adhesive disease; Chronic hypertension affecting pregnancy; Acquired hypothyroidism; Hypoglycemia associated with gestational diabetes ([place]); Anemia in pregnancy, second trimester; Assistance needed with transportation; Unwanted fertility; Pain, dental; and AMA (advanced maternal age) multigravida 77+, third trimester on their problem list  Most importantly SBO/SBR  PMH:    Past Medical History:  Diagnosis Date   AKI (acute kidney injury) ([place]) 01/22/2020   Anemia    Depression    Diabetes mellitus without complication ([place])    Essential hypertension 02/02/2017   Gestational diabetes    Hypernatremia 01/22/2020   Hyperthyroidism    Hypothyroidism    PTSD (post-traumatic stress disorder)    SBO (small bowel obstruction) ([place]) 07/07/2019   Sepsis ([place]) 01/22/2020    PSH:     Past Surgical History:  Procedure Laterality Date   BOWEL RESECTION N/A 07/08/2019   Procedure: Small Bowel Resection;  Surgeon: Clovis Riley, MD;  Location: [facility];  Service: General;  Laterality: N/A;   CESAREAN SECTION     DILATION AND CURETTAGE OF UTERUS     HAND SURGERY  Left    IR NEPHROSTOMY EXCHANGE RIGHT  10/25/2021   IR NEPHROSTOMY PLACEMENT RIGHT  09/27/2021   LAPAROTOMY N/A 07/08/2019   Procedure: EXPLORATORY LAPAROTOMY;  Surgeon: Clovis Riley, MD;  Location: [facility];  Service: General;  Laterality: N/A;   OOPHORECTOMY Right    PARATHYROIDECTOMY Right 09/13/2021   Procedure: RIGHT INFERIOR PARATHYROIDECTOMY;  Surgeon: Armandina Gemma, MD;  Location: [facility];  Service: General;  Laterality: Right;   SALPINGECTOMY      POb/GynH:      OB History     Gravida  12   Para  2   Term  2   Preterm      AB  9   Living  2      SAB  8   IAB      Ectopic  1   Multiple      Live Births  2           SH:   Social History   Tobacco Use   Smoking status: Some Days    Packs/day: 0.25    Types: Cigarettes   Smokeless tobacco: Never  Vaping Use   Vaping Use: Never used  Substance Use Topics   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  Review of Systems:   Review of Systems  Constitutional: Negative for fever, chills, weight loss, malaise/fatigue and diaphoresis.  HENT: Negative for hearing loss, ear pain, nosebleeds, congestion, sore throat, neck pain, tinnitus and ear discharge.   Eyes: Negative for blurred vision, double vision, photophobia, pain, discharge and redness.  Respiratory: Negative for cough, hemoptysis, sputum production, shortness of breath, wheezing and stridor.   Cardiovascular: Negative for chest pain, palpitations, orthopnea, claudication, leg swelling and PND.  Gastrointestinal: Positive for abdominal pain. Negative for  heartburn, nausea, vomiting, diarrhea, constipation, blood in stool and melena.  Genitourinary: Negative for dysuria, urgency, frequency, hematuria and flank pain.  Musculoskeletal: Negative for myalgias, back pain, joint pain and falls.  Skin: Negative for itching and rash.  Neurological: Negative for dizziness, tingling, tremors, sensory change, speech change, focal weakness, seizures, loss of consciousness, weakness and headaches.  Endo/Heme/Allergies: Negative for environmental allergies and polydipsia. Does not bruise/bleed easily.  Psychiatric/Behavioral: Negative for depression, suicidal ideas, hallucinations, memory loss and substance abuse. The patient is not nervous/anxious and does not have insomnia.      PHYSICAL EXAM:  Blood pressure (!) 150/88, pulse 69, temperature 98.1 F (36.7 C), temperature source Oral, resp. rate 17, SpO2 100 %.    Vitals reviewed. Constitutional: She is oriented to person, place, and time. She appears well-developed and well-nourished.  HENT:  Head: Normocephalic and atraumatic.  Right Ear: External ear normal.  Left Ear: External ear normal.  Nose: Nose normal.  Mouth/Throat: Oropharynx is clear and moist.  Eyes: Conjunctivae and EOM are normal. Pupils are equal, round, and reactive to light. Right eye exhibits no discharge. Left eye exhibits no discharge. No scleral icterus.  Neck: Normal range of motion. Neck supple. No tracheal deviation present. No thyromegaly present.  Cardiovascular: Normal rate, regular rhythm, normal heart sounds and intact distal pulses.  Exam reveals no gallop and no friction rub.   No murmur heard. Respiratory: Effort normal and breath sounds normal. No respiratory distress. She has no wheezes. She has no rales. She exhibits no tenderness.  GI: Soft. Bowel sounds are normal. She exhibits no distension and no mass. There is tenderness. There is no rebound and no guarding.  Genitourinary:       Vulva is normal without  lesions Vagina is pink moist without discharge Cervix normal in appearance and pap is normal Uterus is enlarged, 38 weeks size Adnexa is negative with normal sized ovaries by sonogram  Musculoskeletal: Normal range of motion. She exhibits no edema and no tenderness.  Neurological: She is alert and oriented to person, place, and time. She has normal reflexes. She displays normal reflexes. No cranial nerve deficit. She exhibits normal muscle tone. Coordination normal.  Skin: Skin is warm and dry. No rash noted. No erythema. No pallor.  Psychiatric: She has a normal mood and affect. Her behavior is normal. Judgment and thought content normal.    Labs: Results for orders placed or performed during the hospital encounter of 12/09/21 (from the past 336 hour(s))  CBC   Collection Time: 12/09/21  3:34 PM  Result Value Ref Range   WBC 12.0 (H) 4.0 - 10.5 K/uL   RBC 3.68 (L) 3.87 - 5.11 MIL/uL   Hemoglobin 11.5 (L) 12.0 - 15.0 g/dL   HCT 34.9 (L) 36.0 - 46.0 %   MCV 94.8 80.0 - 100.0 fL   MCH 31.3 26.0 - 34.0 pg   MCHC 33.0 30.0 - 36.0 g/dL   RDW 13.1 11.5 - 15.5 %  Platelets 262 150 - 400 K/uL   nRBC 0.0 0.0 - 0.2 %  Comprehensive metabolic panel   Collection Time: 12/09/21  3:34 PM  Result Value Ref Range   Sodium 138 135 - 145 mmol/L   Potassium 3.4 (L) 3.5 - 5.1 mmol/L   Chloride 111 98 - 111 mmol/L   CO2 20 (L) 22 - 32 mmol/L   Glucose, Bld 67 (L) 70 - 99 mg/dL   BUN 11 6 - 20 mg/dL   Creatinine, Ser 1.10 (H) 0.44 - 1.00 mg/dL   Calcium 8.5 (L) 8.9 - 10.3 mg/dL   Total Protein 6.7 6.5 - 8.1 g/dL   Albumin 2.7 (L) 3.5 - 5.0 g/dL   AST 17 15 - 41 U/L   ALT 13 0 - 44 U/L   Alkaline Phosphatase 196 (H) 38 - 126 U/L   Total Bilirubin 0.4 0.3 - 1.2 mg/dL   GFR, Estimated >60 >60 mL/min   Anion gap 7 5 - 15  Troponin I (High Sensitivity)   Collection Time: 12/09/21  3:34 PM  Result Value Ref Range   Troponin I (High Sensitivity) 11 <18 ng/L  Protein / creatinine ratio, urine    Collection Time: 12/09/21  3:45 PM  Result Value Ref Range   Creatinine, Urine 248.04 mg/dL   Total Protein, Urine 128 mg/dL   Protein Creatinine Ratio 0.52 (H) 0.00 - 0.15 mg/mg[Cre]  Culture, OB Urine   Collection Time: 12/09/21  4:13 PM   Specimen: OB Clean Catch; Urine  Result Value Ref Range   Specimen Description OB CLEAN CATCH    Special Requests Normal    Culture (A)     >=100,000 COLONIES/mL ESCHERICHIA COLI NO GROUP B STREP (S.AGALACTIAE) ISOLATED Performed at [facility], [address], [place], [place] [address]    Report Status 12/11/2021 FINAL    Organism ID, Bacteria ESCHERICHIA COLI (A)       Susceptibility   Escherichia coli - MIC*    AMPICILLIN 4 SENSITIVE Sensitive     CEFAZOLIN <=4 SENSITIVE Sensitive     CEFEPIME <=0.12 SENSITIVE Sensitive     CEFTAZIDIME <=1 SENSITIVE Sensitive     CEFTRIAXONE <=0.25 SENSITIVE Sensitive     CIPROFLOXACIN <=0.25 SENSITIVE Sensitive     GENTAMICIN <=1 SENSITIVE Sensitive     IMIPENEM <=0.25 SENSITIVE Sensitive     TRIMETH/SULFA >=320 RESISTANT Resistant     AMPICILLIN/SULBACTAM <=2 SENSITIVE Sensitive     PIP/TAZO <=4 SENSITIVE Sensitive     * >=100,000 COLONIES/mL ESCHERICHIA COLI  Urinalysis, Routine w reflex microscopic Urine, Clean Catch   Collection Time: 12/09/21  4:13 PM  Result Value Ref Range   Color, Urine AMBER (A) YELLOW   APPearance HAZY (A) CLEAR   Specific Gravity, Urine 1.025 1.005 - 1.030   pH 6.0 5.0 - 8.0   Glucose, UA NEGATIVE NEGATIVE mg/dL   Hgb urine dipstick NEGATIVE NEGATIVE   Bilirubin Urine NEGATIVE NEGATIVE   Ketones, ur NEGATIVE NEGATIVE mg/dL   Protein, ur 100 (A) NEGATIVE mg/dL   Nitrite POSITIVE (A) NEGATIVE   Leukocytes,Ua TRACE (A) NEGATIVE   RBC / HPF 0-5 0 - 5 RBC/hpf   WBC, UA 11-20 0 - 5 WBC/hpf   Bacteria, UA MANY (A) NONE SEEN   Squamous Epithelial / LPF 11-20 0 - 5   Non Squamous Epithelial 0-5 (A) NONE SEEN    EKG: Orders placed or performed during the hospital  encounter of 12/09/21   EKG 12-Lead  EKG 12-Lead   EKG    Imaging Studies: [place] MFM FETAL BPP W/NONSTRESS  Result Date: 12/11/2021 ----------------------------------------------------------------------  OBSTETRICS REPORT                       (Signed Final 12/11/2021 05:05 pm) ---------------------------------------------------------------------- Patient Info  ID #:       [id number]                          D.O.B.:  [birth year] (41 yrs)  Name:       Theresa Mccoy                  Visit Date: 12/11/2021 03:57 pm ---------------------------------------------------------------------- Performed By  Attending:        Sander Nephew      Secondary Phy.:   PEGGY                    MD                                                             CONSTANT MD  Performed By:     Eveline Keto         Address:          Faculty                    RDMS  Referred By:      [place]          Location[facility]                                [facility]                                                             [facility]                                                             [facility]  Ref. Address:     [address]                    [place], [place] ---------------------------------------------------------------------- Orders  #  Description                           Code        Ordered By  1  [place] MFM FETAL BPP  58850.2     Peterson Ao     W/NONSTRESS ----------------------------------------------------------------------  #  Order #                     Accession #                Episode #  1  [id number]                   [id number]                 [id number] ---------------------------------------------------------------------- Indications  [redacted] weeks gestation of pregnancy                Z3A.38  Gestational diabetes in pregnancy,             O24.415  controlled by oral hypoglycemic drugs  Advanced maternal age multigravida 44+,         O82.523  third trimester(41 yr)  Hypothyroid (Synthyroid)                       O99.280 E03.9  History of cesarean delivery, currently        O34.219  pregnant  Poor obstetric history-Recurrent (habitual)    O26.20  abortion (8 consecutive ab's)  Hypertension - Chronic/Pre-existing (No        O10.019  Meds)  Anemia during pregnancy in third trimester     T65.465  Medical complication of pregnancy (R           O26.90  Nephorlitiasis)  LR NIPS/Horizon-Neg ---------------------------------------------------------------------- Fetal Evaluation  Num Of Fetuses:         1  Fetal Heart Rate(bpm):  141  Cardiac Activity:       Observed  Presentation:           Breech  Amniotic Fluid  AFI FV:      Within normal limits  AFI Sum(cm)     %Tile       Largest Pocket(cm)  11.24           36          5.78  RUQ(cm)       RLQ(cm)       LUQ(cm)        LLQ(cm)  1.1           5.78          2.15           2.21 ---------------------------------------------------------------------- Biophysical Evaluation  Amniotic F.V:   Within normal limits       F. Tone:        Observed  F. Movement:    Observed                   Score:          6/8  F. Breathing:   Not Observed ---------------------------------------------------------------------- Biometry  LV:        5.3  mm ---------------------------------------------------------------------- Gestational Age  Best:          38w 3d     Det. By:  Previous Ultrasound      EDD:   12/22/21                                      (05/26/21) ---------------------------------------------------------------------- Anatomy  Cranium:  Appears normal         Aortic Arch:            Previously seen  Cavum:                 Appears normal         Ductal Arch:            Previously seen  Ventricles:            Appears normal         Diaphragm:              Appears normal  Choroid Plexus:        Previously seen        Stomach:                Appears normal, left                                                                         sided  Cerebellum:            Previously seen        Abdomen:                Appears normal  Posterior Fossa:       Previously seen        Abdominal Wall:         Previously seen  Nuchal Fold:           Not applicable (>16    Cord Vessels:           Previously seen                         wks GA)  Face:                  Orbits and profile     Kidneys:                Appear normal                         previously seen  Lips:                  Previously seen        Bladder:                Appears normal  Thoracic:              Previously seen        Spine:                  Previously seen  Heart:                 Appears normal         Upper Extremities:      Previously seen                         (4CH, axis, and                         situs)  RVOT:  Previously seen        Lower Extremities:      Previously seen  LVOT:                  Previously seen  Other:  Female gender previously seen. Nasal bone prev visualized. ---------------------------------------------------------------------- Cervix Uterus Adnexa  Cervix  Not visualized (advanced GA >24wks)  Uterus  No abnormality visualized.  Right Ovary  Not visualized.  Left Ovary  Not visualized. ---------------------------------------------------------------------- Impression  MFM Brief Note  Ms. Richoux is here for antenatal testing given the above  indications. She is seen at the request of Dr Mora Bellman.  Antenatal testing performed given maternal Advanced  maternal age, Hypothyroidism, A2GDM and bipolar disease.  The biophysical profile was 6/8 with good fetal movement and  amniotic fluid volume.  An NST was performed which a 4 minute prolonged  deceleration was observed. The fetus recovered with  moderater variability and accelerations.  Given this we called EMS and she was taken to L&D for  delivery given her term gestation.  I discussed this plan of care with Dr. Si Raider who was in  agreement.  I spent 20 minutes with  > 50%  in direct communication and  care coordination.  Vikki Ports, MD. ----------------------------------------------------------------------              Sander Nephew, MD Electronically Signed Final Report   12/11/2021 05:05 pm ----------------------------------------------------------------------  [place] MFM FETAL BPP WO NON STRESS  Result Date: 12/05/2021 ----------------------------------------------------------------------  OBSTETRICS REPORT                       (Signed Final 12/05/2021 04:33 pm) ---------------------------------------------------------------------- Patient Info  ID #:       [id number]                          D.O.B.:  Jun 03, 1981 (41 yrs)  Name:       Theresa Mccoy                  Visit Date: 12/05/2021 02:57 pm ---------------------------------------------------------------------- Performed By  Attending:        Johnell Comings MD         Secondary Phy.:   Vickii Chafe                                                             CONSTANT MD  Performed By:     Nathen May       Address:          Faculty                    RDMS  Referred By:      [place]          Location:         [facility]                                [facility] at  [facility]                                                             Women  Ref. Address:     [address]                    [place], [place] ---------------------------------------------------------------------- Orders  #  Description                           Code        Ordered By  1  [place] MFM FETAL BPP WO NON               [phone]    YU FANG     STRESS ----------------------------------------------------------------------  #  Order #                     Accession #                Episode #  1  [id number]                   [id number]                 [id number]  ---------------------------------------------------------------------- Indications  Gestational diabetes in pregnancy,             O24.415  controlled by oral hypoglycemic drugs  Advanced maternal age multigravida 12+,        O103.523  third trimester(41 yr)  Hypothyroid (Synthyroid)                       O99.280 E03.9  History of cesarean delivery, currently        O34.219  pregnant  Poor obstetric history-Recurrent (habitual)    O26.20  abortion (8 consecutive ab's)  Hypertension - Chronic/Pre-existing (No        O10.019  Meds)  Anemia during pregnancy in third trimester     T73.220  Medical complication of pregnancy (R           O26.90  Nephorlitiasis)  [redacted] weeks gestation of pregnancy                Z3A.37  LR NIPS/Horizon-Neg ---------------------------------------------------------------------- Fetal Evaluation  Num Of Fetuses:         1  Fetal Heart Rate(bpm):  150  Cardiac Activity:       Observed  Presentation:           Breech  Placenta:               Posterior Fundal  P. Cord Insertion:      Previously Visualized  Amniotic Fluid  AFI FV:      Within normal limits  AFI Sum(cm)     %Tile       Largest Pocket(cm)  10.06           25          5.11  RUQ(cm)       RLQ(cm)       LUQ(cm)        LLQ(cm)  1.01          5.11          1.94           2 ---------------------------------------------------------------------- Biophysical Evaluation  Amniotic F.V:   Pocket => 2 cm             F. Tone:        Observed  F. Movement:    Observed                   Score:          8/8  F. Breathing:   Observed ---------------------------------------------------------------------- Biometry  LV:        2.3  mm ---------------------------------------------------------------------- Gestational Age  Best:          37w 4d     Det. By:  Previous Ultrasound      EDD:   12/22/21                                      (05/26/21) ---------------------------------------------------------------------- Comments  This patient was seen for a BPP  due to advanced maternal  age, chronic hypertension, and gestational diabetes.  She  denies any problems since her last exam.  A biophysical profile performed today was 8 out of 8.  There was normal amniotic fluid noted on today's ultrasound  exam.  Her case was discussed during [facility] rounds today.  Due to concerns regarding adhesions and bowel obstruction,  her cesarean delivery will most likely be moved up to 38+  weeks during the weekday when general surgery will be  available on standby.  She will return in 1 week for another BPP. ----------------------------------------------------------------------                   Johnell Comings, MD Electronically Signed Final Report   12/05/2021 04:33 pm ----------------------------------------------------------------------  [place] MFM FETAL BPP WO NON STRESS  Result Date: 11/27/2021 ----------------------------------------------------------------------  OBSTETRICS REPORT                       (Signed Final 11/27/2021 03:46 pm) ---------------------------------------------------------------------- Patient Info  ID #:       [id number]                          D.O.B.:  08-13-1980 (41 yrs)  Name:       Theresa Mccoy                  Visit Date: 11/27/2021 02:48 pm ---------------------------------------------------------------------- Performed By  Attending:        Sander Nephew      Secondary Phy.:   PEGGY                    MD                                                             CONSTANT MD  Performed By:     Germain Osgood            Address:          Faculty  RDMS  Referred By:      [place]          Location:         [facility]                                [facility] at                                                             [facility]                                                             Women  Ref. Address:     [address]                    [place], [place]  ---------------------------------------------------------------------- Orders  #  Description                           Code        Ordered By  1  [place] MFM FETAL BPP WO NON               [id number]    YU FANG     STRESS  2  [place] MFM OB FOLLOW UP                   [id number]    YU FANG ----------------------------------------------------------------------  #  Order #                     Accession #                Episode #  1  [id number]                   [id number]                 [id number]  2  [id number]                   [id number]                 [id number] ---------------------------------------------------------------------- Indications  Gestational diabetes in pregnancy,             O24.415  controlled by oral hypoglycemic drugs  Advanced maternal age multigravida 28+,        O93.523  third trimester(41 yr)  Hypothyroid (Synthyroid)                       O99.280 E03.9  History of cesarean delivery, currently        O34.219  pregnant  Poor obstetric history-Recurrent (habitual)    O26.20  abortion (8 consecutive ab's)  Hypertension - Chronic/Pre-existing (  No        O10.019  Meds)  Anemia during pregnancy in third trimester     N82.956  Medical complication of pregnancy (R           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 Cord Insertion:      Visualized, central  Amniotic Fluid  AFI FV:      Within normal limits  AFI Sum(cm)     %Tile       Largest Pocket(cm)  8.55            12          4.27  RUQ(cm)       RLQ(cm)       LUQ(cm)        LLQ(cm)  1.93          0             4.27           2.35 ---------------------------------------------------------------------- Biophysical Evaluation  Amniotic F.V:   Within normal limits        F. Tone:        Observed  F. Movement:    Observed                   Score:          8/8  F. Breathing:   Observed ---------------------------------------------------------------------- Biometry  BPD:      89.7  mm     G. Age:  36w 2d         58  %    CI:        69.87   %    70 - 86                                                          FL/HC:      19.1   %    20.1 - 22.1  HC:      342.4  mm     G. Age:  39w 3d         29  %    HC/AC:      1.09        0.93 - 1.11  AC:      314.6  mm     G. Age:  35w 3d         31  %    FL/BPD:     73.0   %    71 - 87  FL:       65.5  mm     G. Age:  33w 5d        2.6  %    FL/AC:      20.8   %    20 - 24  LV:  4.3  mm  Est. FW:    2686  gm    5 lb 15 oz      28  % ---------------------------------------------------------------------- Gestational Age  U/S Today:     36w 2d                                        EDD:   12/23/21  Best:          36w 3d     Det. By:  Previous Ultrasound      EDD:   12/22/21                                      (05/26/21) ---------------------------------------------------------------------- Anatomy  Cranium:               Appears normal         LVOT:                   Previously seen  Cavum:                 Previously seen        Aortic Arch:            Previously seen  Ventricles:            Appears normal         Ductal Arch:            Previously seen  Choroid Plexus:        Previously seen        Diaphragm:              Appears normal  Cerebellum:            Previously seen        Stomach:                Appears normal, left                                                                        sided  Posterior Fossa:       Previously seen        Abdomen:                Appears normal  Nuchal Fold:           Not applicable (>99    Abdominal Wall:         Previously seen                         wks GA)  Face:                  Orbits and profile     Cord Vessels:           Previously seen                         previously seen  Lips:  Previously seen        Kidneys:                Appear normal  Palate:                Not well visualized    Bladder:                Appears normal  Thoracic:              Previously seen        Spine:                  Previously seen  Heart:                 Appears normal         Upper Extremities:      Previously seen                         (4CH, axis, and                         situs)  RVOT:                  Previously seen        Lower Extremities:      Previously seen  Other:  Female gender previously seen. Nasal bone prev visualized. ---------------------------------------------------------------------- Cervix Uterus Adnexa  Cervix  Not visualized (advanced GA >24wks) ---------------------------------------------------------------------- Impression  Follow up growth due to A2GDM, AMA, and hypothyroidism  Normal interval growth with measurements consistent with  dates  Good fetal movement and amniotic fluid volume  Biophysical profile 8/8 ---------------------------------------------------------------------- Recommendations  Continue weekly testing with plan for delivery between 37-39  pending maternal and fetal status. ----------------------------------------------------------------------               Sander Nephew, MD Electronically Signed Final Report   11/27/2021 03:46 pm ----------------------------------------------------------------------  [place] MFM FETAL BPP WO NON STRESS  Result Date: 11/14/2021 ----------------------------------------------------------------------  OBSTETRICS REPORT                       (Signed Final 11/14/2021 05:11 pm) ---------------------------------------------------------------------- Patient Info  ID #:       [id number]                          D.O.B.:  1980-12-11 (41 yrs)  Name:       Theresa Mccoy                  Visit Date: 11/14/2021 02:31 pm ---------------------------------------------------------------------- Performed By  Attending:        Johnell Comings  MD         Secondary Phy.:   Vickii Chafe                                                             CONSTANT MD  Performed By:     Germain [place]            Address:          Faculty  RDMS  Referred By:      [place]          Location:         [facility]                                [facility]                                                             [facility]                                                             [facility]  Ref. Address:     [address]                    [place], [place] ---------------------------------------------------------------------- Orders  #  Description                           Code        Ordered By  1  [place] MFM FETAL BPP WO NON               [phone]    YU FANG     STRESS ----------------------------------------------------------------------  #  Order #                     Accession #                Episode #  1  [id number]                   [id number]                 [id number] ---------------------------------------------------------------------- Indications  Gestational diabetes in pregnancy,             O24.415  controlled by oral hypoglycemic drugs  Advanced maternal age multigravida 69+,        O80.523  third trimester(41 yr)  Hypothyroid (Synthyroid)                       O99.280 E03.9  History of cesarean delivery, currently        O34.219  pregnant  Poor obstetric history-Recurrent (habitual)    O26.20  abortion (8 consecutive ab's)  Hypertension - Chronic/Pre-existing (No        O10.019  Meds)  Anemia during pregnancy in third trimester     H41.937  Medical complication of pregnancy (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 Cord Insertion:      Previously Visualized  Amniotic Fluid  AFI FV:      Within normal limits  AFI Sum(cm)     %Tile       Largest Pocket(cm)  15.6            56          6.1  RUQ(cm)       RLQ(cm)       LUQ(cm)        LLQ(cm)  3             5.2           6.1            1.3 ---------------------------------------------------------------------- Biophysical Evaluation  Amniotic F.V:   Within normal limits       F. Tone:        Observed  F. Movement:    Observed                   Score:          8/8  F. Breathing:   Observed ---------------------------------------------------------------------- Gestational Age  Best:          34w 4d     Det. By:  Previous Ultrasound      EDD:   12/22/21                                      (05/26/21) ---------------------------------------------------------------------- Anatomy  Ventricles:            Appears normal         Kidneys:                Appear normal  Diaphragm:             Appears normal         Bladder:                Appears normal  Stomach:               Appears normal, left                         sided ---------------------------------------------------------------------- Comments  This patient was seen for a BPP due to advanced maternal  age, chronic hypertension, gestational diabetes treated with  metformin, and hypothyroidism.  She was informed that the  fetal growth and amniotic fluid level appears appropriate for  her gestational age.  A BPP performed today was 8 out of 8.  There was normal amniotic fluid noted.  She will return in 1 week for another BPP. ----------------------------------------------------------------------                   Johnell Comings, MD Electronically Signed Final Report   11/14/2021 05:11 pm ----------------------------------------------------------------------  [place] MFM OB FOLLOW UP  Result Date: 11/27/2021 ----------------------------------------------------------------------  OBSTETRICS  REPORT                       (Signed Final 11/27/2021 03:46  pm) ---------------------------------------------------------------------- Patient Info  ID #:       [id number]                          D.O.B.:  01-18-1981 (41 yrs)  Name:       Theresa Mccoy                  Visit Date: 11/27/2021 02:48 pm ---------------------------------------------------------------------- Performed By  Attending:        Sander Nephew      Secondary Phy.:   PEGGY                    MD                                                             CONSTANT MD  Performed By:     Germain [place]            Address:          Faculty                    RDMS  Referred By:      [place]          Location:         [facility]                                [facility] at                                                             [facility]                                                             [facility]  Ref. Address:     [address]                    [place], [place] ---------------------------------------------------------------------- Orders  #  Description                           Code        Ordered By  1  [place] MFM FETAL BPP WO NON               [id number]    YU FANG     STRESS  2  [place] MFM OB FOLLOW UP                   [id number]    YU  FANG ----------------------------------------------------------------------  #  Order #                     Accession #                Episode #  1  [id number]                   [id number]                 [id number]  2  [id number]                   [id number]                 [id number] ---------------------------------------------------------------------- Indications  Gestational diabetes in pregnancy,             O24.415  controlled by oral hypoglycemic drugs  Advanced maternal age multigravida 41+,        O44.523  third trimester(41 yr)  Hypothyroid (Synthyroid)                       O99.280 E03.9  History of cesarean delivery, currently         O34.219  pregnant  Poor obstetric history-Recurrent (habitual)    O26.20  abortion (8 consecutive ab's)  Hypertension - Chronic/Pre-existing (No        O10.019  Meds)  Anemia during pregnancy in third trimester     G64.403  Medical complication of pregnancy (R           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 Cord Insertion:      Visualized, central  Amniotic Fluid  AFI FV:      Within normal limits  AFI Sum(cm)     %Tile       Largest Pocket(cm)  8.55            12          4.27  RUQ(cm)       RLQ(cm)       LUQ(cm)        LLQ(cm)  1.93          0             4.27           2.35 ---------------------------------------------------------------------- Biophysical Evaluation  Amniotic F.V:   Within normal limits       F. Tone:        Observed  F. Movement:    Observed                   Score:          8/8  F. Breathing:   Observed ---------------------------------------------------------------------- Biometry  BPD:      89.7  mm     G. Age:  36w 2d         58  %    CI:        69.87   %  70 - 86                                                          FL/HC:      19.1   %    20.1 - 22.1  HC:      342.4  mm     G. Age:  39w 3d         70  %    HC/AC:      1.09        0.93 - 1.11  AC:      314.6  mm     G. Age:  35w 3d         31  %    FL/BPD:     73.0   %    71 - 87  FL:       65.5  mm     G. Age:  33w 5d        2.6  %    FL/AC:      20.8   %    20 - 24  LV:        4.3  mm  Est. FW:    2686  gm    5 lb 15 oz      28  % ---------------------------------------------------------------------- Gestational Age  U/S Today:     36w 2d                                        EDD:   12/23/21  Best:          36w 3d     Det. By:   Previous Ultrasound      EDD:   12/22/21                                      (05/26/21) ---------------------------------------------------------------------- Anatomy  Cranium:               Appears normal         LVOT:                   Previously seen  Cavum:                 Previously seen        Aortic Arch:            Previously seen  Ventricles:            Appears normal         Ductal Arch:            Previously seen  Choroid Plexus:        Previously seen        Diaphragm:              Appears normal  Cerebellum:            Previously seen        Stomach:                Appears normal, left  sided  Posterior Fossa:       Previously seen        Abdomen:                Appears normal  Nuchal Fold:           Not applicable (>96    Abdominal Wall:         Previously seen                         wks GA)  Face:                  Orbits and profile     Cord Vessels:           Previously seen                         previously seen  Lips:                  Previously seen        Kidneys:                Appear normal  Palate:                Not well visualized    Bladder:                Appears normal  Thoracic:              Previously seen        Spine:                  Previously seen  Heart:                 Appears normal         Upper Extremities:      Previously seen                         (4CH, axis, and                         situs)  RVOT:                  Previously seen        Lower Extremities:      Previously seen  Other:  Female gender previously seen. Nasal bone prev visualized. ---------------------------------------------------------------------- Cervix Uterus Adnexa  Cervix  Not visualized (advanced GA >24wks) ---------------------------------------------------------------------- Impression  Follow up growth due to A2GDM, AMA, and hypothyroidism  Normal interval growth with measurements consistent with  dates  Good fetal movement and  amniotic fluid volume  Biophysical profile 8/8 ---------------------------------------------------------------------- Recommendations  Continue weekly testing with plan for delivery between 37-39  pending maternal and fetal status. ----------------------------------------------------------------------               Sander Nephew, MD Electronically Signed Final Report   11/27/2021 03:46 pm ----------------------------------------------------------------------     Assessment: 69w3dEstimated Date of Delivery: 12/22/21 [id number] Prolonged Fetal deceleration in the office with BPP 6/10 Scheduled for a C section in 2 days Previous C section x 2 History of SBO/SBR Declines tubal ligation for sterilization:  I counseled extensively regarding her specific future risks  Breech   Patient Active Problem List   Diagnosis Date Noted   AMA (advanced maternal age) multigravida 325+ third trimester 11/25/2021   Unwanted  fertility 10/28/2021   Pain, dental 10/28/2021   Assistance needed with transportation 09/17/2021   Hypoglycemia associated with gestational diabetes ([place]) 09/12/2021   Anemia in pregnancy, second trimester 09/12/2021   Acquired hypothyroidism 08/12/2021   Pelvic adhesive disease 08/05/2021   Chronic hypertension affecting pregnancy 08/05/2021   Gestational diabetes mellitus (GDM) affecting pregnancy, antepartum 07/10/2021   Supervision of high risk pregnancy, antepartum 06/21/2021   Previous cesarean delivery x 2 affecting pregnancy, antepartum 06/21/2021   S/P small bowel resection 06/21/2021   H/O unilateral salpingectomy 06/21/2021   Severe bipolar I disorder, current or most recent episode depressed ([place]) 04/05/2020   AKI (acute kidney injury) ([place]) 01/22/2020   Renal calculus, right 01/22/2020   Hypercalcemia 07/07/2019   Primary hyperparathyroidism ([place]) 07/07/2019   UTI in pregnancy 07/02/2019   History of small bowel obstruction 07/02/2019   Bipolar disorder, current  episode mixed, moderate ([place]) 02/02/2017   Generalized anxiety disorder 02/02/2017    Plan: Repeat Caesarean section Pt is aware of her extensive intra and peri operative risks especially Bowel injury She is adamantly declining tubal ligation  Florian Buff 12/11/2021 5:48 PM

## 2021-12-11 NOTE — Discharge Summary (Signed)
Postpartum Discharge Summary  Date of Service updated***     Patient Name: Theresa Mccoy DOB: 1981-02-03 MRN: [id number]  Date of admission: 12/11/2021 Delivery date:12/11/2021  Delivering provider: Florian Buff  Date of discharge: 12/12/2021  Admitting diagnosis: S/P C-section [Z98.891] Intrauterine pregnancy: [redacted]w[redacted]d    Secondary diagnosis:  Principal Problem:   S/P C-section Active Problems:   Bipolar disorder, current episode mixed, moderate ([facility])   Generalized anxiety disorder   [place]roidism ([place]   Previous cesarean delivery x 2 affecting pregnancy, antepartum   S/P small bowel resection   H/O unilateral salpingectomy   Gestational diabetes mellitus (GDM) affecting pregnancy, antepartum   Chronic hypertension affecting pregnancy   Acquired hypothyroidism   AMA (advanced maternal age) multigravida 35+, third trimester  Additional problems: ***    Discharge diagnosis: Term Pregnancy Delivered                                              Post partum procedures:{Postpartum procedures:23558} Augmentation: N/A Complications: None  Hospital course: Sceduled C/S   41y.o. yo [id number] 339w3das admitted to the hospital 12/11/2021 for scheduled cesarean section with the following indication: Presented from the office due to prolonged fetal deceleration on NST. Delivery details are as follows:  Membrane Rupture Time/Date: 8:19 PM ,12/11/2021   Delivery Method:C-Section, Low Transverse  Details of operation can be found in separate operative note.  Patient had an uncomplicated postpartum course. She was started on Norvasc 1039mor her blood pressures. She is ambulating, tolerating a regular diet, passing flatus, and urinating well. Patient is discharged home in stable condition on  12/12/21        Newborn Data: Birth date:12/11/2021  Birth time:8:20 PM  Gender:Female  Living status:Living  Apgars:7 ,8  Weight:3150 g     Magnesium Sulfate received: No BMZ received:  No Rhophylac:No MMR:No T-DaP:Given prenatally Flu: No Transfusion:{Transfusion received:30440034}  Physical exam  Vitals:   12/12/21 0029 12/12/21 0130 12/12/21 0500 12/12/21 0816  BP: 134/80 (!) 127/53 128/73 119/81  Pulse: (!) 50 (!) 53 (!) 56 (!) 54  Resp:    20  Temp:    (!) 97.5 F (36.4 C)  TempSrc:    Oral  SpO2:    99%  Weight:      Height:       General: {Exam; general:21111117} Lochia: {Desc; appropriate/inappropriate:30686::"appropriate"} Uterine Fundus: {Desc; firm/soft:30687} Incision: {Exam; incision:21111123} DVT Evaluation: {Exam; dvt:2111122} Labs: Lab Results  Component Value Date   WBC 14.6 (H) 12/12/2021   HGB 9.3 (L) 12/12/2021   HCT 28.0 (L) 12/12/2021   MCV 94.6 12/12/2021   PLT 216 12/12/2021      Latest Ref Rng & Units 12/09/2021    3:34 PM  CMP  Glucose 70 - 99 mg/dL 67   BUN 6 - 20 mg/dL 11   Creatinine 0.44 - 1.00 mg/dL 1.10   Sodium 135 - 145 mmol/L 138   Potassium 3.5 - 5.1 mmol/L 3.4   Chloride 98 - 111 mmol/L 111   CO2 22 - 32 mmol/L 20   Calcium 8.9 - 10.3 mg/dL 8.5   Total Protein 6.5 - 8.1 g/dL 6.7   Total Bilirubin 0.3 - 1.2 mg/dL 0.4   Alkaline Phos 38 - 126 U/L 196   AST 15 - 41 U/L 17   ALT 0 - 44 U/L  13    Edinburgh Score:     No data to display           After visit meds:  Allergies as of 12/12/2021       Reactions   Codeine Other (See Comments)   Constipation     Med Rec must be completed prior to using this [facility]***        Discharge home in stable condition Infant Feeding: {Baby feeding:23562} Infant Disposition:{CHL IP OB HOME WITH BJSEGB:15176} Discharge instruction: per After Visit Summary and Postpartum booklet. Activity: Advance as tolerated. Pelvic rest for 6 weeks.  Diet: {OB HYWV:37106269} Future Appointments: Future Appointments  Date Time Provider [place]  12/13/2021  2:00 PM [facility] PAT 4 [facility] None  12/20/2021  3:30 PM WL-IR 1 [facility]-IR [place]  12/26/2021  2:00 PM  Dory Horn, LCSW [facility] None  01/20/2022 10:55 AM Woodroe Mode, MD [facility]  02/21/2022 10:10 AM Shamleffer, Melanie Crazier, MD [facility] None  02/27/2022  2:30 PM Salley Slaughter, NP [facility] None   Follow up Visit:  Message sent to [facility] by Dr Higinio Plan:  Please schedule this patient for a In person postpartum visit in 6 weeks with the following provider: Any provider. Additional Postpartum F/U:Postpartum Depression checkup, 2 hour GTT, Incision check 1 week, and BP check 1 week  High risk pregnancy complicated by:  h/o bowel resection, GDM, cHTN Delivery mode:  C-Section, Low Transverse  Anticipated Birth Control:  Unsure> previously signed BTL papers but adamantly declined on admission.    12/12/2021 Patriciaann Clan, DO

## 2021-12-11 NOTE — Anesthesia Procedure Notes (Signed)
Spinal  Patient location during procedure: OR Start time: 12/11/2021 7:50 PM End time: 12/11/2021 7:55 PM Reason for block: surgical anesthesia Staffing Performed: anesthesiologist  Anesthesiologist: Merlinda Frederick, MD Preanesthetic Checklist Completed: patient identified, IV checked, risks and benefits discussed, surgical consent, monitors and equipment checked, pre-op evaluation and timeout performed Spinal Block Patient position: sitting Prep: DuraPrep Patient monitoring: continuous pulse ox, blood pressure and heart rate Approach: midline Location: L3-4 Injection technique: catheter Needle Needle type: Pencan and Tuohy  Needle gauge: 25 G Needle length: 12.7 cm Catheter type: closed end flexible Catheter size: 19 g Assessment Sensory level: T3 Events: CSF return Additional Notes The patient was prepped and draped in the usual sterile fashion. A combined spinal-epidural was performed with a 9 cm 17g Tuohy needle and loss of resistance technique. After encountering LOR, a 12.7 cm 25g spinal needle was introduced via the Tuohy and free flow of CSF was obtained prior to injection of local anesthetic. The spinal needle was removed and a 19 g flexible epidural catheter placed prior to Tuohy removal. Patient tolerated the procedure well without complications.

## 2021-12-11 NOTE — Telephone Encounter (Signed)
Preadmission screen  Message left with her mother regarding no show of preadmission appointment

## 2021-12-11 NOTE — Anesthesia Preprocedure Evaluation (Addendum)
Anesthesia Evaluation  Patient identified by MRN, date of birth, ID band Patient awake    Reviewed: Allergy & Precautions, NPO status , Patient's Chart, lab work & pertinent test results  Airway Mallampati: II  TM Distance: >3 FB Neck ROM: Full    Dental  (+) Poor Dentition Multiple missing teeth and overall poor dentition:   Pulmonary Current Smoker,    Pulmonary exam normal breath sounds clear to auscultation       Cardiovascular hypertension, Normal cardiovascular exam Rhythm:Regular Rate:Normal     Neuro/Psych PSYCHIATRIC DISORDERS Anxiety Depression Bipolar Disorder    GI/Hepatic   Endo/Other  diabetes, GestationalHypothyroidism   Renal/GU Renal disease (kidney stone with nephrostomy tube in place)     Musculoskeletal   Abdominal   Peds  Hematology  (+) Blood dyscrasia, anemia ,   Anesthesia Other Findings   Reproductive/Obstetrics                            Anesthesia Physical Anesthesia Plan  ASA: 3  Anesthesia Plan: Spinal   Post-op Pain Management:    Induction: Intravenous  PONV Risk Score and Plan: 1 and Treatment may vary due to age or medical condition  Airway Management Planned: Natural Airway  Additional Equipment: None  Intra-op Plan:   Post-operative Plan:   Informed Consent: I have reviewed the patients History and Physical, chart, labs and discussed the procedure including the risks, benefits and alternatives for the proposed anesthesia with the patient or authorized representative who has indicated his/her understanding and acceptance.       Plan Discussed with: Anesthesiologist  Anesthesia Plan Comments:         Anesthesia Quick Evaluation

## 2021-12-11 NOTE — Patient Instructions (Signed)
Theresa Mccoy  12/11/2021   Your procedure is scheduled on:  12/13/2021  Arrive at 47 at Entrance C on [address] at [facility]  and [employer]. You are invited to use the FREE valet parking or use the Visitor's parking deck.  Pick up the phone at the desk and dial [phone].  Call this number if you have problems the morning of surgery: [phone]  Remember:   Do not eat food:(After Midnight) Desps de medianoche.  Do not drink clear liquids: (After Midnight) Desps de medianoche.  Take these medicines the morning of surgery with A SIP OF WATER:  Take levothyroxine as prescribed   Do not wear jewelry, make-up or nail polish.  Do not wear lotions, powders, or perfumes. Do not wear deodorant.  Do not shave 48 hours prior to surgery.  Do not bring valuables to the hospital.  [facility] is not   responsible for any belongings or valuables brought to the hospital.  Contacts, dentures or bridgework may not be worn into surgery.  Leave suitcase in the car. After surgery it may be brought to your room.  For patients admitted to the hospital, checkout time is 11:00 AM the day of              discharge.      Please read over the following fact sheets that you were given:     Preparing for Surgery

## 2021-12-12 ENCOUNTER — Encounter (HOSPITAL_COMMUNITY): Payer: Self-pay | Admitting: Obstetrics & Gynecology

## 2021-12-12 LAB — RPR: RPR Ser Ql: NONREACTIVE

## 2021-12-12 LAB — CBC
HCT: 28 % — ABNORMAL LOW (ref 36.0–46.0)
Hemoglobin: 9.3 g/dL — ABNORMAL LOW (ref 12.0–15.0)
MCH: 31.4 pg (ref 26.0–34.0)
MCHC: 33.2 g/dL (ref 30.0–36.0)
MCV: 94.6 fL (ref 80.0–100.0)
Platelets: 216 10*3/uL (ref 150–400)
RBC: 2.96 MIL/uL — ABNORMAL LOW (ref 3.87–5.11)
RDW: 13.2 % (ref 11.5–15.5)
WBC: 14.6 10*3/uL — ABNORMAL HIGH (ref 4.0–10.5)
nRBC: 0 % (ref 0.0–0.2)

## 2021-12-12 MED ORDER — CEFADROXIL 500 MG PO CAPS
500.0000 mg | ORAL_CAPSULE | Freq: Two times a day (BID) | ORAL | Status: DC
Start: 2021-12-12 — End: 2021-12-14
  Administered 2021-12-12 – 2021-12-14 (×5): 500 mg via ORAL
  Filled 2021-12-12 (×7): qty 1

## 2021-12-12 MED ORDER — IBUPROFEN 600 MG PO TABS
600.0000 mg | ORAL_TABLET | Freq: Four times a day (QID) | ORAL | Status: DC | PRN
  Administered 2021-12-12 – 2021-12-13 (×3): 600 mg via ORAL
  Filled 2021-12-12 (×3): qty 1

## 2021-12-12 MED ORDER — ACETAMINOPHEN 325 MG PO TABS
650.0000 mg | ORAL_TABLET | Freq: Four times a day (QID) | ORAL | Status: DC | PRN
  Administered 2021-12-14: 650 mg via ORAL
  Filled 2021-12-12: qty 2

## 2021-12-12 NOTE — Lactation Note (Signed)
This note was copied from a baby's chart. Lactation Consultation Note  Patient Name: Theresa Mccoy QASTM'H Date: 12/12/2021 Reason for consult: Initial assessment;Early term 37-38.6wks;1st time breastfeeding (C/S delivery) Age:41 hours P3, ETI female infant, C/S delivery. Mom's feeding choice is breast and formula feeding. Mom latched infant on her right breast using the football hold position, infant latched with depth and BF for 20 minutes, mom was having her tubing replace due leaking by RN in her left arm.  Mom will continue to breastfeed infant according to hunger cues, on demand, 8 to 12+ times within 24 hours, skin to skin. Mom knows to call RN/LC for further latch assistance if needed. Mom already knows how to hand express, she demonstrated hand expression to [place]. Mom made aware of O/P services, breastfeeding support groups, community resources, and our phone # for post-discharge questions.   Maternal Data Has patient been taught Hand Expression?: Yes Does the patient have breastfeeding experience prior to this delivery?: No  Feeding Mother's Current Feeding Choice: Breast Milk and Formula  LATCH Score Latch: Grasps breast easily, tongue down, lips flanged, rhythmical sucking.  Audible Swallowing: Spontaneous and intermittent  Type of Nipple: Everted at rest and after stimulation  Comfort (Breast/Nipple): Soft / non-tender  Hold (Positioning): Full assist, staff holds infant at breast  LATCH Score: 8   Lactation Tools Discussed/Used    Interventions Interventions: Breast feeding basics reviewed;Assisted with latch;Skin to skin;Breast compression;Adjust position;Support pillows;Position options;Expressed milk;Hand express;Education;LC Services brochure  Discharge Pump: Personal (Per mom , she had DEBP at home but she cannot remember the name of the pump.)  Consult Status Consult Status: Follow-up Date: 12/12/21 Follow-up type: In-patient    Vicente Serene 12/12/2021, 12:18 AM

## 2021-12-12 NOTE — Patient Instructions (Addendum)
DUE TO COVID-19 ONLY TWO VISITORS  (aged 41 and older)  ARE ALLOWED TO COME WITH YOU AND STAY IN THE WAITING ROOM ONLY DURING PRE OP AND PROCEDURE.   **NO VISITORS ARE ALLOWED IN THE SHORT STAY AREA OR RECOVERY ROOM!!**  IF YOU WILL BE ADMITTED INTO THE HOSPITAL YOU ARE ALLOWED ONLY FOUR SUPPORT PEOPLE DURING VISITATION HOURS ONLY (7 AM -8PM)   The support person(s) must pass our screening, gel in and out, and wear a mask at all times, including in the patient's room. Patients must also wear a mask when staff or their support person are in the room. Visitors GUEST BADGE MUST BE WORN VISIBLY  One adult visitor may remain with you overnight and MUST be in the room by 8 P.M.     Your procedure is scheduled on: 12/23/21   Report to [facility] Main Entrance    Report to admitting at   8:15 AM   Call this number if you have problems the morning of surgery [phone]   Do not eat food or drink:After Midnight.           If you have questions, please contact your surgeon's office.   FOLLOW BOWEL PREP AND ANY ADDITIONAL PRE OP INSTRUCTIONS YOU RECEIVED FROM YOUR SURGEON'S OFFICE!!!     Oral Hygiene is also important to reduce your risk of infection.                                    Remember - BRUSH YOUR TEETH THE MORNING OF SURGERY WITH YOUR REGULAR TOOTHPASTE   Do NOT smoke after Midnight   Take these medicines the morning of surgery with A SIP OF WATER: Levothyroxine, Famotidine                                You may not have any metal on your body including hair pins, jewelry, and body piercing             Do not wear make-up, lotions, powders, perfumes/cologne, or deodorant  Do not wear nail polish including gel and S&S, artificial/acrylic nails, or any other type of covering on natural nails including finger and toenails. If you have artificial nails, gel coating, etc. that needs to be removed by a nail salon please have this removed prior to surgery or surgery may  need to be canceled/ delayed if the surgeon/ anesthesia feels like they are unable to be safely monitored.   Do not shave  48 hours prior to surgery.                  Do not bring valuables to the hospital. [place].   Contacts, dentures or bridgework may not be worn into surgery.   Bring small overnight bag day of surgery.   DO NOT [place]. PHARMACY WILL DISPENSE MEDICATIONS LISTED ON YOUR MEDICATION LIST TO YOU DURING YOUR ADMISSION [place]!    Patients discharged on the day of surgery will not be allowed to drive home.  Someone NEEDS to stay with you for the first 24 hours after anesthesia.   Special Instructions: Bring a copy of your healthcare power of attorney and living will documents  the day of surgery if you haven't scanned them before.              Please read over the following fact sheets you were given: IF YOU HAVE QUESTIONS ABOUT YOUR PRE-OP INSTRUCTIONS PLEASE CALL [phone]     [facility] - Preparing for Surgery Before surgery, you can play an important role.  Because skin is not sterile, your skin needs to be as free of germs as possible.  You can reduce the number of germs on your skin by washing with CHG (chlorahexidine gluconate) soap before surgery.  CHG is an antiseptic cleaner which kills germs and bonds with the skin to continue killing germs even after washing. Please DO NOT use if you have an allergy to CHG or antibacterial soaps.  If your skin becomes reddened/irritated stop using the CHG and inform your nurse when you arrive at Short Stay. Do not shave (including legs and underarms) for at least 48 hours prior to the first CHG shower.   Please follow these instructions carefully:  1.  Shower with CHG Soap the night before surgery and the  morning of Surgery.  2.  If you choose to wash your hair, wash your hair first as usual with your  normal  shampoo.  3.  After  you shampoo, rinse your hair and body thoroughly to remove the  shampoo.                            4.  Use CHG as you would any other liquid soap.  You can apply chg directly  to the skin and wash                       Gently with a scrungie or clean washcloth.  5.  Apply the CHG Soap to your body ONLY FROM THE NECK DOWN.   Do not use on face/ open                           Wound or open sores. Avoid contact with eyes, ears mouth and genitals (private parts).                       Wash face,  Genitals (private parts) with your normal soap.             6.  Wash thoroughly, paying special attention to the area where your surgery  will be performed.  7.  Thoroughly rinse your body with warm water from the neck down.  8.  DO NOT shower/wash with your normal soap after using and rinsing off  the CHG Soap.                9.  Pat yourself dry with a clean towel.            10.  Wear clean pajamas.            11.  Place clean sheets on your bed the night of your first shower and do not  sleep with pets. Day of Surgery : Do not apply any lotions/deodorants the morning of surgery.  Please wear clean clothes to the hospital/surgery center.  FAILURE TO FOLLOW THESE INSTRUCTIONS MAY RESULT IN THE CANCELLATION OF YOUR SURGERY  ________________________________________________________________________

## 2021-12-12 NOTE — Progress Notes (Addendum)
POSTPARTUM PROGRESS NOTE  POD #1  Subjective:  Theresa Mccoy is a 41 y.o. [id number] s/p rLTCS at [redacted]w[redacted]d No acute events overnight. She reports she is doing well. She denies any problems with ambulating, voiding or po intake. Still having some N/V better with anti-emetics. She has passed flatus. Pain is well controlled.  Lochia is mild.  Objective: Blood pressure 128/73, pulse (!) 56, temperature 97.7 F (36.5 C), resp. rate 17, height 5' (1.524 m), weight 66.7 kg, SpO2 100 %, unknown if currently breastfeeding.  Physical Exam:  General: alert, cooperative and no distress Chest: no respiratory distress Heart: regular rate, distal pulses intact Uterine Fundus: firm, appropriately tender DVT Evaluation: No calf swelling or tenderness Extremities: minimal edema Skin: warm, dry; incision clean/dry/intact w/ honeycomb dressing in place  Recent Labs    12/11/21 1751 12/12/21 0427  HGB 11.1* 9.3*  HCT 33.4* 28.0*    Assessment/Plan: Theresa Mccoy a 41y.o. [id number]/p rLTCS at 325w3dor NRFHT.  POD#1 - Doing welll; pain well controlled.   Routine postpartum care  OOB, ambulated  Lovenox for VTE prophylaxis Acute blood loss Anemia: asymptomatic  Start po ferrous sulfate every other day   #Chronic hypertension: Received one dose of procardia overnight, starting Norvasc today.   #Bipolar disorder: Cont home haldol.   Consented for neonatal circ, note in infants chart.   Contraception: declines  Feeding: formula   Dispo: Plan for discharge tomorrow or the following day.   LOS: 1 day   SaDarrelyn HillockDO OB Fellow  12/12/2021, 7:52 AM

## 2021-12-13 ENCOUNTER — Encounter (HOSPITAL_COMMUNITY)
Admission: RE | Admit: 2021-12-13 | Discharge: 2021-12-13 | Disposition: A | Discharge status: Home / Self Care | Payer: Medicaid Other | Source: Ambulatory Visit

## 2021-12-13 ENCOUNTER — Encounter (HOSPITAL_COMMUNITY)
Admission: RE | Admit: 2021-12-13 | Discharge: 2021-12-13 | Disposition: A | Discharge status: Home / Self Care | Payer: Medicaid Other | Source: Ambulatory Visit | Attending: Primary Care | Admitting: Primary Care

## 2021-12-13 HISTORY — DX: Gestational diabetes mellitus in pregnancy, unspecified control: O24.419

## 2021-12-13 HISTORY — DX: Hypothyroidism, unspecified: E03.9

## 2021-12-13 NOTE — Lactation Note (Signed)
This note was copied from a baby's chart. Lactation Consultation Note  Patient Name: Theresa Mccoy NTZGY'F Date: 12/13/2021 Reason for consult: Follow-up assessment;Mother's request;Difficult latch;1st time breastfeeding Age:41 hours, ETI female infant with -4% weight loss. Mom's current feeding choice is: breast and formula feeding. Mom plans to work on latching infant at the breast tonight and will ask RN/LC for further latch assistance if needed, this is mom's 1st time breastfeeding.  Mom will latch infant first at breast, according to hunger cues, 8 to 12+ times within 24 hours, skin to skin and afterwards supplement infant with formula ( this is her feeding choice). Mom latched infant on her right breast using the football hold position, infant latched with depth and breastfeed for 20 minutes. Afterwards infant was supplemented with 15 mls of formula using yellow slow flow bottle nipple.  Mom has breastfeeding supplemental sheet-Day 2 mom knows to offer 7-12 mls ) per feeding with breastfeeding. Maternal Data    Feeding Mother's Current Feeding Choice: Breast Milk and Formula  LATCH Score Latch: Grasps breast easily, tongue down, lips flanged, rhythmical sucking.  Audible Swallowing: Spontaneous and intermittent  Type of Nipple: Everted at rest and after stimulation  Comfort (Breast/Nipple): Soft / non-tender  Hold (Positioning): Full assist, staff holds infant at breast (Mom was on phone with husband during feeding, [place] held infant at the breast.)  LATCH Score: 8   Lactation Tools Discussed/Used    Interventions Interventions: Position options;Support pillows;Adjust position;Breast compression;Skin to skin;Assisted with latch;Education  Discharge    Consult Status Consult Status: Follow-up Date: 12/14/21 Follow-up type: In-patient    Vicente Serene 12/13/2021, 7:30 PM

## 2021-12-13 NOTE — Lactation Note (Signed)
This note was copied from a baby's chart. Lactation Consultation Note  Patient Name: Theresa Mccoy CEQFD'V Date: 12/13/2021   Age:41 hours [facility], mom  was eating dinner at this time and infant was in [name]  for his circumcision.Per mom, infant is not latching well and she would like latch assistance later today, she still wants to breastfeed infant although he has been having a lot of formula.  Maternal Data    Feeding    LATCH Score                    Lactation Tools Discussed/Used    Interventions    Discharge    Consult Status      Vicente Serene 12/13/2021, 4:02 PM

## 2021-12-13 NOTE — Progress Notes (Signed)
RN walked into room at 0200 and asked patient if she had gone to the bathroom again to void. Pt stated she "peed somewhere else". RN asked for clarification and patient stated she had voided in her brief. RN asked why pt didn't use the restroom in the toilet and she said she "didn't feel like getting up". RN noted this incident as a urine occurrence not a measured urine output. RN requested patient pee in toilet from now on so RN can measure urine output post c-section.

## 2021-12-13 NOTE — Progress Notes (Signed)
Pt wasn't able to com to the PAT visit because she had a C-sec last night. We will reschedule.

## 2021-12-13 NOTE — Social Work (Signed)
CLINICAL SOCIAL WORK MATERNAL/CHILD NOTE  Patient Details  Name: Boy Devina Nuno MRN: [id number] Date of Birth: 12/11/2021  Date:  12/13/2021  Clinical Social Worker Initiating Note:  Jearldean Gutt, LCSW Date/Time: Initiated:  12/13/21/1737     Child's Name:  Dion Douglas   Biological Parents:  Mother, Father (MOB: Novelle Gulledge 03/28/1981 FOB: Dion Douglas 12-06-1987)   Need for Interpreter:  None   Reason for Referral:  Current Substance Use/Substance Use During Pregnancy  , Behavioral Health Concerns   Address:  [address] [place] [address]    Phone number:  [phone] (home)     Additional phone number:   Household Members/Support Persons (HM/SP):   Household Member/Support Person 1, Household Member/Support Person 2   HM/SP Name Relationship DOB or Age  HM/SP -1 Dion Douglas Spouse 12-06-1987  HM/SP -2 "DaeDae" (Mr. Gordan) Friend unknown  HM/SP -3        HM/SP -4        HM/SP -5        HM/SP -6        HM/SP -7        HM/SP -8          Natural Supports (not living in the home):  Children, Parent   Professional Supports: Therapist, Other (Comment) ([facility]-Psychiatrist)   Employment: Unemployed (FOB employed at [employer])   Type of Work:     Education:  9 to 11 years   Homebound arranged: No  Financial Resources:  Medicaid   Other Resources:  Food Stamps     Cultural/Religious Considerations Which May Impact Care:    Strengths:  Ability to meet basic needs  , Home prepared for child  , Psychotropic Medications   Psychotropic Medications:  Other meds (Haldol)      Pediatrician:       Pediatrician List:   [place]  [facility]   [place]    [place]    [place]    [place]    [place]      Pediatrician Fax Number:    Risk Factors/Current Problems:  Substance Use     Cognitive State:  Able to Concentrate  , Alert     Mood/Affect:  Calm  , Interested     CSW  Assessment:  CSW received consult for hx anxiety, bipolar, THC use. CSW met with MOB to offer support and complete assessment.    CSW met with MOB at bedside and introduced CSW role. CSW observed MOB in bed on a phone call and the infant asleep in the bassinet. MOB ended the call to speak with CSW. MOB confirmed the demographic information on hospital file is correct. MOB reported that she lives with her spouse "Dion" and friend "DaeDae (Mr. Gordan)." MOB reported "I am not good at remembering names" as she could not recall the friends first name.  MOB identified her spouse as her primary support. MOB reported that she has two older children that live in [place] however "they are too far away to be really supportive." MOB reported that she is unemployed, and her spouse works at [employer]. MOB reported that she receives both WIC and FS. CSW reminded MOB to call and follow up. MOB reported that she misplaced the number for WIC. CSW provided MOB with the contact information. CSW inquired if MOB had items for the infant. MOB reported she has a bassinet, car seat, some diapers, and wipes. MOB reported that FOB   will purchase additional diapers and wipes. CSW discussed community referrals. MOB gave CSW permission to make a referral to [facility] and [facility].   CSW inquired about MOB mental health history. MOB acknowledged that she has a history of Bipolar, PTSD and anxiety. MOB reported that she sees IBH counselor "Roselyn Reef" regularly at the [facility] clinic and feels it is helpful. MOB reported she see also see a therapist and receives medication through [facility] center. MOB reported she has upcoming appointment with both [place] providers in June. MOB reported she treats her symptoms with Haldol 57m which she feels is helpful. MOB reported that she has been feeling fine and "concentrating on the baby."  CSW assessed further. MOB reported that she has been bottle feeding and trying to breastfeed the  baby. MOB reported when the baby does not latch to the breast, she feeds with formula. CSW encouraged MOB efforts. CSW discussed PPD. MOB was knowledgeable about PPD and denied history. CSW provided education regarding the baby blues period vs. perinatal mood disorders, discussed following up with her current providers for treatment. CSW recommended MOB complete a self-evaluation during the postpartum time period using the New Mom Checklist from Postpartum Progress and encouraged MOB to contact a medical professional if symptoms are noted at any time.  CSW assessed MOB for safety. MOB denied thoughts of harm to self and others. MOB denied domestic violence concerns.   CSW inquired about MOB substance use during the pregnancy. 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 MOB reported, "I smoked because it helps calm my nerves." MOB reported when she is anxious, "I have the shakes and there is nothing I can do." MOB reported, "I do not inhale the smoke." CSW informed MOB about the hospital drug screen policy. MOB made aware that CSW will follow the infant's CDS and make a report to CPS, if warranted. MOB reported understanding and had no questions. MOB denied any history of current CPS involvement. MOB declined substance use resources.   CSW provided review of Sudden Infant Death Syndrome (SIDS) precautions.  MOB has chosen [facility] for the infant's follow up care. MOB reported that she will use a taxi to get to the infant's appointment on Monday. CSW assessed MOB for additional need. MOB reported no further need.   Infant's UDS was negative. CSW will continue to monitor the infant's CDS and make a report, if warranted.   CSW identifies no further need for intervention and no barriers to discharge at this time.  CSW Plan/Description:  Sudden Infant Death Syndrome (SIDS) Education, CSW Will Continue to Monitor Umbilical Cord Tissue Drug  Screen Results and Make Report if Warranted, Other Information/Referral to [place] Perinatal Mood and Anxiety Disorder (PMADs) Education, No Further Intervention Required/No Barriers to Discharge    NLia Hopping LCSW 12/13/2021, 6:24 PM

## 2021-12-13 NOTE — Progress Notes (Signed)
Postpartum Day 2 Subjective: no complaints, up ad lib, voiding, tolerating PO, + flatus, and still having some incontinence of urine  Objective: Blood pressure 120/76, pulse 76, temperature 98.5 F (36.9 C), resp. rate 20, height 5' (1.524 m), weight 147 lb (66.7 kg), SpO2 100 %, unknown if currently breastfeeding.  Physical Exam:  General: alert, cooperative, appears older than stated age, no distress, and slowed mentation Lochia: appropriate Uterine Fundus: firm Incision: healing well, no significant drainage, no dehiscence, no significant erythema DVT Evaluation: No evidence of DVT seen on physical exam.  Recent Labs    12/11/21 1751 12/12/21 0427  HGB 11.1* 9.3*  HCT 33.4* 28.0*   Assessment/Plan: Plan for discharge tomorrow and Social Work consult   LOS: 2 days   Theresa Mccoy 12/13/2021, 3:25 PM

## 2021-12-13 NOTE — Progress Notes (Signed)
Rn called Theresa Mccoy regarding patient's BP per order if BP is equal to or greater than 160/110 call dr, Patient is on procardia.

## 2021-12-14 ENCOUNTER — Other Ambulatory Visit: Payer: Self-pay

## 2021-12-14 LAB — GLUCOSE, RANDOM: Glucose, Bld: 68 mg/dL — ABNORMAL LOW (ref 70–99)

## 2021-12-14 MED ORDER — OXYCODONE HCL 5 MG PO TABS: 5.0000 mg | ORAL_TABLET | ORAL | 0 refills | Status: AC | PRN

## 2021-12-14 MED ORDER — ACETAMINOPHEN 325 MG PO TABS: 650.0000 mg | ORAL_TABLET | Freq: Four times a day (QID) | ORAL | 2 refills | Status: AC | PRN

## 2021-12-14 MED ORDER — AMLODIPINE BESYLATE 10 MG PO TABS: 10.0000 mg | ORAL_TABLET | Freq: Every day | ORAL | 3 refills | Status: AC

## 2021-12-14 MED ORDER — IBUPROFEN 600 MG PO TABS: 600.0000 mg | ORAL_TABLET | Freq: Four times a day (QID) | ORAL | 0 refills | Status: AC | PRN

## 2021-12-14 MED ORDER — FUROSEMIDE 20 MG PO TABS
20.0000 mg | ORAL_TABLET | Freq: Every day | ORAL | Status: DC
  Administered 2021-12-14: 20 mg via ORAL
  Filled 2021-12-14: qty 1

## 2021-12-14 MED ORDER — FUROSEMIDE 20 MG PO TABS: 20.0000 mg | ORAL_TABLET | Freq: Every day | ORAL | 0 refills | Status: AC

## 2021-12-14 NOTE — Lactation Note (Signed)
This note was copied from a baby's chart. Lactation Consultation Note  Patient Name: Theresa Mccoy HENID'P Date: 12/14/2021 Reason for consult: Follow-up assessment;Early term 37-38.6wks;Infant weight loss;Other (Comment);Breastfeeding assistance (baby has had many bottles. per mom would like to breast feed . LC reviewed supply and demand. LC reviewed breaatfeeding basics and D/C teaching. Baby latched well, with depth and swallows. Mom aware of the [facility] resources after D/C.) Age:24 hours  Maternal Data Has patient been taught Hand Expression?: Yes  Feeding Mother's Current Feeding Choice: Breast Milk and Formula  LATCH Score Latch: Grasps breast easily, tongue down, lips flanged, rhythmical sucking.  Audible Swallowing: Spontaneous and intermittent  Type of Nipple: Everted at rest and after stimulation  Comfort (Breast/Nipple): Filling, red/small blisters or bruises, mild/mod discomfort  Hold (Positioning): Assistance needed to correctly position infant at breast and maintain latch.  LATCH Score: 8   Lactation Tools Discussed/Used    Interventions Interventions: Breast feeding basics reviewed;Assisted with latch;Skin to skin;Breast massage;Hand express;Reverse pressure;Breast compression;Adjust position;Support pillows;Education;LC Services brochure  Discharge Discharge Education: Engorgement and breast care;Warning signs for feeding baby Pump: DEBP;Personal WIC Program: Yes  Consult Status Consult Status: Complete Date: 12/14/21    Myer Haff 12/14/2021, 9:13 AM

## 2021-12-17 ENCOUNTER — Encounter: Payer: Self-pay | Admitting: Family Medicine

## 2021-12-18 ENCOUNTER — Encounter (HOSPITAL_COMMUNITY)
Admission: RE | Admit: 2021-12-18 | Discharge: 2021-12-18 | Disposition: A | Discharge status: Home / Self Care | Payer: Medicaid Other | Source: Ambulatory Visit | Attending: Urology | Admitting: Urology

## 2021-12-18 ENCOUNTER — Other Ambulatory Visit: Payer: Self-pay

## 2021-12-18 ENCOUNTER — Encounter (HOSPITAL_COMMUNITY): Payer: Self-pay

## 2021-12-18 ENCOUNTER — Telehealth: Payer: Self-pay | Admitting: Family Medicine

## 2021-12-18 HISTORY — DX: Headache, unspecified: R51.9

## 2021-12-18 HISTORY — DX: Personal history of urinary calculi: Z87.442

## 2021-12-18 MED FILL — Heparin Sodium (Porcine) Inj 1000 Unit/ML: INTRAMUSCULAR | Qty: 30 | Status: CN

## 2021-12-18 MED FILL — Sodium Chloride IV Soln 0.9%: INTRAVENOUS | Qty: 1000 | Status: CN

## 2021-12-18 NOTE — Patient Instructions (Signed)
DUE TO COVID-19 ONLY TWO VISITORS  (aged 41 and older)  ARE ALLOWED TO COME WITH YOU AND STAY IN THE WAITING ROOM ONLY DURING PRE OP AND PROCEDURE.   **NO VISITORS ARE ALLOWED IN THE SHORT STAY AREA OR RECOVERY ROOM!!**   Your procedure is scheduled on: 12/23/21   Report to [facility] Main Entrance    Report to admitting at   8:15 AM   Call this number if you have problems the morning of surgery [phone]   Do not eat food :After Midnight. Clear liquids until 0730 am            If you have questions, please contact your surgeon's office.   FOLLOW BOWEL PREP AND ANY ADDITIONAL PRE OP INSTRUCTIONS YOU RECEIVED FROM YOUR SURGEON'S OFFICE!!!     Oral Hygiene is also important to reduce your risk of infection.                                    Remember - BRUSH YOUR TEETH THE MORNING OF SURGERY WITH YOUR REGULAR TOOTHPASTE   Do NOT smoke after Midnight   Take these medicines the morning of surgery with A SIP OF WATER: Levothyroxine, Famotidine                                You may not have any metal on your body including hair pins, jewelry, and body piercing             Do not wear make-up, lotions, powders, perfumes/cologne, or deodorant  Do not wear nail polish including gel and S&S, artificial/acrylic nails, or any other type of covering on natural nails including finger and toenails. If you have artificial nails, gel coating, etc. that needs to be removed by a nail salon please have this removed prior to surgery or surgery may need to be canceled/ delayed if the surgeon/ anesthesia feels like they are unable to be safely monitored.   Do not shave  48 hours prior to surgery.                  Do not bring valuables to the hospital. [place].   Contacts, dentures or bridgework may not be worn into surgery.   Bring small overnight bag day of surgery.   DO NOT [place]. PHARMACY  WILL DISPENSE MEDICATIONS LISTED ON YOUR MEDICATION LIST TO YOU DURING YOUR ADMISSION [place]!    Patients discharged on the day of surgery will not be allowed to drive home.  Someone NEEDS to stay with you for the first 24 hours after anesthesia.   Special Instructions: Bring a copy of your healthcare power of attorney and living will documents the day of surgery if you haven't scanned them before.              Please read over the following fact sheets you were given: IF YOU HAVE QUESTIONS ABOUT YOUR PRE-OP INSTRUCTIONS PLEASE CALL [phone]     [facility] - Preparing for Surgery Before surgery, you can play an important role.  Because skin is not sterile, your skin needs to be as free of germs as possible.  You can reduce the number of  germs on your skin by washing with CHG (chlorahexidine gluconate) soap before surgery.  CHG is an antiseptic cleaner which kills germs and bonds with the skin to continue killing germs even after washing. Please DO NOT use if you have an allergy to CHG or antibacterial soaps.  If your skin becomes reddened/irritated stop using the CHG and inform your nurse when you arrive at Short Stay. Do not shave (including legs and underarms) for at least 48 hours prior to the first CHG shower.   Please follow these instructions carefully:  1.  Shower with CHG Soap the night before surgery and the  morning of Surgery.  2.  If you choose to wash your hair, wash your hair first as usual with your  normal  shampoo.  3.  After you shampoo, rinse your hair and body thoroughly to remove the  shampoo.                            4.  Use CHG as you would any other liquid soap.  You can apply chg directly  to the skin and wash                       Gently with a scrungie or clean washcloth.  5.  Apply the CHG Soap to your body ONLY FROM THE NECK DOWN.   Do not use on face/ open                           Wound or open sores. Avoid contact with eyes, ears mouth and genitals  (private parts).                       Wash face,  Genitals (private parts) with your normal soap.             6.  Wash thoroughly, paying special attention to the area where your surgery  will be performed.  7.  Thoroughly rinse your body with warm water from the neck down.  8.  DO NOT shower/wash with your normal soap after using and rinsing off  the CHG Soap.                9.  Pat yourself dry with a clean towel.            10.  Wear clean pajamas.            11.  Place clean sheets on your bed the night of your first shower and do not  sleep with pets. Day of Surgery : Do not apply any lotions/deodorants the morning of surgery.  Please wear clean clothes to the hospital/surgery center.  FAILURE TO FOLLOW THESE INSTRUCTIONS MAY RESULT IN THE CANCELLATION OF YOUR SURGERY  ________________________________________________________________________

## 2021-12-18 NOTE — Progress Notes (Addendum)
PCP - none goes to women's Health care Cardiologist - no  PPM/ICD -  Device Orders -  Rep Notified -   Chest x-ray -  EKG -  Stress Test -  ECHO -  Cardiac Cath -   Sleep Study -  CPAP -   Fasting Blood Sugar -  Checks Blood Sugar _____ times a day  Blood Thinner Instructions: Aspirin Instructions:  81 mg asa last dose 2 weeks ago  ERAS Protcol - PRE-SURGERY Ensure or G2-    COVID vaccine -no  Activity--Able to complete ADL's without SOB  Anesthesia review: C-section 12-11-21  Patient denies shortness of breath, fever, cough and chest pain at PAT appointment   All instructions explained to the patient, with a verbal understanding of the material. Patient agrees to go over the instructions while at home for a better understanding. Patient also instructed to self quarantine after being tested for COVID-19. The opportunity to ask questions was provided.

## 2021-12-18 NOTE — BH Specialist Note (Deleted)
Integrated Behavioral Health via Telemedicine Visit  12/18/2021 Theresa Mccoy [id number]  Number of [place] Clinician visits: 5-Fifth Visit  Session Start time: [phone]   Session End time: 0900  Total time in minutes: 9   Referring Provider: *** Patient/Family location: [facility] Provider location: *** All persons participating in visit: *** Types of Service: {CHL AMB TYPE OF SERVICE:3014335771}  I connected with Theresa Mccoy and/or Theresa Mccoy's {family members:20773} via  Telephone or Geologist, engineering  (Video is Caregility application) and verified that I am speaking with the correct person using two identifiers. Discussed confidentiality: {YES/NO:21197}  I discussed the limitations of telemedicine and the availability of in person appointments.  Discussed there is a possibility of technology failure and discussed alternative modes of communication if that failure occurs.  I discussed that engaging in this telemedicine visit, they consent to the provision of behavioral healthcare and the services will be billed under their insurance.  Patient and/or legal guardian expressed understanding and consented to Telemedicine visit: {YES/NO:21197}  Presenting Concerns: Patient and/or family reports the following symptoms/concerns: *** Duration of problem: ***; Severity of problem: {Mild/Moderate/Severe:20260}  Patient and/or Family's Strengths/Protective Factors: {CHL AMB BH PROTECTIVE [phone]}  Goals Addressed: Patient will:  Reduce symptoms of: {IBH Symptoms:21014056}   Increase knowledge and/or ability of: {IBH Patient Tools:21014057}   Demonstrate ability to: {IBH Goals:21014053}  Progress towards Goals: {CHL AMB BH PROGRESS TOWARDS GOALS:[phone]}  Interventions: Interventions utilized:  {IBH Interventions:21014054} Standardized Assessments completed: {IBH Screening Tools:21014051}  Patient and/or Family  Response: ***  Assessment: Patient currently experiencing ***.   Patient may benefit from ***.  Plan: Follow up with behavioral health clinician on : *** Behavioral recommendations: *** Referral(s): {IBH Referrals:21014055}  I discussed the assessment and treatment plan with the patient and/or parent/guardian. They were provided an opportunity to ask questions and all were answered. They agreed with the plan and demonstrated an understanding of the instructions.   They were advised to call back or seek an in-person evaluation if the symptoms worsen or if the condition fails to improve as anticipated.  Theresa Hamman Keymarion Bearman, LCSW

## 2021-12-18 NOTE — Telephone Encounter (Signed)
Called patient to move incision check due to the office being closed during the afternoon, there was no answer to the phone call and the option to leave a voicemail was unavailable.

## 2021-12-19 ENCOUNTER — Ambulatory Visit: Payer: Medicaid Other

## 2021-12-19 ENCOUNTER — Encounter (HOSPITAL_COMMUNITY)
Admission: RE | Admit: 2021-12-19 | Discharge: 2021-12-19 | Disposition: A | Discharge status: Home / Self Care | Payer: Medicaid Other | Source: Ambulatory Visit | Attending: Interventional Radiology | Admitting: Interventional Radiology

## 2021-12-19 DIAGNOSIS — Z01812 Encounter for preprocedural laboratory examination: Secondary | ICD-10-CM | POA: Insufficient documentation

## 2021-12-19 DIAGNOSIS — O24419 Gestational diabetes mellitus in pregnancy, unspecified control: Secondary | ICD-10-CM | POA: Insufficient documentation

## 2021-12-19 DIAGNOSIS — Z3A00 Weeks of gestation of pregnancy not specified: Secondary | ICD-10-CM | POA: Diagnosis not present

## 2021-12-19 DIAGNOSIS — Z01818 Encounter for other preprocedural examination: Secondary | ICD-10-CM

## 2021-12-19 LAB — CBC
HCT: 34.9 % — ABNORMAL LOW (ref 36.0–46.0)
Hemoglobin: 11.3 g/dL — ABNORMAL LOW (ref 12.0–15.0)
MCH: 30.6 pg (ref 26.0–34.0)
MCHC: 32.4 g/dL (ref 30.0–36.0)
MCV: 94.6 fL (ref 80.0–100.0)
Platelets: 306 10*3/uL (ref 150–400)
RBC: 3.69 MIL/uL — ABNORMAL LOW (ref 3.87–5.11)
RDW: 12.6 % (ref 11.5–15.5)
WBC: 7.5 10*3/uL (ref 4.0–10.5)
nRBC: 0 % (ref 0.0–0.2)

## 2021-12-19 LAB — HEMOGLOBIN A1C
Hgb A1c MFr Bld: 4.9 % (ref 4.8–5.6)
Mean Plasma Glucose: 93.93 mg/dL

## 2021-12-19 LAB — BASIC METABOLIC PANEL
Anion gap: 8 (ref 5–15)
BUN: 15 mg/dL (ref 6–20)
CO2: 21 mmol/L — ABNORMAL LOW (ref 22–32)
Calcium: 8.9 mg/dL (ref 8.9–10.3)
Chloride: 111 mmol/L (ref 98–111)
Creatinine, Ser: 1.09 mg/dL — ABNORMAL HIGH (ref 0.44–1.00)
GFR, Estimated: >60 mL/min (ref >60)
Glucose, Bld: 87 mg/dL (ref 70–99)
Potassium: 3.7 mmol/L (ref 3.5–5.1)
Sodium: 140 mmol/L (ref 135–145)

## 2021-12-19 MED FILL — Heparin Sodium (Porcine) Inj 1000 Unit/ML: INTRAMUSCULAR | Qty: 30 | Status: AC

## 2021-12-19 MED FILL — Sodium Chloride IV Soln 0.9%: INTRAVENOUS | Qty: 1000 | Status: AC

## 2021-12-20 ENCOUNTER — Other Ambulatory Visit (HOSPITAL_COMMUNITY): Payer: Self-pay | Admitting: Interventional Radiology

## 2021-12-20 ENCOUNTER — Inpatient Hospital Stay (HOSPITAL_COMMUNITY)
Admission: AD | Admit: 2021-12-20 | Discharge: 2021-12-20 | Disposition: A | Discharge status: Home / Self Care | Payer: Medicaid Other | Attending: Obstetrics & Gynecology | Admitting: Obstetrics & Gynecology

## 2021-12-20 ENCOUNTER — Ambulatory Visit (HOSPITAL_COMMUNITY)
Admission: RE | Admit: 2021-12-20 | Discharge: 2021-12-20 | Disposition: A | Discharge status: Home / Self Care | Payer: Medicaid Other | Source: Ambulatory Visit | Attending: Interventional Radiology | Admitting: Interventional Radiology

## 2021-12-20 ENCOUNTER — Encounter (HOSPITAL_COMMUNITY): Payer: Self-pay | Admitting: Radiology

## 2021-12-20 ENCOUNTER — Telehealth: Payer: Self-pay | Admitting: *Deleted

## 2021-12-20 ENCOUNTER — Telehealth (HOSPITAL_COMMUNITY): Payer: Self-pay | Admitting: *Deleted

## 2021-12-20 DIAGNOSIS — N1339 Other hydronephrosis: Secondary | ICD-10-CM

## 2021-12-20 DIAGNOSIS — Z436 Encounter for attention to other artificial openings of urinary tract: Secondary | ICD-10-CM | POA: Insufficient documentation

## 2021-12-20 DIAGNOSIS — T8131XA Disruption of external operation (surgical) wound, not elsewhere classified, initial encounter: Secondary | ICD-10-CM

## 2021-12-20 DIAGNOSIS — O1093 Unspecified pre-existing hypertension complicating the puerperium: Secondary | ICD-10-CM | POA: Diagnosis not present

## 2021-12-20 DIAGNOSIS — O900 Disruption of cesarean delivery wound: Secondary | ICD-10-CM | POA: Diagnosis present

## 2021-12-20 HISTORY — PX: IR PATIENT EVAL TECH 0-60 MINS: IMG5564

## 2021-12-20 NOTE — MAU Provider Note (Signed)
History     CSN: [id number]  Arrival date and time: 12/20/21 1202   Event Date/Time   First Provider Initiated Contact with Patient 12/20/21 1228      Chief Complaint  Patient presents with   Post-op Problem   Theresa Mccoy, a  41 y.o. [id number] at 70w3dpresents to MAU with complaints of bleeding from open incision 10 days post-op repeat C/s. She states that she noticed it, over the last 2 days. She denies pain, swelling, redness and tenderness. She also denies fever, chills, diaphoresis or nausea and vomiting. No other complaints.           OB History     Gravida  12   Para  3   Term  3   Preterm      AB  9   Living  3      SAB  8   IAB      Ectopic  1   Multiple  0   Live Births  3           Past Medical History:  Diagnosis Date   [place] [place]   Anemia    Depression    Essential hypertension 02/02/2017   Gestational diabetes    Headache    History of kidney stones    Hypernatremia 01/22/2020   Hyperthyroidism    Hypothyroidism    PTSD (post-traumatic stress disorder)    SBO (small bowel obstruction) ([facility] 07/07/2019   Sepsis ([facility] 01/22/2020    Past Surgical History:  Procedure Laterality Date   BOWEL RESECTION N/A 07/08/2019   Procedure: Small Bowel Resection;  Surgeon: CClovis Riley MD;  [facility]  Service: General;  Laterality: N/A;   CESAREAN SECTION     CESAREAN SECTION N/A 12/11/2021   Procedure: CESAREAN SECTION;  Surgeon: EFlorian Buff MD;  Location: [facility] LD [facility]S;  Service: Obstetrics;  Laterality: N/A;   DILATION AND CURETTAGE OF UTERUS     HAND SURGERY Left    IR NEPHROSTOMY EXCHANGE RIGHT  10/25/2021   IR NEPHROSTOMY PLACEMENT RIGHT  09/27/2021   LAPAROTOMY N/A 07/08/2019   Procedure: EXPLORATORY LAPAROTOMY;  Surgeon: CClovis Riley MD;  [place] [facility]  Service: General;  Laterality: N/A;   OOPHORECTOMY Right    PARATHYROIDECTOMY Right 09/13/2021   Procedure: RIGHT INFERIOR  PARATHYROIDECTOMY;  Surgeon: GArmandina Gemma MD;  [place] [facility]  Service: General;  Laterality: Right;   SALPINGECTOMY      Family History  Problem Relation Age of Onset   Diabetes Brother    Asthma Paternal Grandmother     Social History   Tobacco Use   Smoking status: Some Days    Packs/day: 0.25    Types: Cigarettes   Smokeless tobacco: Never  Vaping Use   Vaping Use: Never used  Substance Use Topics   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 30 tablet 2    amLODipine (NORVASC) 10 MG tablet Take 1 tablet (10 mg total) by  mouth daily. 30 tablet 3    Cholecalciferol (VITAMIN D3) 50 MCG (2000 UT) capsule Take 1 capsule (2,000 Units total) by mouth daily. 90 capsule 3    furosemide (LASIX) 20 MG tablet Take 1 tablet (20 mg total) by mouth daily. 4 tablet 0    haloperidol (HALDOL) 1 MG tablet Take 2 tablets (2 mg total) by mouth at bedtime. 60 tablet 3    ibuprofen (ADVIL) 600 MG tablet Take 1 tablet (600 mg total) by mouth every 6 (six) hours as needed for moderate pain. 30 tablet 0    levothyroxine (SYNTHROID) 50 MCG tablet Take 1 tablet (50 mcg total) by mouth daily at 6 (six) AM. 90 tablet 3    oxyCODONE (OXY IR/ROXICODONE) 5 MG immediate release tablet Take 1-2 tablets (5-10 mg total) by mouth every 4 (four) hours as needed for moderate pain. 30 tablet 0    polyethylene glycol (MIRALAX / GLYCOLAX) 17 g packet Take 17 g by mouth 2 (two) times daily. 60 each 2    Prenatal 27-1 MG TABS Take 1 tablet by mouth daily. 30 tablet 12     Review of Systems  All other systems reviewed and  are negative.  Physical Exam   Blood pressure 137/89, pulse 71, temperature 98.6 F (37 C), temperature source Oral, resp. rate 16, SpO2 100 %, not currently breastfeeding.  Physical Exam Vitals and nursing note reviewed.  Constitutional:      General: She is not in acute distress. HENT:     Head: Normocephalic.  Pulmonary:     Effort: Pulmonary effort is normal. No respiratory distress.  Abdominal:     Palpations: Abdomen is soft.     Tenderness: There is no abdominal tenderness.     Comments: Incision closed. Small 1cm area noted on left side, that notes red drainage. No pus or other signs of infection.   Musculoskeletal:     Cervical back: Normal range of motion.  Neurological:     Mental Status: She is alert and oriented to person, place, and time.  Psychiatric:        Mood and Affect: Mood normal.    Patient Vitals for the past 24 hrs:  BP Temp Temp src Pulse Resp SpO2  12/20/21 1342 (!) 160/88 -- -- (!) 52 -- --  12/20/21 1223 137/89 98.6 F (37 C) Oral 71 16 100 %    MAU Course  Procedures [name], CNM. Agreed would closed. Small wound dehiscence noted on left side.   MDM Area cleaned and closed with steri-strips.   Assessment and Plan   1. Dehiscence of operative wound, initial encounter   2. Postpartum care following cesarean delivery    - Steri strips applied. Education provided on how long to keep steri strips applied and shower care.  - Infection precautions provided.  - Elevated BP in triage. CHTN noted in [facility].  - Patient discharged home in stable condition.  - Patient may return to MAU as needed.   Theresa Mccoy 12/20/2021, 12:28 PM

## 2021-12-20 NOTE — MAU Note (Signed)
Dried blood and loose incisional glue removed.  Steri strips reapplied as instructed.

## 2021-12-20 NOTE — Procedures (Signed)
Spoke with patient who stated she is having surgery 6/19. It was determined that the PCN should remain as is and can be removed during surgery or if Dr Abner Greenspan prefers, the patient can return to IR at a later date. Per the patients request the PCN drain bag was changed.

## 2021-12-20 NOTE — Telephone Encounter (Signed)
Pt left message stating that she would like to change her upcoming appointment to a later date. I returned her call and she stated that she will be having kidney surgery on 6/19 and will not be able to come in on 6/21 for C/S incision check and scheduled. I asked if she can come in today and she stated that she will try to get a ride. She confirmed that she had removed the Honeycomb dressing and is washing the incision daily as she is not permitted to take showers @ this time d/t having nephrostomy tube.   46  Spoke with pt again and she stated that she has not been able to arrange a ride to our office this morning. She reported that she has seen bleeding from the incision and is still having abdominal pain. I advised that it is normal to have pain for several weeks however should be getting less. Since she has noticed bleeding from incision, I instructed pt to go to MAU for evaluation today as soon as she can get a ride. She voiced understanding.

## 2021-12-20 NOTE — MAU Note (Addendum)
Theresa Mccoy is a 42 y.o. at 57w3dhere in MAU reporting: bleeding from incision. Noted yesterday. They wanted her to come in and make sure she isn't getting infected.  Scant amt of dried blood on left side of incision and on 'pamper'. No vag bleeding.   Onset of complaint: yesterday Pain score: 8 137/89 P 71  R 16 T98.6 o2Sat  100%

## 2021-12-20 NOTE — Telephone Encounter (Signed)
Voicemail not setup. Unable to leave message.  Odis Hollingshead, RN 12-20-2021 at 10:34am

## 2021-12-23 ENCOUNTER — Encounter (HOSPITAL_COMMUNITY)
Admission: RE | Disposition: A | Discharge status: Home / Self Care | Payer: Self-pay | Source: Home / Self Care | Attending: Urology

## 2021-12-23 ENCOUNTER — Encounter (HOSPITAL_COMMUNITY): Payer: Self-pay | Admitting: Urology

## 2021-12-23 ENCOUNTER — Ambulatory Visit (HOSPITAL_COMMUNITY): Payer: Medicaid Other

## 2021-12-23 ENCOUNTER — Ambulatory Visit (HOSPITAL_COMMUNITY)
Admission: RE | Admit: 2021-12-23 | Discharge: 2021-12-23 | Disposition: A | Discharge status: Home / Self Care | Payer: Medicaid Other | Attending: Urology | Admitting: Urology

## 2021-12-23 ENCOUNTER — Ambulatory Visit (HOSPITAL_BASED_OUTPATIENT_CLINIC_OR_DEPARTMENT_OTHER): Payer: Medicaid Other | Admitting: Certified Registered"

## 2021-12-23 ENCOUNTER — Ambulatory Visit (HOSPITAL_COMMUNITY): Payer: Medicaid Other | Admitting: Certified Registered"

## 2021-12-23 ENCOUNTER — Ambulatory Visit: Payer: Medicaid Other

## 2021-12-23 DIAGNOSIS — N261 Atrophy of kidney (terminal): Secondary | ICD-10-CM | POA: Insufficient documentation

## 2021-12-23 DIAGNOSIS — I10 Essential (primary) hypertension: Secondary | ICD-10-CM | POA: Insufficient documentation

## 2021-12-23 DIAGNOSIS — F17210 Nicotine dependence, cigarettes, uncomplicated: Secondary | ICD-10-CM | POA: Diagnosis not present

## 2021-12-23 DIAGNOSIS — N201 Calculus of ureter: Secondary | ICD-10-CM | POA: Diagnosis not present

## 2021-12-23 DIAGNOSIS — F319 Bipolar disorder, unspecified: Secondary | ICD-10-CM | POA: Insufficient documentation

## 2021-12-23 DIAGNOSIS — F419 Anxiety disorder, unspecified: Secondary | ICD-10-CM | POA: Insufficient documentation

## 2021-12-23 DIAGNOSIS — E039 Hypothyroidism, unspecified: Secondary | ICD-10-CM | POA: Insufficient documentation

## 2021-12-23 DIAGNOSIS — N132 Hydronephrosis with renal and ureteral calculous obstruction: Secondary | ICD-10-CM | POA: Insufficient documentation

## 2021-12-23 DIAGNOSIS — Z79899 Other long term (current) drug therapy: Secondary | ICD-10-CM | POA: Insufficient documentation

## 2021-12-23 DIAGNOSIS — Z87442 Personal history of urinary calculi: Secondary | ICD-10-CM | POA: Diagnosis not present

## 2021-12-23 DIAGNOSIS — O24419 Gestational diabetes mellitus in pregnancy, unspecified control: Secondary | ICD-10-CM

## 2021-12-23 DIAGNOSIS — Z01818 Encounter for other preprocedural examination: Secondary | ICD-10-CM

## 2021-12-23 HISTORY — PX: CYSTOSCOPY/URETEROSCOPY/HOLMIUM LASER/STENT PLACEMENT: SHX6546

## 2021-12-23 LAB — GLUCOSE, CAPILLARY: Glucose-Capillary: 93 mg/dL (ref 70–99)

## 2021-12-23 SURGERY — CYSTOSCOPY/URETEROSCOPY/HOLMIUM LASER/STENT PLACEMENT
Anesthesia: General | Site: Renal | Laterality: Right

## 2021-12-23 MED ORDER — HYDROMORPHONE HCL 1 MG/ML IJ SOLN: 0.2500 mg | INTRAMUSCULAR | Status: DC | PRN

## 2021-12-23 MED ORDER — DEXAMETHASONE SODIUM PHOSPHATE 10 MG/ML IJ SOLN
INTRAMUSCULAR | Status: DC | PRN
  Administered 2021-12-23: 4 mg via INTRAVENOUS

## 2021-12-23 MED ORDER — LACTATED RINGERS IV SOLN: INTRAVENOUS | Status: DC

## 2021-12-23 MED ORDER — FENTANYL CITRATE (PF) 250 MCG/5ML IJ SOLN
INTRAMUSCULAR | Status: DC | PRN
  Administered 2021-12-23: 100 ug via INTRAVENOUS

## 2021-12-23 MED ORDER — ROCURONIUM BROMIDE 10 MG/ML (PF) SYRINGE
PREFILLED_SYRINGE | INTRAVENOUS | Status: AC
  Filled 2021-12-23: qty 10

## 2021-12-23 MED ORDER — AMISULPRIDE (ANTIEMETIC) 5 MG/2ML IV SOLN: 10.0000 mg | Freq: Once | INTRAVENOUS | Status: DC | PRN

## 2021-12-23 MED ORDER — PROPOFOL 10 MG/ML IV BOLUS
INTRAVENOUS | Status: AC
  Filled 2021-12-23: qty 20

## 2021-12-23 MED ORDER — LIDOCAINE HCL (PF) 2 % IJ SOLN
INTRAMUSCULAR | Status: AC
  Filled 2021-12-23: qty 5

## 2021-12-23 MED ORDER — PHENYLEPHRINE 80 MCG/ML (10ML) SYRINGE FOR IV PUSH (FOR BLOOD PRESSURE SUPPORT)
PREFILLED_SYRINGE | INTRAVENOUS | Status: AC
Start: 2021-12-23 — End: ?
  Filled 2021-12-23: qty 10

## 2021-12-23 MED ORDER — LIDOCAINE 2% (20 MG/ML) 5 ML SYRINGE
INTRAMUSCULAR | Status: DC | PRN
  Administered 2021-12-23: 50 mg via INTRAVENOUS

## 2021-12-23 MED ORDER — ROCURONIUM BROMIDE 10 MG/ML (PF) SYRINGE
PREFILLED_SYRINGE | INTRAVENOUS | Status: DC | PRN
  Administered 2021-12-23: 40 mg via INTRAVENOUS

## 2021-12-23 MED ORDER — SOD CITRATE-CITRIC ACID 500-334 MG/5ML PO SOLN: 30.0000 mL | Freq: Once | ORAL | Status: DC

## 2021-12-23 MED ORDER — OXYCODONE-ACETAMINOPHEN 5-325 MG PO TABS: 1.0000 | ORAL_TABLET | ORAL | 0 refills | Status: AC | PRN

## 2021-12-23 MED ORDER — FENTANYL CITRATE (PF) 100 MCG/2ML IJ SOLN
INTRAMUSCULAR | Status: AC
  Filled 2021-12-23: qty 2

## 2021-12-23 MED ORDER — CHLORHEXIDINE GLUCONATE 0.12 % MT SOLN: 15.0000 mL | Freq: Once | OROMUCOSAL | Status: DC

## 2021-12-23 MED ORDER — MIDAZOLAM HCL 2 MG/2ML IJ SOLN
INTRAMUSCULAR | Status: DC | PRN
  Administered 2021-12-23: 1 mg via INTRAVENOUS

## 2021-12-23 MED ORDER — SODIUM CHLORIDE 0.9 % IR SOLN
Status: DC | PRN
  Administered 2021-12-23: 6000 mL

## 2021-12-23 MED ORDER — DOCUSATE SODIUM 100 MG PO CAPS: 100.0000 mg | ORAL_CAPSULE | Freq: Every day | ORAL | 0 refills | Status: AC | PRN

## 2021-12-23 MED ORDER — ACETAMINOPHEN 500 MG PO TABS
1000.0000 mg | ORAL_TABLET | Freq: Once | ORAL | Status: AC
Start: 2021-12-23 — End: 2021-12-23
  Administered 2021-12-23: 1000 mg via ORAL
  Filled 2021-12-23: qty 2

## 2021-12-23 MED ORDER — IOHEXOL 300 MG/ML  SOLN
INTRAMUSCULAR | Status: DC | PRN
  Administered 2021-12-23: 9 mL via URETHRAL

## 2021-12-23 MED ORDER — OXYCODONE HCL 5 MG/5ML PO SOLN: 5.0000 mg | Freq: Once | ORAL | Status: DC | PRN

## 2021-12-23 MED ORDER — CEFAZOLIN SODIUM-DEXTROSE 2-4 GM/100ML-% IV SOLN
2.0000 g | Freq: Once | INTRAVENOUS | Status: AC
  Administered 2021-12-23: 2 g via INTRAVENOUS
  Filled 2021-12-23: qty 100

## 2021-12-23 MED ORDER — MIDAZOLAM HCL 2 MG/2ML IJ SOLN
INTRAMUSCULAR | Status: AC
Start: 2021-12-23 — End: ?
  Filled 2021-12-23: qty 2

## 2021-12-23 MED ORDER — PROPOFOL 10 MG/ML IV BOLUS
INTRAVENOUS | Status: DC | PRN
  Administered 2021-12-23: 200 mg via INTRAVENOUS

## 2021-12-23 MED ORDER — OXYCODONE HCL 5 MG PO TABS: 5.0000 mg | ORAL_TABLET | Freq: Once | ORAL | Status: DC | PRN

## 2021-12-23 MED ORDER — SUGAMMADEX SODIUM 200 MG/2ML IV SOLN
INTRAVENOUS | Status: DC | PRN
  Administered 2021-12-23: 200 mg via INTRAVENOUS

## 2021-12-23 MED ORDER — PHENYLEPHRINE HCL (PRESSORS) 10 MG/ML IV SOLN
INTRAVENOUS | Status: DC | PRN
  Administered 2021-12-23: 160 ug via INTRAVENOUS

## 2021-12-23 MED ORDER — CEPHALEXIN 500 MG PO CAPS: 500.0000 mg | ORAL_CAPSULE | Freq: Two times a day (BID) | ORAL | 0 refills | Status: AC

## 2021-12-23 MED ORDER — ONDANSETRON HCL 4 MG/2ML IJ SOLN
INTRAMUSCULAR | Status: DC | PRN
  Administered 2021-12-23: 4 mg via INTRAVENOUS

## 2021-12-23 MED ORDER — ONDANSETRON HCL 4 MG/2ML IJ SOLN: 4.0000 mg | Freq: Once | INTRAMUSCULAR | Status: DC | PRN

## 2021-12-23 SURGICAL SUPPLY — 23 items
BAG URO CATCHER STRL LF (MISCELLANEOUS) ×2 IMPLANT
BASKET ZERO TIP NITINOL 2.4FR (BASKET) ×1 IMPLANT
CATH URETL OPEN 5X70 (CATHETERS) ×2 IMPLANT
CLOTH BEACON ORANGE TIMEOUT ST (SAFETY) ×2 IMPLANT
FIBER LASER MOSES 200 DFL (Laser) IMPLANT
GAUZE SPONGE 4X4 12PLY STRL (GAUZE/BANDAGES/DRESSINGS) ×1 IMPLANT
GLOVE BIOGEL M 7.0 STRL (GLOVE) ×2 IMPLANT
GOWN STRL REUS W/ TWL XL LVL3 (GOWN DISPOSABLE) ×1 IMPLANT
GOWN STRL REUS W/TWL XL LVL3 (GOWN DISPOSABLE) ×1
GUIDEWIRE STR DUAL SENSOR (WIRE) ×4 IMPLANT
GUIDEWIRE ZIPWRE .038 STRAIGHT (WIRE) ×1 IMPLANT
IV NS IRRIG 3000ML ARTHROMATIC (IV SOLUTION) ×2 IMPLANT
KIT TURNOVER KIT A (KITS) ×1 IMPLANT
LASER FIB FLEXIVA PULSE ID 365 (Laser) IMPLANT
MANIFOLD NEPTUNE II (INSTRUMENTS) ×2 IMPLANT
PACK CYSTO (CUSTOM PROCEDURE TRAY) ×2 IMPLANT
SHEATH URETERAL 12FR 45CM (SHEATH) IMPLANT
STENT URET 6FRX24 CONTOUR (STENTS) ×1 IMPLANT
TAPE CLOTH 4X10 WHT NS (GAUZE/BANDAGES/DRESSINGS) ×1 IMPLANT
TRACTIP FLEXIVA PULS ID 200XHI (Laser) IMPLANT
TRACTIP FLEXIVA PULSE ID 200 (Laser) ×1
TUBING CONNECTING 10 (TUBING) ×2 IMPLANT
TUBING UROLOGY SET (TUBING) ×2 IMPLANT

## 2021-12-23 NOTE — Op Note (Signed)
Operative Note  Preoperative diagnosis:  1.  Right distal ureteral stone 2.  Atrophic right kidney  Postoperative diagnosis: 1.  Right distal ureteral stone 2.  Atrophic right kidney  Procedure(s): 1.  Cystoscopy 2.  Right ureteroscopy with laser lithotripsy and basket extraction of stones 3.  Right retrograde pyelogram 4. Right nephrostomy tube removal under fluoroscopic guidance 5. Right ureteral stent placement 6. Fluoroscopy with intraoperative interpretation  Surgeon: Rexene Alberts, MD  Assistants:  None  Anesthesia:  General  Complications:  None  EBL:  Minimal  Specimens: 1. Stones for stone analysis (to be done at [facility])  Drains/Catheters: 1.  Right 6Fr x 24cm ureteral stent WITHOUT a tether string  Intraoperative findings:   Cystoscopy demonstrated no suspicious bladder lesions Right ureteroscopy demonstrated impacting 2 x 2 centimeter distal right ureteral stone.  Successfully fragmented and all stones basket extracted. Severe hydronephrosis of the right kidney. Successful right nephrostomy tube removal. Successful right ureteral stent placement.  Indication:  Theresa Mccoy is a 41 y.o. female with a history of urolithiasis in the setting of pregnancy.  CT A/P 07/2019 demonstrated right renal stone measuring 1 cm.  CT A/P 01/2020 with 9 mm right lower pole stone.  Renal ultrasound obtained in 05/2021 while she was pregnant demonstrated marked right hydronephrosis with substantial cortical thinning.  Repeat renal ultrasound 09/2021 with severe right hydronephrosis and cortical thinning with suspected 1.5 cm stone in the distal right ureter.  She underwent right nephrostomy tube placement and this was exchanged in 10/2021.  She presents today for definitive treatment of her right ureteral stone and nephrostomy tube removal.  Description of procedure: After informed consent was obtained from the patient, the patient was identified and taken to the operating  room and placed in the supine position.  General anesthesia was administered as well as perioperative IV antibiotics.  At the beginning of the case, a time-out was performed to properly identify the patient, the surgery to be performed, and the surgical site.  Sequential compression devices were applied to the lower extremities at the beginning of the case for DVT prophylaxis.  The patient was then placed in the dorsal lithotomy supine position, prepped and draped in sterile fashion.  Preliminary scout fluoroscopy revealed that there was a 2cm calcification area at the right distal ureter, which corresponds to the stone found on the preoperative CT scan. We then passed the 21-French rigid cystoscope through the urethra and into the bladder under vision without any difficulty.  A systematic evaluation of the bladder revealed no evidence of any suspicious bladder lesions.  Ureteral orifices were in normal position.    Under cystoscopic and flouroscopic guidance, we cannulated the right ureteral orifice with a 5-French open-ended ureteral catheter and a gentle retrograde pyelogram was performed, revealing a normal caliber distal ureter.  There was a filling defect about 2 cm of the distal right ureter corresponding to the stone seen on preoperative imaging.  There was some contrast that went proximal to this filling the collecting system with severe hydronephrosis.  I attempted to pass a sensor wire and then a curved angle Glidewire however this would not pass beyond the stone.  Thus, I inserted a semirigid ureteroscope up to the level of the stone.   I was ultimately able to pass a sensor wire proximal to the stone and this was passed into the renal pelvis.  This was secured to the drape as a safety wire.    I then reinserted the semirigid ureteroscope up  to the level of the stone.  Using a 200 m holmium laser fiber, the stone was fragmented completely.  Of note, this took over an hour and a half given the  large stone burden.  A 2.2 French 0 tip basket was then used to remove the fragments under visual guidance.  These were sent for chemical analysis.  Of note, the stone was impacted at the site and there was significant edema and inflammation present.  I did remove oral stone fragments however there was some debris embedded within the wall of the ureter.  I passed the scope into the proximal right ureter noting no additional stone fragments.  I then placed a separate 0.038 sensor wire.  Over this wire, I passed a single-lumen flexible digital ureteroscope and surveyed the kidney with no stones present within the kidney.  Under fluoroscopic guidance, I removed her right nephrostomy tube.  I then performed repeat retrograde pyelogram demonstrating similar severe hydronephrosis.  There is no filling defects.  I surveyed all calyces noting no stone fragments.  I then withdrew the scope along the course of the ureter noting no additional stone fragments and no trauma to the ureter.  The previous site of stone impaction remained inflamed and edematous.   Once the ureteroscope was removed, the Glidewire was backloaded through the rigid cystoscope, which was then advanced down the urethra and into the bladder. We then used the Glidewire under direct vision through the rigid cystoscope and under fluoroscopic guidance and passed up a 6-French, 24 cm double-pigtail ureteral stent up ureter, making sure that the proximal and distal ends coiled within the kidney and bladder respectively.  I then irrigated all of her stone out of her bladder with the cystoscope.  The patient tolerated the procedure well and there was no complication. Patient was awoken from anesthesia and taken to the recovery room in stable condition. I was present and scrubbed for the entirety of the case.  Plan:  Patient will be discharged home.  Follow up with me in 7 to 10 days for stent removal in the office.   Matt R. [facility]   Pager: [phone]

## 2021-12-23 NOTE — Anesthesia Preprocedure Evaluation (Addendum)
Anesthesia Evaluation  Patient identified by MRN, date of birth, ID band Patient awake    Reviewed: Allergy & Precautions, NPO status , Patient's Chart, lab work & pertinent test results  Airway Mallampati: III  TM Distance: >3 FB Neck ROM: Full    Dental no notable dental hx.    Pulmonary Current Smoker and Patient abstained from smoking.,  1cigg/d    Pulmonary exam normal breath sounds clear to auscultation       Cardiovascular hypertension (147/97 in preop), Pt. on medications Normal cardiovascular exam Rhythm:Regular Rate:Normal     Neuro/Psych  Headaches, PSYCHIATRIC DISORDERS Anxiety Depression Bipolar Disorder    GI/Hepatic negative GI ROS, Neg liver ROS,   Endo/Other  diabetesHypothyroidism   Renal/GU Renal InsufficiencyRenal diseaser ureteral stone Cr 1.09  negative genitourinary   Musculoskeletal negative musculoskeletal ROS (+)   Abdominal   Peds  Hematology  (+) Blood dyscrasia, anemia , Hb 11.3, plt 306   Anesthesia Other Findings   Reproductive/Obstetrics Recent c section 1 week ago                           Anesthesia Physical Anesthesia Plan  ASA: 2  Anesthesia Plan: General   Post-op Pain Management: Tylenol PO (pre-op)*   Induction: Intravenous  PONV Risk Score and Plan: 3 and Ondansetron, Dexamethasone, Midazolam and Treatment may vary due to age or medical condition  Airway Management Planned: Oral ETT  Additional Equipment: None  Intra-op Plan:   Post-operative Plan: Extubation in OR  Informed Consent: I have reviewed the patients History and Physical, chart, labs and discussed the procedure including the risks, benefits and alternatives for the proposed anesthesia with the patient or authorized representative who has indicated his/her understanding and acceptance.     Dental advisory given  Plan Discussed with: CRNA  Anesthesia Plan Comments:         Anesthesia Quick Evaluation

## 2021-12-23 NOTE — Transfer of Care (Signed)
Immediate Anesthesia Transfer of Care Note  Patient: Theresa Mccoy  Procedure(s) Performed: CYSTOSCOPY/RETROGRADE/URETEROSCOPY/HOLMIUM LASER/STENT PLACEMENT/ NEPHROSTOMY TUBE REMOVAL (Right: Renal)  Patient Location: PACU  Anesthesia Type:General  Level of Consciousness: awake, alert  and patient cooperative  Airway & Oxygen Therapy: Patient Spontanous Breathing and Patient connected to face mask oxygen  Post-op Assessment: Report given to RN and Post -op Vital signs reviewed and stable  Post vital signs: Reviewed and stable  Last Vitals:  Vitals Value Taken Time  BP 141/99 12/23/21 1210  Temp    Pulse 84 12/23/21 1211  Resp 16 12/23/21 1211  SpO2 100 % 12/23/21 1211  Vitals shown include unvalidated device data.  Last Pain:  Vitals:   12/23/21 0908  TempSrc:   PainSc: 0-No pain         Complications: No notable events documented.

## 2021-12-23 NOTE — Anesthesia Postprocedure Evaluation (Signed)
Anesthesia Post Note  Patient: Theresa Mccoy  Procedure(s) Performed: CYSTOSCOPY/RETROGRADE/URETEROSCOPY/HOLMIUM LASER/STENT PLACEMENT/ NEPHROSTOMY TUBE REMOVAL (Right: Renal)     Patient location during evaluation: PACU Anesthesia Type: General Level of consciousness: awake and alert, oriented and patient cooperative Pain management: pain level controlled Vital Signs Assessment: post-procedure vital signs reviewed and stable Respiratory status: spontaneous breathing, nonlabored ventilation and respiratory function stable Cardiovascular status: blood pressure returned to baseline and stable Postop Assessment: no apparent nausea or vomiting Anesthetic complications: no   No notable events documented.  Last Vitals:  Vitals:   12/23/21 1239 12/23/21 1246  BP: (!) 149/98 140/90  Pulse: 71 78  Resp: 12 13  Temp: 36.4 C 36.4 C  SpO2: 100% 100%    Last Pain:  Vitals:   12/23/21 1246  TempSrc:   PainSc: 0-No pain                 Pervis Hocking

## 2021-12-23 NOTE — H&P (Signed)
Office Visit Report     11/19/2021   --------------------------------------------------------------------------------   Theresa Mccoy  MRN: [id number]  DOB: 10/19/1980, 41 year old Female  SSN:    PRIMARY CARE:    REFERRING:    PROVIDER:  Rexene Alberts, M.D.  LOCATION:  [facility] - [phone]     --------------------------------------------------------------------------------   CC/HPI: Theresa Mccoy is a 41 year old African-American pregnant female with a history of urolithiasis.   1. Urolithiasis in the setting of pregnancy:  -Although she denies a known history of previous urolithiasis, she has had a history of urolithiasis since at least 2020.  -CT A/P 10/2018 demonstrated 2 mm stone in the upper pole of the right kidney, punctate left upper pole stone.  -CT A/P 07/2019 with bulky right lower pole stones measuring 1 cm.  -CT A/P 01/2020 with 9 mm right lower pole stone  -Renal ultrasound 05/2021 with marked right hydronephrosis with substantial cortical thinning suggesting a chronic process with no evidence of stone on that ultrasound.  -Renal ultrasound 09/2021 with similar severe right hydronephrosis and cortical thinning. There is a suspected 1.5 cm stone in the distal right ureter.  -She initially presented to me in 09/2021 with renal ultrasound findings as above. At that time, she was [redacted] weeks pregnant in her third trimester. Her due date is 12/15/2021.  -She underwent right nephrostomy tube placement on 09/27/2021. Right nephrostomy tube was exchanged on 10/25/2021. She has been tolerating the nephrostomy tube with some intermittent discomfort. She denies severe abdominal pain or flank pain. She denies passage of stones. She denies fevers, chills, dysuria. Creatinine in 09/2021 was 1.45.   Patient currently denies fever, chills, sweats, nausea, vomiting, abdominal or flank pain, gross hematuria or dysuria.     ALLERGIES: Codeine    MEDICATIONS: Benadryl Allergy   Haloperidol  Levothyroxine  Tums  Tylenol  Vitamin D3     GU PSH: None   NON-GU PSH: Parathyroidectomy - 09/13/2021 Salpingo Oophorectomy - 05/26/2021     GU PMH: Hydronephrosis - 09/19/2021 Ureteral calculus - 09/19/2021 Ureteral obstruction secondary to calculous - 09/19/2021    NON-GU PMH: Anxiety Depression Diabetes Type 2 Hypercholesterolemia Seizure disorder    FAMILY HISTORY: 1 Daughter - Runs in Family 1 son - Runs in Family Breast Cancer - Runs in Family   SOCIAL HISTORY: Marital Status: Married Ethnicity: Not Hispanic Or Latino; Race: Black or African American Current Smoking Status: Patient smokes occasionally. Has smoked since 09/05/1994.   Tobacco Use Assessment Completed: Used Tobacco in last 30 days? Has never drank.  Drinks 3 caffeinated drinks per day.    REVIEW OF SYSTEMS:    GU Review Female:   Patient denies frequent urination, hard to postpone urination, burning /pain with urination, get up at night to urinate, leakage of urine, stream starts and stops, trouble starting your stream, have to strain to urinate, and being pregnant.  Gastrointestinal (Upper):   Patient denies nausea, vomiting, and indigestion/ heartburn.  Gastrointestinal (Lower):   Patient denies diarrhea and constipation.  Constitutional:   Patient denies fever, night sweats, weight loss, and fatigue.  Skin:   Patient denies skin rash/ lesion and itching.  Eyes:   Patient denies blurred vision and double vision.  Ears/ Nose/ Throat:   Patient denies sore throat and sinus problems.  Hematologic/Lymphatic:   Patient denies easy bruising and swollen glands.  Cardiovascular:   Patient denies leg swelling and chest pains.  Respiratory:   Patient denies cough and shortness of breath.  Endocrine:  Patient denies excessive thirst.  Musculoskeletal:   Patient denies back pain and joint pain.  Neurological:   Patient denies headaches and dizziness.  Psychologic:   Patient denies depression  and anxiety.   VITAL SIGNS: None   MULTI-SYSTEM PHYSICAL EXAMINATION:    Constitutional: Well-nourished. No physical deformities. Normally developed. Good grooming.  Respiratory: No labored breathing, no use of accessory muscles.   Cardiovascular: Normal temperature, normal extremity pulses, no swelling, no varicosities.  Gastrointestinal: No mass, no tenderness, no rigidity, No CVA tenderness; R PCN draining clear yellow urine.     Complexity of Data:  Source Of History:  Patient, Medical Record Summary  Records Review:   Previous Doctor Records  Urine Test Review:   Urinalysis  X-Ray Review: Renal Ultrasound: Reviewed Films. Reviewed Report. Discussed With Patient.     PROCEDURES: None   ASSESSMENT:      ICD-10 Details  1 GU:   Hydronephrosis - N13.0   2   Ureteral calculus - N20.1   3   Ureteral obstruction secondary to calculous - N13.2    PLAN:           Document Letter(s):  Created for Patient: Clinical Summary         Notes:   1. Urolithiasis in the setting of pregnancy:  -Renal ultrasound 09/2021 is stable from 05/2021 with severe right hydronephrosis and cortical thinning.  -I reviewed this with her and discussed she likely has a chronic large stone in her distal right ureter.  -As she is in her third trimester, I counseled that it is inadvisable to proceed with ureteroscopy with laser lithotripsy given potential complications to her and her child.  -S/p right nephrostomy tube placement and exchange, most recently 10/25/2021. She will undergo right nephrostomy tube exchanges every 3-4 weeks.  -Surgery letter sent for cystoscopy, right ureteroscopy, laser lithotripsy, right ureteral stent placement, removal right nephrostomy tube. This to be done after she delivers. Her due date is 12/15/2021.  -I did counsel her that she likely has irreversible kidney function given likely chronic obstructing stone.   Urology Preoperative H&P   Chief Complaint: Right ureteral and renal  stones  History of Present Illness: Theresa Mccoy is a 41 y.o. female with right ureteral and renal stones here for cysto, R RPG, R URS/LL, R stent. Denies fevers, chills, dysuria.    Past Medical History:  Diagnosis Date   AKI (acute kidney injury) ([place]) 01/22/2020   Anemia    Depression    Essential hypertension 02/02/2017   Gestational diabetes    Headache    History of kidney stones    Hypernatremia 01/22/2020   Hyperthyroidism    Hypothyroidism    PTSD (post-traumatic stress disorder)    SBO (small bowel obstruction) ([place]) 07/07/2019   Sepsis ([place]) 01/22/2020    Past Surgical History:  Procedure Laterality Date   BOWEL RESECTION N/A 07/08/2019   Procedure: Small Bowel Resection;  Surgeon: Clovis Riley, MD;  Location: [place];  Service: General;  Laterality: N/A;   CESAREAN SECTION     CESAREAN SECTION N/A 12/11/2021   Procedure: CESAREAN SECTION;  Surgeon: Florian Buff, MD;  Location: [facility];  Service: Obstetrics;  Laterality: N/A;   DILATION AND CURETTAGE OF UTERUS     HAND SURGERY Left    IR NEPHROSTOMY EXCHANGE RIGHT  10/25/2021   IR NEPHROSTOMY PLACEMENT RIGHT  09/27/2021   IR PATIENT EVAL TECH 0-60 MINS  12/20/2021   LAPAROTOMY N/A 07/08/2019   Procedure:  EXPLORATORY LAPAROTOMY;  Surgeon: Clovis Riley, MD;  Location: [place];  Service: General;  Laterality: N/A;   OOPHORECTOMY Right    PARATHYROIDECTOMY Right 09/13/2021   Procedure: RIGHT INFERIOR PARATHYROIDECTOMY;  Surgeon: Armandina Gemma, MD;  Location: [place];  Service: General;  Laterality: Right;   SALPINGECTOMY      Allergies:  Allergies  Allergen Reactions   Codeine Other (See Comments)    Constipation     Family History  Problem Relation Age of Onset   Diabetes Brother    Asthma Paternal Grandmother     Social History:  reports that she has been smoking cigarettes. She has been smoking an average of .25 packs per day. She has never used smokeless tobacco. She reports that she does not  currently use drugs after having used the following drugs: Marijuana. She reports that she does not drink alcohol.  ROS: A complete review of systems was performed.  All systems are negative except for pertinent findings as noted.  Physical Exam:  Vital signs in last 24 hours: Temp:  [99.5 F (37.5 C)] 99.5 F (37.5 C) (06/19 0903) Pulse Rate:  [71] 71 (06/19 0903) Resp:  [16] 16 (06/19 0903) BP: (147)/(97) 147/97 (06/19 0903) SpO2:  [97 %] 97 % (06/19 0903) Weight:  [59 kg] 59 kg (06/19 0908) Constitutional:  Alert and oriented, No acute distress Cardiovascular: Regular rate and rhythm Respiratory: Normal respiratory effort, Lungs clear bilaterally GI: Abdomen is soft, nontender, nondistended, no abdominal masses GU: No CVA tenderness Lymphatic: No lymphadenopathy Neurologic: Grossly intact, no focal deficits Psychiatric: Normal mood and affect  Laboratory Data:  No results for input(s): "WBC", "HGB", "HCT", "PLT" in the last 72 hours.  No results for input(s): "NA", "K", "CL", "GLUCOSE", "BUN", "CALCIUM", "CREATININE" in the last 72 hours.  Invalid input(s): "CO3"   Results for orders placed or performed during the hospital encounter of 12/23/21 (from the past 24 hour(s))  Glucose, capillary     Status: None   Collection Time: 12/23/21  8:44 AM  Result Value Ref Range   Glucose-Capillary 93 70 - 99 mg/dL   No results found for this or any previous visit (from the past 240 hour(s)).  Renal Function: Recent Labs    12/19/21 1430  CREATININE 1.09*   Estimated Creatinine Clearance: 54.6 mL/min (A) (by C-G formula based on SCr of 1.09 mg/dL (H)).  Radiologic Imaging: No results found.  I independently reviewed the above imaging studies.  Assessment and Plan Theresa Mccoy is a 41 y.o. female with right ureteral and renal stones here for cysto, R RPG, R URS/LL, R stent. Denies fevers, chills, dysuria.   -The risks, benefits and alternatives of cystoscopy with R  RPG, R URS/LL, R stent was discussed with the patient.  Risks include, but are not limited to: bleeding, urinary tract infection, ureteral injury, ureteral stricture disease, chronic pain, urinary symptoms, bladder injury, stent migration, the need for nephrostomy tube placement, MI, CVA, DVT, PE and the inherent risks with general anesthesia.  The patient voices understanding and wishes to proceed.     Matt R. Anju Sereno MD 12/23/2021, 10:10 AM  [facility] Pager: [phone]

## 2021-12-23 NOTE — Progress Notes (Signed)
Patient had c-section 12/11/21, no need for urine pregnancy per anesthesia.

## 2021-12-23 NOTE — Discharge Instructions (Signed)
[facility] [phone] Post Ureteroscopy With or Without Stent Instructions  Definitions:  Ureter: The duct that transports urine from the kidney to the bladder. Stent:   A plastic hollow tube that is placed into the ureter, from the kidney to the bladder to prevent the ureter from swelling shut.  GENERAL INSTRUCTIONS:  Despite the fact that no skin incisions were used, the area around the ureter and bladder is raw and irritated. The stent is a foreign body which will further irritate the bladder wall. This irritation is manifested by increased frequency of urination, both day and night, and by an increase in the urge to urinate. In some, the urge to urinate is present almost always. Sometimes the urge is strong enough that you may not be able to stop yourself from urinating. The only real cure is to remove the stent and then give time for the bladder wall to heal which can't be done until the danger of the ureter swelling shut has passed, which varies.  You may see some blood in your urine while the stent is in place and a few days afterwards. Do not be alarmed, even if the urine was clear for a while. Get off your feet and drink lots of fluids until clearing occurs. If you start to pass clots or don't improve, call us.  DIET: You may return to your normal diet immediately. Because of the raw surface of your bladder, alcohol, spicy foods, acid type foods and drinks with caffeine may cause irritation or frequency and should be used in moderation. To keep your urine flowing freely and to avoid constipation, drink plenty of fluids during the day ( 8-10 glasses ). Tip: Avoid cranberry juice because it is very acidic.  ACTIVITY: Your physical activity doesn't need to be restricted. However, if you are very active, you may see some blood in your urine. We suggest that you reduce your activity under these circumstances until the bleeding has stopped.  BOWELS: It is important to  keep your bowels regular during the postoperative period. Straining with bowel movements can cause bleeding. A bowel movement every other day is reasonable. Use a mild laxative if needed, such as Milk of Magnesia 2-3 tablespoons, or 2 Dulcolax tablets. Call if you continue to have problems. If you have been taking narcotics for pain, before, during or after your surgery, you may be constipated. Take a laxative if necessary.   MEDICATION: You should resume your pre-surgery medications unless told not to. In addition you will often be given an antibiotic to prevent infection. These should be taken as prescribed until the bottles are finished unless you are having an unusual reaction to one of the drugs.  PROBLEMS YOU SHOULD REPORT TO [place]: Fevers over 100.5 Fahrenheit. Heavy bleeding, or clots ( See above notes about blood in urine ). Inability to urinate. Drug reactions ( hives, rash, nausea, vomiting, diarrhea ). Severe burning or pain with urination that is not improving.  FOLLOW-UP: You will need a follow-up appointment to monitor your progress. Call for this appointment at the number listed above. Usually the first appointment will be about three to fourteen days after your surgery.  You have a right ureteral stent in place.  Follow-up next week for stent removal in the office.

## 2021-12-23 NOTE — Anesthesia Procedure Notes (Signed)
Procedure Name: Intubation Date/Time: 12/23/2021 10:46 AM  Performed by: Eben Burow, CRNAPre-anesthesia Checklist: Patient identified, Emergency Drugs available, Suction available, Patient being monitored and Timeout performed Patient Re-evaluated:Patient Re-evaluated prior to induction Oxygen Delivery Method: Circle system utilized Preoxygenation: Pre-oxygenation with 100% oxygen Induction Type: IV induction Ventilation: Mask ventilation without difficulty Laryngoscope Size: Mac and 4 Grade View: Grade I Tube type: Oral Tube size: 7.0 mm Number of attempts: 1 Airway Equipment and Method: Stylet Placement Confirmation: ETT inserted through vocal cords under direct vision, positive ETCO2 and breath sounds checked- equal and bilateral Secured at: 21 cm Tube secured with: Tape Dental Injury: Teeth and Oropharynx as per pre-operative assessment

## 2021-12-24 ENCOUNTER — Encounter (HOSPITAL_COMMUNITY): Payer: Self-pay | Admitting: Urology

## 2021-12-25 ENCOUNTER — Ambulatory Visit: Payer: Medicaid Other

## 2021-12-26 ENCOUNTER — Ambulatory Visit (INDEPENDENT_AMBULATORY_CARE_PROVIDER_SITE_OTHER): Payer: Medicaid Other | Admitting: Licensed Clinical Social Worker

## 2021-12-26 DIAGNOSIS — F3162 Bipolar disorder, current episode mixed, moderate: Secondary | ICD-10-CM

## 2021-12-26 NOTE — Progress Notes (Signed)
   THERAPIST PROGRESS NOTE  Virtual Visit via Video Note  I connected with [name] on 12/26/21 at  2:00 PM EDT by a video enabled telemedicine application and verified that I am speaking with the correct person using two identifiers.  Location: Patient: [facility]  Provider: Providers Home    I discussed the limitations of evaluation and management by telemedicine and the availability of in person appointments. The patient expressed understanding and agreed to proceed.     I discussed the assessment and treatment plan with the patient. The patient was provided an opportunity to ask questions and all were answered. The patient agreed with the plan and demonstrated an understanding of the instructions.   The patient was advised to call back or seek an in-person evaluation if the symptoms worsen or if the condition fails to improve as anticipated.  I provided 45 minutes of non-face-to-face time during this encounter.   Dory Horn, LCSW   Participation Level: Active  Behavioral Response: CasualAlertAnxious and Depressed  Type of Therapy: Individual Therapy  Treatment Goals addressed:   ProgressTowards Goals: Progressing  Interventions: CBT and Motivational Interviewing   Suicidal/Homicidal: Nowithout intent/plan  Therapist Response:    Pt was alert and oriented x 5. She was dressed casually and was distracted in session due to 92-week-old newborn child being in the room. Pt presented with slow, scattered, and depressed mood/affect. She was pleasant, and cooperative in session.   LCSW spoke with pt about maintained therapy session as she no showed her last session. Due to no show pt could not be seen for another 6 weeks from todays session. Pt reports that she has multiple traumas to get off trauma from when she was a child. LCSW explain that at [facility] we could only do therapy every 3 to 4 weeks. Pt asked to be referred to another agency that could  accommodate more frequent therapy. LCSW was agreeable and sent electronic referral to [facility] and provided pt with their phone number. LCSW does note that pt was having a challenging time staying focused with newborn baby present in the room. If session continue with this LCSW due to referral falling through this will need to be discussed moving forward due to the attention the baby currently needs.   Interventions/Plan: Plan is for pt to be referred to [facility], referral was sent. LCSW administered a PHQ-9 and pt scored in the severe category. LCSW administered a GAD-7 and pt scored in the severe category for that as well. Pt reports increase is due to lack of sleep, stress, and tension with newborn baby. LCSW educated pt on the importance of taking medications as directed. LCSW educated pt on signs and symptoms of Dx. LCSW used language for praise, encouragement, and empowerment.    Plan: Referral to [facility]   Diagnosis: No diagnosis found.  Collaboration of Care: Other Referral to [place] was advised Release of Information must be obtained prior to any record release in order to collaborate their care with an outside provider. Patient/Guardian was advised if they have not already done so to contact the registration department to sign all necessary forms in order for [place] to release information regarding their care.   Consent: Patient/Guardian gives verbal consent for treatment and assignment of benefits for services provided during this visit. Patient/Guardian expressed understanding and agreed to proceed.   Dory Horn, LCSW 12/26/2021

## 2021-12-27 ENCOUNTER — Ambulatory Visit: Payer: Medicaid Other

## 2022-01-14 ENCOUNTER — Other Ambulatory Visit: Payer: Self-pay

## 2022-01-14 DIAGNOSIS — O24419 Gestational diabetes mellitus in pregnancy, unspecified control: Secondary | ICD-10-CM

## 2022-01-15 NOTE — Progress Notes (Deleted)
Cardio-Obstetrics Clinic  New Evaluation  Date:  01/16/2022   ID:  Theresa Mccoy, DOB December 23, 1980, MRN [id number]  PCP:  Kerin Perna, NP   [facility] Providers Cardiologist:  None  Electrophysiologist:  None     Referring MD: Kerin Perna, NP   Chief Complaint: Chronic hypertension in pregnancy, gestational DMII  History of Present Illness:    Theresa Mccoy is a 41 y.o. female [[id number]] who is being seen today for the evaluation of hypertension in pregnancy and gestational diabetes at the request of Kerin Perna, NP.   Today, ***   Prior CV Studies Reviewed: The following studies were reviewed today: ***  Past Medical History:  Diagnosis Date   AKI (acute kidney injury) ([place]) 01/22/2020   Anemia    Depression    Essential hypertension 02/02/2017   Gestational diabetes    Headache    History of kidney stones    Hypernatremia 01/22/2020   Hyperthyroidism    Hypothyroidism    PTSD (post-traumatic stress disorder)    SBO (small bowel obstruction) ([place]) 07/07/2019   Sepsis ([place]) 01/22/2020    Past Surgical History:  Procedure Laterality Date   BOWEL RESECTION N/A 07/08/2019   Procedure: Small Bowel Resection;  Surgeon: Clovis Riley, MD;  Location: [place];  Service: General;  Laterality: N/A;   CESAREAN SECTION     CESAREAN SECTION N/A 12/11/2021   Procedure: CESAREAN SECTION;  Surgeon: Florian Buff, MD;  [facility];  Service: Obstetrics;  Laterality: N/A;   CYSTOSCOPY/URETEROSCOPY/HOLMIUM LASER/STENT PLACEMENT Right 12/23/2021   Procedure: CYSTOSCOPY/RETROGRADE/URETEROSCOPY/HOLMIUM LASER/STENT PLACEMENT/ NEPHROSTOMY TUBE REMOVAL;  Surgeon: Janith Lima, MD;  [facility];  Service: Urology;  Laterality: Right;   DILATION AND CURETTAGE OF UTERUS     HAND SURGERY Left    IR NEPHROSTOMY EXCHANGE RIGHT  10/25/2021   IR NEPHROSTOMY PLACEMENT RIGHT  09/27/2021   IR PATIENT EVAL TECH 0-60 MINS  12/20/2021   LAPAROTOMY N/A  07/08/2019   Procedure: EXPLORATORY LAPAROTOMY;  Surgeon: Clovis Riley, MD;  Location: [place];  Service: General;  Laterality: N/A;   OOPHORECTOMY Right    PARATHYROIDECTOMY Right 09/13/2021   Procedure: RIGHT INFERIOR PARATHYROIDECTOMY;  Surgeon: Armandina Gemma, MD;  Location: [place];  Service: General;  Laterality: Right;   SALPINGECTOMY     { Click here to update PMH, PSH, OB Hx then refresh note  :1}   OB History     Gravida  12   Para  3   Term  3   Preterm      AB  9   Living  3      SAB  8   IAB      Ectopic  1   Multiple  0   Live Births  3           { Click here to update OB Charting then refresh note  :1}    Current Medications: No outpatient medications have been marked as taking for the 01/17/22 encounter (Appointment) with Freada Bergeron, MD.     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  She also stated that she is unemployed.   Social Determinants of Health   Financial Resource Strain: High Risk (10/29/2021)   Overall Financial Resource Strain (CARDIA)    Difficulty of Paying Living Expenses: Very hard  Food Insecurity: Food  Insecurity Present (11/25/2021)   Hunger Vital Sign    Worried About Running Out of Food in the Last Year: Sometimes true    Ran Out of Food in the Last Year: Sometimes true  Transportation Needs: Unmet Transportation Needs (11/25/2021)   PRAPARE - Hydrologist (Medical): Yes    Lack of Transportation (Non-Medical): Yes  Physical Activity: Inactive (10/29/2021)   Exercise Vital Sign    Days of Exercise per Week: 0 days    Minutes of Exercise per Session: 0 min  Stress: Stress Concern Present (10/29/2021)   [place]    Feeling of Stress : Very much  Social Connections: Socially Isolated (10/29/2021)   Social Connection and Isolation Panel [NHANES]    Frequency of Communication with Friends and Family: Once a week    Frequency of Social Gatherings with Friends and Family: Never    Attends Religious Services: Never    Marine scientist or Organizations: No    Attends Music therapist: Never    Marital Status: Married  { Click here to update SDOH then refresh :1}    Family History  Problem Relation Age of Onset   Diabetes Brother    Asthma Paternal Grandmother    { Click here to update FH then refresh note    :1}   ROS:   Please see the history of present illness.    *** All other systems reviewed and are negative.   Labs/EKG Reviewed:    EKG:   EKG is *** ordered today.  The ekg ordered today demonstrates ***  Recent Labs: 11/11/2021: TSH 1.630 12/09/2021: ALT 13 12/19/2021: BUN 15; Creatinine, Ser 1.09; Hemoglobin 11.3; Platelets 306; Potassium 3.7; Sodium 140   Recent Lipid Panel No results found for: "CHOL", "TRIG", "HDL", "CHOLHDL", "LDLCALC", "LDLDIRECT"  Physical Exam:    VS:  LMP  (Approximate)     Wt Readings from Last 3 Encounters:  12/23/21 130 lb (59 kg)  12/19/21 130 lb (59 kg)  12/11/21 147 lb (66.7 kg)     GEN: *** Well nourished, well  developed in no acute distress HEENT: Normal NECK: No JVD; No carotid bruits LYMPHATICS: No lymphadenopathy CARDIAC: ***RRR, no murmurs, rubs, gallops RESPIRATORY:  Clear to auscultation without rales, wheezing or rhonchi  ABDOMEN: Soft, non-tender, non-distended MUSCULOSKELETAL:  No edema; No deformity  SKIN: Warm and dry NEUROLOGIC:  Alert and oriented x 3 PSYCHIATRIC:  Normal affect    Risk Assessment/Risk Calculators:   { Click to calculate CARPREG II - THEN refresh note :1}    {  Click to caclulate Mod WHO Class of CV Risk - THEN refresh note :1}     { Click for [id number] Score - THEN Refresh Note    :[id number]}      ASSESSMENT & PLAN:    #Chronic HTN: -Continue amlodipine '10mg'$  daily  #Gestational DM: A1C controlled at 4.9.   There are no Patient Instructions on file for this visit.   Dispo:  No follow-ups on file.   Medication Adjustments/Labs and Tests Ordered: Current medicines are reviewed at length with the patient today.  Concerns regarding medicines are outlined above.  Tests Ordered: No orders of the defined types were placed in this encounter.  Medication Changes: No orders of the defined types were placed in this encounter.

## 2022-01-17 ENCOUNTER — Ambulatory Visit: Payer: Self-pay | Admitting: Cardiology

## 2022-01-20 ENCOUNTER — Ambulatory Visit: Payer: Medicaid Other | Admitting: Obstetrics and Gynecology

## 2022-01-20 ENCOUNTER — Other Ambulatory Visit: Payer: Self-pay

## 2022-01-20 ENCOUNTER — Ambulatory Visit: Payer: Medicaid Other | Admitting: Obstetrics & Gynecology

## 2022-02-21 ENCOUNTER — Ambulatory Visit: Payer: Medicaid Other | Admitting: Internal Medicine

## 2022-02-27 ENCOUNTER — Telehealth (INDEPENDENT_AMBULATORY_CARE_PROVIDER_SITE_OTHER): Payer: Medicaid Other | Admitting: Psychiatry

## 2022-02-27 ENCOUNTER — Encounter (HOSPITAL_COMMUNITY): Payer: Self-pay | Admitting: Psychiatry

## 2022-02-27 DIAGNOSIS — F411 Generalized anxiety disorder: Secondary | ICD-10-CM | POA: Diagnosis not present

## 2022-02-27 DIAGNOSIS — F314 Bipolar disorder, current episode depressed, severe, without psychotic features: Secondary | ICD-10-CM

## 2022-02-27 MED ORDER — SERTRALINE HCL 25 MG PO TABS: 25.0000 mg | ORAL_TABLET | Freq: Every day | ORAL | 3 refills | Status: DC

## 2022-02-27 MED ORDER — HALOPERIDOL 1 MG PO TABS: 2.0000 mg | ORAL_TABLET | Freq: Every day | ORAL | 3 refills | Status: AC

## 2022-02-27 NOTE — Progress Notes (Signed)
Theresa Mccoy/PA/NP OP Progress Note Virtual Visit via Telephone Note  I connected with Theresa Mccoy on 02/27/22 at  2:30 PM EDT by telephone and verified that I am speaking with the correct person using two identifiers.  Location: Patient: home Provider: Clinic   I discussed the limitations, risks, security and privacy concerns of performing an evaluation and management service by telephone and the availability of in person appointments. I also discussed with the patient that there may be a patient responsible charge related to this service. The patient expressed understanding and agreed to proceed.   I provided 30 minutes of non-face-to-face time during this encounter.  02/27/2022 3:00 PM Theresa Mccoy  MRN:  [id number]  Chief Complaint: "My depression screening was high at my son's doctor's appointment"  HPI: 41 year old female seen today for follow-up psychiatric evaluation.  She has a psychiatric history of bipolar disorder, depression, and anxiety.  Currently she is managed on Haldol 2 mg nightly.  Patient reports she finds her medications somewhat effective in managing her psychiatric conditions.  Today she was unable to logon virtually so assessment is done over the phone.  During exam she was pleasant, cooperative, and engaged in conversation.  She informed Probation officer that a few weeks ago she took her 26-monthyear-old son to his pediatrician appointment.  While there she reports that she did take a PHQ-9 and notes that her score was 5.  She informed wProbation officerthat her son's pediatrician recommended that he talk to wProbation officerabout medication adjustments.  Patient informed wProbation officerthat she has been down recently.  She cannot identify a trigger.  She does note that her anxiety is minimal.  At times she reports that she worries about her 3 children but notes that she is able to cope with it.  Patient notes that her sleep is poor.  She reports sleeping 4 to 5 hours nightly.  She endorses having a fluctuation  in appetite but denies weight gain/weight loss.  Patient notes that recently she thought she hallucinated.  She notes that she saw a spot on her son's leg and they noticed that it was gone.  She informed that this only happened on one occasion.  Today she denies SI/HI/VAH, mania, paranoia.    Today patient agreeable to starting Zoloft 25 mg daily to help manage depression and anxiety.  Potential side effects of medication and risks vs benefits of treatment vs non-treatment were explained and discussed. All questions were answered.She will continue Haldol as prescribed.  Provider informed patient that if hallucinations present Haldol could be increased.  She endorsed understanding and agreed.  No other concerns noted at this time.    Visit Diagnosis:    ICD-10-CM   1. Generalized anxiety disorder  F41.1 sertraline (ZOLOFT) 25 MG tablet    2. Severe bipolar I disorder, current or most recent episode depressed ([place]  F31.4 haloperidol (HALDOL) 1 MG tablet      Past Psychiatric History: bipolar disorder, depression, and anxiety  Past Medical History:  Past Medical History:  Diagnosis Date   AKI ([place] kidney injury) ([place] 01/22/2020   Anemia    Depression    Essential hypertension 02/02/2017   Gestational diabetes    Headache    History of kidney stones    Hypernatremia 01/22/2020   Hyperthyroidism    Hypothyroidism    PTSD (post-traumatic stress disorder)    SBO ([place] obstruction) ([place] 07/07/2019   Sepsis ([place] 01/22/2020    Past Surgical History:  Procedure Laterality Date  BOWEL RESECTION N/A 07/08/2019   Procedure: Small Bowel Resection;  Surgeon: Clovis Riley, Mccoy;  Location: [place];  Service: General;  Laterality: N/A;   CESAREAN SECTION     CESAREAN SECTION N/A 12/11/2021   Procedure: CESAREAN SECTION;  Surgeon: Florian Buff, Mccoy;  [facility];  Service: Obstetrics;  Laterality: N/A;   CYSTOSCOPY/URETEROSCOPY/HOLMIUM LASER/STENT PLACEMENT Right 12/23/2021    Procedure: CYSTOSCOPY/RETROGRADE/URETEROSCOPY/HOLMIUM LASER/STENT PLACEMENT/ NEPHROSTOMY TUBE REMOVAL;  Surgeon: Janith Lima, Mccoy;  [facility];  Service: Urology;  Laterality: Right;   DILATION AND CURETTAGE OF UTERUS     HAND SURGERY Left    IR NEPHROSTOMY EXCHANGE RIGHT  10/25/2021   IR NEPHROSTOMY PLACEMENT RIGHT  09/27/2021   IR PATIENT EVAL TECH 0-60 MINS  12/20/2021   LAPAROTOMY N/A 07/08/2019   Procedure: EXPLORATORY LAPAROTOMY;  Surgeon: Clovis Riley, Mccoy;  Location: [place];  Service: General;  Laterality: N/A;   OOPHORECTOMY Right    PARATHYROIDECTOMY Right 09/13/2021   Procedure: RIGHT INFERIOR PARATHYROIDECTOMY;  Surgeon: Armandina Gemma, Mccoy;  Location: [place];  Service: General;  Laterality: Right;   SALPINGECTOMY      Family Psychiatric History: Notes that he cousin has mental health conditions however she is unsure of which one  Family History:  Family History  Problem Relation Age of Onset   Diabetes Brother    Asthma Paternal Grandmother     Social History:  Social History   Socioeconomic History   Marital status: Married    Spouse name: Not on file   Number of children: 2   Years of education: Not on file   Highest education level: GED or equivalent  Occupational History   Occupation: Unemployed  Tobacco Use   Smoking status: Some Days    Packs/day: 0.25    Types: Cigarettes   Smokeless tobacco: Never  Vaping Use   Vaping Use: Never used  Substance and Sexual Activity   Alcohol use: 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  She also stated that she is unemployed.   Social Determinants of Health    Financial Resource Strain: High Risk (10/29/2021)   Overall Financial Resource Strain (CARDIA)    Difficulty of Paying Living Expenses: Very hard  Food Insecurity: Food Insecurity Present (11/25/2021)   Hunger Vital Sign    Worried About Running Out of Food in the Last Year: Sometimes true    Ran Out of Food in the Last Year: Sometimes true  Transportation Needs: Unmet Transportation Needs (11/25/2021)   PRAPARE - Transportation    Lack of Transportation (Medical): Yes    Lack of Transportation (Non-Medical): Yes  Physical Activity: Inactive (10/29/2021)   Exercise Vital Sign    Days of Exercise per Week: 0 days    Minutes of Exercise per Session: 0 min  Stress: Stress Concern Present (10/29/2021)   [place]    Feeling of Stress : Very much  Social Connections: Socially Isolated (10/29/2021)   Social Connection and Isolation Panel [NHANES]    Frequency of Communication  with Friends and Family: Once a week    Frequency of Social Gatherings with Friends and Family: Never    Attends Religious Services: Never    Marine scientist or Organizations: No    Attends Archivist Meetings: Never    Marital Status: Married    Allergies:  Allergies  Allergen Reactions   Codeine Other (See Comments)    Constipation     Metabolic Disorder Labs: Lab Results  Component Value Date   HGBA1C 4.9 12/19/2021   MPG 93.93 12/19/2021   No results found for: "PROLACTIN" No results found for: "CHOL", "TRIG", "HDL", "CHOLHDL", "VLDL", "LDLCALC" Lab Results  Component Value Date   TSH 1.630 11/11/2021   TSH 0.04 (L) 10/07/2021    Therapeutic Level Labs: No results found for: "LITHIUM" No results found for: "VALPROATE" No results found for: "CBMZ"  Current Medications: Current Outpatient Medications  Medication Sig Dispense Refill   sertraline (ZOLOFT) 25 MG tablet Take 1 tablet (25 mg total) by mouth daily.  30 tablet 3   acetaminophen (TYLENOL) 325 MG tablet Take 2 tablets (650 mg total) by mouth every 6 (six) hours as needed for moderate pain. 30 tablet 2   amLODipine (NORVASC) 10 MG tablet Take 1 tablet (10 mg total) by mouth daily. 30 tablet 3   Cholecalciferol (VITAMIN D3) 50 MCG (2000 UT) capsule Take 1 capsule (2,000 Units total) by mouth daily. 90 capsule 3   docusate sodium (COLACE) 100 MG capsule Take 1 capsule (100 mg total) by mouth daily as needed for up to 30 doses. 30 capsule 0   furosemide (LASIX) 20 MG tablet Take 1 tablet (20 mg total) by mouth daily. 4 tablet 0   haloperidol (HALDOL) 1 MG tablet Take 2 tablets (2 mg total) by mouth at bedtime. 60 tablet 3   ibuprofen (ADVIL) 600 MG tablet Take 1 tablet (600 mg total) by mouth every 6 (six) hours as needed for moderate pain. 30 tablet 0   levothyroxine (SYNTHROID) 50 MCG tablet Take 1 tablet (50 mcg total) by mouth daily at 6 (six) AM. 90 tablet 3   oxyCODONE (OXY IR/ROXICODONE) 5 MG immediate release tablet Take 1-2 tablets (5-10 mg total) by mouth every 4 (four) hours as needed for moderate pain. 30 tablet 0   oxyCODONE-acetaminophen (PERCOCET) 5-325 MG tablet Take 1 tablet by mouth every 4 (four) hours as needed for up to 18 doses for severe pain. 18 tablet 0   polyethylene glycol (MIRALAX / GLYCOLAX) 17 g packet Take 17 g by mouth 2 (two) times daily. 60 each 2   Prenatal 27-1 MG TABS Take 1 tablet by mouth daily. 30 tablet 12   No current facility-administered medications for this visit.     Musculoskeletal: Strength & Muscle Tone:  unable to assess due to telephone visit Gait & Station:  Unable to assess due to telephone visit Patient leans: N/A  Psychiatric Specialty Exam: Review of Systems  currently breastfeeding.There is no height or weight on file to calculate BMI.  General Appearance:  Unable to assess due to telephone visit  Eye Contact:   Able to assess due to telephone visit  Speech:  Clear and Coherent and  Normal Rate  Volume:  Normal  Mood:  Euthymic  Affect:  Appropriate and Congruent  Thought Process:  Coherent, Goal Directed, and Linear  Orientation:  Full (Time, Place, and Person)  Thought Content: WDL and Logical   Suicidal Thoughts:  No  Homicidal Thoughts:  No  Memory:  Immediate;   Good Recent;   Good Remote;   Good  Judgement:  Good  Insight:  Good  Psychomotor Activity:   Unable to assess due to telephone visit  Concentration:  Concentration: Good and Attention Span: Good  Recall:  Good  Fund of Knowledge: Good  Language: Good  Akathisia:   Unable to assess due to telephone visit  Handed:  Right  AIMS (if indicated): not done  Assets:  Communication Skills Desire for Improvement Financial Resources/Insurance Housing Intimacy Leisure Time Physical Health Social Support  ADL's:  Intact  Cognition: WNL  Sleep:  Fair   Screenings: AIMS    Flowsheet Row Admission (Discharged) from 10/06/2018 in [facility]  AIMS Total Score 0      AUDIT    Flowsheet Row Admission (Discharged) from 10/06/2018 in [facility]  Alcohol Use Disorder Identification Test Final Score (AUDIT) 0      GAD-7    Flowsheet Row Video Visit from 02/27/2022 in [facility] Counselor from 12/26/2021 in [facility] Routine Prenatal from 12/03/2021 in [facility] [place] [facility] Video Visit from 11/27/2021 in [facility] Routine Prenatal from 11/25/2021 in [place] [facility] [place] [facility]  Total GAD-7 Score 5 15 0 0 0      PHQ2-9    Flowsheet Row Video Visit from 02/27/2022 in [facility] Counselor from 12/26/2021 in [facility] Routine Prenatal from 12/03/2021 in Center for [place] at [facility]  Video Visit from 11/27/2021 in [facility] Routine Prenatal from 11/25/2021 in [place] for [place] at [facility]  PHQ-2 Total Score '4 5 1 1 1  '$ PHQ-9 Total Score '13 20 1 8 1      '$ Flowsheet Row Pre-Admission Testing 60 from 12/18/2021 in [place] Admission (Discharged) from 12/11/2021 in [place] [facility] Baby [facility] ED to Hosp-Admission (Discharged) from 12/09/2021 in [place] [facility] Unit  C-SSRS RISK CATEGORY No Risk No Risk No Risk        Assessment and Plan: Patient endorses mild depression and insomnia.Today patient agreeable to starting Zoloft 25 mg daily to help manage depression and anxiety.  She will continue Haldol as prescribed.  Provider informed patient that if hallucinations present Haldol could be increased.  She endorsed understanding and agreed  1. Severe bipolar I disorder, current or most recent episode depressed (H[facility])  Continue- haloperidol (HALDOL) 1 MG tablet; Take 2 tablets (2 mg total) by mouth at bedtime.  Dispense: 60 tablet; Refill: 3  2. Generalized anxiety disorder  Start- sertraline (ZOLOFT) 25 MG tablet; Take 1 tablet (25 mg total) by mouth daily.  Dispense: 30 tablet; Refill: 3  Follow-up in 3 months Follow-up with therapy  Salley Slaughter, NP 02/27/2022, 3:00 PM

## 2022-04-21 ENCOUNTER — Ambulatory Visit: Payer: Medicaid Other | Admitting: Urology

## 2022-05-16 ENCOUNTER — Encounter (HOSPITAL_COMMUNITY): Payer: Self-pay | Admitting: Psychiatry

## 2022-05-16 ENCOUNTER — Telehealth (INDEPENDENT_AMBULATORY_CARE_PROVIDER_SITE_OTHER): Payer: Medicaid Other | Admitting: Psychiatry

## 2022-05-16 DIAGNOSIS — F314 Bipolar disorder, current episode depressed, severe, without psychotic features: Secondary | ICD-10-CM

## 2022-05-16 DIAGNOSIS — F411 Generalized anxiety disorder: Secondary | ICD-10-CM

## 2022-05-16 MED ORDER — SERTRALINE HCL 50 MG PO TABS: 50.0000 mg | ORAL_TABLET | Freq: Every day | ORAL | 3 refills | Status: AC

## 2022-05-16 MED ORDER — HALOPERIDOL DECANOATE 100 MG/ML IM SOLN: 50.0000 mg | INTRAMUSCULAR | Status: AC

## 2022-05-16 MED ORDER — HALOPERIDOL DECANOATE 50 MG/ML IM SOLN: 50.0000 mg | INTRAMUSCULAR | 12 refills | Status: AC

## 2022-05-16 NOTE — Progress Notes (Signed)
BH MD/PA/NP OP Progress Note Virtual Visit via Video Note  I connected with Theresa Mccoy on 05/16/22 at  9:00 AM EST by a video enabled telemedicine application and verified that I am speaking with the correct person using two identifiers.  Location: Patient: Home Provider: Clinic   I discussed the limitations of evaluation and management by telemedicine and the availability of in person appointments. The patient expressed understanding and agreed to proceed.  I provided 30 minutes of non-face-to-face time during this encounter.     05/16/2022 9:50 AM Theresa Mccoy  MRN:  [id number]  Chief Complaint: "I have been down and anxious"  HPI: 41 year old female seen today for follow-up psychiatric evaluation.  She has a psychiatric history of bipolar disorder, depression, and anxiety.  Currently she is managed on Haldol 2 mg nightly and Zoloft 25 mg.  Patient reports she finds her medications somewhat effective in managing her psychiatric conditions.  Today she was well-groomed, pleasant, cooperative, and engaged in conversation.  She informed Probation officer that she has been down and anxious.  At times she notes that she has dark thoughts about her child but notes that she does not want to harm him, herself, or anyone else.  Patient informed Probation officer that she worries about her mental health, her 3 children, and her food stamps which were recently stopped.  Today provider conducted a GAD-7 and patient scored a 10, at her last visit she scored a 5.  Provider also conducted PHQ-9 and patient scored a 16, at her last visit she scored a 13.  Today she denies SI/HI/VAH, mania, paranoia.  She notes that she sleeps 4-5 hours nightly. She endorses having an adequate sleep.  Patient informed writer that at times she forgets to take Haldol and Zoloft.  Provider asked patient if she would be interested in a long-acting injectable.  She notes that she would.  Patient will have her first Haldol D injection on  05/20/2022.  Zoloft was increased to 50 mg to help manage anxiety and depression.  Patient was informed of [facility] DSS caseworker to assist with food stamps.  She notes that she was scheduled an appointment with her.  No other concerns at this time.     Visit Diagnosis:    ICD-10-CM   1. Severe bipolar I disorder, current or most recent episode depressed (HCC)  F31.4 haloperidol decanoate (HALDOL DECANOATE) 100 MG/ML injection 50 mg    haloperidol decanoate (HALDOL DECANOATE) 50 MG/ML injection    2. Generalized anxiety disorder  F41.1 sertraline (ZOLOFT) 50 MG tablet      Past Psychiatric History: bipolar disorder, depression, and anxiety  Past Medical History:  Past Medical History:  Diagnosis Date   AKI (acute kidney injury) ([place]) 01/22/2020   Anemia    Depression    Essential hypertension 02/02/2017   Gestational diabetes    Headache    History of kidney stones    Hypernatremia 01/22/2020   Hyperthyroidism    Hypothyroidism    PTSD (post-traumatic stress disorder)    SBO (small bowel obstruction) ([place]) 07/07/2019   Sepsis ([place]) 01/22/2020    Past Surgical History:  Procedure Laterality Date   BOWEL RESECTION N/A 07/08/2019   Procedure: Small Bowel Resection;  Surgeon: Clovis Riley, MD;  Location: [facility];  Service: General;  Laterality: N/A;   CESAREAN SECTION     CESAREAN SECTION N/A 12/11/2021   Procedure: CESAREAN SECTION;  Surgeon: Florian Buff, MD;  Location: [facility];  Service: Obstetrics;  Laterality:  N/A;   CYSTOSCOPY/URETEROSCOPY/HOLMIUM LASER/STENT PLACEMENT Right 12/23/2021   Procedure: CYSTOSCOPY/RETROGRADE/URETEROSCOPY/HOLMIUM LASER/STENT PLACEMENT/ NEPHROSTOMY TUBE REMOVAL;  Surgeon: Janith Lima, MD;  Location: [facility];  Service: Urology;  Laterality: Right;   DILATION AND CURETTAGE OF UTERUS     HAND SURGERY Left    IR NEPHROSTOMY EXCHANGE RIGHT  10/25/2021   IR NEPHROSTOMY PLACEMENT RIGHT  09/27/2021   IR PATIENT EVAL TECH 0-60 MINS  12/20/2021    LAPAROTOMY N/A 07/08/2019   Procedure: EXPLORATORY LAPAROTOMY;  Surgeon: Clovis Riley, MD;  Location: [facility];  Service: General;  Laterality: N/A;   OOPHORECTOMY Right    PARATHYROIDECTOMY Right 09/13/2021   Procedure: RIGHT INFERIOR PARATHYROIDECTOMY;  Surgeon: Armandina Gemma, MD;  Location: [facility];  Service: General;  Laterality: Right;   SALPINGECTOMY      Family Psychiatric History: Notes that he cousin has mental health conditions however she is unsure of which one  Family History:  Family History  Problem Relation Age of Onset   Diabetes Brother    Asthma Paternal Grandmother     Social History:  Social History   Socioeconomic History   Marital status: Married    Spouse name: Not on file   Number of children: 2   Years of education: Not on file   Highest education level: GED or equivalent  Occupational History   Occupation: Unemployed  Tobacco Use   Smoking status: Some Days    Packs/day: 0.25    Types: Cigarettes   Smokeless tobacco: Never  Vaping Use   Vaping Use: Never used  Substance and Sexual Activity   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  She also stated that she is unemployed.   Social Determinants of Health   Financial Resource Strain: High Risk (10/29/2021)   Overall Financial Resource Strain (CARDIA)    Difficulty of Paying Living Expenses: Very hard  Food Insecurity: Food Insecurity Present (11/25/2021)   Hunger Vital Sign    Worried About Running Out of Food in the Last Year: Sometimes true    Ran Out of Food in the Last Year: Sometimes true  Transportation Needs: Unmet Transportation  Needs (11/25/2021)   PRAPARE - Transportation    Lack of Transportation (Medical): Yes    Lack of Transportation (Non-Medical): Yes  Physical Activity: Inactive (10/29/2021)   Exercise Vital Sign    Days of Exercise per Week: 0 days    Minutes of Exercise per Session: 0 min  Stress: Stress Concern Present (10/29/2021)   [place]    Feeling of Stress : Very much  Social Connections: Socially Isolated (10/29/2021)   Social Connection and Isolation Panel [NHANES]    Frequency of Communication with Friends and Family: Once a week    Frequency of Social Gatherings with Friends and Family: Never    Attends Religious Services: Never    Marine scientist or Organizations: No    Attends Archivist Meetings: Never    Marital Status: Married    Allergies:  Allergies  Allergen  Reactions   Codeine Other (See Comments)    Constipation     Metabolic Disorder Labs: Lab Results  Component Value Date   HGBA1C 4.9 12/19/2021   MPG 93.93 12/19/2021   No results found for: "PROLACTIN" No results found for: "CHOL", "TRIG", "HDL", "CHOLHDL", "VLDL", "LDLCALC" Lab Results  Component Value Date   TSH 1.630 11/11/2021   TSH 0.04 (L) 10/07/2021    Therapeutic Level Labs: No results found for: "LITHIUM" No results found for: "VALPROATE" No results found for: "CBMZ"  Current Medications: Current Outpatient Medications  Medication Sig Dispense Refill   haloperidol decanoate (HALDOL DECANOATE) 50 MG/ML injection Inject 1 mL (50 mg total) into the muscle every 28 (twenty-eight) days. 1 mL 12   acetaminophen (TYLENOL) 325 MG tablet Take 2 tablets (650 mg total) by mouth every 6 (six) hours as needed for moderate pain. 30 tablet 2   amLODipine (NORVASC) 10 MG tablet Take 1 tablet (10 mg total) by mouth daily. 30 tablet 3   Cholecalciferol (VITAMIN D3) 50 MCG (2000 UT) capsule Take 1 capsule (2,000 Units total) by mouth  daily. 90 capsule 3   docusate sodium (COLACE) 100 MG capsule Take 1 capsule (100 mg total) by mouth daily as needed for up to 30 doses. 30 capsule 0   furosemide (LASIX) 20 MG tablet Take 1 tablet (20 mg total) by mouth daily. 4 tablet 0   haloperidol (HALDOL) 1 MG tablet Take 2 tablets (2 mg total) by mouth at bedtime. 60 tablet 3   ibuprofen (ADVIL) 600 MG tablet Take 1 tablet (600 mg total) by mouth every 6 (six) hours as needed for moderate pain. 30 tablet 0   levothyroxine (SYNTHROID) 50 MCG tablet Take 1 tablet (50 mcg total) by mouth daily at 6 (six) AM. 90 tablet 3   oxyCODONE (OXY IR/ROXICODONE) 5 MG immediate release tablet Take 1-2 tablets (5-10 mg total) by mouth every 4 (four) hours as needed for moderate pain. 30 tablet 0   oxyCODONE-acetaminophen (PERCOCET) 5-325 MG tablet Take 1 tablet by mouth every 4 (four) hours as needed for up to 18 doses for severe pain. 18 tablet 0   polyethylene glycol (MIRALAX / GLYCOLAX) 17 g packet Take 17 g by mouth 2 (two) times daily. 60 each 2   Prenatal 27-1 MG TABS Take 1 tablet by mouth daily. 30 tablet 12   sertraline (ZOLOFT) 50 MG tablet Take 1 tablet (50 mg total) by mouth daily. 30 tablet 3   Current Facility-Administered Medications  Medication Dose Route Frequency Provider Last Rate Last Admin   [START ON 05/20/2022] haloperidol decanoate (HALDOL DECANOATE) 100 MG/ML injection 50 mg  50 mg Intramuscular Q30 days Salley Slaughter, NP         Musculoskeletal: Strength & Muscle Tone: within normal limits and Telephone visit Gait & Station:  Unable to assess due to telephone visit Patient leans: N/A  Psychiatric Specialty Exam: Review of Systems  currently breastfeeding.There is no height or weight on file to calculate BMI.  General Appearance: Well Groomed  Eye Contact:  Good  Speech:  Clear and Coherent and Normal Rate  Volume:  Normal  Mood:  Anxious and Depressed  Affect:  Appropriate and Congruent  Thought Process:   Coherent, Goal Directed, and Linear  Orientation:  Full (Time, Place, and Person)  Thought Content: WDL and Logical   Suicidal Thoughts:  No  Homicidal Thoughts:  No  Memory:  Immediate;   Good Recent;   Good Remote;  Good  Judgement:  Good  Insight:  Good  Psychomotor Activity:  Normal  Concentration:  Concentration: Good and Attention Span: Good  Recall:  Good  Fund of Knowledge: Good  Language: Good  Akathisia:  No  Handed:  Right  AIMS (if indicated): not done  Assets:  Communication Skills Desire for Improvement Financial Resources/Insurance Housing Intimacy Leisure Time Physical Health Social Support  ADL's:  Intact  Cognition: WNL  Sleep:  Fair   Screenings: AIMS    Flowsheet Row Admission (Discharged) from 10/06/2018 in [facility]  AIMS Total Score 0      AUDIT    Flowsheet Row Admission (Discharged) from 10/06/2018 in [facility]  Alcohol Use Disorder Identification Test Final Score (AUDIT) 0      GAD-7    Flowsheet Row Video Visit from 05/16/2022 in [facility] Video Visit from 02/27/2022 in [facility] Counselor from 12/26/2021 in [facility] Routine Prenatal from 12/03/2021 in [facility] [place] [facility] Video Visit from 11/27/2021 in [facility]  Total GAD-7 Score '10 5 15 '$ 0 0      PHQ2-9    Flowsheet Row Video Visit from 05/16/2022 in [facility] Video Visit from 02/27/2022 in [facility] Counselor from 12/26/2021 in [facility] Routine Prenatal from 12/03/2021 in [facility] Video Visit from 11/27/2021 in [facility]  PHQ-2 Total Score '4 4 5 1 1  '$ PHQ-9 Total Score '16 13 20 1  8      '$ Flowsheet Row Pre-Admission Testing 60 from 12/18/2021 in [facility] Admission (Discharged) from 12/11/2021 in [facility] ED to Hosp-Admission (Discharged) from 12/09/2021 in [facility]  C-SSRS RISK CATEGORY No Risk No Risk No Risk        Assessment and Plan: Patient endorses anxiety, depression, and insomnia. Patient informed writer that at times she forgets to take Haldol and Zoloft.  Provider asked patient if she would be interested in a long-acting injectable.  She notes that she would.  Patient will have her first Haldol D injection on 05/20/2022.  Zoloft was increased to 50 mg to help manage anxiety and depression.  Patient was informed of [facility] DSS caseworker to assist with food stamps.  She notes that she was scheduled an appointment with her.  1. Generalized anxiety disorder  Increased- sertraline (ZOLOFT) 50 MG tablet; Take 1 tablet (50 mg total) by mouth daily.  Dispense: 30 tablet; Refill: 3  2. Severe bipolar I disorder, current or most recent episode depressed ([place])  Start- haloperidol decanoate (HALDOL DECANOATE) 100 MG/ML injection 50 mg Start- haloperidol decanoate (HALDOL DECANOATE) 50 MG/ML injection; Inject 1 mL (50 mg total) into the muscle every 28 (twenty-eight) days.  Dispense: 1 mL; Refill: 12  Follow-up in 3 months Follow-up with therapy  Salley Slaughter, NP 05/16/2022, 9:50 AM

## 2022-05-20 ENCOUNTER — Ambulatory Visit (HOSPITAL_COMMUNITY): Payer: Medicaid Other

## 2022-05-22 ENCOUNTER — Encounter (HOSPITAL_COMMUNITY): Payer: Self-pay

## 2022-05-22 ENCOUNTER — Telehealth (HOSPITAL_COMMUNITY): Payer: Self-pay | Admitting: *Deleted

## 2022-05-22 ENCOUNTER — Ambulatory Visit (HOSPITAL_COMMUNITY): Payer: Self-pay

## 2022-05-22 NOTE — Telephone Encounter (Signed)
Call to community pharmacy as I can't see any insurance on her chart. I was on hold for 15 min and never got someone to the phone. I spoke with someone at the [facility] and they think its available in the pharmacy but unsure of the price. Called [facility], they have Haldol D 50 mg for 20.00 and a cheaper discount card to access for another line, but would have to process it to quote the price. We can use samples today for initial appt but will need to have a plan for future shots. This med is no longer available on patient assistance. Also, need clarification on her home address as in the chart currently its a [facility] address which we would not serve.

## 2022-05-26 ENCOUNTER — Telehealth (HOSPITAL_COMMUNITY): Payer: Medicaid Other | Admitting: Psychiatry

## 2022-08-12 ENCOUNTER — Ambulatory Visit (HOSPITAL_COMMUNITY): Payer: Self-pay

## 2022-08-12 ENCOUNTER — Encounter (HOSPITAL_COMMUNITY): Payer: Self-pay

## 2024-01-12 ENCOUNTER — Other Ambulatory Visit (HOSPITAL_COMMUNITY): Payer: Self-pay
